# Patient Record
Sex: Female | Born: 1980 | Race: Black or African American | Hispanic: No | Marital: Single | Smoking: Never smoker
Health system: Southern US, Community
[De-identification: ages and names within clinical notes are randomized; demographics above are authoritative.]

## PROBLEM LIST (undated history)

## (undated) DIAGNOSIS — Z923 Personal history of irradiation: Secondary | ICD-10-CM

## (undated) DIAGNOSIS — E079 Disorder of thyroid, unspecified: Secondary | ICD-10-CM

## (undated) DIAGNOSIS — E0590 Thyrotoxicosis, unspecified without thyrotoxic crisis or storm: Secondary | ICD-10-CM

## (undated) DIAGNOSIS — E042 Nontoxic multinodular goiter: Secondary | ICD-10-CM

## (undated) DIAGNOSIS — Z8489 Family history of other specified conditions: Secondary | ICD-10-CM

## (undated) DIAGNOSIS — Z800 Family history of malignant neoplasm of digestive organs: Secondary | ICD-10-CM

## (undated) DIAGNOSIS — K219 Gastro-esophageal reflux disease without esophagitis: Secondary | ICD-10-CM

## (undated) DIAGNOSIS — Z9221 Personal history of antineoplastic chemotherapy: Secondary | ICD-10-CM

## (undated) HISTORY — DX: Thyrotoxicosis, unspecified without thyrotoxic crisis or storm: E05.90

## (undated) HISTORY — DX: Disorder of thyroid, unspecified: E07.9

## (undated) HISTORY — PX: WISDOM TOOTH EXTRACTION: SHX21

## (undated) HISTORY — DX: Family history of malignant neoplasm of digestive organs: Z80.0

---

## 2013-10-03 ENCOUNTER — Other Ambulatory Visit: Payer: Self-pay | Admitting: Internal Medicine

## 2013-10-03 ENCOUNTER — Other Ambulatory Visit (HOSPITAL_BASED_OUTPATIENT_CLINIC_OR_DEPARTMENT_OTHER): Payer: Self-pay | Admitting: Internal Medicine

## 2013-10-03 DIAGNOSIS — M25519 Pain in unspecified shoulder: Secondary | ICD-10-CM

## 2013-10-03 DIAGNOSIS — M25511 Pain in right shoulder: Secondary | ICD-10-CM

## 2013-10-04 ENCOUNTER — Ambulatory Visit (HOSPITAL_BASED_OUTPATIENT_CLINIC_OR_DEPARTMENT_OTHER)
Admission: RE | Admit: 2013-10-04 | Discharge: 2013-10-04 | Disposition: A | Discharge status: Home / Self Care | Payer: 59 | Source: Ambulatory Visit | Attending: Internal Medicine | Admitting: Internal Medicine

## 2013-10-04 ENCOUNTER — Other Ambulatory Visit (HOSPITAL_BASED_OUTPATIENT_CLINIC_OR_DEPARTMENT_OTHER): Payer: Self-pay | Admitting: Internal Medicine

## 2013-10-04 ENCOUNTER — Other Ambulatory Visit (HOSPITAL_BASED_OUTPATIENT_CLINIC_OR_DEPARTMENT_OTHER): Payer: Self-pay

## 2013-10-04 DIAGNOSIS — M25519 Pain in unspecified shoulder: Secondary | ICD-10-CM

## 2013-10-04 DIAGNOSIS — M719 Bursopathy, unspecified: Principal | ICD-10-CM | POA: Insufficient documentation

## 2013-10-04 DIAGNOSIS — M259 Joint disorder, unspecified: Secondary | ICD-10-CM | POA: Insufficient documentation

## 2013-10-04 DIAGNOSIS — R937 Abnormal findings on diagnostic imaging of other parts of musculoskeletal system: Secondary | ICD-10-CM | POA: Insufficient documentation

## 2013-10-04 DIAGNOSIS — M67919 Unspecified disorder of synovium and tendon, unspecified shoulder: Secondary | ICD-10-CM | POA: Insufficient documentation

## 2013-10-06 ENCOUNTER — Other Ambulatory Visit: Payer: Self-pay

## 2013-10-07 ENCOUNTER — Other Ambulatory Visit (HOSPITAL_BASED_OUTPATIENT_CLINIC_OR_DEPARTMENT_OTHER): Payer: 59

## 2013-12-08 ENCOUNTER — Other Ambulatory Visit: Payer: Self-pay | Admitting: Internal Medicine

## 2013-12-08 DIAGNOSIS — N63 Unspecified lump in unspecified breast: Secondary | ICD-10-CM

## 2013-12-11 ENCOUNTER — Ambulatory Visit
Admission: RE | Admit: 2013-12-11 | Discharge: 2013-12-11 | Disposition: A | Discharge status: Home / Self Care | Payer: 59 | Source: Ambulatory Visit | Attending: Internal Medicine | Admitting: Internal Medicine

## 2013-12-11 ENCOUNTER — Encounter (INDEPENDENT_AMBULATORY_CARE_PROVIDER_SITE_OTHER): Payer: Self-pay

## 2013-12-11 ENCOUNTER — Other Ambulatory Visit: Payer: Self-pay | Admitting: Internal Medicine

## 2013-12-11 DIAGNOSIS — N63 Unspecified lump in unspecified breast: Secondary | ICD-10-CM

## 2014-07-03 ENCOUNTER — Other Ambulatory Visit: Payer: Self-pay | Admitting: Obstetrics & Gynecology

## 2014-07-03 DIAGNOSIS — N6489 Other specified disorders of breast: Secondary | ICD-10-CM

## 2014-07-03 DIAGNOSIS — N6009 Solitary cyst of unspecified breast: Secondary | ICD-10-CM

## 2014-07-22 ENCOUNTER — Ambulatory Visit
Admission: RE | Admit: 2014-07-22 | Discharge: 2014-07-22 | Disposition: A | Discharge status: Home / Self Care | Payer: 59 | Source: Ambulatory Visit | Attending: Obstetrics & Gynecology | Admitting: Obstetrics & Gynecology

## 2014-07-22 DIAGNOSIS — N6489 Other specified disorders of breast: Secondary | ICD-10-CM

## 2014-07-22 DIAGNOSIS — N6009 Solitary cyst of unspecified breast: Secondary | ICD-10-CM

## 2015-02-19 ENCOUNTER — Other Ambulatory Visit: Payer: Self-pay | Admitting: Obstetrics & Gynecology

## 2015-02-19 DIAGNOSIS — N6310 Unspecified lump in the right breast, unspecified quadrant: Secondary | ICD-10-CM

## 2015-02-19 DIAGNOSIS — N6489 Other specified disorders of breast: Secondary | ICD-10-CM

## 2015-02-25 ENCOUNTER — Other Ambulatory Visit: Payer: 59

## 2015-03-12 ENCOUNTER — Ambulatory Visit
Admission: RE | Admit: 2015-03-12 | Discharge: 2015-03-12 | Disposition: A | Discharge status: Home / Self Care | Payer: 59 | Source: Ambulatory Visit | Attending: Obstetrics & Gynecology | Admitting: Obstetrics & Gynecology

## 2015-03-12 DIAGNOSIS — N6310 Unspecified lump in the right breast, unspecified quadrant: Secondary | ICD-10-CM

## 2015-03-12 DIAGNOSIS — N6489 Other specified disorders of breast: Secondary | ICD-10-CM

## 2015-07-12 DIAGNOSIS — M8589 Other specified disorders of bone density and structure, multiple sites: Secondary | ICD-10-CM | POA: Diagnosis not present

## 2015-07-12 DIAGNOSIS — E7800 Pure hypercholesterolemia, unspecified: Secondary | ICD-10-CM | POA: Diagnosis not present

## 2015-07-12 DIAGNOSIS — Z79899 Other long term (current) drug therapy: Secondary | ICD-10-CM | POA: Diagnosis not present

## 2015-07-12 DIAGNOSIS — F419 Anxiety disorder, unspecified: Secondary | ICD-10-CM | POA: Diagnosis not present

## 2015-07-12 DIAGNOSIS — E559 Vitamin D deficiency, unspecified: Secondary | ICD-10-CM | POA: Diagnosis not present

## 2015-08-06 MED FILL — VIT D2 1.25 MG (50,000 UNIT: 1.25 MG | 56 days supply | Qty: 8 | Fill #1

## 2015-08-06 MED FILL — IBUPROFEN 800 MG TABLET: 800 | 30 days supply | Qty: 90 | Fill #0

## 2015-10-04 DIAGNOSIS — H5213 Myopia, bilateral: Secondary | ICD-10-CM | POA: Diagnosis not present

## 2015-10-04 DIAGNOSIS — H40013 Open angle with borderline findings, low risk, bilateral: Secondary | ICD-10-CM | POA: Diagnosis not present

## 2016-01-10 DIAGNOSIS — D539 Nutritional anemia, unspecified: Secondary | ICD-10-CM | POA: Diagnosis not present

## 2016-01-10 DIAGNOSIS — R5383 Other fatigue: Secondary | ICD-10-CM | POA: Diagnosis not present

## 2016-01-10 DIAGNOSIS — R0602 Shortness of breath: Secondary | ICD-10-CM | POA: Diagnosis not present

## 2016-01-10 DIAGNOSIS — Z1389 Encounter for screening for other disorder: Secondary | ICD-10-CM | POA: Diagnosis not present

## 2016-01-10 DIAGNOSIS — Z0000 Encounter for general adult medical examination without abnormal findings: Secondary | ICD-10-CM | POA: Diagnosis not present

## 2016-01-10 DIAGNOSIS — R5381 Other malaise: Secondary | ICD-10-CM | POA: Diagnosis not present

## 2016-01-10 DIAGNOSIS — E559 Vitamin D deficiency, unspecified: Secondary | ICD-10-CM | POA: Diagnosis not present

## 2016-01-24 DIAGNOSIS — G4710 Hypersomnia, unspecified: Secondary | ICD-10-CM | POA: Diagnosis not present

## 2016-01-24 DIAGNOSIS — E7800 Pure hypercholesterolemia, unspecified: Secondary | ICD-10-CM | POA: Diagnosis not present

## 2016-01-24 DIAGNOSIS — G4726 Circadian rhythm sleep disorder, shift work type: Secondary | ICD-10-CM | POA: Diagnosis not present

## 2016-01-24 DIAGNOSIS — E559 Vitamin D deficiency, unspecified: Secondary | ICD-10-CM | POA: Diagnosis not present

## 2016-01-27 DIAGNOSIS — R0683 Snoring: Secondary | ICD-10-CM | POA: Diagnosis not present

## 2016-01-27 DIAGNOSIS — G4726 Circadian rhythm sleep disorder, shift work type: Secondary | ICD-10-CM | POA: Diagnosis not present

## 2016-01-27 DIAGNOSIS — F5102 Adjustment insomnia: Secondary | ICD-10-CM | POA: Diagnosis not present

## 2017-05-25 ENCOUNTER — Ambulatory Visit: Payer: BLUE CROSS/BLUE SHIELD | Admitting: Obstetrics & Gynecology

## 2017-05-25 ENCOUNTER — Encounter: Payer: Self-pay | Admitting: Obstetrics & Gynecology

## 2017-05-25 VITALS — BP 110/80

## 2017-05-25 DIAGNOSIS — N898 Other specified noninflammatory disorders of vagina: Secondary | ICD-10-CM

## 2017-05-25 LAB — WET PREP FOR TRICH, YEAST, CLUE

## 2017-05-25 MED ORDER — FLUCONAZOLE 150 MG PO TABS: 150.0000 mg | ORAL_TABLET | Freq: Every day | ORAL | 1 refills | Status: DC

## 2017-05-25 MED ORDER — TINIDAZOLE 500 MG PO TABS: 2.0000 g | ORAL_TABLET | Freq: Every day | ORAL | 0 refills | Status: AC

## 2017-05-25 NOTE — Progress Notes (Addendum)
    Nicole Maddox Aug 24, 1980 [id number]        36 y.o.  G0 Single  RP:  Vaginal itching x 3 days  HPI:  Not sexually active.  LMP normal 05/05/2017.  No pelvic pain.  C/O vaginal itching with vaginal d/c x 3 days.  Started after taking an [employer]  No odor.  No abnormal bleeding.  No UTI Sx.  BMs wnl.    Past medical history,surgical history, problem list, medications, allergies, family history and social history were all reviewed and documented in the EPIC chart.  Directed ROS with pertinent positives and negatives documented in the history of present illness/assessment and plan.  Exam:  Vitals:   05/25/17 1440  BP: 110/80   General appearance:  Normal  Gyn exam:  Vulva normal.  Speculum:  Cervix/vagina normal, but increased d/c Yeast like.  Wet prep done.   Assessment/Plan:  36 y.o. G0P0000   1. Vaginal itching Yeast vaginitis and Bacterial Vaginosis clinically and on Wet prep.  Treat with Tinidazole first, then Fluconazole 150 mg 1 tab daily x 3.  Usage/risks/benefits.  Probiotic tab vaginally as needed recommended.  F/U Annual/Gyn exam.    - WET PREP FOR [place], YEAST, CLUE  Other orders - fluconazole (DIFLUCAN) 150 MG tablet; Take 1 tablet (150 mg total) by mouth daily for 3 days.  Counseling on above issues >50% x 15 minutes.  [name] MD, 2:46 PM 05/25/2017

## 2017-05-25 NOTE — Addendum Note (Signed)
Addended by: [name] on: 05/25/2017 03:10 PM   Modules accepted: Orders

## 2017-05-25 NOTE — Patient Instructions (Addendum)
1. Vaginal itching Yeast vaginitis and Bacterial vaginosis clinically and on Wet prep.  Treat withTinidazole first, then Fluconazole 150 mg 1 tab daily x 3.  Usage/risks/benefits.  Probiotic tab vaginally as needed recommended.  F/U Annual/Gyn exam.    - WET PREP FOR [place], YEAST, CLUE  Other orders - fluconazole (DIFLUCAN) 150 MG tablet; Take 1 tablet (150 mg total) by mouth daily for 3 days.  Nicole Maddox, it was a pleasure seeing you today!  Bacterial Vaginosis Bacterial vaginosis is a vaginal infection that occurs when the normal balance of bacteria in the vagina is disrupted. It results from an overgrowth of certain bacteria. This is the most common vaginal infection among women ages 47-44. Because bacterial vaginosis increases your risk for STIs (sexually transmitted infections), getting treated can help reduce your risk for chlamydia, gonorrhea, herpes, and HIV (human immunodeficiency virus). Treatment is also important for preventing complications in pregnant women, because this condition can cause an early (premature) delivery. What are the causes? This condition is caused by an increase in harmful bacteria that are normally present in small amounts in the vagina. However, the reason that the condition develops is not fully understood. What increases the risk? The following factors may make you more likely to develop this condition:  Having a new sexual partner or multiple sexual partners.  Having unprotected sex.  Douching.  Having an intrauterine device (IUD).  Smoking.  Drug and alcohol abuse.  Taking certain antibiotic medicines.  Being pregnant.  You cannot get bacterial vaginosis from toilet seats, bedding, swimming pools, or contact with objects around you. What are the signs or symptoms? Symptoms of this condition include:  Grey or white vaginal discharge. The discharge can also be watery or foamy.  A fish-like odor with discharge, especially after sexual  intercourse or during menstruation.  Itching in and around the vagina.  Burning or pain with urination.  Some women with bacterial vaginosis have no signs or symptoms. How is this diagnosed? This condition is diagnosed based on:  Your medical history.  A physical exam of the vagina.  Testing a sample of vaginal fluid under a microscope to look for a large amount of bad bacteria or abnormal cells. Your health care provider may use a cotton swab or a small wooden spatula to collect the sample.  How is this treated? This condition is treated with antibiotics. These may be given as a pill, a vaginal cream, or a medicine that is put into the vagina (suppository). If the condition comes back after treatment, a second round of antibiotics may be needed. Follow these instructions at home: Medicines  Take over-the-counter and prescription medicines only as told by your health care provider.  Take or use your antibiotic as told by your health care provider. Do not stop taking or using the antibiotic even if you start to feel better. General instructions  If you have a female sexual partner, tell her that you have a vaginal infection. She should see her health care provider and be treated if she has symptoms. If you have a female sexual partner, he does not need treatment.  During treatment: ? Avoid sexual activity until you finish treatment. ? Do not douche. ? Avoid alcohol as directed by your health care provider. ? Avoid breastfeeding as directed by your health care provider.  Drink enough water and fluids to keep your urine clear or pale yellow.  Keep the area around your vagina and rectum clean. ? Wash the area daily with warm  water. ? Wipe yourself from front to back after using the toilet.  Keep all follow-up visits as told by your health care provider. This is important. How is this prevented?  Do not douche.  Wash the outside of your vagina with warm water only.  Use  protection when having sex. This includes latex condoms and dental dams.  Limit how many sexual partners you have. To help prevent bacterial vaginosis, it is best to have sex with just one partner (monogamous).  Make sure you and your sexual partner are tested for STIs.  Wear cotton or cotton-lined underwear.  Avoid wearing tight pants and pantyhose, especially during summer.  Limit the amount of alcohol that you drink.  Do not use any products that contain nicotine or tobacco, such as cigarettes and e-cigarettes. If you need help quitting, ask your health care provider.  Do not use illegal drugs. Where to find more information:  Centers for Disease Control and Prevention: AppraiserFraud.fi  American Sexual Health Association (ASHA): www.ashastd.org  [place].S. Department of Health and Financial controller, Office on Women's Health: DustingSprays.pl or SecuritiesCard.it Contact a health care provider if:  Your symptoms do not improve, even after treatment.  You have more discharge or pain when urinating.  You have a fever.  You have pain in your abdomen.  You have pain during sex.  You have vaginal bleeding between periods. Summary  Bacterial vaginosis is a vaginal infection that occurs when the normal balance of bacteria in the vagina is disrupted.  Because bacterial vaginosis increases your risk for STIs (sexually transmitted infections), getting treated can help reduce your risk for chlamydia, gonorrhea, herpes, and HIV (human immunodeficiency virus). Treatment is also important for preventing complications in pregnant women, because the condition can cause an early (premature) delivery.  This condition is treated with antibiotic medicines. These may be given as a pill, a vaginal cream, or a medicine that is put into the vagina (suppository). This information is not intended to replace advice given to you by your health care provider. Make  sure you discuss any questions you have with your health care provider. Document Released: 05/22/2005 Document Revised: 09/25/2016 Document Reviewed: 02/05/2016 Elsevier Interactive Patient Education  2018 [employer].   Vaginal Yeast infection, Adult Vaginal yeast infection is a condition that causes soreness, swelling, and redness (inflammation) of the vagina. It also causes vaginal discharge. This is a common condition. Some women get this infection frequently. What are the causes? This condition is caused by a change in the normal balance of the yeast (candida) and bacteria that live in the vagina. This change causes an overgrowth of yeast, which causes the inflammation. What increases the risk? This condition is more likely to develop in:  Women who take antibiotic medicines.  Women who have diabetes.  Women who take birth control pills.  Women who are pregnant.  Women who douche often.  Women who have a weak defense (immune) system.  Women who have been taking steroid medicines for a long time.  Women who frequently wear tight clothing.  What are the signs or symptoms? Symptoms of this condition include:  White, thick vaginal discharge.  Swelling, itching, redness, and irritation of the vagina. The lips of the vagina (vulva) may be affected as well.  Pain or a burning feeling while urinating.  Pain during sex.  How is this diagnosed? This condition is diagnosed with a medical history and physical exam. This will include a pelvic exam. Your health care provider will examine  a sample of your vaginal discharge under a microscope. Your health care provider may send this sample for testing to confirm the diagnosis. How is this treated? This condition is treated with medicine. Medicines may be over-the-counter or prescription. You may be told to use one or more of the following:  Medicine that is taken orally.  Medicine that is applied as a cream.  Medicine that is  inserted directly into the vagina (suppository).  Follow these instructions at home:  Take or apply over-the-counter and prescription medicines only as told by your health care provider.  Do not have sex until your health care provider has approved. Tell your sex partner that you have a yeast infection. That person should go to his or her health care provider if he or she develops symptoms.  Do not wear tight clothes, such as pantyhose or tight pants.  Avoid using tampons until your health care provider approves.  Eat more yogurt. This may help to keep your yeast infection from returning.  Try taking a sitz bath to help with discomfort. This is a warm water bath that is taken while you are sitting down. The water should only come up to your hips and should cover your buttocks. Do this 3-4 times per day or as told by your health care provider.  Do not douche.  Wear breathable, cotton underwear.  If you have diabetes, keep your blood sugar levels under control. Contact a health care provider if:  You have a fever.  Your symptoms go away and then return.  Your symptoms do not get better with treatment.  Your symptoms get worse.  You have new symptoms.  You develop blisters in or around your vagina.  You have blood coming from your vagina and it is not your menstrual period.  You develop pain in your abdomen. This information is not intended to replace advice given to you by your health care provider. Make sure you discuss any questions you have with your health care provider. Document Released: 03/01/2005 Document Revised: 11/03/2015 Document Reviewed: 11/23/2014 Elsevier Interactive Patient Education  2018 [employer].

## 2017-08-31 IMAGING — MG MM DIAG BREAST TOMO BILATERAL
8 series · 8 of 24 positions shown · non-contrast
Comparison: Previous exam(s).

CLINICAL DATA: Six-month follow-up for probably benign asymmetries
within each breast, in order to show 1 year stability.

Also six-month follow-up for probably benign cysts in the right
breast, in order to show 1 year stability as well.
EXAM:
DIGITAL DIAGNOSTIC BILATERAL MAMMOGRAM WITH 3D TOMOSYNTHESIS WITH
CAD
ULTRASOUND RIGHT BREAST

[L CC]
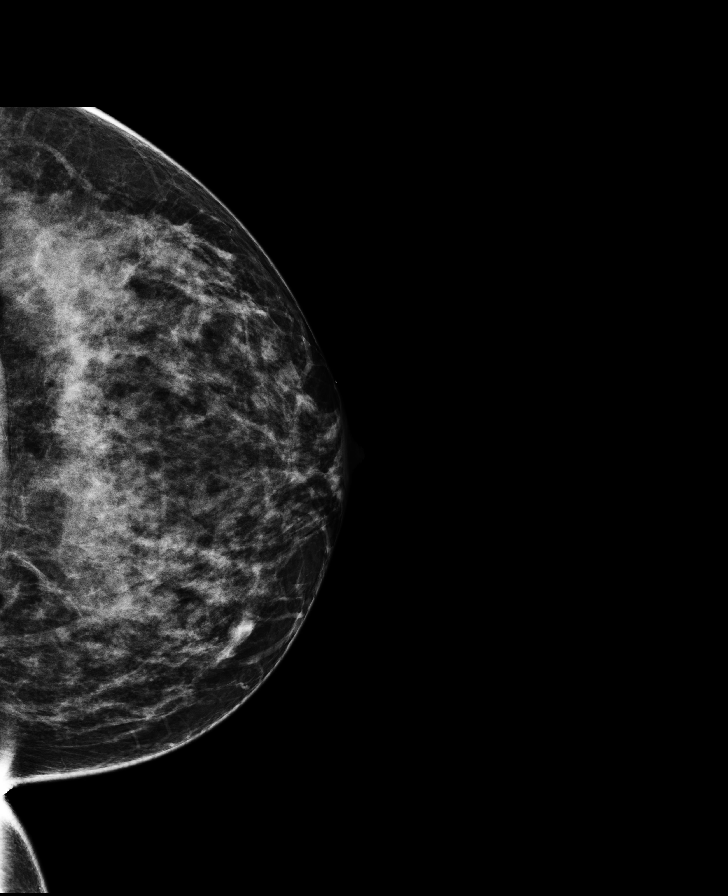

[R MLO]
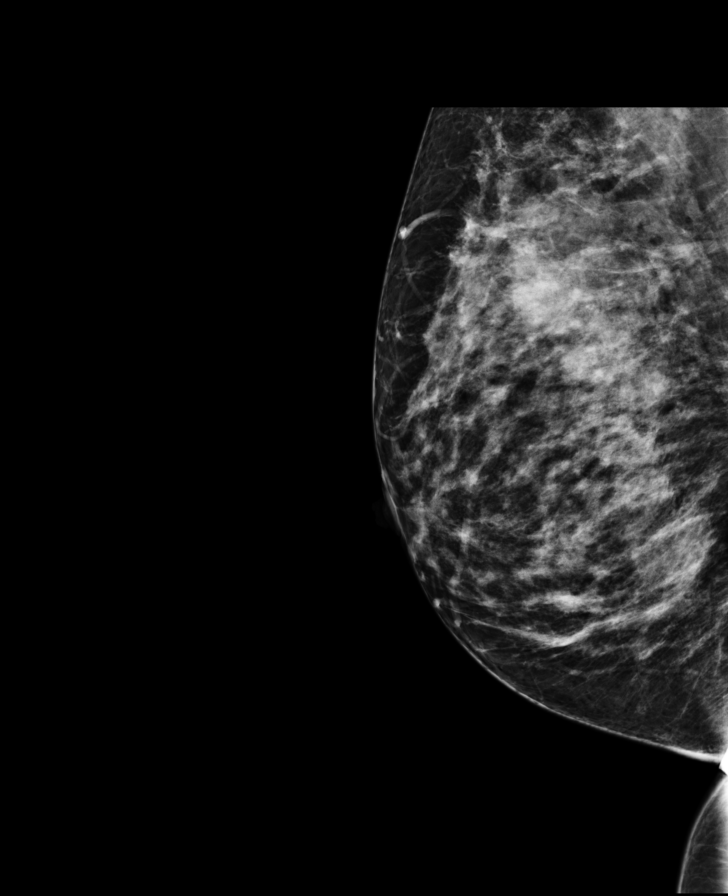

[R CC]
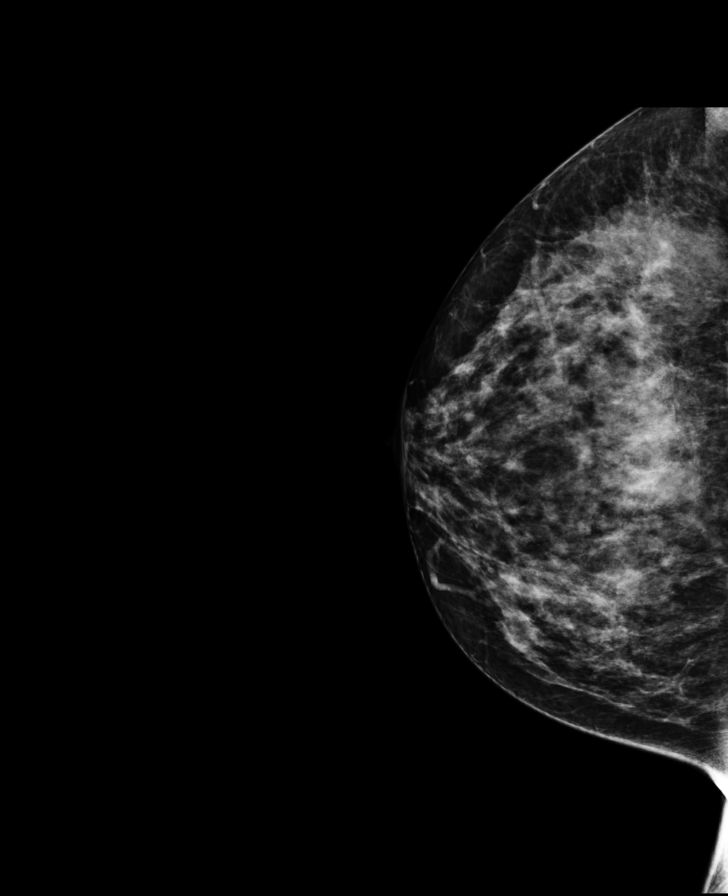

[L MLO]
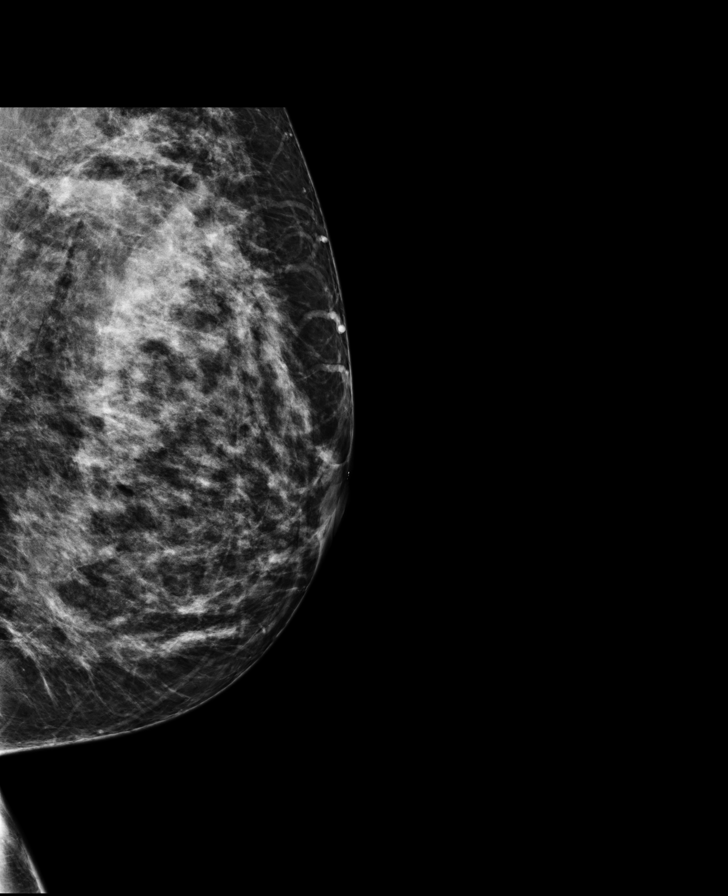

[L CC tomo · tomo slice 44/87.0]
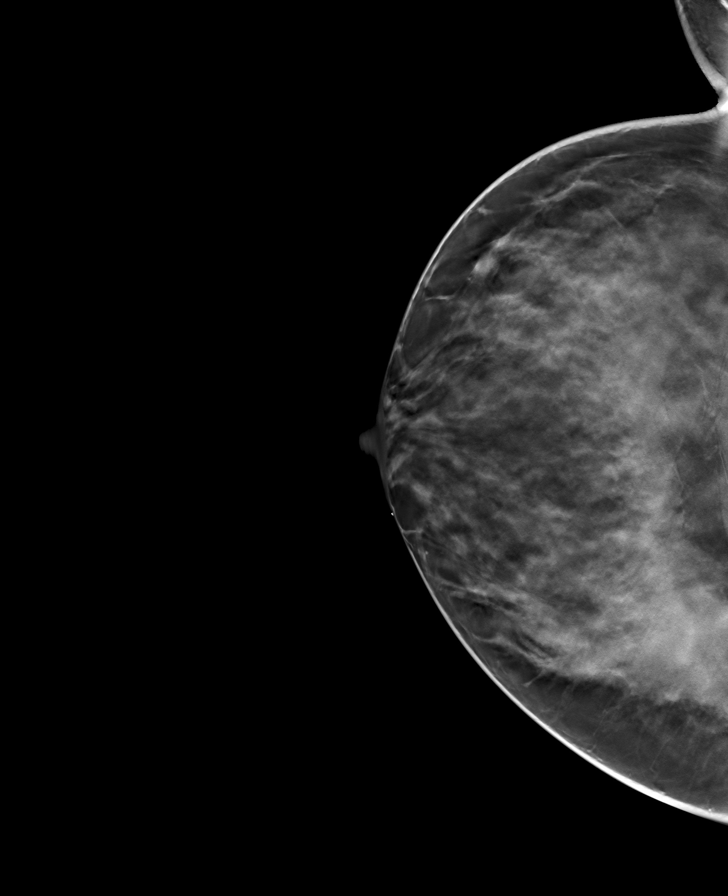

[R MLO tomo · tomo slice 43/84.0]
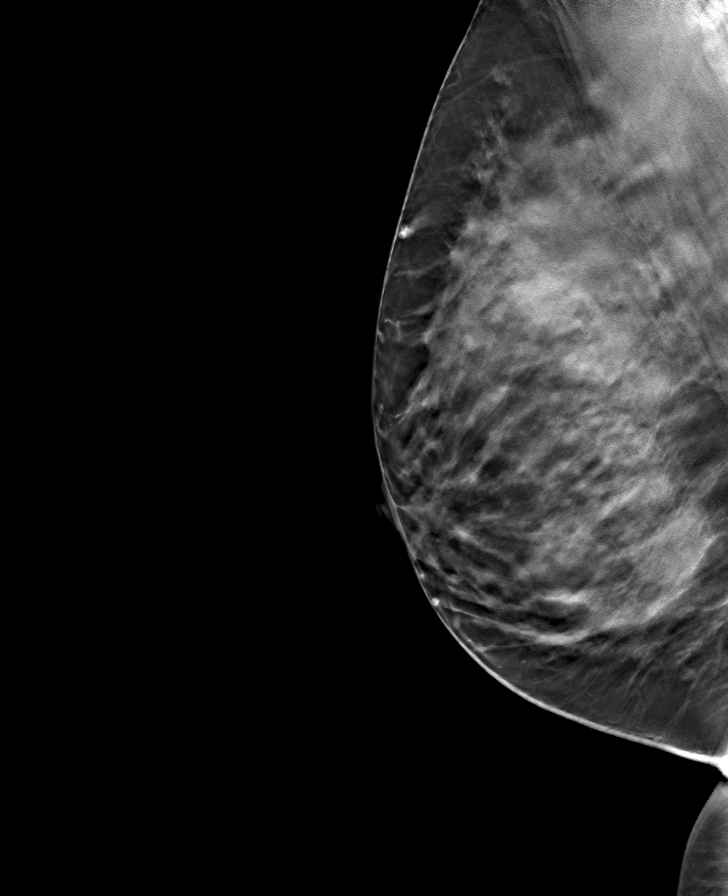

[L MLO tomo · tomo slice 44/87.0]
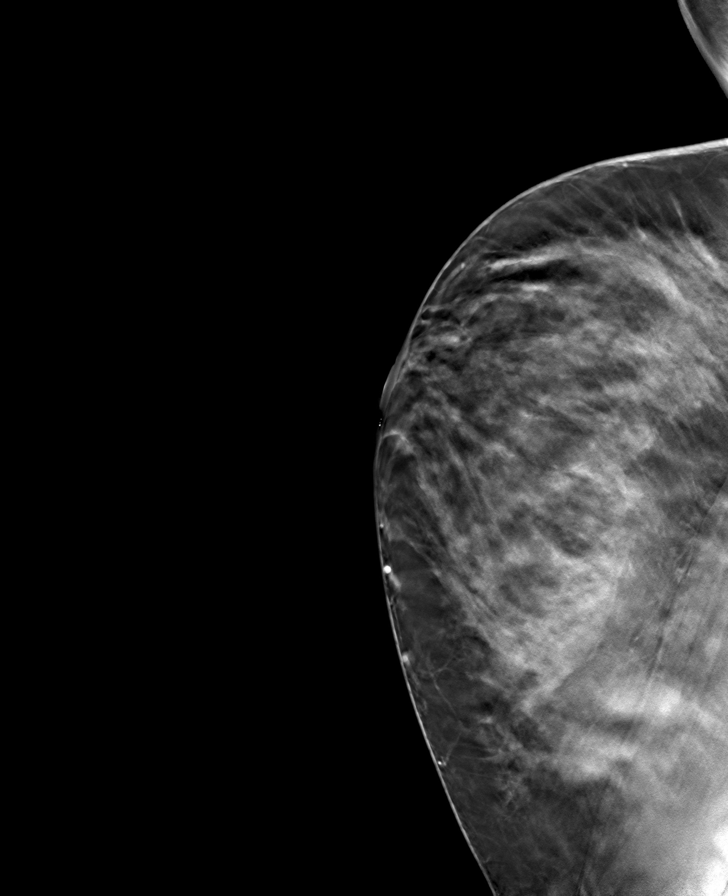

[R CC tomo · tomo slice 45/89.0]
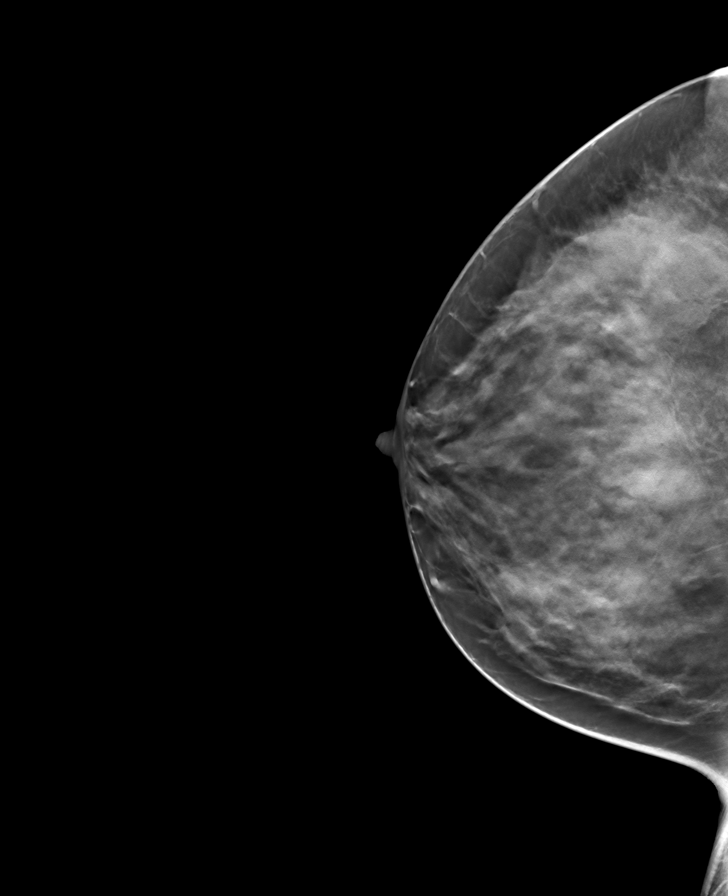

[8 of 24 positions shown; findings below may reference images not displayed]

ACR Breast Density Category c: The breast tissue is heterogeneously
dense, which may obscure small masses.
FINDINGS: There are no new dominant masses, suspicious calcifications or
secondary signs of malignancy identified in either breast. The
previously described bilateral breast asymmetries have an appearance
on today's 3D tomosynthesis that are compatible with superimposition
of normal dense fibroglandular tissues.

Mammographic images were processed with CAD.

Targeted ultrasound is performed, showing a stable benign cluster
cysts within the right breast at the 7 o'clock axis, 5 cm from the
nipple, measuring 1.6 x 0.6 x 0.9 cm on today's study. An additional
benign cluster of cysts is identified at the 9 o'clock axis, 5 cm
from the nipple, measuring 1.4 x 0.7 x 1.1 cm.

Multiple additional benign cysts are seen within the upper inner
quadrant of the right breast, 1-2 o'clock axes, largest measuring
0.8 cm.
IMPRESSION: No mammographic evidence of malignancy within either breast. No
sonographic evidence of malignancy in the right breast.

Multiple benign cysts within the right breast.

RECOMMENDATION:
Screening mammogram at age 40 unless there are persistent or
intervening clinical concerns. (Code:[id number])

I have discussed the findings and recommendations with the patient.
Results were also provided in writing at the conclusion of the
visit. If applicable, a reminder letter will be sent to the patient
regarding the next appointment.

BI-RADS CATEGORY  2: Benign.

## 2018-01-16 ENCOUNTER — Encounter: Payer: BLUE CROSS/BLUE SHIELD | Admitting: Obstetrics & Gynecology

## 2018-02-06 ENCOUNTER — Other Ambulatory Visit: Payer: Self-pay | Admitting: Obstetrics & Gynecology

## 2018-02-06 NOTE — Telephone Encounter (Signed)
CE scheduled 04/02/18 with Dr. Marguerita Merles.

## 2018-02-25 DIAGNOSIS — L219 Seborrheic dermatitis, unspecified: Secondary | ICD-10-CM | POA: Insufficient documentation

## 2018-03-12 ENCOUNTER — Ambulatory Visit (INDEPENDENT_AMBULATORY_CARE_PROVIDER_SITE_OTHER): Payer: BLUE CROSS/BLUE SHIELD | Admitting: Obstetrics & Gynecology

## 2018-03-12 ENCOUNTER — Encounter: Payer: Self-pay | Admitting: Obstetrics & Gynecology

## 2018-03-12 VITALS — BP 126/84

## 2018-03-12 DIAGNOSIS — N898 Other specified noninflammatory disorders of vagina: Secondary | ICD-10-CM | POA: Diagnosis not present

## 2018-03-12 DIAGNOSIS — Z113 Encounter for screening for infections with a predominantly sexual mode of transmission: Secondary | ICD-10-CM

## 2018-03-12 DIAGNOSIS — L292 Pruritus vulvae: Secondary | ICD-10-CM | POA: Diagnosis not present

## 2018-03-12 LAB — WET PREP FOR TRICH, YEAST, CLUE

## 2018-03-12 MED ORDER — FLUCONAZOLE 150 MG PO TABS: 150.0000 mg | ORAL_TABLET | Freq: Every day | ORAL | 3 refills | Status: AC

## 2018-03-12 NOTE — Progress Notes (Signed)
    Nicole Maddox 05-02-1981 [id number]        37 y.o.  G0P0000   RP: Vulvar itching/irritation post [place] wax  HPI: Menstrual periods with light flow every months.  No breakthrough bleeding.  No pelvic pain.  Abstinent.  Complains of vulvar irritation and itching after a [place] wax.  Thick vaginal discharge.  No fever.   OB History  Gravida Para Term Preterm AB Living  0 0 0 0 0 0  SAB TAB Ectopic Multiple Live Births  0 0 0 0 0    Past medical history,surgical history, problem list, medications, allergies, family history and social history were all reviewed and documented in the EPIC chart.   Directed ROS with pertinent positives and negatives documented in the history of present illness/assessment and plan.  Exam:  Vitals:   03/12/18 1457  BP: 126/84   General appearance:  Normal  Abdomen: Normal  Gynecologic exam:  Vulva normal except for vaginal discharge visible at vulva.  Speculum:  Cervix/Vagina normal, but heavy yeasty discharge.  Wet prep done.  Gono-Chlam done on cervix.   Assessment/Plan:  37 y.o. G0   1. Vaginal discharge Severe yeast vaginitis.  Treat with Fluconazole.  Fluconazole 150 mg 1 tablet daily for 3 days prescribed.  Usage reviewed and prescription sent to pharmacy. - WET PREP FOR [place], YEAST, CLUE  2. Vulvar itching No irritation or inflammation secondary to [place] wax at this time.  Vulvar itching probably due to Yeast Vaginitis.  Treatment as above. - WET PREP FOR [place], YEAST, CLUE  3. Screen for STD (sexually transmitted disease) Condom use - Gono-Chlam  Other orders - fluconazole (DIFLUCAN) 150 MG tablet; Take 1 tablet (150 mg total) by mouth daily for 3 days. Will treat with 1 tab daily x 3, then 1 tab weekly x 3.  Counseling on above issues and coordination of care more than 50% for 15 minutes.  [name] MD, 3:03 PM 03/12/2018

## 2018-03-13 LAB — C. TRACHOMATIS/N. GONORRHOEAE RNA
C. TRACHOMATIS RNA, TMA: NOT DETECTED
N. GONORRHOEAE RNA, TMA: NOT DETECTED

## 2018-03-17 ENCOUNTER — Encounter: Payer: Self-pay | Admitting: Obstetrics & Gynecology

## 2018-03-17 NOTE — Patient Instructions (Signed)
1. Vaginal discharge Severe yeast vaginitis.  Treat with Fluconazole.  Fluconazole 150 mg 1 tablet daily for 3 days prescribed.  Usage reviewed and prescription sent to pharmacy. - WET PREP FOR [place], YEAST, CLUE  2. Vulvar itching No irritation or inflammation secondary to [place] wax at this time.  Vulvar itching probably due to Yeast Vaginitis.  Treatment as above. - WET PREP FOR [place], YEAST, CLUE  3. Screen for STD (sexually transmitted disease) Condom use - Gono-Chlam  Other orders - fluconazole (DIFLUCAN) 150 MG tablet; Take 1 tablet (150 mg total) by mouth daily for 3 days. Will treat with 1 tab daily x 3, then 1 tab weekly x 3.  Kriste Basque, good seeing you today!  I will inform you of your results as soon as they are available.

## 2018-04-02 ENCOUNTER — Encounter: Payer: Self-pay | Admitting: Obstetrics & Gynecology

## 2018-04-02 ENCOUNTER — Ambulatory Visit (INDEPENDENT_AMBULATORY_CARE_PROVIDER_SITE_OTHER): Payer: BLUE CROSS/BLUE SHIELD | Admitting: Obstetrics & Gynecology

## 2018-04-02 VITALS — BP 122/80 | Ht 64.0 in | Wt 153.0 lb

## 2018-04-02 DIAGNOSIS — Z01419 Encounter for gynecological examination (general) (routine) without abnormal findings: Secondary | ICD-10-CM | POA: Diagnosis not present

## 2018-04-02 DIAGNOSIS — Z1151 Encounter for screening for human papillomavirus (HPV): Secondary | ICD-10-CM | POA: Diagnosis not present

## 2018-04-02 DIAGNOSIS — Z789 Other specified health status: Secondary | ICD-10-CM

## 2018-04-02 DIAGNOSIS — D219 Benign neoplasm of connective and other soft tissue, unspecified: Secondary | ICD-10-CM

## 2018-04-02 DIAGNOSIS — Z113 Encounter for screening for infections with a predominantly sexual mode of transmission: Secondary | ICD-10-CM

## 2018-04-02 NOTE — Patient Instructions (Signed)
1. Encounter for routine gynecological examination with Papanicolaou smear of cervix Gynecologic exam with probable uterine fibroids.  Pap test/high risk HPV done.  Breast exam normal.  Body mass index 26.26.  Physically active and good nutrition.  2. Use of condoms for contraception  3. Screen for STD (sexually transmitted disease) Strict condom use recommended - Gono-Chlam - HIV antibody (with reflex) - Hepatitis C Antibody - Hepatitis B Surface AntiGEN - RPR  4. Fibroids Uterine fibroids on gynecologic exam.  No pelvic pain and normal menstrual flow.  Will do a pelvic ultrasound to evaluate the size and location of the uterine fibroids, as well as evaluate the ovaries. - US Transvaginal Non-OB; Future  Nicole Maddox, it was a pleasure seeing you today!  I will inform you of your results as soon as they are available.

## 2018-04-02 NOTE — Progress Notes (Signed)
Nicole Maddox [birth year] [id number]   History:    37 y.o. G0 Single  RP:  Established patient presenting for annual gyn exam   HPI: Menses regular normal every month.  Tends to have yeast vaginitis post menses.  Taking Fluconazole as needed.  No pelvic pain.  Using condoms as needed. Would like STI screen. Breasts normal.  BMI 26.26.  Exercises regularly.    Past medical history,surgical history, family history and social history were all reviewed and documented in the EPIC chart.  Gynecologic History Patient's last menstrual period was 03/23/2018. Contraception: condoms Last Pap: 2017. Results were: normal per patient Last mammogram: 03/2015. Results were: Benign Bone Density: Never Colonoscopy: Never  Obstetric History OB History  Gravida Para Term Preterm AB Living  0 0 0 0 0 0  SAB TAB Ectopic Multiple Live Births  0 0 0 0 0     ROS: A ROS was performed and pertinent positives and negatives are included in the history.  GENERAL: No fevers or chills. HEENT: No change in vision, no earache, sore throat or sinus congestion. NECK: No pain or stiffness. CARDIOVASCULAR: No chest pain or pressure. No palpitations. PULMONARY: No shortness of breath, cough or wheeze. GASTROINTESTINAL: No abdominal pain, nausea, vomiting or diarrhea, melena or bright red blood per rectum. GENITOURINARY: No urinary frequency, urgency, hesitancy or dysuria. MUSCULOSKELETAL: No joint or muscle pain, no back pain, no recent trauma. DERMATOLOGIC: No rash, no itching, no lesions. ENDOCRINE: No polyuria, polydipsia, no heat or cold intolerance. No recent change in weight. HEMATOLOGICAL: No anemia or easy bruising or bleeding. NEUROLOGIC: No headache, seizures, numbness, tingling or weakness. PSYCHIATRIC: No depression, no loss of interest in normal activity or change in sleep pattern.     Exam:   BP 122/80 (BP Location: Right Arm, Patient Position: Sitting, Cuff Size: Normal)   Ht 5\' 4"  (1.626 m)   Wt  153 lb (69.4 kg)   LMP 03/23/2018   BMI 26.26 kg/m   Body mass index is 26.26 kg/m.  General appearance : Well developed well nourished female. No acute distress HEENT: Eyes: no retinal hemorrhage or exudates,  Neck supple, trachea midline, no carotid bruits, no thyroidmegaly Lungs: Clear to auscultation, no rhonchi or wheezes, or rib retractions  Heart: Regular rate and rhythm, no murmurs or gallops Breast:Examined in sitting and supine position were symmetrical in appearance, no palpable masses or tenderness,  no skin retraction, no nipple inversion, no nipple discharge, no skin discoloration, no axillary or supraclavicular lymphadenopathy Abdomen: no palpable masses or tenderness, no rebound or guarding Extremities: no edema or skin discoloration or tenderness  Pelvic: Vulva: Normal             Vagina: No gross lesions or discharge  Cervix: No gross lesions or discharge.  Pap/HPV HR/Gono-chlam done  Uterus  AV, mild increase in size and nodular, non-tender and mobile  Adnexa  Without masses or tenderness  Anus: Normal   Assessment/Plan:  37 y.o. female for annual exam   1. Encounter for routine gynecological examination with Papanicolaou smear of cervix Gynecologic exam with probable uterine fibroids.  Pap test/high risk HPV done.  Breast exam normal.  Body mass index 26.26.  Physically active and good nutrition.  2. Use of condoms for contraception  3. Screen for STD (sexually transmitted disease) Strict condom use recommended - Gono-Chlam - HIV antibody (with reflex) - Hepatitis C Antibody - Hepatitis B Surface AntiGEN - RPR  4. Fibroids Uterine fibroids on gynecologic exam.  No pelvic pain and normal menstrual flow.  Will do a pelvic ultrasound to evaluate the size and location of the uterine fibroids, as well as evaluate the ovaries. - US Transvaginal Non-OB; Future  Counseling on above issues and coordination of care more than 50% for 10 minutes.  [name] MD, 12:21 PM 04/02/2018

## 2018-04-03 LAB — RPR: RPR Ser Ql: NONREACTIVE

## 2018-04-03 LAB — PAP IG, CT-NG NAA, HPV HIGH-RISK
C. trachomatis RNA, TMA: NOT DETECTED
HPV DNA High Risk: NOT DETECTED
N. gonorrhoeae RNA, TMA: NOT DETECTED

## 2018-04-03 LAB — HEPATITIS B SURFACE ANTIGEN: Hepatitis B Surface Ag: NONREACTIVE

## 2018-04-03 LAB — HIV ANTIBODY (ROUTINE TESTING W REFLEX): HIV 1&2 Ab, 4th Generation: NONREACTIVE

## 2018-04-03 LAB — HEPATITIS C ANTIBODY
HEP C AB: NONREACTIVE
SIGNAL TO CUT-OFF: 0.01 (ref <1.00)

## 2018-04-23 ENCOUNTER — Other Ambulatory Visit: Payer: Self-pay | Admitting: Obstetrics & Gynecology

## 2018-04-23 ENCOUNTER — Ambulatory Visit: Payer: BLUE CROSS/BLUE SHIELD

## 2018-04-23 ENCOUNTER — Ambulatory Visit (INDEPENDENT_AMBULATORY_CARE_PROVIDER_SITE_OTHER): Payer: BLUE CROSS/BLUE SHIELD | Admitting: Obstetrics & Gynecology

## 2018-04-23 DIAGNOSIS — D219 Benign neoplasm of connective and other soft tissue, unspecified: Secondary | ICD-10-CM

## 2018-04-23 DIAGNOSIS — N838 Other noninflammatory disorders of ovary, fallopian tube and broad ligament: Secondary | ICD-10-CM | POA: Diagnosis not present

## 2018-04-23 DIAGNOSIS — D251 Intramural leiomyoma of uterus: Secondary | ICD-10-CM

## 2018-04-23 DIAGNOSIS — N83202 Unspecified ovarian cyst, left side: Secondary | ICD-10-CM

## 2018-04-23 NOTE — Progress Notes (Signed)
    Nicole Maddox [birth year] [id number]        37 y.o.  G0 single  RP: Fibroids for pelvic ultrasound  HPI: Normal menstrual periods every month with no breakthrough bleeding.  No pelvic pain.  Using condoms for contraception as needed.  Gynecologic exam revealed an enlarged nodular uterus compatible with uterine fibroids.   OB History  Gravida Para Term Preterm AB Living  0 0 0 0 0 0  SAB TAB Ectopic Multiple Live Births  0 0 0 0 0    Past medical history,surgical history, problem list, medications, allergies, family history and social history were all reviewed and documented in the EPIC chart.   Directed ROS with pertinent positives and negatives documented in the history of present illness/assessment and plan.  Exam: Her  There were no vitals filed for this visit. General appearance:  Normal  Pelvic US today: T/V and T/a images.  Anteverted uterus measuring 10.67 x 6.43 x 5.27 cm.  Right intramural fibroid with calcification measuring 3.6 x 2.8 x 2.9 cm.  Endometrial lining normal tri-layered measuring 12.8 mm.  Right ovary normal.  Left ovary with a cluster of 3 echo-free avascular cysts measuring 4.8 x 3.3 x 4.0 cm, 4.4 x 4.1 x 4.1 cm, and 2.1 x 2.0 cm.  Does not appear like a multiloculated cyst, but rather as 3 separate cysts.  No free fluid in the posterior cul-de-sac.   Assessment/Plan:  37 y.o. G0  1. Intramural leiomyoma of uterus Asymptomatic intramural fibroid measuring 3.6 x 2.8 x 2.9 cm.  Patient reassured.  Will observe.  2. Left ovarian cyst Probably 3 simple ovarian cysts on the left ovary measuring from 2.1 to 4.8 cm.  No pelvic pain.  Patient reassured.  Will follow up with a pelvic ultrasound in 3 months to assure stability or regression. - US Transvaginal Non-OB; Future  Counseling on above issues and coordination of care more than 50% for 15 minutes.  [name] MD, 2:42 PM 04/23/2018

## 2018-04-29 ENCOUNTER — Encounter: Payer: Self-pay | Admitting: Obstetrics & Gynecology

## 2018-04-29 NOTE — Patient Instructions (Signed)
1. Intramural leiomyoma of uterus Asymptomatic intramural fibroid measuring 3.6 x 2.8 x 2.9 cm.  Patient reassured.  Will observe.  2. Left ovarian cyst Probably 3 simple ovarian cysts on the left ovary measuring from 2.1 to 4.8 cm.  No pelvic pain.  Patient reassured.  Will follow up with a pelvic ultrasound in 3 months to assure stability or regression. - US Transvaginal Non-OB; Future  Nicole Maddox, it was a pleasure seeing you today!

## 2018-05-16 ENCOUNTER — Other Ambulatory Visit: Payer: Self-pay | Admitting: Obstetrics & Gynecology

## 2018-06-12 ENCOUNTER — Telehealth: Payer: Self-pay | Admitting: *Deleted

## 2018-06-12 NOTE — Telephone Encounter (Signed)
Patient was prescribed diflucan 150 mg tablet directions take once tablet daily x 3 days then once a week. But only 3 tablets were prescribed. How long do you want her to take the 1 table once weekly? Please advise

## 2018-06-13 MED ORDER — FLUCONAZOLE 150 MG PO TABS: ORAL_TABLET | ORAL | 3 refills | Status: DC

## 2018-06-13 NOTE — Telephone Encounter (Signed)
Patient informed. Rx sent 

## 2018-06-13 NOTE — Telephone Encounter (Signed)
Fluconazole weekly x 4.

## 2018-07-24 ENCOUNTER — Other Ambulatory Visit: Payer: BLUE CROSS/BLUE SHIELD

## 2018-07-24 ENCOUNTER — Ambulatory Visit: Payer: BLUE CROSS/BLUE SHIELD | Admitting: Obstetrics & Gynecology

## 2018-08-14 ENCOUNTER — Other Ambulatory Visit: Payer: Self-pay

## 2018-08-14 ENCOUNTER — Encounter: Payer: Self-pay | Admitting: Obstetrics & Gynecology

## 2018-08-14 ENCOUNTER — Ambulatory Visit (INDEPENDENT_AMBULATORY_CARE_PROVIDER_SITE_OTHER): Payer: BLUE CROSS/BLUE SHIELD

## 2018-08-14 ENCOUNTER — Ambulatory Visit (INDEPENDENT_AMBULATORY_CARE_PROVIDER_SITE_OTHER): Payer: BLUE CROSS/BLUE SHIELD | Admitting: Obstetrics & Gynecology

## 2018-08-14 DIAGNOSIS — D251 Intramural leiomyoma of uterus: Secondary | ICD-10-CM | POA: Diagnosis not present

## 2018-08-14 DIAGNOSIS — N83202 Unspecified ovarian cyst, left side: Secondary | ICD-10-CM

## 2018-08-14 NOTE — Progress Notes (Signed)
    Nicole Maddox 1980/11/01 [id number]        38 y.o.  G0P0000 single  RP: Pelvic ultrasound to follow-up on the uterine fibroid and left ovarian cyst  HPI: Patient is completely asymptomatic with normal menstrual periods and no pelvic pain.   OB History  Gravida Para Term Preterm AB Living  0 0 0 0 0 0  SAB TAB Ectopic Multiple Live Births  0 0 0 0 0    Past medical history,surgical history, problem list, medications, allergies, family history and social history were all reviewed and documented in the EPIC chart.   Directed ROS with pertinent positives and negatives documented in the history of present illness/assessment and plan.  Exam:  There were no vitals filed for this visit. General appearance:  Normal  Pelvic US today: T/V and T/a images.  Anteverted uterus measuring 8.88 x 6.63 x 4.7 cm.  Intramural fibroid with no change since last ultrasound in November 2019, showing calcifications measuring 3.1 x 3.3 cm.  Endometrial lining tri-layered normal at 10.6 mm.  Right ovary normal.  Left ovary with 3 echo-free avascular cysts measuring 4.7 x 3.7 x 4.5 cm, 2.1 x 2.0 x 1.9 cm and 4.3 x 4.5 x 3.0 cm, completely stable compared to the ultrasound in November 2019.  Color flow Doppler positive to the ovarian tissue.  No free fluid in the posterior cul-de-sac.   Assessment/Plan:  38 y.o. G0P0000   1. Intramural uterine fibroid Pelvic ultrasound findings reviewed with patient.  Reassured that the intramural fibroid is completely stable compared to the ultrasound in November 2019.  2. Left ovarian cyst 3 simple stable as symptomatic left ovarian cyst.  Patient reassured, will observe.  Follow-up annual gynecologic exam.  Counseling on above issues and coordination of care more than 50% for 15 minutes.  [name] MD, 9:19 AM 08/14/2018

## 2018-08-14 NOTE — Patient Instructions (Signed)
1. Intramural uterine fibroid Pelvic ultrasound findings reviewed with patient.  Reassured that the intramural fibroid is completely stable compared to the ultrasound in November 2019.  2. Left ovarian cyst 3 simple stable as symptomatic left ovarian cyst.  Patient reassured, will observe.  Follow-up annual gynecologic exam.  Nicole Maddox, it was a pleasure seeing you today!

## 2019-01-16 ENCOUNTER — Other Ambulatory Visit: Payer: Self-pay

## 2019-01-17 ENCOUNTER — Encounter: Payer: Self-pay | Admitting: Obstetrics & Gynecology

## 2019-01-17 ENCOUNTER — Ambulatory Visit (INDEPENDENT_AMBULATORY_CARE_PROVIDER_SITE_OTHER): Payer: BC Managed Care – PPO | Admitting: Obstetrics & Gynecology

## 2019-01-17 VITALS — BP 124/78 | Ht 64.0 in | Wt 152.0 lb

## 2019-01-17 DIAGNOSIS — Z01419 Encounter for gynecological examination (general) (routine) without abnormal findings: Secondary | ICD-10-CM | POA: Diagnosis not present

## 2019-01-17 DIAGNOSIS — N930 Postcoital and contact bleeding: Secondary | ICD-10-CM

## 2019-01-17 DIAGNOSIS — Z789 Other specified health status: Secondary | ICD-10-CM | POA: Diagnosis not present

## 2019-01-17 DIAGNOSIS — Z1151 Encounter for screening for human papillomavirus (HPV): Secondary | ICD-10-CM | POA: Diagnosis not present

## 2019-01-17 DIAGNOSIS — D251 Intramural leiomyoma of uterus: Secondary | ICD-10-CM

## 2019-01-17 DIAGNOSIS — Z113 Encounter for screening for infections with a predominantly sexual mode of transmission: Secondary | ICD-10-CM

## 2019-01-17 DIAGNOSIS — N83202 Unspecified ovarian cyst, left side: Secondary | ICD-10-CM | POA: Diagnosis not present

## 2019-01-17 NOTE — Progress Notes (Signed)
Nicole Maddox 09/09/1980 [id number]   History:    38 y.o. G0 Single  RP:  Established patient presenting for annual gyn exam   HPI: Menses every month with normal flow.  No breakthrough bleeding.  No pelvic pain.  Had a pelvic ultrasound March 2020 showing a stable intramural fibroid at 3+ centimeters and 3 simple stable left ovarian cysts.  Sexually active, using condoms.  Had bleeding during intercourse and right after last time she was sexually active 2 days prior to her menstrual period.  No pain with intercourse.  Would like STI screening.  Urine and bowel movements normal.  Breasts normal.  Body mass index 26.09.  Needs to increase physical activities.  Healthy nutrition.  Health labs with family physician.  Past medical history,surgical history, family history and social history were all reviewed and documented in the EPIC chart.  Gynecologic History Patient's last menstrual period was 01/06/2019. Contraception: condoms Last Pap: 03/2018.  Results were: Negative/HPV HR neg.  Gono-Chlam neg. Last mammogram: Never Bone Density: Never Colonoscopy: Never  Obstetric History OB History  Gravida Para Term Preterm AB Living  0 0 0 0 0 0  SAB TAB Ectopic Multiple Live Births  0 0 0 0 0     ROS: A ROS was performed and pertinent positives and negatives are included in the history.  GENERAL: No fevers or chills. HEENT: No change in vision, no earache, sore throat or sinus congestion. NECK: No pain or stiffness. CARDIOVASCULAR: No chest pain or pressure. No palpitations. PULMONARY: No shortness of breath, cough or wheeze. GASTROINTESTINAL: No abdominal pain, nausea, vomiting or diarrhea, melena or bright red blood per rectum. GENITOURINARY: No urinary frequency, urgency, hesitancy or dysuria. MUSCULOSKELETAL: No joint or muscle pain, no back pain, no recent trauma. DERMATOLOGIC: No rash, no itching, no lesions. ENDOCRINE: No polyuria, polydipsia, no heat or cold intolerance. No recent  change in weight. HEMATOLOGICAL: No anemia or easy bruising or bleeding. NEUROLOGIC: No headache, seizures, numbness, tingling or weakness. PSYCHIATRIC: No depression, no loss of interest in normal activity or change in sleep pattern.     Exam:   BP 124/78   Ht 5\' 4"  (1.626 m)   Wt 152 lb (68.9 kg)   LMP 01/06/2019   BMI 26.09 kg/m   Body mass index is 26.09 kg/m.  General appearance : Well developed well nourished female. No acute distress HEENT: Eyes: no retinal hemorrhage or exudates,  Neck supple, trachea midline, no carotid bruits, no thyroidmegaly Lungs: Clear to auscultation, no rhonchi or wheezes, or rib retractions  Heart: Regular rate and rhythm, no murmurs or gallops Breast:Examined in sitting and supine position were symmetrical in appearance, no palpable masses or tenderness,  no skin retraction, no nipple inversion, no nipple discharge, no skin discoloration, no axillary or supraclavicular lymphadenopathy Abdomen: no palpable masses or tenderness, no rebound or guarding Extremities: no edema or skin discoloration or tenderness  Pelvic: Vulva: Normal             Vagina: No gross lesions or discharge  Cervix: No gross lesions or discharge.  Pap/HPV HR, Gono-Chlam done.  Uterus  AV, normal size, shape and consistency, non-tender and mobile  Adnexa  Rt: Without masses or tenderness.  Lt: 4 cm cyst, NT.  Anus: Normal   Assessment/Plan:  38 y.o. female for annual exam   1. Encounter for routine gynecological examination with Papanicolaou smear of cervix Normal gynecologic exam except for possible left ovarian cyst.  Had 3 simple left  ovarian cyst on last pelvic ultrasound March 2020.  Pap test with high-risk HPV done today.  Breast exam normal.  Health labs with family physician.  Body mass index 26.09.  Recommend improving fitness.  2. Screen for STD (sexually transmitted disease) Recommend continued use of condoms. - Gono-Chlam on Pap - HIV antibody (with reflex) -  Hepatitis C Antibody - Hepatitis B Surface AntiGEN - RPR  3. Use of condoms for contraception  4. PCB (post coital bleeding) Bleeding during intercourse and immediately after x1.  We will follow-up with a pelvic ultrasound to rule out endometrial pathology. - US Transvaginal Non-OB; Future  5. Left ovarian cyst Probable persistent left ovarian cyst per exam today.  We will follow-up with a pelvic ultrasound to evaluate. - US Transvaginal Non-OB; Future  6. Intramural uterine fibroid Intramural uterine fibroid which was stable on last ultrasound March 2020 at 3+ centimeters.  Will reevaluate with a pelvic ultrasound at follow-up. - US Transvaginal Non-OB; Future  Counseling on above issues and coordination of care more than 50% for 10 minutes.  [name] MD, 11:43 AM 01/17/2019

## 2019-01-17 NOTE — Patient Instructions (Signed)
1. Encounter for routine gynecological examination with Papanicolaou smear of cervix Normal gynecologic exam except for possible left ovarian cyst.  Had 3 simple left ovarian cyst on last pelvic ultrasound March 2020.  Pap test with high-risk HPV done today.  Breast exam normal.  Health labs with family physician.  Body mass index 26.09.  Recommend improving fitness.  2. Screen for STD (sexually transmitted disease) Recommend continued use of condoms. - Gono-Chlam on Pap - HIV antibody (with reflex) - Hepatitis C Antibody - Hepatitis B Surface AntiGEN - RPR  3. Use of condoms for contraception  4. PCB (post coital bleeding) Bleeding during intercourse and immediately after x1.  We will follow-up with a pelvic ultrasound to rule out endometrial pathology. - US Transvaginal Non-OB; Future  5. Left ovarian cyst Probable persistent left ovarian cyst per exam today.  We will follow-up with a pelvic ultrasound to evaluate. - US Transvaginal Non-OB; Future  6. Intramural uterine fibroid Intramural uterine fibroid which was stable on last ultrasound March 2020 at 3+ centimeters.  Will reevaluate with a pelvic ultrasound at follow-up. - US Transvaginal Non-OB; Future  Augustina, it was a pleasure seeing you today!  I will inform you of your results as soon as they are available.

## 2019-01-17 NOTE — Addendum Note (Signed)
Addended by: Thurnell Garbe A on: 01/17/2019 12:54 PM   Modules accepted: Orders

## 2019-01-20 LAB — RPR: RPR Ser Ql: NONREACTIVE

## 2019-01-20 LAB — HEPATITIS C ANTIBODY
Hepatitis C Ab: NONREACTIVE
SIGNAL TO CUT-OFF: 0.01 (ref <1.00)

## 2019-01-20 LAB — HEPATITIS B SURFACE ANTIGEN: Hepatitis B Surface Ag: NONREACTIVE

## 2019-01-20 LAB — HIV ANTIBODY (ROUTINE TESTING W REFLEX): HIV 1&2 Ab, 4th Generation: NONREACTIVE

## 2019-01-21 LAB — PAP IG, CT-NG NAA, HPV HIGH-RISK
C. trachomatis RNA, TMA: NOT DETECTED
HPV DNA High Risk: NOT DETECTED
N. gonorrhoeae RNA, TMA: NOT DETECTED

## 2019-02-20 ENCOUNTER — Other Ambulatory Visit: Payer: BC Managed Care – PPO

## 2019-02-20 ENCOUNTER — Ambulatory Visit: Payer: BC Managed Care – PPO | Admitting: Obstetrics & Gynecology

## 2019-04-02 ENCOUNTER — Other Ambulatory Visit: Payer: Self-pay

## 2019-04-03 ENCOUNTER — Ambulatory Visit (INDEPENDENT_AMBULATORY_CARE_PROVIDER_SITE_OTHER): Payer: BC Managed Care – PPO

## 2019-04-03 ENCOUNTER — Encounter: Payer: Self-pay | Admitting: Obstetrics & Gynecology

## 2019-04-03 ENCOUNTER — Ambulatory Visit (INDEPENDENT_AMBULATORY_CARE_PROVIDER_SITE_OTHER): Payer: BC Managed Care – PPO | Admitting: Obstetrics & Gynecology

## 2019-04-03 DIAGNOSIS — N83202 Unspecified ovarian cyst, left side: Secondary | ICD-10-CM

## 2019-04-03 DIAGNOSIS — N930 Postcoital and contact bleeding: Secondary | ICD-10-CM | POA: Diagnosis not present

## 2019-04-03 DIAGNOSIS — D251 Intramural leiomyoma of uterus: Secondary | ICD-10-CM

## 2019-04-03 NOTE — Progress Notes (Signed)
    Nicole Maddox 03-24-1981 [id number]        38 y.o.  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  No pelvic pain.  No pain with intercourse.  No recurrence of postcoital bleeding.   OB History  Gravida Para Term Preterm AB Living  0 0 0 0 0 0  SAB TAB Ectopic Multiple Live Births  0 0 0 0 0    Past medical history,surgical history, problem list, medications, allergies, family history and social history were all reviewed and documented in the EPIC chart.   Directed ROS with pertinent positives and negatives documented in the history of present illness/assessment and plan.  Exam:  There were no vitals filed for this visit. General appearance:  Normal  Pelvic US today: T/V images.  Comparison is made with the previous scans on November 2019 and March 2020.  Anteverted uterus normal in size and shape with no change in a 3.4 cm calcified fibroid along the right uterine sidewall.  The uterus is measured at 9.59 x 6.75 x 4.59 cm.  The endometrial lining is symmetrical and measured at 12.34 mm with no mass or thickening seen.  The right ovary is normal.  The left ovary shows 3 simple cysts basically unchanged in size and appearance since previous scans.  The cysts measure 4.7 x 3.6 x 3.4 cm, 4.4 x 3.3 x 2.7 cm, 1.8 x 1.7 x 1.6 cm.  Perfusion to the ovarian tissue is present.  No free fluid is present in the posterior cul-de-sac.   Assessment/Plan:  38 y.o. G0P0000   1. Left ovarian cyst 3 simple benign appearing left ovarian cysts unchanged in size since the pelvic ultrasound in November 2019 and causing no symptoms.  The risk of ovarian cyst rupture, and more importantly the risk of ovarian torsion was thoroughly explained to patient.  Precautions were discussed and patient will call to be evaluated promptly if develops pelvic pain.  Patient prefers observation rather than  laparoscopic Lt ovarian cystectomies at this time.  2. Intramural uterine fibroid Stable calcified intramural fibroid at 3.4 cm.  Patient reassured.  Counseling on above issues and coordination of care more than 50% for 15 minutes.  [name] MD, 3:00 PM 04/03/2019

## 2019-04-03 NOTE — Patient Instructions (Signed)
1. Left ovarian cyst 3 simple benign appearing left ovarian cysts unchanged in size since the pelvic ultrasound in November 2019 and causing no symptoms.  The risk of ovarian cyst rupture, and more importantly the risk of ovarian torsion was thoroughly explained to patient.  Precautions were discussed and patient will call to be evaluated promptly if develops pelvic pain.  Patient prefers observation rather than laparoscopic Lt ovarian cystectomies at this time.  2. Intramural uterine fibroid Stable calcified intramural fibroid at 3.4 cm.  Patient reassured.  Nicole Maddox, it was a pleasure seeing you today!

## 2019-06-30 ENCOUNTER — Other Ambulatory Visit: Payer: Self-pay | Admitting: Obstetrics & Gynecology

## 2020-01-19 ENCOUNTER — Encounter: Payer: BC Managed Care – PPO | Admitting: Obstetrics & Gynecology

## 2020-01-27 ENCOUNTER — Encounter: Payer: BC Managed Care – PPO | Admitting: Obstetrics & Gynecology

## 2020-02-02 ENCOUNTER — Encounter: Payer: BC Managed Care – PPO | Admitting: Obstetrics & Gynecology

## 2020-02-02 DIAGNOSIS — Z0289 Encounter for other administrative examinations: Secondary | ICD-10-CM

## 2020-04-07 ENCOUNTER — Other Ambulatory Visit: Payer: Self-pay

## 2020-04-07 ENCOUNTER — Encounter: Payer: Self-pay | Admitting: Obstetrics & Gynecology

## 2020-04-07 ENCOUNTER — Ambulatory Visit (INDEPENDENT_AMBULATORY_CARE_PROVIDER_SITE_OTHER): Payer: BC Managed Care – PPO | Admitting: Obstetrics & Gynecology

## 2020-04-07 VITALS — BP 110/70 | Ht 64.0 in | Wt 192.0 lb

## 2020-04-07 DIAGNOSIS — Z1151 Encounter for screening for human papillomavirus (HPV): Secondary | ICD-10-CM

## 2020-04-07 DIAGNOSIS — Z789 Other specified health status: Secondary | ICD-10-CM | POA: Diagnosis not present

## 2020-04-07 DIAGNOSIS — Z113 Encounter for screening for infections with a predominantly sexual mode of transmission: Secondary | ICD-10-CM

## 2020-04-07 DIAGNOSIS — Z01419 Encounter for gynecological examination (general) (routine) without abnormal findings: Secondary | ICD-10-CM | POA: Diagnosis not present

## 2020-04-07 DIAGNOSIS — E6609 Other obesity due to excess calories: Secondary | ICD-10-CM

## 2020-04-07 DIAGNOSIS — Z6832 Body mass index (BMI) 32.0-32.9, adult: Secondary | ICD-10-CM

## 2020-04-07 NOTE — Progress Notes (Signed)
Nicole Maddox 1980/11/10 [id number]   History:    39 y.o. G0 Single  RP:  Established patient presenting for annual gyn exam   HPI: Menses every month with normal flow.  No breakthrough bleeding.  No pelvic pain.  Had a pelvic ultrasound March 2020 showing a stable intramural fibroid at 3+ centimeters and 3 simple stable left ovarian cysts.  Sexually active, using condoms.  Had bleeding during intercourse and right after last time she was sexually active 2 days prior to her menstrual period.  No pain with intercourse.  Would like STI screening.  Urine and bowel movements normal.  Breasts normal.  Body mass index 32.96.  Needs to increase physical activities.  Healthy nutrition.  Health labs with family physician.   Past medical history,surgical history, family history and social history were all reviewed and documented in the EPIC chart.  Gynecologic History Patient's last menstrual period was 03/19/2020.  Obstetric History OB History  Gravida Para Term Preterm AB Living  0 0 0 0 0 0  SAB TAB Ectopic Multiple Live Births  0 0 0 0 0     ROS: A ROS was performed and pertinent positives and negatives are included in the history.  GENERAL: No fevers or chills. HEENT: No change in vision, no earache, sore throat or sinus congestion. NECK: No pain or stiffness. CARDIOVASCULAR: No chest pain or pressure. No palpitations. PULMONARY: No shortness of breath, cough or wheeze. GASTROINTESTINAL: No abdominal pain, nausea, vomiting or diarrhea, melena or bright red blood per rectum. GENITOURINARY: No urinary frequency, urgency, hesitancy or dysuria. MUSCULOSKELETAL: No joint or muscle pain, no back pain, no recent trauma. DERMATOLOGIC: No rash, no itching, no lesions. ENDOCRINE: No polyuria, polydipsia, no heat or cold intolerance. No recent change in weight. HEMATOLOGICAL: No anemia or easy bruising or bleeding. NEUROLOGIC: No headache, seizures, numbness, tingling or weakness. PSYCHIATRIC: No  depression, no loss of interest in normal activity or change in sleep pattern.     Exam:   BP 110/70   Ht 5\' 4"  (1.626 m)   Wt 192 lb (87.1 kg)   LMP 03/19/2020 Comment: no birth control   BMI 32.96 kg/m   Body mass index is 32.96 kg/m.  General appearance : Well developed well nourished female. No acute distress HEENT: Eyes: no retinal hemorrhage or exudates,  Neck supple, trachea midline, no carotid bruits, no thyroidmegaly Lungs: Clear to auscultation, no rhonchi or wheezes, or rib retractions  Heart: Regular rate and rhythm, no murmurs or gallops Breast:Examined in sitting and supine position were symmetrical in appearance, no palpable masses or tenderness,  no skin retraction, no nipple inversion, no nipple discharge, no skin discoloration, no axillary or supraclavicular lymphadenopathy Abdomen: no palpable masses or tenderness, no rebound or guarding Extremities: no edema or skin discoloration or tenderness  Pelvic: Vulva: Normal             Vagina: No gross lesions or discharge  Cervix: No gross lesions or discharge.  Pap/HPV HR done.  Uterus  AV, normal size, shape and consistency, non-tender and mobile  Adnexa  Without masses or tenderness  Anus: Normal   Assessment/Plan:  39 y.o. female for annual exam   1. Encounter for routine gynecological examination with Papanicolaou smear of cervix Normal gynecologic exam.  Pap test with high-risk HPV done today.  Breast exam normal.  Health labs with family physician.  2. Use of condoms for contraception  3. Screen for STD (sexually transmitted disease) Recommend continued strict  condom use. - Gono-Chlam on Pap - HIV antibody (with reflex) - RPR - Hepatitis B Surface AntiGEN - Hepatitis C Antibody  4. Class 1 obesity due to excess calories without serious comorbidity with body mass index (BMI) of 32.0 to 32.9 in adult Recommend a lower calorie/carb diet.  Aerobic activities 5 times a week and light weightlifting every  2 days.  [name] MD, 2:13 PM 04/07/2020

## 2020-04-08 ENCOUNTER — Encounter: Payer: Self-pay | Admitting: Obstetrics & Gynecology

## 2020-04-08 LAB — HEPATITIS C ANTIBODY
Hepatitis C Ab: NONREACTIVE
SIGNAL TO CUT-OFF: 0.01 (ref <1.00)

## 2020-04-08 LAB — HEPATITIS B SURFACE ANTIGEN: Hepatitis B Surface Ag: NONREACTIVE

## 2020-04-08 LAB — HIV ANTIBODY (ROUTINE TESTING W REFLEX): HIV 1&2 Ab, 4th Generation: NONREACTIVE

## 2020-04-08 LAB — RPR: RPR Ser Ql: NONREACTIVE

## 2020-04-09 LAB — PAP IG, CT-NG NAA, HPV HIGH-RISK
C. trachomatis RNA, TMA: NOT DETECTED
HPV DNA High Risk: NOT DETECTED
N. gonorrhoeae RNA, TMA: NOT DETECTED

## 2021-12-23 ENCOUNTER — Ambulatory Visit (INDEPENDENT_AMBULATORY_CARE_PROVIDER_SITE_OTHER): Payer: Managed Care, Other (non HMO) | Admitting: Obstetrics & Gynecology

## 2021-12-23 ENCOUNTER — Encounter: Payer: Self-pay | Admitting: Obstetrics & Gynecology

## 2021-12-23 VITALS — BP 110/80 | HR 70 | Resp 16

## 2021-12-23 DIAGNOSIS — N6320 Unspecified lump in the left breast, unspecified quadrant: Secondary | ICD-10-CM

## 2021-12-23 DIAGNOSIS — N6310 Unspecified lump in the right breast, unspecified quadrant: Secondary | ICD-10-CM | POA: Diagnosis not present

## 2021-12-23 NOTE — Progress Notes (Signed)
    Nicole Maddox 1981/05/05 [id number]        41 y.o.  G0   RP: Large Rt breast lump felt x 2 days  HPI: Patient was experiencing GERD which made her touch her chest.  While doing so, she felt a large Rt breast lump.  Mildly tender.  No change in size in 2 days.  Breast skin normal.  No nipple discharge.  No fam h/o breast Ca.   OB History  Gravida Para Term Preterm AB Living  0 0 0 0 0 0  SAB IAB Ectopic Multiple Live Births  0 0 0 0 0    Past medical history,surgical history, problem list, medications, allergies, family history and social history were all reviewed and documented in the EPIC chart.   Directed ROS with pertinent positives and negatives documented in the history of present illness/assessment and plan.  Exam:  Vitals:   12/23/21 1520  BP: 110/80  Pulse: 70  Resp: 16   General appearance:  Normal  Breast exam:  Right breast:  Large Rt breast Mass at upper internal quadrant from 12 to 3 O'Clock measuring about 5 x 6 cm with smooth contours, mobile, non-tender.  No skin change. Axilla Neg.                         Lt breast:  2 x 2 cm mobile nodule with smooth contour at 10 O'Clock.  No skin change.  Axilla Neg.    Assessment/Plan:  41 y.o. G0  1. Masses of both breasts Patient was experiencing GERD which made her touch her chest.  While doing so, she felt a large Rt breast lump.  Mildly tender.  No change in size in 2 days.  Breast skin normal.  No nipple discharge.  No fam h/o breast Ca.  On breast exam:  Large Rt breast Mass at upper internal quadrant from 12 to 3 O'Clock measuring about 5 x 6 cm with smooth contours, mobile, non-tender.  No skin change. Axilla Neg.  Lt breast:  2 x 2 cm mobile nodule with smooth contour at 10 O'Clock.  No skin change.  Axilla Neg.  Both c/w Fibroadenomas.  Findings and plan reviewed with patient who voiced agreement.  R/O other pathologies.  Schedule Bilateral Dx Mammo and Bilateral Breast US.   Other orders -  Cholecalciferol (VITAMIN D3) 1.25 MG (50000 UT) CAPS; Take 1 capsule by mouth once a week. - folic acid (FOLVITE) 1 MG tablet; Take 1 mg by mouth daily. - methimazole (TAPAZOLE) 5 MG tablet; Take 15 mg by mouth every morning. - omeprazole (PRILOSEC) 40 MG capsule; Take 40 mg by mouth daily. (Patient not taking: Reported on 12/23/2021) - tacrolimus (PROTOPIC) 0.1 % ointment; Apply topically. - UNABLE TO FIND; Med Name: hair vitamin   Counseling and management of bilateral breast masses, probable diagnosis of fibroadenomas discussed, documentation reviewed, rule out other pathologies with bilateral diagnostic mammogram and bilateral breast ultrasound, for 25 minutes.  [name] MD, 3:29 PM 12/23/2021

## 2021-12-26 ENCOUNTER — Telehealth: Payer: Self-pay

## 2021-12-26 DIAGNOSIS — N6310 Unspecified lump in the right breast, unspecified quadrant: Secondary | ICD-10-CM

## 2021-12-26 DIAGNOSIS — N6320 Unspecified lump in the left breast, unspecified quadrant: Secondary | ICD-10-CM

## 2021-12-26 NOTE — Telephone Encounter (Signed)
Refer for Bilateral Dx Mammo and Bilateral Breast US Received: 3 days ago Nicole Bruins, MD  [facility] Triage Large Rt breast Mass at upper internal quadrant from 12 to 3 O'Clock measuring about 5 x 6 cm with smooth contours, mobile, non-tender.  No skin change.  Lt breast with a 2 x 2 cm mobile nodule with smooth contour at 10 O'Clock.  No skin change.  Both c/w Fibroadenomas.  R/O other pathologies.  Schedule Bilateral Dx Mammo and Bilateral Breast US.

## 2021-12-26 NOTE — Telephone Encounter (Signed)
Order placed in [facility]. Patient is scheduled for 01/23/22 at 2:40pm at [facility].

## 2021-12-26 NOTE — Telephone Encounter (Signed)
Spoke with patient and informed her of date/time and facility info. Provided her with phone number if she wants to call for earlier appt/cancellation.

## 2022-01-23 ENCOUNTER — Ambulatory Visit
Admission: RE | Admit: 2022-01-23 | Discharge: 2022-01-23 | Disposition: A | Discharge status: Home / Self Care | Payer: Self-pay | Source: Ambulatory Visit | Attending: Obstetrics & Gynecology | Admitting: Obstetrics & Gynecology

## 2022-01-23 ENCOUNTER — Ambulatory Visit: Payer: Managed Care, Other (non HMO) | Admitting: Obstetrics & Gynecology

## 2022-01-23 ENCOUNTER — Ambulatory Visit
Admission: RE | Admit: 2022-01-23 | Discharge: 2022-01-23 | Disposition: A | Discharge status: Home / Self Care | Payer: Managed Care, Other (non HMO) | Source: Ambulatory Visit | Attending: Obstetrics & Gynecology | Admitting: Obstetrics & Gynecology

## 2022-01-23 ENCOUNTER — Other Ambulatory Visit: Payer: Self-pay | Admitting: Obstetrics & Gynecology

## 2022-01-23 DIAGNOSIS — N6320 Unspecified lump in the left breast, unspecified quadrant: Secondary | ICD-10-CM

## 2022-01-23 DIAGNOSIS — N6310 Unspecified lump in the right breast, unspecified quadrant: Secondary | ICD-10-CM

## 2022-01-24 ENCOUNTER — Ambulatory Visit
Admission: RE | Admit: 2022-01-24 | Discharge: 2022-01-24 | Disposition: A | Discharge status: Home / Self Care | Payer: Managed Care, Other (non HMO) | Source: Ambulatory Visit | Attending: Obstetrics & Gynecology | Admitting: Obstetrics & Gynecology

## 2022-01-24 ENCOUNTER — Other Ambulatory Visit: Payer: Self-pay | Admitting: Radiology

## 2022-01-24 ENCOUNTER — Ambulatory Visit
Admission: RE | Admit: 2022-01-24 | Discharge: 2022-01-24 | Disposition: A | Discharge status: Home / Self Care | Payer: Managed Care, Other (non HMO) | Source: Ambulatory Visit | Attending: Radiology | Admitting: Radiology

## 2022-01-24 DIAGNOSIS — N6310 Unspecified lump in the right breast, unspecified quadrant: Secondary | ICD-10-CM

## 2022-03-22 ENCOUNTER — Ambulatory Visit (INDEPENDENT_AMBULATORY_CARE_PROVIDER_SITE_OTHER): Payer: Managed Care, Other (non HMO) | Admitting: Obstetrics & Gynecology

## 2022-03-22 ENCOUNTER — Encounter: Payer: Self-pay | Admitting: Obstetrics & Gynecology

## 2022-03-22 ENCOUNTER — Other Ambulatory Visit (HOSPITAL_COMMUNITY)
Admission: RE | Admit: 2022-03-22 | Discharge: 2022-03-22 | Disposition: A | Discharge status: Home / Self Care | Payer: Managed Care, Other (non HMO) | Source: Ambulatory Visit | Attending: Obstetrics & Gynecology | Admitting: Obstetrics & Gynecology

## 2022-03-22 VITALS — BP 120/78 | HR 84 | Ht 64.25 in | Wt 177.0 lb

## 2022-03-22 DIAGNOSIS — Z789 Other specified health status: Secondary | ICD-10-CM

## 2022-03-22 DIAGNOSIS — N6011 Diffuse cystic mastopathy of right breast: Secondary | ICD-10-CM | POA: Diagnosis not present

## 2022-03-22 DIAGNOSIS — Z01419 Encounter for gynecological examination (general) (routine) without abnormal findings: Secondary | ICD-10-CM | POA: Diagnosis present

## 2022-03-22 DIAGNOSIS — Z113 Encounter for screening for infections with a predominantly sexual mode of transmission: Secondary | ICD-10-CM | POA: Diagnosis present

## 2022-03-22 NOTE — Progress Notes (Signed)
Nicole Maddox 11-27-1980 [id number]   History:    41 y.o. G0 Single   RP:  Established patient presenting for annual gyn exam    HPI: Menses every month with normal flow.  No breakthrough bleeding.  No pelvic pain.  Had a pelvic ultrasound March 2020 showing a stable intramural fibroid at 3+ centimeters. Sexually active, using condoms.  No pain with intercourse.  Pap/HPV HR, Gono-Chlam today.  Would like STI screening. Urine and bowel movements normal.  Breasts normal.  Bilateral Dx mammo/US 01/2022.  Rt breast Bx 01/2022 Benign Fibrocystic disease.  Body mass index improved to 30.15.  Increased physical activities. Healthy nutrition.  Health labs with family physician.  Past medical history,surgical history, family history and social history were all reviewed and documented in the EPIC chart.  Gynecologic History Patient's last menstrual period was 03/14/2022 (exact date).  Obstetric History OB History  Gravida Para Term Preterm AB Living  0 0 0 0 0 0  SAB IAB Ectopic Multiple Live Births  0 0 0 0 0     ROS: A ROS was performed and pertinent positives and negatives are included in the history.  GENERAL: No fevers or chills. HEENT: No change in vision, no earache, sore throat or sinus congestion. NECK: No pain or stiffness. CARDIOVASCULAR: No chest pain or pressure. No palpitations. PULMONARY: No shortness of breath, cough or wheeze. GASTROINTESTINAL: No abdominal pain, nausea, vomiting or diarrhea, melena or bright red blood per rectum. GENITOURINARY: No urinary frequency, urgency, hesitancy or dysuria. MUSCULOSKELETAL: No joint or muscle pain, no back pain, no recent trauma. DERMATOLOGIC: No rash, no itching, no lesions. ENDOCRINE: No polyuria, polydipsia, no heat or cold intolerance. No recent change in weight. HEMATOLOGICAL: No anemia or easy bruising or bleeding. NEUROLOGIC: No headache, seizures, numbness, tingling or weakness. PSYCHIATRIC: No depression, no loss of interest in  normal activity or change in sleep pattern.     Exam:   BP 120/78   Pulse 84   Ht 5' 4.25" (1.632 m)   Wt 177 lb (80.3 kg)   LMP 03/14/2022 (Exact Date)   SpO2 98%   BMI 30.15 kg/m   Body mass index is 30.15 kg/m.  General appearance : Well developed well nourished female. No acute distress HEENT: Eyes: no retinal hemorrhage or exudates,  Neck supple, trachea midline, no carotid bruits, no thyroidmegaly Lungs: Clear to auscultation, no rhonchi or wheezes, or rib retractions  Heart: Regular rate and rhythm, no murmurs or gallops Breast:Examined in sitting and supine position were symmetrical in appearance, no palpable masses or tenderness,  no skin retraction, no nipple inversion, no nipple discharge, no skin discoloration, no axillary or supraclavicular lymphadenopathy Abdomen: no palpable masses or tenderness, no rebound or guarding Extremities: no edema or skin discoloration or tenderness  Pelvic: Vulva: Normal             Vagina: No gross lesions or discharge  Cervix: No gross lesions or discharge.  Pap/HPV HR, Gono-Chlam.  Uterus  AV, normal size, nodular with Fibroid felt along the Rt mid uterus, non-tender and mobile  Adnexa  Without masses or tenderness  Anus: Normal   Assessment/Plan:  41 y.o. female for annual exam   1. Encounter for routine gynecological examination with Papanicolaou smear of cervix Menses every month with normal flow.  No breakthrough bleeding.  No pelvic pain.  Had a pelvic ultrasound March 2020 showing a stable intramural fibroid at 3+ centimeters. Sexually active, using condoms.  No pain with  intercourse.  Pap/HPV HR, Gono-Chlam today.  Would like STI screening. Urine and bowel movements normal.  Breasts normal.  Bilateral Dx mammo/US 01/2022.  Rt breast Bx 01/2022 Benign Fibrocystic disease.  Body mass index improved to 30.15.  Increased physical activities. Healthy nutrition.  Health labs with family physician. - Cytology - PAP( [place])  2.  Use of condoms for contraception  3. Screen for STD (sexually transmitted disease) - HIV antibody (with reflex) - RPR - Hepatitis B Surface AntiGEN - Hepatitis C Antibody - Cytology - PAP( [place]) - Gono-Chlam  4. Fibrocystic disease of right breast Rt Breast Bx 01/2022.  Other orders - FERROCITE 324 MG TABS tablet; Take 1 tablet by mouth daily. - ketoconazole (NIZORAL) 2 % shampoo; Apply topically.   Nicole Bruins MD, 1:46 PM 03/22/2022

## 2022-03-23 LAB — RPR: RPR Ser Ql: NONREACTIVE

## 2022-03-23 LAB — HEPATITIS B SURFACE ANTIGEN: Hepatitis B Surface Ag: NONREACTIVE

## 2022-03-23 LAB — HEPATITIS C ANTIBODY: Hepatitis C Ab: NONREACTIVE

## 2022-03-23 LAB — HIV ANTIBODY (ROUTINE TESTING W REFLEX): HIV 1&2 Ab, 4th Generation: NONREACTIVE

## 2022-03-24 LAB — CYTOLOGY - PAP
Chlamydia: NEGATIVE
Comment: NEGATIVE
Comment: NEGATIVE
Comment: NORMAL
Diagnosis: NEGATIVE
High risk HPV: NEGATIVE
Neisseria Gonorrhea: NEGATIVE

## 2022-03-27 ENCOUNTER — Encounter: Payer: Self-pay | Admitting: Nurse Practitioner

## 2022-03-27 ENCOUNTER — Ambulatory Visit (INDEPENDENT_AMBULATORY_CARE_PROVIDER_SITE_OTHER): Payer: Managed Care, Other (non HMO) | Admitting: Nurse Practitioner

## 2022-03-27 VITALS — BP 102/64 | HR 77

## 2022-03-27 DIAGNOSIS — N9089 Other specified noninflammatory disorders of vulva and perineum: Secondary | ICD-10-CM

## 2022-03-27 DIAGNOSIS — B9689 Other specified bacterial agents as the cause of diseases classified elsewhere: Secondary | ICD-10-CM | POA: Diagnosis not present

## 2022-03-27 DIAGNOSIS — N760 Acute vaginitis: Secondary | ICD-10-CM | POA: Diagnosis not present

## 2022-03-27 LAB — WET PREP FOR TRICH, YEAST, CLUE

## 2022-03-27 MED ORDER — METRONIDAZOLE 500 MG PO TABS: 500.0000 mg | ORAL_TABLET | Freq: Two times a day (BID) | ORAL | 0 refills | Status: DC

## 2022-03-27 NOTE — Progress Notes (Signed)
   Acute Office Visit  Subjective:    Patient ID: Nicole Maddox, female    DOB: 1980/08/10, 41 y.o.   MRN: [id number]   HPI 41 y.o. presents today for vulvar irritation that started a couple of days ago. Used Monistat last night and has some relief of symptoms but still reports burning today. Denies discharge, odor or urinary symptoms. Negative STD screening 03/22/22.    Review of Systems  Constitutional: Negative.   Genitourinary:  Positive for vaginal pain (Vulvar irritation). Negative for difficulty urinating, dysuria, flank pain, frequency, genital sores, urgency and vaginal discharge.       Objective:    Physical Exam Constitutional:      Appearance: Normal appearance.  Genitourinary:    General: Normal vulva.     Vagina: Vaginal discharge (Unable to decern discharge vs medicinal cream) and erythema present.     BP 102/64   Pulse 77   LMP 03/14/2022 (Exact Date)   SpO2 99%  Wt Readings from Last 3 Encounters:  03/22/22 177 lb (80.3 kg)  04/07/20 192 lb (87.1 kg)  01/17/19 152 lb (68.9 kg)        Patient informed chaperone available to be present for breast and/or pelvic exam. Patient has requested no chaperone to be present. Patient has been advised what will be completed during breast and pelvic exam.   Wet prep + clue cells (+ odor)  Assessment & Plan:   Problem List Items Addressed This Visit   None Visit Diagnoses     Bacterial vaginosis    -  Primary   Relevant Medications   metroNIDAZOLE (FLAGYL) 500 MG tablet   Vulvar irritation       Relevant Orders   WET PREP FOR TRICH, YEAST, CLUE      Plan: Wet prep positive for clue cells - Flagyl 500 mg BID x 7 days.      Tamela Gammon DNP, 2:25 PM 03/27/2022

## 2022-06-06 ENCOUNTER — Telehealth: Payer: Self-pay

## 2022-06-06 ENCOUNTER — Ambulatory Visit (INDEPENDENT_AMBULATORY_CARE_PROVIDER_SITE_OTHER): Payer: Managed Care, Other (non HMO) | Admitting: Obstetrics & Gynecology

## 2022-06-06 ENCOUNTER — Encounter: Payer: Self-pay | Admitting: Obstetrics & Gynecology

## 2022-06-06 VITALS — BP 104/70 | HR 72 | Temp 98.3°F | Resp 16

## 2022-06-06 DIAGNOSIS — N6321 Unspecified lump in the left breast, upper outer quadrant: Secondary | ICD-10-CM

## 2022-06-06 DIAGNOSIS — N6320 Unspecified lump in the left breast, unspecified quadrant: Secondary | ICD-10-CM

## 2022-06-06 DIAGNOSIS — R350 Frequency of micturition: Secondary | ICD-10-CM | POA: Diagnosis not present

## 2022-06-06 MED ORDER — SULFAMETHOXAZOLE-TRIMETHOPRIM 800-160 MG PO TABS: 1.0000 | ORAL_TABLET | Freq: Two times a day (BID) | ORAL | 0 refills | Status: AC

## 2022-06-06 NOTE — Progress Notes (Signed)
    Nicole Maddox Sep 10, 1980 [id number]        42 y.o.  [id number]   RP: Lt breast lump and urinary frequency and cloudiness x a week  HPI: Lt breast lump noticed by patient x about a week.  C/O urinary frequency, voiding small amounts of a cloudy urine with odor.  Also noticed a small amount of blood when wiping.  No pelvic pain.  No vaginal itching.  BMs normal.  No fever.   OB History  Gravida Para Term Preterm AB Living  0 0 0 0 0 0  SAB IAB Ectopic Multiple Live Births  0 0 0 0 0    Past medical history,surgical history, problem list, medications, allergies, family history and social history were all reviewed and documented in the EPIC chart.   Directed ROS with pertinent positives and negatives documented in the history of present illness/assessment and plan.  Exam:  Vitals:   06/06/22 1003  BP: 104/70  Pulse: 72  Resp: 16  Temp: 98.3 F (36.8 C)  TempSrc: Oral   General appearance:  Normal  Breast exam:   Rt breast normal.  Rt axilla Neg.                         Lt breast: Mobile, mildly tender mass 3 x 3 cm at 1-2 O'Clock externally.  Let axilla Neg.  CVAT Neg bilaterally  Abdomen: Normal  Gynecologic exam: Vulva normal.  Bimanual exam  U/A: Yellow cloudy, Pro 3+, Nit Positive, WBC >60, RBC 20-30, Bacteria Many.  Pending U. Culture.   Assessment/Plan:  42 y.o. G0   1. Urinary frequency Probable acute cystitis per clinical presentation and U/A.  Pending U. Culture.  Start on Bactrim DS 1 tab PO BID x 3 days.  Usage reviewed, no allergy.  Prescription sent to pharmacy. - Urinalysis,Complete w/RFL Culture  2. Mass of upper outer quadrant of left breast Lt breast lump noticed by patient x about a week.  Lt breast exam with a mobile, mildly tender mass 3 x 3 cm at 1-2 O'Clock externally.  Let axilla Neg.  Schedule a Lt Dx mammo/US.  Patient agrees with the plan.  Other orders - sulfamethoxazole-trimethoprim (BACTRIM DS) 800-160 MG tablet; Take 1 tablet by  mouth 2 (two) times daily for 3 days. - Urine Culture - REFLEXIVE URINE CULTURE   [name] MD, 10:38 AM 06/06/2022

## 2022-06-06 NOTE — Telephone Encounter (Signed)
Nicole Bruins, MD  P [facility] Triage  Schedule Lt Dx mammo/US  Left breast mass at 1-2 O'Clock on outside 3 x 3 cm, mildly tender, mobile.

## 2022-06-07 ENCOUNTER — Telehealth: Payer: Self-pay

## 2022-06-07 NOTE — Telephone Encounter (Signed)
Patient is scheduled with [facility] for Nicole Maddox., Feb 7 at 2:00pm.

## 2022-06-07 NOTE — Telephone Encounter (Signed)
Spoke with patient and informed her of date/time.

## 2022-06-07 NOTE — Telephone Encounter (Signed)
Pt calling to ask about appt for breast, she is seeing in mychart that she has/needs an appt but nothing is actually scheduled. I advised pt that it is probably the orders for her imaging tests that we ordered and were just waiting on [facility] to contact her to schedule. Advised pt if she doesn't hear from them by Friday to call and schedule. Pt voiced understanding.

## 2022-06-08 LAB — URINALYSIS, COMPLETE W/RFL CULTURE
Bilirubin Urine: NEGATIVE
Glucose, UA: NEGATIVE
Hyaline Cast: NONE SEEN /LPF
Nitrites, Initial: POSITIVE — AB
Specific Gravity, Urine: 1.009 (ref 1.001–1.035)
WBC, UA: >=60 /HPF — AB (ref 0–5)
pH: 6 (ref 5.0–8.0)

## 2022-06-08 LAB — URINE CULTURE
MICRO NUMBER:: 14376684
SPECIMEN QUALITY:: ADEQUATE

## 2022-06-08 LAB — CULTURE INDICATED

## 2022-06-15 ENCOUNTER — Ambulatory Visit (INDEPENDENT_AMBULATORY_CARE_PROVIDER_SITE_OTHER): Payer: Managed Care, Other (non HMO) | Admitting: Radiology

## 2022-06-15 VITALS — BP 112/74

## 2022-06-15 DIAGNOSIS — D219 Benign neoplasm of connective and other soft tissue, unspecified: Secondary | ICD-10-CM

## 2022-06-15 DIAGNOSIS — R319 Hematuria, unspecified: Secondary | ICD-10-CM

## 2022-06-15 DIAGNOSIS — N921 Excessive and frequent menstruation with irregular cycle: Secondary | ICD-10-CM

## 2022-06-15 MED ORDER — MYFEMBREE 40-1-0.5 MG PO TABS: 1.0000 | ORAL_TABLET | Freq: Every day | ORAL | 3 refills | Status: DC

## 2022-06-15 NOTE — Progress Notes (Signed)
      Subjective: Nicole Maddox is a 42 y.o. female who complains of bleeding, unsure if it is coming from her urethra or vagina. Her LMP was 05/27/22, heavy. Soaks through size 5 pads in an hour. Hx of fibroid on last u/s 2020. Was treated for UTI 10 days ago, finished all meds, urinary symptoms are resolved.    Review of Systems  All other systems reviewed and are negative.   Past Medical History:  Diagnosis Date   Hyperthyroidism    Thyroid disease       Objective:  Today's Vitals   06/15/22 1346  BP: 112/74   There is no height or weight on file to calculate BMI.   -General: no acute distress -Vulva: without lesions or discharge -Vagina: moderate bright red blood present, no pooling -Cervix: no lesion or discharge, no CMT -Perineum: no lesions -Uterus: Mobile, non tender -Adnexa: no masses or tenderness  Urine dipstick shows positive for RBC's.  Micro exam: 3-10 RBC's per HPF and few+ bacteria.  Chaperone offered and declined.  Assessment:/Plan:   1. Hematuria, unspecified type Reassured menstrual, not urinary - Urinalysis,Complete w/RFL Culture - Pregnancy, urine; negative  2. Fibroid  3. Menorrhagia with irregular cycle  - Relugolix-Estradiol-Norethind (MYFEMBREE) 40-1-0.5 MG TABS; Take 1 tablet by mouth daily.  Dispense: 28 tablet; Refill: 3  Discussed meds in detail, has a history of non compliance with thyoid meds, stressed the importance of taking all medications as prescribed and leaving them in a safe place she will see them daily  Follow up 3 months

## 2022-06-17 LAB — CULTURE INDICATED

## 2022-06-17 LAB — URINE CULTURE
MICRO NUMBER:: 14418705
SPECIMEN QUALITY:: ADEQUATE

## 2022-06-17 LAB — URINALYSIS, COMPLETE W/RFL CULTURE
Bilirubin Urine: NEGATIVE
Glucose, UA: NEGATIVE
Hyaline Cast: NONE SEEN /LPF
Ketones, ur: NEGATIVE
Nitrites, Initial: NEGATIVE
Specific Gravity, Urine: 1.012 (ref 1.001–1.035)
pH: 8.5 — ABNORMAL HIGH (ref 5.0–8.0)

## 2022-06-17 LAB — PREGNANCY, URINE: Preg Test, Ur: NEGATIVE

## 2022-06-21 ENCOUNTER — Other Ambulatory Visit: Payer: Self-pay | Admitting: Obstetrics & Gynecology

## 2022-06-21 ENCOUNTER — Ambulatory Visit
Admission: RE | Admit: 2022-06-21 | Discharge: 2022-06-21 | Disposition: A | Discharge status: Home / Self Care | Payer: Managed Care, Other (non HMO) | Source: Ambulatory Visit | Attending: Obstetrics & Gynecology | Admitting: Obstetrics & Gynecology

## 2022-06-21 DIAGNOSIS — N6320 Unspecified lump in the left breast, unspecified quadrant: Secondary | ICD-10-CM

## 2022-06-21 DIAGNOSIS — R599 Enlarged lymph nodes, unspecified: Secondary | ICD-10-CM

## 2022-06-23 ENCOUNTER — Ambulatory Visit
Admission: RE | Admit: 2022-06-23 | Discharge: 2022-06-23 | Disposition: A | Discharge status: Home / Self Care | Payer: Managed Care, Other (non HMO) | Source: Ambulatory Visit | Attending: Obstetrics & Gynecology | Admitting: Obstetrics & Gynecology

## 2022-06-23 DIAGNOSIS — R599 Enlarged lymph nodes, unspecified: Secondary | ICD-10-CM

## 2022-06-23 DIAGNOSIS — C50919 Malignant neoplasm of unspecified site of unspecified female breast: Secondary | ICD-10-CM

## 2022-06-23 DIAGNOSIS — N6320 Unspecified lump in the left breast, unspecified quadrant: Secondary | ICD-10-CM

## 2022-06-23 HISTORY — DX: Malignant neoplasm of unspecified site of unspecified female breast: C50.919

## 2022-06-23 HISTORY — PX: BREAST BIOPSY: SHX20

## 2022-06-27 DIAGNOSIS — C50412 Malignant neoplasm of upper-outer quadrant of left female breast: Secondary | ICD-10-CM | POA: Insufficient documentation

## 2022-06-28 ENCOUNTER — Ambulatory Visit
Admission: RE | Admit: 2022-06-28 | Discharge: 2022-06-28 | Disposition: A | Discharge status: Home / Self Care | Payer: Managed Care, Other (non HMO) | Source: Ambulatory Visit | Attending: Radiation Oncology | Admitting: Radiation Oncology

## 2022-06-28 ENCOUNTER — Other Ambulatory Visit: Payer: Self-pay

## 2022-06-28 ENCOUNTER — Encounter: Payer: Self-pay | Admitting: Radiation Oncology

## 2022-06-28 VITALS — BP 108/72 | HR 74 | Temp 97.9°F | Resp 18 | Ht 64.0 in | Wt 168.4 lb

## 2022-06-28 DIAGNOSIS — Z171 Estrogen receptor negative status [ER-]: Secondary | ICD-10-CM | POA: Diagnosis not present

## 2022-06-28 DIAGNOSIS — E0590 Thyrotoxicosis, unspecified without thyrotoxic crisis or storm: Secondary | ICD-10-CM | POA: Diagnosis not present

## 2022-06-28 DIAGNOSIS — C50412 Malignant neoplasm of upper-outer quadrant of left female breast: Secondary | ICD-10-CM | POA: Diagnosis present

## 2022-06-28 DIAGNOSIS — Z79899 Other long term (current) drug therapy: Secondary | ICD-10-CM | POA: Diagnosis not present

## 2022-06-28 NOTE — Progress Notes (Signed)
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   HISTORY OF PRESENT ILLNESS: Nicole Maddox is a 42 y.o. female seen at the request of Dr. Marlou Starks for a new diagnosis of left  breast cancer. The patient was noted to have palpable mass in the left breast. Diagnostic work up showed a 2.7 cm mas sin the 1:00 position, and two abnormal appearing axillary nodes. 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 Her axillary tail biopsy was negative for disease. Her cancer was ER positive with weak staining, PR negative, HER2 negative with a Ki 67 of 95%. She's seen to discuss treatment recommendations of her cancer.     PREVIOUS RADIATION THERAPY: No   PAST MEDICAL HISTORY:  Past Medical History:  Diagnosis Date   Hyperthyroidism    Thyroid disease        PAST SURGICAL HISTORY: Past Surgical History:  Procedure Laterality Date   BREAST BIOPSY Left 06/23/2022   [place] LT BREAST BX W LOC DEV 1ST LESION IMG BX SPEC US GUIDE 06/23/2022 [facility] MAMMOGRAPHY   WISDOM TOOTH EXTRACTION       FAMILY HISTORY:  Family History  Problem Relation Age of Onset   Stomach cancer Maternal Aunt    Throat cancer Maternal Uncle    Stomach cancer Maternal Uncle    Heart Problems Maternal Grandmother      SOCIAL HISTORY:  reports that she has never smoked. She has never used smokeless tobacco. She reports current alcohol use. She reports that she does not use drugs. The patient is single and lives in [place]. She is a Licensed conveyancer. She previously lived in [place] which is why she was seeking her medical care  here. She reports she is not planning on future fertility, and enjoys travelling with friends.    ALLERGIES: Patient has no known allergies.   MEDICATIONS:  Current Outpatient Medications  Medication Sig Dispense Refill   FERROCITE 324 MG TABS tablet Take 1 tablet by mouth daily.     folic acid (FOLVITE) 1 MG tablet Take 1 mg by mouth daily.     ketoconazole (NIZORAL) 2 % shampoo Apply topically.     methimazole (TAPAZOLE) 5 MG tablet Take 15 mg by mouth every morning.     Multiple Vitamin (MULTIVITAMIN PO) Take by mouth.     omeprazole (PRILOSEC) 40 MG capsule Take 40 mg by mouth daily.     PREBIOTIC PRODUCT PO Take by mouth.     Relugolix-Estradiol-Norethind (MYFEMBREE) 40-1-0.5 MG TABS Take 1 tablet by mouth daily. 28 tablet 3   No current facility-administered medications for this visit.     REVIEW OF SYSTEMS: On review of systems, the patient reports that she is doing pretty well, but admits she feels like she has not realized the impact and reconciled her diagnosis. She was hoping to keep her diagnosis to herself, but is planning to talk with her family this week about her cancer. No other complaints specific to the breast are noted.      PHYSICAL EXAM:  Wt Readings from Last 3  Encounters:  03/22/22 177 lb (80.3 kg)  04/07/20 192 lb (87.1 kg)  01/17/19 152 lb (68.9 kg)   Temp Readings from Last 3 Encounters:  06/06/22 98.3 F (36.8 C) (Oral)   BP Readings from Last 3 Encounters:  06/15/22 112/74  06/06/22 104/70  03/27/22 102/64   Pulse Readings from Last 3 Encounters:  06/06/22 72  03/27/22 77  03/22/22 84    In general this is a well appearing African American female in no acute distress. She's alert and oriented x4 and appropriate throughout the examination. Cardiopulmonary assessment is negative for acute distress and she exhibits normal effort. Bilateral breast exam is deferred.    ECOG = 0  0 - Asymptomatic (Fully active, able to carry on all  predisease activities without restriction)  1 - Symptomatic but completely ambulatory (Restricted in physically strenuous activity but ambulatory and able to carry out work of a light or sedentary nature. For example, light housework, office work)  2 - Symptomatic, <50% in bed during the day (Ambulatory and capable of all self care but unable to carry out any work activities. Up and about more than 50% of waking hours)  3 - Symptomatic, >50% in bed, but not bedbound (Capable of only limited self-care, confined to bed or chair 50% or more of waking hours)  4 - Bedbound (Completely disabled. Cannot carry on any self-care. Totally confined to bed or chair)  5 - Death   Eustace Pen MM, Creech RH, Tormey DC, et al. [phone]). "Toxicity and response criteria of the [facility]". [place] Oncol. 5 (6): 649-55    LABORATORY DATA:  No results found for: "WBC", "HGB", "HCT", "MCV", "PLT" No results found for: "NA", "K", "CL", "CO2" No results found for: "ALT", "AST", "GGT", "ALKPHOS", "BILITOT"    RADIOGRAPHY: [place] LT BREAST BX W LOC DEV 1ST LESION IMG BX SPEC US GUIDE  Result Date: 06/23/2022 CLINICAL DATA:  42 year old female presenting for biopsy of a left breast mass and a left axillary lymph node. EXAM: ULTRASOUND GUIDED LEFT BREAST CORE NEEDLE BIOPSY [place] AXILLARY NODE CORE BIOPSY LEFT COMPARISON:  Previous exam(s). PROCEDURE: I met with the patient and we discussed the procedure of ultrasound-guided biopsy, including benefits and alternatives. We discussed the high likelihood of a successful procedure. We discussed the risks of the procedure, including infection, bleeding, tissue injury, clip migration, and inadequate sampling. Informed written consent was given. The usual time-out protocol was performed immediately prior to the procedure. Lesion quadrant: Upper outer quadrant Using sterile technique and 1% Lidocaine as local anesthetic, under direct ultrasound visualization, a 14  gauge spring-loaded device was used to perform biopsy of a mass in the left breast at 1 o'clock using a lateral approach. At the conclusion of the procedure coil shaped tissue marker clip was deployed into the biopsy cavity. Follow up 2 view mammogram was performed and dictated separately. Left axilla Using sterile technique and 1% Lidocaine as local anesthetic, under direct ultrasound visualization, a 14 gauge spring-loaded device was used to perform biopsy of a left axillary lymph node using a lateral approach. At the conclusion of the procedure a HydroMARK shaped tissue marker clip was deployed into the biopsy cavity. Follow up 2 view mammogram was performed and dictated separately. IMPRESSION: Ultrasound guided biopsy of a mass in the left breast at 1 o'clock and of a left axillary lymph node. No apparent complications. Electronically Signed   By: Audie Pinto M.D.   On: 06/23/2022 12:06  [place] AXILLARY  NODE CORE BIOPSY LEFT  Result Date: 06/23/2022 CLINICAL DATA:  42 year old female presenting for biopsy of a left breast mass and a left axillary lymph node. EXAM: ULTRASOUND GUIDED LEFT BREAST CORE NEEDLE BIOPSY [place] AXILLARY NODE CORE BIOPSY LEFT COMPARISON:  Previous exam(s). PROCEDURE: I met with the patient and we discussed the procedure of ultrasound-guided biopsy, including benefits and alternatives. We discussed the high likelihood of a successful procedure. We discussed the risks of the procedure, including infection, bleeding, tissue injury, clip migration, and inadequate sampling. Informed written consent was given. The usual time-out protocol was performed immediately prior to the procedure. Lesion quadrant: Upper outer quadrant Using sterile technique and 1% Lidocaine as local anesthetic, under direct ultrasound visualization, a 14 gauge spring-loaded device was used to perform biopsy of a mass in the left breast at 1 o'clock using a lateral approach. At the conclusion of the procedure coil  shaped tissue marker clip was deployed into the biopsy cavity. Follow up 2 view mammogram was performed and dictated separately. Left axilla Using sterile technique and 1% Lidocaine as local anesthetic, under direct ultrasound visualization, a 14 gauge spring-loaded device was used to perform biopsy of a left axillary lymph node using a lateral approach. At the conclusion of the procedure a HydroMARK shaped tissue marker clip was deployed into the biopsy cavity. Follow up 2 view mammogram was performed and dictated separately. IMPRESSION: Ultrasound guided biopsy of a mass in the left breast at 1 o'clock and of a left axillary lymph node. No apparent complications. Electronically Signed   By: Audie Pinto M.D.   On: 06/23/2022 12:06  MM CLIP PLACEMENT LEFT  Result Date: 06/23/2022 CLINICAL DATA:  Post procedure mammogram for clip placement EXAM: 3D DIAGNOSTIC LEFT MAMMOGRAM POST ULTRASOUND BIOPSY COMPARISON:  Previous exam(s). FINDINGS: 3D Mammographic images were obtained following ultrasound guided biopsy of a mass in the left breast at 1 o'clock. The biopsy marking clip is in expected position at the site of biopsy. 3D Mammographic images were obtained following ultrasound guided biopsy of a left axillary lymph node. The biopsy marking clip is in expected position at the site of biopsy. IMPRESSION: Appropriate positioning of the coil shaped biopsy marking clip at the site of biopsy in the left breast at 1 o'clock. Appropriate positioning of the [facility] shaped biopsy marking clip at the site of biopsy in the left axilla. Final Assessment: Post Procedure Mammograms for Marker Placement Electronically Signed   By: Audie Pinto M.D.   On: 06/23/2022 12:04  MM DIAG BREAST TOMO UNI LEFT  Addendum Date: 06/21/2022   ADDENDUM REPORT: 06/21/2022 12:50 ADDENDUM: COMPARISON should read: Previous exams. Electronically Signed   By: Margarette Canada M.D.   On: 06/21/2022 12:50   Result Date:  06/21/2022 CLINICAL DATA:  42 year old female with new palpable LEFT breast lump identified on self-examination. EXAM: DIGITAL DIAGNOSTIC UNILATERAL LEFT MAMMOGRAM WITH TOMOSYNTHESIS; ULTRASOUND LEFT BREAST LIMITED TECHNIQUE: Left digital diagnostic mammography and breast tomosynthesis was performed.; Targeted ultrasound examination of the left breast was performed. COMPARISON:  None available. ACR Breast Density Category c: The breast tissue is heterogeneously dense, which may obscure small masses. FINDINGS: Full field, spot compression and magnification views of the LEFT breast demonstrate a new partially circumscribed partially irregular mass containing pleomorphic calcifications within the UPPER-OUTER LEFT breast. Targeted ultrasound is performed, showing a 2.7 x 1.9 x 1.9 cm irregular hypoechoic mass at the 1 o'clock position of the LEFT breast 10 cm from the nipple corresponding to the patient's palpable abnormality.  Two axillary lymph nodes with cortical thickening noted. IMPRESSION: 1. Suspicious 2.7 cm UPPER-OUTER LEFT breast mass and 2 abnormal LEFT axillary lymph nodes with cortical thickening. Tissue sampling of the LEFT breast mass and 1 of the abnormal LEFT axillary lymph nodes recommended. RECOMMENDATION: Ultrasound-guided biopsies of the LEFT breast mass and a LEFT axillary lymph node, which will be scheduled. I have discussed the findings and recommendations with the patient. If applicable, a reminder letter will be sent to the patient regarding the next appointment. BI-RADS CATEGORY  4: Suspicious. Electronically Signed: By: Margarette Canada M.D. On: 06/21/2022 09:18   US BREAST LTD UNI LEFT INC AXILLA  Addendum Date: 06/21/2022   ADDENDUM REPORT: 06/21/2022 12:50 ADDENDUM: COMPARISON should read: Previous exams. Electronically Signed   By: Margarette Canada M.D.   On: 06/21/2022 12:50   Result Date: 06/21/2022 CLINICAL DATA:  42 year old female with new palpable LEFT breast lump identified on  self-examination. EXAM: DIGITAL DIAGNOSTIC UNILATERAL LEFT MAMMOGRAM WITH TOMOSYNTHESIS; ULTRASOUND LEFT BREAST LIMITED TECHNIQUE: Left digital diagnostic mammography and breast tomosynthesis was performed.; Targeted ultrasound examination of the left breast was performed. COMPARISON:  None available. ACR Breast Density Category c: The breast tissue is heterogeneously dense, which may obscure small masses. FINDINGS: Full field, spot compression and magnification views of the LEFT breast demonstrate a new partially circumscribed partially irregular mass containing pleomorphic calcifications within the UPPER-OUTER LEFT breast. Targeted ultrasound is performed, showing a 2.7 x 1.9 x 1.9 cm irregular hypoechoic mass at the 1 o'clock position of the LEFT breast 10 cm from the nipple corresponding to the patient's palpable abnormality. Two axillary lymph nodes with cortical thickening noted. IMPRESSION: 1. Suspicious 2.7 cm UPPER-OUTER LEFT breast mass and 2 abnormal LEFT axillary lymph nodes with cortical thickening. Tissue sampling of the LEFT breast mass and 1 of the abnormal LEFT axillary lymph nodes recommended. RECOMMENDATION: Ultrasound-guided biopsies of the LEFT breast mass and a LEFT axillary lymph node, which will be scheduled. I have discussed the findings and recommendations with the patient. If applicable, a reminder letter will be sent to the patient regarding the next appointment. BI-RADS CATEGORY  4: Suspicious. Electronically Signed: By: Margarette Canada M.D. On: 06/21/2022 09:18      IMPRESSION/PLAN: 1. 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 Dr. Lisbeth Renshaw discusses the pathology findings and reviews the nature of left breast disease. The patient may be a candidate for neoadjuvant chemotherapy followed by breast conservation with lumpectomy with sentinel node biopsy, but she is also contemplating bilateral mastectomies and is  seeing plastic surgery and genetics. She will meet with Dr. Chryl Heck tomorrow and will discuss systemic therapy. We discussed the role ofexternal radiotherapy to the breast if she has lumpectomy to reduce risks of local recurrence. She is also aware of the criteria for post mastectomy radiation if she pursues mastectomy. We discussed the risks, benefits, short, and long term effects of radiotherapy, as well as the curative intent, and the patient is interested in proceeding. Dr. Lisbeth Renshaw discusses the delivery and logistics of radiotherapy and anticipates a course of up to 6 1/2 weeks of radiotherapy. We will see her back a few weeks after surgery to discuss the simulation process and anticipate we starting radiotherapy about 4-6 weeks after surgery.  2. Possible genetic predisposition to malignancy. The patient is a candidate for genetic testing given her personal and family history. She will meet with our geneticist tomorrow. 3. Contraceptive Counseling. She is  not active at this time, and would need negative pregnancy testing if she became active prior to any radiation.   In a visit lasting 60 minutes, greater than 50% of the time was spent face to face reviewing her case, as well as in preparation of, discussing, and coordinating the patient's care.  The above documentation reflects my direct findings during this shared patient visit. Please see the separate note by Dr. Lisbeth Renshaw on this date for the remainder of the patient's plan of care.    Carola Rhine, [facility]    **Disclaimer: This note was dictated with voice recognition software. Similar sounding words can inadvertently be transcribed and this note may contain transcription errors which may not have been corrected upon publication of note.**

## 2022-06-28 NOTE — Progress Notes (Signed)
New Breast Cancer Diagnosis:Left Breast UOQ  Did patient present with symptoms (if so, please note symptoms) or screening mammography?:Palpable mass    Location and Extent of disease :left breast. Located at 1 o'clock position, measured 2.7 cm in greatest dimension. Adenopathy no.  Histology per Pathology Report: grade 3, Invasive Ductal Carcinoma 06/23/2022  Receptor Status: ER(positive, weak staining), PR (negative), Her2-neu (negative), Ki-(95%)   Surgeon and surgical plan, if any:  Dr. Marlou Starks 06/27/2022 -The patient appears to have a 2.7 cm cancer in the upper outer quadrant of the left breast with clinically negative nodes.  -Her tumor markers have not been reported yet. At this point I will refer her to medical and radiation oncology to discuss adjuvant versus neoadjuvant therapy.  -Given her age she will also need genetic testing.  -I will also send her to physical therapy for preoperative evaluation  -She will be thinking about lumpectomy versus mastectomy and which way she would prefer to go. -If she has unfavorable markers then she may benefit from neoadjuvant chemotherapy. If she has all favorable markers then she may go to surgery first.    Medical oncologist, treatment if any:     Family History of Breast/Ovarian/Prostate Cancer: Maternal Cousin had breast  Lymphedema issues, if any: None     Pain issues, if any: Tenderness in her breast since her biopsy.    SAFETY ISSUES: Prior radiation? No Pacemaker/ICD? No Possible current pregnancy? Having Periods-Regular/heavy Is the patient on methotrexate? No  Current Complaints / other details:

## 2022-06-28 NOTE — Therapy (Unsigned)
OUTPATIENT PHYSICAL THERAPY BREAST CANCER BASELINE EVALUATION   Patient Name: Nicole Maddox MRN: [id number] DOB:Dec 28, 1980, 42 y.o., female Today's Date: 06/29/2022  END OF SESSION:  PT End of Session - 06/29/22 1054     Visit Number 1    Number of Visits 2    Date for PT Re-Evaluation 10/19/22    PT Start Time 1055    PT Stop Time 1142    PT Time Calculation (min) 47 min    Activity Tolerance Patient tolerated treatment well    Behavior During Therapy [facility] for tasks assessed/performed             Past Medical History:  Diagnosis Date   Breast cancer ([place]) 06/23/2022   Hyperthyroidism    Thyroid disease    Past Surgical History:  Procedure Laterality Date   BREAST BIOPSY Left 06/23/2022   [place] LT BREAST BX W LOC DEV 1ST LESION IMG BX [place] US GUIDE 06/23/2022 [facility] MAMMOGRAPHY   WISDOM TOOTH EXTRACTION     Patient Active Problem List   Diagnosis Date Noted   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 ([place]) 06/28/2022    PCP: Marjorie Smolder MD  REFERRING PROVIDER: Autumn Messing III, MD  REFERRING DIAG: Left Breast Cancer  THERAPY DIAG:  Malignant neoplasm of upper-outer quadrant of left female breast, unspecified estrogen receptor status ([place])  Abnormal posture  Rationale for Evaluation and Treatment: Rehabilitation  ONSET DATE: 06/27/2022  SUBJECTIVE:                                                                                                                                                                                           SUBJECTIVE STATEMENT: Patient reports she is here today to be seen by her medical team for her newly diagnosed left breast cancer.   PERTINENT HISTORY:  Patient was diagnosed on 06/27/2022 with left grade 3 IDC . It measures 2.7 cm and is located in the upper-outer quadrant. It is ER 30%+, PR-, Her 2 - with a Ki67 of 95%.   PATIENT GOALS:   reduce lymphedema risk and learn post op HEP.   PAIN:   Are you having pain? No  PRECAUTIONS: Active CA   HAND DOMINANCE: right  WEIGHT BEARING RESTRICTIONS: No  FALLS:  Has patient fallen in last 6 months? No  LIVING ENVIRONMENT: Patient lives with: alone Lives in: House/apartment Has following equipment at home: None  OCCUPATION: Mental Health Therapist  LEISURE: read, shopping,personal trainer 2 times per week.  PRIOR LEVEL OF FUNCTION: Independent   OBJECTIVE:  COGNITION: Overall cognitive status: Within functional limits for tasks assessed  POSTURE:  Forward head and rounded shoulders posture  UPPER EXTREMITY AROM/PROM:  A/PROM RIGHT   eval   Shoulder extension 51  Shoulder flexion 150  Shoulder abduction 180  Shoulder internal rotation 60  Shoulder external rotation 100    (Blank rows = not tested)  A/PROM LEFT   eval  Shoulder extension 52  Shoulder flexion 160  Shoulder abduction 180  Shoulder internal rotation 53  Shoulder external rotation 97    (Blank rows = not tested)  CERVICAL AROM: All within functional limits:    UPPER EXTREMITY STRENGTH: WNL  LYMPHEDEMA ASSESSMENTS:   LANDMARK RIGHT   eval  10 cm proximal to olecranon process 29  Olecranon process 25.0  10 cm proximal to ulnar styloid process 21.4  Just proximal to ulnar styloid process 14.9  Across hand at thumb web space 18.7  At base of 2nd digit 5.5  (Blank rows = not tested)  LANDMARK LEFT   eval  10 cm proximal to olecranon process 28.3  Olecranon process 25.2  10 cm proximal to ulnar styloid process 21.1  Just proximal to ulnar styloid process 14.7  Across hand at thumb web space 18.15  At base of 2nd digit 5.5  (Blank rows = not tested)  L-DEX LYMPHEDEMA SCREENING:  The patient was assessed using the L-Dex machine today to produce a lymphedema index baseline score. The patient will be reassessed on a regular basis (typically every 3 months) to obtain new L-Dex scores. If the score is > 6.5 points away from  his/her baseline score indicating onset of subclinical lymphedema, it will be recommended to wear a compression garment for 4 weeks, 12 hours per day and then be reassessed. If the score continues to be > 6.5 points from baseline at reassessment, we will initiate lymphedema treatment. Assessing in this manner has a 95% rate of preventing clinically significant lymphedema.   L-DEX FLOWSHEETS - 06/29/22 1100       L-DEX LYMPHEDEMA SCREENING   Measurement Type Unilateral    L-DEX MEASUREMENT EXTREMITY Upper Extremity    POSITION  Standing    DOMINANT SIDE Right    At Risk Side Left    BASELINE SCORE (UNILATERAL) 0.4    Comment 165, wore right wrist bracelet             QUICK DASH SURVEY: 2.27  PATIENT EDUCATION:  Education details: Lymphedema risk reduction and post op shoulder/posture HEP, Compression BRA, [place] screens, ABC class Person educated: Patient Education method: Explanation, Demonstration, Handout Education comprehension: Patient verbalized understanding and returned demonstration  HOME EXERCISE PROGRAM: Patient was instructed today in a home exercise program today for post op shoulder range of motion. These included active assist shoulder flexion in sitting, scapular retraction, wall walking with shoulder abduction, and hands behind head external rotation.  She was encouraged to do these twice a day, holding 3 seconds and repeating 5 times when permitted by her physician.   ASSESSMENT:  CLINICAL IMPRESSION: Pts. multidisciplinary medical team met prior to her assessments to determine a recommended treatment plan. Marland Kitchen She will benefit from a post op PT reassessment to determine needs and from L-Dex screens every 3 months for 2 years to detect subclinical lymphedema. Pt was diagnosed on 06/27/2022 with left IDC Breast Cancer that is ER30%, PR-, Her 2 -. With a Ki67 of 95% She is meeting with her oncologist today to determine the treatment plan. She does not have a date yet  for surgery. She is travelling to [facility]  this weekend to tell her family members. She will notify us when she has a surgery date so we may schedule a follow up.  Pts. multidisciplinary medical team met prior to her assessments to determine a recommended treatment plan. Marland Kitchen She will benefit from a post op PT reassessment to determine needs and from L-Dex screens every 3 months for 2 years to detect subclinical lymphedema.  Pt will benefit from skilled therapeutic intervention to improve on the following deficits: Decreased knowledge of precautions, impaired UE functional use, pain, decreased ROM, postural dysfunction.   PT treatment/interventions: ADL/self-care home management, pt/family education, therapeutic exercise  REHAB POTENTIAL: Excellent  CLINICAL DECISION MAKING: Stable/uncomplicated  EVALUATION COMPLEXITY: Low   GOALS: Goals reviewed with patient? YES  LONG TERM GOALS: (STG=LTG)    Name Target Date Goal status  1 Pt will be able to verbalize understanding of pertinent lymphedema risk reduction practices relevant to her dx specifically related to skin care.  Baseline:  No knowledge 06/29/2022 Achieved at eval  2 Pt will be able to return demo and/or verbalize understanding of the post op HEP related to regaining shoulder ROM. Baseline:  No knowledge 06/29/2022 Achieved at eval  3 Pt will be able to verbalize understanding of the importance of attending the post op After Breast CA Class for further lymphedema risk reduction education and therapeutic exercise.  Baseline:  No knowledge 06/29/2022 Achieved at eval  4 Pt will demo she has regained full shoulder ROM and function post operatively compared to baselines.  Baseline: See objective measurements taken today. 10/19/2022     PLAN:  PT FREQUENCY/DURATION: EVAL and 1 follow up appointment.   PLAN FOR NEXT SESSION: will reassess 3-4 weeks post op to determine needs.   Patient will follow up at outpatient cancer rehab 3-4 weeks  following surgery.  If the patient requires physical therapy at that time, a specific plan will be dictated and sent to the referring physician for approval. The patient was educated today on appropriate basic range of motion exercises to begin post operatively and the importance of attending the After Breast Cancer class following surgery.  Patient was educated today on lymphedema risk reduction practices as it pertains to recommendations that will benefit the patient immediately following surgery.  She verbalized good understanding.    Physical Therapy Information for After Breast Cancer Surgery/Treatment:  Lymphedema is a swelling condition that you may be at risk for in your arm if you have lymph nodes removed from the armpit area.  After a sentinel node biopsy, the risk is approximately 5-9% and is higher after an axillary node dissection.  There is treatment available for this condition and it is not life-threatening.  Contact your physician or physical therapist with concerns. You may begin the 4 shoulder/posture exercises (see additional sheet) when permitted by your physician (typically a week after surgery).  If you have drains, you may need to wait until those are removed before beginning range of motion exercises.  A general recommendation is to not lift your arms above shoulder height until drains are removed.  These exercises should be done to your tolerance and gently.  This is not a "no pain/no gain" type of recovery so listen to your body and stretch into the range of motion that you can tolerate, stopping if you have pain.  If you are having immediate reconstruction, ask your plastic surgeon about doing exercises as he or she may want you to wait. We encourage you to attend the free one  time ABC (After Breast Cancer) class offered by [facility].  You will learn information related to lymphedema risk, prevention and treatment and additional exercises to regain  mobility following surgery.  You can call [phone] for more information.  This is offered the 1st and 3rd Monday of each month.  You only attend the class one time. While undergoing any medical procedure or treatment, try to avoid blood pressure being taken or needle sticks from occurring on the arm on the side of cancer.   This recommendation begins after surgery and continues for the rest of your life.  This may help reduce your risk of getting lymphedema (swelling in your arm). An excellent resource for those seeking information on lymphedema is the National Lymphedema Network's web site. It can be accessed at [place].org If you notice swelling in your hand, arm or breast at any time following surgery (even if it is many years from now), please contact your doctor or physical therapist to discuss this.  Lymphedema can be treated at any time but it is easier for you if it is treated early on.  If you feel like your shoulder motion is not returning to normal in a reasonable amount of time, please contact your surgeon or physical therapist.  [place] [phone]. [address], [place] [address]  ABC CLASS After Breast Cancer Class  After Breast Cancer Class is a specially designed exercise class to assist you in a safe recover after having breast cancer surgery.  In this class you will learn how to get back to full function whether your drains were just removed or if you had surgery a month ago.  This one-time class is held the 1st and 3rd Monday of every month from 11:00 a.m. until 12:00 noon virtually.  This class is FREE and space is limited. For more information or to register for the next available class, call [phone].  Class Goals  Understand specific stretches to improve the flexibility of you chest and shoulder. Learn ways to safely strengthen your upper body and improve your posture. Understand the warning signs of  infection and why you may be at risk for an arm infection. Learn about Lymphedema and prevention.  ** You do not attend this class until after surgery.  Drains must be removed to participate  Patient was instructed today in a home exercise program today for post op shoulder range of motion. These included active assist shoulder flexion in sitting, scapular retraction, wall walking with shoulder abduction, and hands behind head external rotation.  She was encouraged to do these twice a day, holding 3 seconds and repeating 5 times when permitted by her physician.    Claris Pong, PT 06/29/2022, 11:45 AM

## 2022-06-29 ENCOUNTER — Ambulatory Visit: Payer: Managed Care, Other (non HMO) | Attending: General Surgery

## 2022-06-29 ENCOUNTER — Other Ambulatory Visit: Payer: Self-pay

## 2022-06-29 ENCOUNTER — Inpatient Hospital Stay: Payer: Managed Care, Other (non HMO) | Attending: Hematology and Oncology | Admitting: Hematology and Oncology

## 2022-06-29 ENCOUNTER — Encounter: Payer: Self-pay | Admitting: Genetic Counselor

## 2022-06-29 ENCOUNTER — Inpatient Hospital Stay: Payer: Managed Care, Other (non HMO)

## 2022-06-29 ENCOUNTER — Inpatient Hospital Stay (HOSPITAL_BASED_OUTPATIENT_CLINIC_OR_DEPARTMENT_OTHER): Payer: Managed Care, Other (non HMO) | Admitting: Genetic Counselor

## 2022-06-29 ENCOUNTER — Other Ambulatory Visit: Payer: Self-pay | Admitting: Genetic Counselor

## 2022-06-29 ENCOUNTER — Encounter: Payer: Self-pay | Admitting: Hematology and Oncology

## 2022-06-29 VITALS — BP 126/76 | HR 103 | Temp 97.7°F | Resp 17 | Wt 166.1 lb

## 2022-06-29 DIAGNOSIS — Z79899 Other long term (current) drug therapy: Secondary | ICD-10-CM | POA: Insufficient documentation

## 2022-06-29 DIAGNOSIS — Z800 Family history of malignant neoplasm of digestive organs: Secondary | ICD-10-CM | POA: Insufficient documentation

## 2022-06-29 DIAGNOSIS — C50412 Malignant neoplasm of upper-outer quadrant of left female breast: Secondary | ICD-10-CM | POA: Diagnosis not present

## 2022-06-29 DIAGNOSIS — Z808 Family history of malignant neoplasm of other organs or systems: Secondary | ICD-10-CM | POA: Insufficient documentation

## 2022-06-29 DIAGNOSIS — R293 Abnormal posture: Secondary | ICD-10-CM | POA: Diagnosis present

## 2022-06-29 DIAGNOSIS — Z803 Family history of malignant neoplasm of breast: Secondary | ICD-10-CM

## 2022-06-29 DIAGNOSIS — Z171 Estrogen receptor negative status [ER-]: Secondary | ICD-10-CM | POA: Diagnosis not present

## 2022-06-29 LAB — GENETIC SCREENING ORDER

## 2022-06-29 NOTE — Progress Notes (Signed)
REFERRING PROVIDER: Jovita Kussmaul, MD [place],  [place] [id number]  PRIMARY PROVIDER:  [name], MD  PRIMARY REASON FOR VISIT:  1. Family history of stomach cancer   2. 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 female, was seen for a [place] cancer genetics consultation at the request of Dr. Marlou Starks due to a personal and family history of cancer.  Nicole Maddox presents to clinic today to discuss the possibility of a hereditary predisposition to cancer, genetic testing, and to further clarify her future cancer risks, as well as potential cancer risks for family members.   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  The tumor is ER+/PR-/Her2-.       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  First live birth at age N/A.  OCP use for approximately  <5  years.  Ovaries intact: yes.  Hysterectomy: no.  Menopausal status: premenopausal.  HRT use: 0 years. Colonoscopy: no; normal. Mammogram within the last year: yes. Number of breast biopsies: 2. Up to date with pelvic exams: yes. Any excessive radiation exposure in the past: no  Past Medical History:  Diagnosis Date   Breast cancer ([place]) 06/23/2022   Family history of stomach cancer     Hyperthyroidism    Thyroid disease     Past Surgical History:  Procedure Laterality Date   BREAST BIOPSY Left 06/23/2022   [place] LT BREAST BX W LOC DEV 1ST LESION IMG BX [place] US GUIDE 06/23/2022 [facility] MAMMOGRAPHY   WISDOM TOOTH EXTRACTION      Social History   Socioeconomic History   Marital status: Single    Spouse name: Not on file   Number of children: Not on file   Years of education: Not on file   Highest education level: Not on file  Occupational History   Not on file  Tobacco Use   Smoking status: Never   Smokeless tobacco: Never  Vaping Use   Vaping Use: Never used  Substance and Sexual Activity   Alcohol use: Yes    Comment: socially   Drug use: No   Sexual activity: Yes    Partners: Male    Birth control/protection: None    Comment: intercourse age 85, more than 6 sexual parters,   Other Topics Concern   Not on file  Social History Narrative   Not on file   Social Determinants of Health   Financial Resource Strain: Not on file  Food Insecurity: No Food Insecurity (06/28/2022)   Hunger Vital Sign    Worried About Running Out of Food in the Last Year: Never true    Ran Out of Food in the Last Year: Never true  Transportation Needs: No Transportation Needs (06/28/2022)  PRAPARE - Hydrologist (Medical): No    Lack of Transportation (Non-Medical): No  Physical Activity: Not on file  Stress: Not on file  Social Connections: Not on file     FAMILY HISTORY:  We obtained a detailed, 4-generation family history.  Significant diagnoses are listed below: Family History  Problem Relation Age of Onset   Heart attack Father 59   Stomach cancer Maternal Aunt        dx > 50   Throat cancer Maternal Uncle        dx 73s   Stomach cancer Maternal Uncle        dx > 50   Heart Problems Maternal Grandmother    Breast cancer Cousin        mother's maternal first cousin      The patient does not have children.  She has a maternal half  sister and two paternal half sisters and brother.  Her maternal half sister died at age 46.  Her father is deceased and her mother is living.  The patient's mother has four brothers and two sisters.  One brother had a head/neck cancer, one brother and one sister both had stomach cancer with unknown histology.  One brother has a daughter with an unknown cancer in her 18's.  The maternal grandparents did not have cancer.  The patient's father had a heart attack and motor vehicle accident at age 36.  He has multiple siblings but no information is known at this time.  The patient will get back to me in the next couple weeks.  Nicole Maddox is unaware of previous family history of genetic testing for hereditary cancer risks. Patient's maternal ancestors are of African American descent, and paternal ancestors are of African American descent. There is no reported Ashkenazi Jewish ancestry. There is no known consanguinity.  GENETIC COUNSELING ASSESSMENT: Nicole Maddox is a 42 y.o. female with a personal and family history of cancer which is somewhat suggestive of a hereditary cancer syndrome and predisposition to cancer given her young age of onset of her breast cancer. We, therefore, discussed and recommended the following at today's visit.   DISCUSSION: We discussed that, in general, most cancer is not inherited in families, but instead is sporadic or familial. Sporadic cancers occur by chance and typically happen at older ages (>50 years) as this type of cancer is caused by genetic changes acquired during an individual's lifetime. Some families have more cancers than would be expected by chance; however, the ages or types of cancer are not consistent with a known genetic mutation or known genetic mutations have been ruled out. This type of familial cancer is thought to be due to a combination of multiple genetic, environmental, hormonal, and lifestyle factors. While this combination of factors likely increases the risk  of cancer, the exact source of this risk is not currently identifiable or testable.  We discussed that 5 - 10% of breast cancer is hereditary, with most cases associated with BRCA mutations.  There are other genes that can be associated with hereditary breast cancer syndromes.  These include ATM, CHEK2 and PALB2.  We discussed that testing is beneficial for several reasons including knowing how to follow individuals after completing their treatment, and understand if other family members could be at risk for cancer and allow them to undergo genetic testing.   We reviewed the characteristics, features and inheritance patterns of hereditary cancer syndromes. We also discussed genetic testing, including the  appropriate family members to test, the process of testing, insurance coverage and turn-around-time for results. We discussed the implications of a negative, positive and/or variant of uncertain significant result. In order to get genetic test results in a timely manner so that Nicole Maddox can use these genetic test results for surgical decisions, we recommended Nicole Maddox pursue genetic testing for the BRCA STAT panel. Once complete, we recommend Nicole Maddox pursue reflex genetic testing to the Multi-cancer gene panel+RNA.  The Multi-Cancer + RNA Panel offered by [facility] includes sequencing and/or deletion/duplication analysis of the following 70 genes:  AIP*, ALK, APC*, ATM*, AXIN2*, BAP1*, BARD1*, BLM*, BMPR1A*, BRCA1*, BRCA2*, BRIP1*, CDC73*, CDH1*, CDK4, CDKN1B*, CDKN2A, CHEK2*, CTNNA1*, DICER1*, EPCAM (del/dup only), EGFR, FH*, FLCN*, GREM1 (promoter dup only), HOXB13, KIT, LZTR1, MAX*, MBD4, MEN1*, MET, MITF, MLH1*, MSH2*, MSH3*, MSH6*, MUTYH*, NF1*, NF2*, NTHL1*, PALB2*, PDGFRA, PMS2*, POLD1*, POLE*, POT1*, PRKAR1A*, PTCH1*, PTEN*, RAD51C*, RAD51D*, RB1*, RET, SDHA* (sequencing only), SDHAF2*, SDHB*, SDHC*, SDHD*, SMAD4*, SMARCA4*, SMARCB1*, SMARCE1*, STK11*, SUFU*, TMEM127*, TP53*, TSC1*, TSC2*, VHL*. RNA  analysis is performed for * genes.   Based on Ms. Buccheri's personal and family history of cancer, she meets medical criteria for genetic testing. Despite that she meets criteria, she may still have an out of pocket cost.   We discussed that some people do not want to undergo genetic testing due to fear of genetic discrimination.  The Genetic Information Nondiscrimination Act (GINA) was signed into federal law in 2008. GINA prohibits health insurers and most employers from discriminating against individuals based on genetic information (including the results of genetic tests and family history information). According to GINA, health insurance companies cannot consider genetic information to be a preexisting condition, nor can they use it to make decisions regarding coverage or rates. GINA also makes it illegal for most employers to use genetic information in making decisions about hiring, firing, promotion, or terms of employment. It is important to note that GINA does not offer protections for life insurance, disability insurance, or long-term care insurance. GINA does not apply to those in the [employer], those who work for companies with less than 15 employees, and new life insurance or long-term disability insurance policies.  Health status due to a cancer diagnosis is not protected under GINA. More information about GINA can be found by visiting NightAgenda.se.     PLAN: After considering the risks, benefits, and limitations, Nicole Maddox provided informed consent to pursue genetic testing and the blood sample was sent to [facility] for analysis of the STAT and Multi-Cancer+RNA panel. Results should be available within approximately 2-3 weeks' time, at which point they will be disclosed by telephone to Nicole Maddox, as will any additional recommendations warranted by these results. Nicole Maddox will receive a summary of her genetic counseling visit and a copy of her results once available. This information  will also be available in Epic.   Lastly, we encouraged Nicole Maddox to remain in contact with cancer genetics annually so that we can continuously update the family history and inform her of any changes in cancer genetics and testing that may be of benefit for this family.   Nicole Maddox questions were answered to her satisfaction today. Our contact information was provided should additional questions or concerns arise. Thank you for the referral and allowing [place] to share in the care of your patient.   Ankush Gintz P. [place]ne [place], [place], [facility] Licensed, Insurance risk surveyor [email or web address] phone: [phone]  The patient was seen for a total of 40 minutes  in face-to-face genetic counseling.  The patient was seen alone.  Drs. Michell Heinrich, and/or [place] were available for questions, if needed..    _______________________________________________________________________ For Office Staff:  Number of people involved in session: 1 Was an Intern/ student involved with case: no

## 2022-06-29 NOTE — Assessment & Plan Note (Addendum)
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  She denies any family history of breast cancer or ovarian cancer in the immediate family, may have a second cousin with breast cancer.  She is very healthy at baseline, works as a Transport planner.  She is nulliparous, denies any current use of hormone replacement or birth control.  She noticed this lump back in end of December and went for diagnostic mammogram which led to ultrasound and biopsy.  Prior to this she also had some palpable lumps which were thought to be complex cysts, benign. On physical exam, there is a palpable mass in the left breast upper outer quadrant close to the axillary tail measuring around 3 cm in largest dimension.  There are also some other palpable abnormal densities in the left breast which were previously thought to be cysts.  I think she will be an excellent candidate for neoadjuvant therapy given large tumor size despite no definitive evidence of lymph node involvement especially with her high-grade, high proliferation index invasive ductal carcinoma which is functionally triple negative.I have discussed the regimen very clearly, mechanism of action of chemo, adverse effects including but not limited to fatigue, nausea, vomiting, diarrhea, increased risk of infections, neuropathy, cardiotoxicity.  She will also be scheduled for a chemo class.  She will benefit from a preop MRI and Port-A-Cath placement.  I hope to give her immunotherapy if her PD-L1 score comes back positive based on information from keynote 756 study. There is greater than 50% chance of having a complete pathologic response in the setting.  She understands that having PCR could provide prognostic information as well.  Post surgery she may need adjuvant  radiation and anti estrogen therapy. She is agreeable to the neoadjuvant recommendations.  Anticipate Port-A-Cath placement, preop MRI in the next 1 to 2 weeks and hopefully we can initiate chemotherapy in the next couple weeks.  Thank you for consulting [place] the care of this patient.  Please not hesitate to contact us with any additional questions or concerns

## 2022-06-29 NOTE — Progress Notes (Signed)
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  She denies any family history of breast cancer or ovarian cancer in the immediate family, may have a second cousin with breast cancer.  She is very healthy at baseline, works as a Transport planner.  She is nulliparous, denies any current use of hormone replacement or birth control.  She noticed this lump back in end of December and went for diagnostic mammogram which led to ultrasound and biopsy.  Prior to this she also had some palpable lumps which were thought to be complex cysts, benign. On physical exam, there is a palpable mass in the left breast upper outer quadrant close to the axillary tail measuring around 3 cm in largest dimension.  There are also some other palpable abnormal densities in the left breast which were previously thought to be cysts.  I think she will be an excellent candidate for neoadjuvant therapy given large tumor size despite no definitive evidence of lymph node involvement especially with her high-grade, high proliferation index invasive ductal carcinoma which is functionally triple negative.I have discussed the regimen very clearly, mechanism of action of chemo, adverse effects including but not limited to fatigue, nausea, vomiting, diarrhea, increased risk of infections, neuropathy, cardiotoxicity.  She will also be scheduled for a chemo class.  She will benefit  from a preop MRI and Port-A-Cath placement.  I hope to give her immunotherapy if her PD-L1 score comes back positive based on information from keynote 756 study. There is greater than 50% chance of having a complete pathologic response in the setting.  She understands that having PCR could provide prognostic information as well.  Post surgery she may need adjuvant radiation and anti estrogen therapy. She is agreeable to the neoadjuvant recommendations.  Anticipate Port-A-Cath placement, preop MRI in the next 1 to 2 weeks and hopefully we can initiate chemotherapy in the next couple weeks.  Thank you for consulting [place] the care of this patient.  Please not hesitate to contact us with any additional questions or concerns  Orders Placed This Encounter  Procedures   ECHOCARDIOGRAM COMPLETE    Standing Status:   Future    Standing Expiration Date:   06/30/2023    Order Specific Question:   Where should this test be performed    Answer:   [facility]    Order Specific Question:   Perflutren DEFINITY (image enhancing agent) should be administered unless hypersensitivity or allergy exist    Answer:   Administer Perflutren    Order Specific Question:   Is a special reader required? (athlete or structural heart)    Answer:   No    Order Specific Question:   Does this study need to be read by the Structural team/Level 3 readers?    Answer:   No    Order Specific Question:   Reason for exam-Echo    Answer:   Chemo  Z09     HISTORY OF PRESENTING ILLNESS:  Nicole Maddox 42 y.o. female is here  because of BC.  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  This showed a partially circumscribed small mass in the left breast  with no other defined masses or architectural distortion, no significant areas of asymmetry.  Targeted ultrasound was performed which showed several cysts without significant complicating features in the medial left breast largest at 933 cm from the nipple measuring 1.3 x 1.2 x 1.2.  No solid masses or suspicious lesions.  She also was found to have benign left breast cysts.  She then went back in January with a palpable left breast lump which once again showed suspicious 2.7 cm upper outer left breast mass and 2 abnormal left axillary lymph nodes with cortical thickening.  Tissue sampling of the left breast mass in one of the abnormal left axillary lymph nodes recommended  Left breast needle core biopsy at 1:00 10 cm from the nipple showed high-grade invasive ductal carcinoma, axillary tail with lymph node and reactive germinal centers, no metastatic carcinoma identified.  Prognostics from the 1:00 left breast needle core biopsy showed ER 30% positive weak staining intensity PR 0% negative, Ki-67 of 95% negative for HER2 0  She was seen by Dr. Marlou Starks and referred to medical oncology for neoadjuvant recommendations.  She is very healthy at baseline, denies any family history of breast cancer, ovarian cancer.  She lives in [place] but wants to continue care in [place].  Rest of the pertinent 10 point ROS reviewed and negative.  MEDICAL HISTORY:  Past Medical History:  Diagnosis Date   Breast cancer ([place]) 06/23/2022   Family history of stomach cancer    Hyperthyroidism    Thyroid disease     SURGICAL HISTORY: Past Surgical History:  Procedure Laterality Date   BREAST BIOPSY Left 06/23/2022   [place] LT BREAST BX W LOC DEV 1ST LESION IMG BX SPEC US GUIDE 06/23/2022 [facility] M[facility]MOGR[facility]   WISDOM TOOTH EXTRACTION      SOCIAL HISTORY: Social History   Socioeconomic History   Marital status: Single    Spouse name: Not on file   Number of children: Not on file   Years of education: Not on file    Highest education level: Not on file  Occupational History   Not on file  Tobacco Use   Smoking status: Never   Smokeless tobacco: Never  Vaping Use   Vaping Use: Never used  Substance and Sexual Activity   Alcohol use: Yes    Comment: socially   Drug use: No   Sexual activity: Yes    Partners: Male    Birth control/protection: None    Comment: intercourse age 56, more than 6 sexual parters,   Other Topics Concern   Not on file  Social History Narrative   Not on file   Social Determinants of Health   Financial Resource Strain: Not on file  Food Insecurity: No Food Insecurity (06/28/2022)   Hunger Vital Sign    Worried About Running Out of Food in the Last Year: Never true    Ran Out of Food in the Last Year: Never true  Transportation Needs: No Transportation Needs (06/28/2022)   PRAPARE - Hydrologist (Medical): No    Lack of Transportation (Non-Medical):  No  Physical Activity: Not on file  Stress: Not on file  Social Connections: Not on file  Intimate Partner Violence: Not At Risk (06/28/2022)   Humiliation, Afraid, Rape, and Kick questionnaire    Fear of Current or Ex-Partner: No    Emotionally Abused: No    Physically Abused: No    Sexually Abused: No    FAMILY HISTORY: Family History  Problem Relation Age of Onset   Heart attack Father 36   Stomach cancer Maternal Aunt        dx > 50   Throat cancer Maternal Uncle        dx 61s   Stomach cancer Maternal Uncle        dx > 50   Heart Problems Maternal Grandmother    Breast cancer Cousin        mother's maternal first cousin    ALLERGIES:  has No Known Allergies.  MEDICATIONS:  Current Outpatient Medications  Medication Sig Dispense Refill   FERROCITE 324 MG TABS tablet Take 1 tablet by mouth daily.     folic acid (FOLVITE) 1 MG tablet Take 1 mg by mouth daily.     ketoconazole (NIZORAL) 2 % shampoo Apply topically.     methimazole (TAPAZOLE) 5 MG tablet Take 15 mg by mouth  every morning.     Multiple Vitamin (MULTIVITAMIN PO) Take by mouth.     omeprazole (PRILOSEC) 40 MG capsule Take 40 mg by mouth daily.     PREBIOTIC PRODUCT PO Take by mouth.     Relugolix-Estradiol-Norethind (MYFEMBREE) 40-1-0.5 MG TABS Take 1 tablet by mouth daily. (Patient not taking: Reported on 06/28/2022) 28 tablet 3   No current facility-administered medications for this visit.     PHYSICAL EXAMINATION: ECOG PERFORMANCE STATUS: 0 - Asymptomatic  Vitals:   06/29/22 1252  BP: 126/76  Pulse: (!) 103  Resp: 17  Temp: 97.7 F (36.5 C)  SpO2: 100%   Filed Weights   06/29/22 1252  Weight: 166 lb 1.6 oz (75.3 kg)    GENERAL:alert, no distress and comfortable Neck: No palpable cervical adenopathy Breast: Bilateral breasts inspected and palpated.  Palpable cyst noted in bilateral breast.  The mass of concern is in the left upper outer quadrant closer to the tail measuring around 3 cm in largest dimension.  No skin changes.  No nipple changes.  LABORATORY DATA:  I have reviewed the data as listed No results found for: "WBC", "HGB", "HCT", "MCV", "PLT"   Chemistry   No results found for: "NA", "K", "CL", "CO2", "BUN", "CREATININE", "GLU" No results found for: "CALCIUM", "ALKPHOS", "AST", "ALT", "BILITOT"     RADIOGRAPHIC STUDIES: I have personally reviewed the radiological images as listed and agreed with the findings in the report. [place] LT BREAST BX W LOC DEV 1ST LESION IMG BX SPEC US GUIDE  Result Date: 06/23/2022 CLINICAL DATA:  42 year old female presenting for biopsy of a left breast mass and a left axillary lymph node. EXAM: ULTRASOUND GUIDED LEFT BREAST CORE NEEDLE BIOPSY [place] AXILLARY NODE CORE BIOPSY LEFT COMPARISON:  Previous exam(s). PROCEDURE: I met with the patient and we discussed the procedure of ultrasound-guided biopsy, including benefits and alternatives. We discussed the high likelihood of a successful procedure. We discussed the risks of the procedure, including  infection, bleeding, tissue injury, clip migration, and inadequate sampling. Informed written consent was given. The usual time-out protocol was performed immediately prior to the procedure. Lesion quadrant: Upper outer quadrant Using sterile technique and 1%  Lidocaine as local anesthetic, under direct ultrasound visualization, a 14 gauge spring-loaded device was used to perform biopsy of a mass in the left breast at 1 o'clock using a lateral approach. At the conclusion of the procedure coil shaped tissue marker clip was deployed into the biopsy cavity. Follow up 2 view mammogram was performed and dictated separately. Left axilla Using sterile technique and 1% Lidocaine as local anesthetic, under direct ultrasound visualization, a 14 gauge spring-loaded device was used to perform biopsy of a left axillary lymph node using a lateral approach. At the conclusion of the procedure a HydroMARK shaped tissue marker clip was deployed into the biopsy cavity. Follow up 2 view mammogram was performed and dictated separately. IMPRESSION: Ultrasound guided biopsy of a mass in the left breast at 1 o'clock and of a left axillary lymph node. No apparent complications. Electronically Signed   By: Audie Pinto M.D.   On: 06/23/2022 12:06  [place] AXILLARY NODE CORE BIOPSY LEFT  Result Date: 06/23/2022 CLINICAL DATA:  42 year old female presenting for biopsy of a left breast mass and a left axillary lymph node. EXAM: ULTRASOUND GUIDED LEFT BREAST CORE NEEDLE BIOPSY [place] AXILLARY NODE CORE BIOPSY LEFT COMPARISON:  Previous exam(s). PROCEDURE: I met with the patient and we discussed the procedure of ultrasound-guided biopsy, including benefits and alternatives. We discussed the high likelihood of a successful procedure. We discussed the risks of the procedure, including infection, bleeding, tissue injury, clip migration, and inadequate sampling. Informed written consent was given. The usual time-out protocol was performed immediately  prior to the procedure. Lesion quadrant: Upper outer quadrant Using sterile technique and 1% Lidocaine as local anesthetic, under direct ultrasound visualization, a 14 gauge spring-loaded device was used to perform biopsy of a mass in the left breast at 1 o'clock using a lateral approach. At the conclusion of the procedure coil shaped tissue marker clip was deployed into the biopsy cavity. Follow up 2 view mammogram was performed and dictated separately. Left axilla Using sterile technique and 1% Lidocaine as local anesthetic, under direct ultrasound visualization, a 14 gauge spring-loaded device was used to perform biopsy of a left axillary lymph node using a lateral approach. At the conclusion of the procedure a HydroMARK shaped tissue marker clip was deployed into the biopsy cavity. Follow up 2 view mammogram was performed and dictated separately. IMPRESSION: Ultrasound guided biopsy of a mass in the left breast at 1 o'clock and of a left axillary lymph node. No apparent complications. Electronically Signed   By: Audie Pinto M.D.   On: 06/23/2022 12:06  MM CLIP PLACEMENT LEFT  Result Date: 06/23/2022 CLINICAL DATA:  Post procedure mammogram for clip placement EXAM: 3D DIAGNOSTIC LEFT MAMMOGRAM POST ULTRASOUND BIOPSY COMPARISON:  Previous exam(s). FINDINGS: 3D Mammographic images were obtained following ultrasound guided biopsy of a mass in the left breast at 1 o'clock. The biopsy marking clip is in expected position at the site of biopsy. 3D Mammographic images were obtained following ultrasound guided biopsy of a left axillary lymph node. The biopsy marking clip is in expected position at the site of biopsy. IMPRESSION: Appropriate positioning of the coil shaped biopsy marking clip at the site of biopsy in the left breast at 1 o'clock. Appropriate positioning of the [facility] shaped biopsy marking clip at the site of biopsy in the left axilla. Final Assessment: Post Procedure Mammograms for Marker  Placement Electronically Signed   By: Audie Pinto M.D.   On: 06/23/2022 12:04  MM DIAG BREAST TOMO UNI LEFT  Addendum Date: 06/21/2022   ADDENDUM REPORT: 06/21/2022 12:50 ADDENDUM: COMPARISON should read: Previous exams. Electronically Signed   By: Margarette Canada M.D.   On: 06/21/2022 12:50   Result Date: 06/21/2022 CLINICAL DATA:  42 year old female with new palpable LEFT breast lump identified on self-examination. EXAM: DIGITAL DIAGNOSTIC UNILATERAL LEFT MAMMOGRAM WITH TOMOSYNTHESIS; ULTRASOUND LEFT BREAST LIMITED TECHNIQUE: Left digital diagnostic mammography and breast tomosynthesis was performed.; Targeted ultrasound examination of the left breast was performed. COMPARISON:  None available. ACR Breast Density Category c: The breast tissue is heterogeneously dense, which may obscure small masses. FINDINGS: Full field, spot compression and magnification views of the LEFT breast demonstrate a new partially circumscribed partially irregular mass containing pleomorphic calcifications within the UPPER-OUTER LEFT breast. Targeted ultrasound is performed, showing a 2.7 x 1.9 x 1.9 cm irregular hypoechoic mass at the 1 o'clock position of the LEFT breast 10 cm from the nipple corresponding to the patient's palpable abnormality. Two axillary lymph nodes with cortical thickening noted. IMPRESSION: 1. Suspicious 2.7 cm UPPER-OUTER LEFT breast mass and 2 abnormal LEFT axillary lymph nodes with cortical thickening. Tissue sampling of the LEFT breast mass and 1 of the abnormal LEFT axillary lymph nodes recommended. RECOMMENDATION: Ultrasound-guided biopsies of the LEFT breast mass and a LEFT axillary lymph node, which will be scheduled. I have discussed the findings and recommendations with the patient. If applicable, a reminder letter will be sent to the patient regarding the next appointment. BI-RADS CATEGORY  4: Suspicious. Electronically Signed: By: Margarette Canada M.D. On: 06/21/2022 09:18   US BREAST LTD UNI LEFT  INC AXILLA  Addendum Date: 06/21/2022   ADDENDUM REPORT: 06/21/2022 12:50 ADDENDUM: COMPARISON should read: Previous exams. Electronically Signed   By: Margarette Canada M.D.   On: 06/21/2022 12:50   Result Date: 06/21/2022 CLINICAL DATA:  42 year old female with new palpable LEFT breast lump identified on self-examination. EXAM: DIGITAL DIAGNOSTIC UNILATERAL LEFT MAMMOGRAM WITH TOMOSYNTHESIS; ULTRASOUND LEFT BREAST LIMITED TECHNIQUE: Left digital diagnostic mammography and breast tomosynthesis was performed.; Targeted ultrasound examination of the left breast was performed. COMPARISON:  None available. ACR Breast Density Category c: The breast tissue is heterogeneously dense, which may obscure small masses. FINDINGS: Full field, spot compression and magnification views of the LEFT breast demonstrate a new partially circumscribed partially irregular mass containing pleomorphic calcifications within the UPPER-OUTER LEFT breast. Targeted ultrasound is performed, showing a 2.7 x 1.9 x 1.9 cm irregular hypoechoic mass at the 1 o'clock position of the LEFT breast 10 cm from the nipple corresponding to the patient's palpable abnormality. Two axillary lymph nodes with cortical thickening noted. IMPRESSION: 1. Suspicious 2.7 cm UPPER-OUTER LEFT breast mass and 2 abnormal LEFT axillary lymph nodes with cortical thickening. Tissue sampling of the LEFT breast mass and 1 of the abnormal LEFT axillary lymph nodes recommended. RECOMMENDATION: Ultrasound-guided biopsies of the LEFT breast mass and a LEFT axillary lymph node, which will be scheduled. I have discussed the findings and recommendations with the patient. If applicable, a reminder letter will be sent to the patient regarding the next appointment. BI-RADS CATEGORY  4: Suspicious. Electronically Signed: By: Margarette Canada M.D. On: 06/21/2022 09:18   All questions were answered. The patient knows to call the clinic with any problems, questions or concerns. I spent 60 minutes  in the care of this patient including H and P, review of records, counseling and coordination of care.     Benay Pike, MD 06/29/2022 2:35 PM

## 2022-06-30 ENCOUNTER — Encounter: Payer: Self-pay | Admitting: General Practice

## 2022-06-30 ENCOUNTER — Telehealth: Payer: Self-pay | Admitting: *Deleted

## 2022-06-30 ENCOUNTER — Other Ambulatory Visit: Payer: Self-pay | Admitting: Hematology and Oncology

## 2022-06-30 ENCOUNTER — Encounter: Payer: Self-pay | Admitting: Plastic Surgery

## 2022-06-30 ENCOUNTER — Ambulatory Visit (INDEPENDENT_AMBULATORY_CARE_PROVIDER_SITE_OTHER): Payer: Managed Care, Other (non HMO) | Admitting: Plastic Surgery

## 2022-06-30 ENCOUNTER — Encounter: Payer: Self-pay | Admitting: *Deleted

## 2022-06-30 VITALS — BP 118/78 | HR 89 | Ht 64.0 in | Wt 165.4 lb

## 2022-06-30 DIAGNOSIS — C50412 Malignant neoplasm of upper-outer quadrant of left female breast: Secondary | ICD-10-CM

## 2022-06-30 NOTE — Progress Notes (Signed)
Patient ID: Nicole Maddox, female    DOB: 05-Sep-1980, 42 y.o.   MRN: [id number]   Chief Complaint  Patient presents with   Breast Cancer    The patient is a 42 year old female here for consultation for breast reconstruction.  She is seeing Dr. Marlou Starks.  She recently felt a mass in the upper outer quadrant of the left breast.  She underwent a mammogram and ultrasound which found a 2.7 cm mass in the upper outer quadrant of the left breast with abnormal looking lymph nodes as well.  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  We are waiting for the tumor markers.  She is 5 feet 4 inches tall and weighs 168 pounds.  Her body mass index is 28.8 kg/m.  She is trying to decide between left lumpectomy, left mastectomy and bilateral mastectomies.  She will likely have 5 months of chemotherapy so she does have some time to decide.  The patient is otherwise healthy but does have some fibroids.  She has been told she is triple negative.    Review of Systems  Constitutional: Negative.   HENT: Negative.    Eyes: Negative.   Respiratory: Negative.    Cardiovascular: Negative.   Gastrointestinal: Negative.   Endocrine: Negative.   Genitourinary: Negative.   Musculoskeletal: Negative.     Past Medical History:  Diagnosis Date   Breast cancer ([place]) 06/23/2022   Family history of stomach cancer    Hyperthyroidism    Thyroid disease     Past Surgical History:  Procedure Laterality Date   BREAST BIOPSY Left 06/23/2022   [place] LT BREAST BX W LOC DEV 1ST LESION IMG BX SPEC US GUIDE 06/23/2022 [facility] MAMMOGRAPHY   WISDOM TOOTH EXTRACTION        Current Outpatient Medications:    FERROCITE 324 MG TABS tablet, Take 1 tablet by mouth daily., Disp: , Rfl:    folic acid (FOLVITE) 1 MG tablet, Take 1 mg by mouth daily., Disp: , Rfl:    ketoconazole (NIZORAL) 2 % shampoo, Apply topically., Disp: , Rfl:    methimazole  (TAPAZOLE) 5 MG tablet, Take 15 mg by mouth every morning., Disp: , Rfl:    Multiple Vitamin (MULTIVITAMIN PO), Take by mouth., Disp: , Rfl:    omeprazole (PRILOSEC) 40 MG capsule, Take 40 mg by mouth daily., Disp: , Rfl:    PREBIOTIC PRODUCT PO, Take by mouth., Disp: , Rfl:    Relugolix-Estradiol-Norethind (MYFEMBREE) 40-1-0.5 MG TABS, Take 1 tablet by mouth daily. (Patient not taking: Reported on 06/28/2022), Disp: 28 tablet, Rfl: 3   Objective:   There were no vitals filed for this visit.  Physical Exam Vitals and nursing note reviewed.  Constitutional:      Appearance: Normal appearance.  HENT:     Head: Normocephalic and atraumatic.  Cardiovascular:     Rate and Rhythm: Normal rate.     Pulses: Normal pulses.  Pulmonary:     Effort: Pulmonary effort is normal.  Abdominal:     Palpations: Abdomen is soft.  Skin:    Capillary Refill: Capillary refill takes less than 2 seconds.     Coloration: Skin is not jaundiced.     Findings: No bruising.  Neurological:     Mental Status: She is alert and oriented to person, place, and time.  Psychiatric:        Mood and Affect:  Mood normal.        Behavior: Behavior normal.        Thought Content: Thought content normal.        Judgment: Judgment normal.     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  There are two general categories of reconstruction.  We can reconstruction a breast with implants or use the patient's own tissue.  These were further discussed as listed.  Breast reconstruction is an optional procedure and eligibility depends on the full spectrum of the health of the patient and any co-morbidities.  More than one surgery is often needed to complete the reconstruction process.  The process can take three to twelve months to complete.  The breasts will not be identical due  to many factors such as rib differences, shoulder asymmetry and treatments such as radiation.  The goal is to get the breasts to look normal and symmetrical in clothes.  Scars are a part of surgery and may fade some in time but will always be present under clothes.  Surgery may be an option on the non-cancer breast to achieve more symmetry.  No matter which procedure is chosen there is always the risk of complications and even failure of the body to heal.  This could result in no breast.    The options for reconstruction include:  1. Placement of a tissue expander with Acellular dermal matrix. When the expander is the desired size surgery is performed to remove the expander and place an implant.  In some cases the implant can be placed without an expander.  2. Autologous reconstruction can include using a muscle or tissue from another area of the body to create a breast.  3. Combined procedures (ie. latissismus dorsi flap) can be done with an expander / implant placed under the muscle.   The risks, benefits, scars and recovery time were discussed for each of the above. Risks include bleeding, infection, hematoma, seroma, scarring, pain, wound healing complications, flap loss, fat necrosis, capsular contracture, need for implant removal, donor site complications, bulge, hernia, umbilical necrosis, need for urgent reoperation, and need for dressing changes.   The procedure the patient selected / that was best for the patient, was then discussed in further detail.  Total time: 45 minutes. This includes time spent with the patient during the visit as well as time spent before and after the visit reviewing the chart, documenting the encounter, making phone calls and reviewing studies.   She is still trying to decide on her cancer treatment.  She will decide between a left lumpectomy, left mastectomy or bilateral mastectomies.  She will have a virtual visit with [place] in March and let us know more of her  thoughts.  Pictures were obtained of the patient and placed in the chart with the patient's or guardian's permission.   [place], DO

## 2022-06-30 NOTE — Telephone Encounter (Signed)
Request PDL1 testing to be performed on bx

## 2022-06-30 NOTE — Progress Notes (Signed)
[facility]ual Care Note  Referred by nursing for additional layer of emotional support. Hyacinth Meeker by phone to introduce Spiritual Care and [facility] care team/programming. 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 She made notes about online resources like [employer] and [employer], and hopes to participate in [employer].   Will place an Pharmacist, hospital referral and add her to the [employer], both per her request. Plan to follow up in infusion, and she has direct Spiritual Care number as well.   [place], [place], [facility] Pager [phone] Voicemail [phone]

## 2022-06-30 NOTE — Telephone Encounter (Signed)
Spoke to pt regarding navigation resources and contact information. Denies questions or concerns regarding dx or treatment care plan. Encourage pt to call with needs. Received verbal understanding.

## 2022-07-04 ENCOUNTER — Ambulatory Visit: Payer: Self-pay | Admitting: General Surgery

## 2022-07-04 ENCOUNTER — Inpatient Hospital Stay: Payer: Managed Care, Other (non HMO) | Admitting: Licensed Clinical Social Worker

## 2022-07-04 ENCOUNTER — Encounter: Payer: Self-pay | Admitting: *Deleted

## 2022-07-04 DIAGNOSIS — C50412 Malignant neoplasm of upper-outer quadrant of left female breast: Secondary | ICD-10-CM

## 2022-07-04 NOTE — Progress Notes (Signed)
[facility] Clinical Social Work  Initial Assessment   Nicole Maddox is a 42 y.o. year old female contacted by phone. Clinical Social Work was referred by new patient protocol for assessment of psychosocial needs.   SDOH (Social Determinants of Health) assessments performed: Yes SDOH Interventions    Flowsheet Row Clinical Support from 07/04/2022 in [place] at [facility]  SDOH Interventions   Financial Strain Interventions Intervention Not Indicated       SDOH Screenings   Food Insecurity: No Food Insecurity (06/28/2022)  Housing: Low Risk  (06/28/2022)  Transportation Needs: No Transportation Needs (06/28/2022)  Utilities: Not At Risk (06/28/2022)  Depression (PHQ2-9): Low Risk  (06/28/2022)  Financial Resource Strain: Low Risk  (07/04/2022)  Tobacco Use: Low Risk  (06/30/2022)     Distress Screen completed: No     No data to display            Family/Social Information:  Housing Arrangement: patient lives alone. Enjoys having her own space Family members/support persons in your life? Family (mostly in [place]) and Friends in [place]. 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  Income source: Employment. Will have short-term disability from both jobs Financial concerns: No Type of concern: None Food access concerns: no Religious or spiritual practice: Not known Services Currently in place:  [employer]; should have short-term disability from both jobs once starting treatment  Coping/ Adjustment to diagnosis: Patient understands treatment plan and what happens next? yes, she is still processing but understands next step of chemo Concerns about diagnosis and/or treatment: general adjustment to treatment Patient reported stressors: Adjusting to my illness Patient enjoys reading and watching TV Current coping skills/ strengths:  Ability for insight , Capable of independent living , Communication skills , Motivation for treatment/growth , Special hobby/interest , Supportive family/friends , and Work skills     SUMMARY: Current SDOH Barriers:  No significant SDOH barriers noted today  Clinical Social Work Clinical Goal(s):  No clinical social work goals at this time  Interventions: Discussed common feeling and emotions when being diagnosed with cancer, and the importance of support during treatment Informed patient of the support team roles and support services at [facility] Provided [name] contact information and encouraged patient to call with any questions or concerns   Follow Up Plan: Patient will contact CSW with any support or resource needs Patient verbalizes understanding of plan: Yes    Sahana Boyland E Shamyia Grandpre, LCSW

## 2022-07-05 ENCOUNTER — Ambulatory Visit (HOSPITAL_COMMUNITY)
Admission: RE | Admit: 2022-07-05 | Discharge: 2022-07-05 | Disposition: A | Discharge status: Home / Self Care | Payer: Managed Care, Other (non HMO) | Source: Ambulatory Visit | Attending: Hematology and Oncology | Admitting: Hematology and Oncology

## 2022-07-05 DIAGNOSIS — Z01818 Encounter for other preprocedural examination: Secondary | ICD-10-CM | POA: Insufficient documentation

## 2022-07-05 DIAGNOSIS — Z0189 Encounter for other specified special examinations: Secondary | ICD-10-CM | POA: Diagnosis not present

## 2022-07-05 DIAGNOSIS — C50412 Malignant neoplasm of upper-outer quadrant of left female breast: Secondary | ICD-10-CM | POA: Diagnosis not present

## 2022-07-05 LAB — ECHOCARDIOGRAM COMPLETE
Area-P 1/2: 4.49 cm2
S' Lateral: 2.9 cm

## 2022-07-06 ENCOUNTER — Encounter: Payer: Self-pay | Admitting: *Deleted

## 2022-07-07 ENCOUNTER — Telehealth: Payer: Self-pay | Admitting: Genetic Counselor

## 2022-07-07 ENCOUNTER — Encounter: Payer: Self-pay | Admitting: Genetic Counselor

## 2022-07-07 DIAGNOSIS — Z1379 Encounter for other screening for genetic and chromosomal anomalies: Secondary | ICD-10-CM | POA: Insufficient documentation

## 2022-07-07 NOTE — Telephone Encounter (Signed)
Negative genetic testing on the 9 gene STAT panel through [facility].  Discussed that the remainder of testing is pending and we will call with those results.

## 2022-07-10 ENCOUNTER — Ambulatory Visit
Admission: RE | Admit: 2022-07-10 | Discharge: 2022-07-10 | Disposition: A | Discharge status: Home / Self Care | Payer: Managed Care, Other (non HMO) | Source: Ambulatory Visit | Attending: Hematology and Oncology | Admitting: Hematology and Oncology

## 2022-07-10 DIAGNOSIS — C50412 Malignant neoplasm of upper-outer quadrant of left female breast: Secondary | ICD-10-CM

## 2022-07-10 MED ORDER — GADOPICLENOL 0.5 MMOL/ML IV SOLN
8.0000 mL | Freq: Once | INTRAVENOUS | Status: AC | PRN
  Administered 2022-07-10: 8 mL via INTRAVENOUS

## 2022-07-11 ENCOUNTER — Other Ambulatory Visit: Payer: Self-pay | Admitting: Hematology and Oncology

## 2022-07-11 DIAGNOSIS — C50412 Malignant neoplasm of upper-outer quadrant of left female breast: Secondary | ICD-10-CM

## 2022-07-11 MED ORDER — ONDANSETRON HCL 8 MG PO TABS: ORAL_TABLET | ORAL | 1 refills | Status: DC

## 2022-07-11 MED ORDER — DEXAMETHASONE 4 MG PO TABS: ORAL_TABLET | ORAL | 1 refills | Status: DC

## 2022-07-11 MED ORDER — LIDOCAINE-PRILOCAINE 2.5-2.5 % EX CREA: TOPICAL_CREAM | CUTANEOUS | 3 refills | Status: DC

## 2022-07-11 MED ORDER — PROCHLORPERAZINE MALEATE 10 MG PO TABS: 10.0000 mg | ORAL_TABLET | Freq: Four times a day (QID) | ORAL | 1 refills | Status: DC | PRN

## 2022-07-11 NOTE — Progress Notes (Signed)
START ON PATHWAY REGIMEN - Breast     Cycles 1 through 4: A cycle is every 14 days:     Doxorubicin      Cyclophosphamide      Pegfilgrastim-xxxx    Cycles 5 through 16: A cycle is every 7 days:     Paclitaxel   **Always confirm dose/schedule in your pharmacy ordering system**  Patient Characteristics: Preoperative or Nonsurgical Candidate (Clinical Staging), Neoadjuvant Therapy followed by Surgery, Invasive Disease, Chemotherapy, HER2 Negative, ER Positive Therapeutic Status: Preoperative or Nonsurgical Candidate (Clinical Staging) AJCC M Category: cM0 AJCC Grade: G3 Breast Surgical Plan: Neoadjuvant Therapy followed by Surgery ER Status: Positive (+) AJCC 8 Stage Grouping: IIB HER2 Status: Negative (-) AJCC T Category: cT2 AJCC N Category: cN0 PR Status: Negative (-) Intent of Therapy: Curative Intent, Discussed with Patient

## 2022-07-12 ENCOUNTER — Other Ambulatory Visit: Payer: Managed Care, Other (non HMO)

## 2022-07-12 ENCOUNTER — Ambulatory Visit: Payer: Self-pay | Admitting: Genetic Counselor

## 2022-07-12 ENCOUNTER — Telehealth: Payer: Self-pay | Admitting: Hematology and Oncology

## 2022-07-12 ENCOUNTER — Encounter: Payer: Self-pay | Admitting: *Deleted

## 2022-07-12 ENCOUNTER — Telehealth: Payer: Self-pay | Admitting: Genetic Counselor

## 2022-07-12 ENCOUNTER — Encounter (HOSPITAL_BASED_OUTPATIENT_CLINIC_OR_DEPARTMENT_OTHER): Payer: Self-pay | Admitting: General Surgery

## 2022-07-12 ENCOUNTER — Other Ambulatory Visit: Payer: Self-pay | Admitting: *Deleted

## 2022-07-12 ENCOUNTER — Other Ambulatory Visit: Payer: Self-pay

## 2022-07-12 ENCOUNTER — Telehealth: Payer: Self-pay | Admitting: *Deleted

## 2022-07-12 DIAGNOSIS — C50412 Malignant neoplasm of upper-outer quadrant of left female breast: Secondary | ICD-10-CM

## 2022-07-12 DIAGNOSIS — R928 Other abnormal and inconclusive findings on diagnostic imaging of breast: Secondary | ICD-10-CM

## 2022-07-12 DIAGNOSIS — Z1379 Encounter for other screening for genetic and chromosomal anomalies: Secondary | ICD-10-CM

## 2022-07-12 NOTE — Telephone Encounter (Signed)
Spoke with patient confirming all upcoming appointments  

## 2022-07-12 NOTE — Progress Notes (Signed)
HPI:  Ms. Magistro was previously seen in the [facility] due to a personal and family history of cancer and concerns regarding a hereditary predisposition to cancer. Please refer to our prior cancer genetics clinic note for more information regarding our discussion, assessment and recommendations, at the time. Ms. Ergle recent genetic test results were disclosed to her, as were recommendations warranted by these results. These results and recommendations are discussed in more detail below.  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  The report date is July 05, 2022. Negative genetic testing on the Multi-cancer panel.  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  BLM c.3136G>A (p.Gly1046Ser), EGFR c.61G>A (p.Ala21Thr) and PDGFRA c.50G>T (p.Gly17Val) VUS identified.  The report date is July 11, 2022.  The STAT Breast cancer panel offered by [facility] includes sequencing and rearrangement analysis for the following 9 genes:  ATM, BRCA1, BRCA2, CDH1, CHEK2, PALB2, PTEN, STK11 and TP53.   The Multi-Cancer + RNA Panel offered by [facility] includes sequencing and/or deletion/duplication analysis of the following 70 genes:  AIP*, ALK, APC*, ATM*, AXIN2*, BAP1*, BARD1*, BLM*, BMPR1A*, BRCA1*, BRCA2*, BRIP1*, CDC73*, CDH1*, CDK4, CDKN1B*, CDKN2A, CHEK2*, CTNNA1*, DICER1*, EPCAM (del/dup only), EGFR, FH*, FLCN*, GREM1 (promoter  dup only), HOXB13, KIT, LZTR1, MAX*, MBD4, MEN1*, MET, MITF, MLH1*, MSH2*, MSH3*, MSH6*, MUTYH*, NF1*, NF2*, NTHL1*, PALB2*, PDGFRA, PMS2*, POLD1*, POLE*, POT1*, PRKAR1A*, PTCH1*, PTEN*, RAD51C*, RAD51D*, RB1*, RET, SDHA* (sequencing only), SDHAF2*, SDHB*, SDHC*, SDHD*, SMAD4*, SMARCA4*, SMARCB1*, SMARCE1*, STK11*, SUFU*, TMEM127*, TP53*, TSC1*, TSC2*, VHL*. RNA analysis is performed for * genes.    07/20/2022 -  Chemotherapy   Patient is on Treatment Plan : BREAST ADJUVANT DOSE DENSE AC q14d / PACLitaxel q7d       FAMILY HISTORY:  We obtained a detailed, 4-generation family history.  Significant diagnoses are listed below: Family History  Problem Relation Age of Onset   Heart attack Father 46   Stomach cancer Maternal Aunt        dx > 50   Throat cancer Maternal Uncle        dx 74s   Stomach cancer Maternal Uncle        dx > 50   Heart Problems Maternal Grandmother    Breast cancer Cousin        mother's maternal first cousin         The patient does not have children.  She has a maternal half sister and two paternal half sisters and brother.  Her maternal half sister died at age 54.  Her father is deceased and her mother is living.   The patient's mother has four brothers and two sisters.  One brother had a head/neck cancer, one brother and one sister both had stomach cancer with unknown histology.  One brother has a daughter with an unknown cancer in her 44's.  The maternal grandparents did not have cancer.   The patient's father had a heart attack and  motor vehicle accident at age 33.  He has multiple siblings but no information is known at this time.  The patient will get back to me in the next couple weeks.   Ms. Wandler is unaware of previous family history of genetic testing for hereditary cancer risks. Patient's maternal ancestors are of African American descent, and paternal ancestors are of African American descent. There is no reported Ashkenazi Jewish ancestry. There is no  known consanguinity.  GENETIC TEST RESULTS: Genetic testing reported out on July 11, 2022 through the Multi-cancer + RNA panel found no pathogenic mutations. The Multi-Cancer + RNA Panel offered by [facility] includes sequencing and/or deletion/duplication analysis of the following 70 genes:  AIP*, ALK, APC*, ATM*, AXIN2*, BAP1*, BARD1*, BLM*, BMPR1A*, BRCA1*, BRCA2*, BRIP1*, CDC73*, CDH1*, CDK4, CDKN1B*, CDKN2A, CHEK2*, CTNNA1*, DICER1*, EPCAM (del/dup only), EGFR, FH*, FLCN*, GREM1 (promoter dup only), HOXB13, KIT, LZTR1, MAX*, MBD4, MEN1*, MET, MITF, MLH1*, MSH2*, MSH3*, MSH6*, MUTYH*, NF1*, NF2*, NTHL1*, PALB2*, PDGFRA, PMS2*, POLD1*, POLE*, POT1*, PRKAR1A*, PTCH1*, PTEN*, RAD51C*, RAD51D*, RB1*, RET, SDHA* (sequencing only), SDHAF2*, SDHB*, SDHC*, SDHD*, SMAD4*, SMARCA4*, SMARCB1*, SMARCE1*, STK11*, SUFU*, TMEM127*, TP53*, TSC1*, TSC2*, VHL*. RNA analysis is performed for * genes. The test report has been scanned into EPIC and is located under the Molecular Pathology section of the Results Review tab.  A portion of the result report is included below for reference.     We discussed with Ms. Rosenow that because current genetic testing is not perfect, it is possible there may be a gene mutation in one of these genes that current testing cannot detect, but that chance is small.  We also discussed, that there could be another gene that has not yet been discovered, or that we have not yet tested, that is responsible for the cancer diagnoses in the family. It is also possible there is a hereditary cause for the cancer in the family that Ms. Bennetts did not inherit and therefore was not identified in her testing.  Therefore, it is important to remain in touch with cancer genetics in the future so that we can continue to offer Ms. Sahota the most up to date genetic testing.   Genetic testing did identify three Variants of uncertain significance (VUS) - one in the BLM gene called c.3136G>A (p.Gly1046Ser), a second in  the EGFR gene called c.61G>A (p.Ala21Thr), and a third in the PDGFRA gene called c.50G>T (p.Gly17Val).  At this time, it is unknown if these variants are associated with increased cancer risk or if they are normal findings, but most variants such as these get reclassified to being inconsequential. They should not be used to make medical management decisions. With time, we suspect the lab will determine the significance of these variants, if any. If we do learn more about them, we will try to contact Ms. Knisely to discuss it further. However, it is important to stay in touch with [place] periodically and keep the address and phone number up to date.  ADDITIONAL GENETIC TESTING: We discussed with Ms. Parrillo that her genetic testing was fairly extensive.  If there are genes identified to increase cancer risk that can be analyzed in the future, we would be happy to discuss and coordinate this testing at that time.    CANCER SCREENING RECOMMENDATIONS: Ms. Montella test result is considered negative (normal).  This means that we have not identified a hereditary cause for her personal and family history of cancer at this time. Most cancers happen by chance and this negative test suggests  that her cancer may fall into this category.    While reassuring, this does not definitively rule out a hereditary predisposition to cancer. It is still possible that there could be genetic mutations that are undetectable by current technology. There could be genetic mutations in genes that have not been tested or identified to increase cancer risk.  Therefore, it is recommended she continue to follow the cancer management and screening guidelines provided by her oncology and primary healthcare provider.   An individual's cancer risk and medical management are not determined by genetic test results alone. Overall cancer risk assessment incorporates additional factors, including personal medical history, family history, and any available  genetic information that may result in a personalized plan for cancer prevention and surveillance   RECOMMENDATIONS FOR FAMILY MEMBERS:  Individuals in this family might be at some increased risk of developing cancer, over the general population risk, simply due to the family history of cancer.  We recommended women in this family have a yearly mammogram beginning at age 56, or 88 years younger than the earliest onset of cancer, an annual clinical breast exam, and perform monthly breast self-exams. Women in this family should also have a gynecological exam as recommended by their primary provider. All family members should be referred for colonoscopy starting at age 54.  FOLLOW-UP: Lastly, we discussed with Ms. Snedeker that cancer genetics is a rapidly advancing field and it is possible that new genetic tests will be appropriate for her and/or her family members in the future. We encouraged her to remain in contact with cancer genetics on an annual basis so we can update her personal and family histories and let her know of advances in cancer genetics that may benefit this family.   Our contact number was provided. Ms. Quintela questions were answered to her satisfaction, and she knows she is welcome to call us at anytime with additional questions or concerns.   Roma Kayser, [place], [facility] Licensed, Certified Genetic Counselor [email or web address]

## 2022-07-12 NOTE — Telephone Encounter (Signed)
Revealed negative genetic testing.  Discussed that we do not know why she has breast cancer or why there is cancer in the family. It could be due to a different gene that we are not testing, or maybe our current technology may not be able to pick something up.  It will be important for her to keep in contact with genetics to keep up with whether additional testing may be needed.   Three VUS identified on the Multi-cancer panel. This will not change medical management.

## 2022-07-12 NOTE — Telephone Encounter (Signed)
Spoke with patient to discuss her MRI results and the needs for add bx's on the right and left. Informed her that GI will be calling her to schedule these apts.I have also sent a scheduling message for chemo education.

## 2022-07-13 ENCOUNTER — Other Ambulatory Visit: Payer: Self-pay | Admitting: Hematology and Oncology

## 2022-07-13 ENCOUNTER — Inpatient Hospital Stay: Payer: Managed Care, Other (non HMO) | Attending: Hematology and Oncology

## 2022-07-13 ENCOUNTER — Inpatient Hospital Stay: Payer: Managed Care, Other (non HMO) | Admitting: Pharmacist

## 2022-07-13 DIAGNOSIS — Z5189 Encounter for other specified aftercare: Secondary | ICD-10-CM | POA: Insufficient documentation

## 2022-07-13 DIAGNOSIS — C50412 Malignant neoplasm of upper-outer quadrant of left female breast: Secondary | ICD-10-CM | POA: Diagnosis present

## 2022-07-13 DIAGNOSIS — Z5111 Encounter for antineoplastic chemotherapy: Secondary | ICD-10-CM | POA: Insufficient documentation

## 2022-07-13 DIAGNOSIS — Z171 Estrogen receptor negative status [ER-]: Secondary | ICD-10-CM | POA: Insufficient documentation

## 2022-07-13 DIAGNOSIS — R928 Other abnormal and inconclusive findings on diagnostic imaging of breast: Secondary | ICD-10-CM

## 2022-07-13 DIAGNOSIS — Z79899 Other long term (current) drug therapy: Secondary | ICD-10-CM | POA: Insufficient documentation

## 2022-07-13 NOTE — Progress Notes (Signed)
[place]       Telephone: [phone]?Fax: [phone]   Oncology Clinical Pharmacist Practitioner Initial Assessment  Nicole Maddox is a 42 y.o. female with a diagnosis of breast cancer. They were contacted today via in-person visit.  Indication/Regimen Doxorubicin (Adriamycin) and cyclophosphamide (Cytoxan) followed by paclitaxel (Taxol) are being used appropriately for treatment of breast cancer by Dr. Benay Pike.      Wt Readings from Last 1 Encounters:  06/30/22 165 lb 6.4 oz (75 kg)    Estimated body surface area is 1.84 meters squared as calculated from the following:   Height as of 07/12/22: '5\' 4"'$  (1.626 m).   Weight as of 07/12/22: 165 lb 5.5 oz (75 kg).  The dosing regimen is every 14 days for 4 cycles  Doxorubicin (60 mg/m2) on Day 1 Cyclophosphamide (600 mg/m2) on Day 1 Pegfilgrastim (6 mg) on Day 3  Followed by a dosing regimen that is every 7 days for 12 cycles  Paclitaxel (80 mg/m2) on Days 1, 8, 15, 22  It is planned to continue until treatment plan completion or unacceptable toxicity. The tentative start date is: 07/20/22  Dose Modifications None currently   Allergies No Known Allergies  Vitals    07/12/2022   10:08 AM 06/30/2022    1:20 PM 06/29/2022   12:52 PM  Oncology Vitals  Height 163 cm 163 cm   Weight 75 kg 75.025 kg 75.342 kg  Weight (lbs) 165 lbs 6 oz 165 lbs 6 oz 166 lbs 2 oz  BMI 28.38 kg/m2   28.38 kg/m2 28.39 kg/m2   28.39 kg/m2 28.51 kg/m2   28.51 kg/m2  Temp   97.7 F (36.5 C)  Pulse Rate  89 103  BP  118/78 126/76  Resp   17  SpO2  100 % 100 %  BSA (m2) 1.84 m2   1.84 m2 1.84 m2   1.84 m2 1.84 m2   1.84 m2   No labs today for education visit.  Contraindications Contraindications were reviewed? Yes Contraindications to therapy were identified? No   Safety Precautions The following safety precautions were reviewed:  Fever: reviewed the importance of having a thermometer and the Centers for  Disease Control and Prevention (CDC) definition of fever which is 100.48F (38C) or higher. Patient should call 24/7 triage at [phone] if experiencing a fever or any other symptoms Decreased white blood cells (WBCs) and increased risk for infection Decreased platelet count and increased risk of bleeding Decreased hemoglobin, part of the red blood cells that carry iron and oxygen Change in the color of urine Fatigue Hair loss Nausea or vomiting Mouth irritation or sores Doxorubicin (vesicant) Cardiotoxicity Hemorrhagic cystitis Secondary cancers Muscle or joint pain or weakness Peripheral neuropathy Hypersensitivity reactions Avoid grapefruit products Intimacy, sexual activity, contraception, fertility Handling body fluids and waste We did recommend holding a thyroid supplement she was taking because one of the ingredients could possibly interact with cyclophosphamide.   Medication Reconciliation Current Outpatient Medications  Medication Sig Dispense Refill   FERROCITE 324 MG TABS tablet Take 1 tablet by mouth daily.     folic acid (FOLVITE) 1 MG tablet Take 1 mg by mouth daily.     ketoconazole (NIZORAL) 2 % shampoo Apply topically.     methimazole (TAPAZOLE) 5 MG tablet Take 15 mg by mouth every morning.     Multiple Vitamin (MULTIVITAMIN PO) Take by mouth.     omeprazole (PRILOSEC) 40 MG capsule Take 40 mg by mouth  daily.     PREBIOTIC PRODUCT PO Take by mouth.     Ashwagandha 300 MG TABS Take by mouth. Also has selenium, zinc, iodine, L-tyrosine. Named "Thyroid Defend". (Patient not taking: Reported on 07/13/2022)     dexamethasone (DECADRON) 4 MG tablet Take 2 tablets (8 mg total) by mouth daily for 3 days. Start the day after doxorubicin/cyclophosphamide chemotherapy. Take with food. (Patient not taking: Reported on 07/13/2022) 30 tablet 1   lidocaine-prilocaine (EMLA) cream Apply to affected area once (Patient not taking: Reported on 07/13/2022) 30 g 3   ondansetron (ZOFRAN)  8 MG tablet Take 1 tab (8 mg) by mouth every 8 hrs as needed for nausea/vomiting. Start third day after doxorubicin/cyclophosphamide chemotherapy. (Patient not taking: Reported on 07/13/2022) 30 tablet 1   prochlorperazine (COMPAZINE) 10 MG tablet Take 1 tablet (10 mg total) by mouth every 6 (six) hours as needed for nausea or vomiting. (Patient not taking: Reported on 07/13/2022) 30 tablet 1   No current facility-administered medications for this visit.   Medication reconciliation is based on the patient's most recent medication list in the electronic medical record (EMR) including herbal products and OTC medications.   The patient's medication list was reviewed today with the patient? Yes   Drug-drug interactions (DDIs) DDIs were evaluated? Yes Significant DDIs identified?  As above, her thyroid will be held because one of the ingredients may interact with cyclophosphamide  Drug-Food Interactions Drug-food interactions were evaluated? Yes Drug-food interactions identified? No   Follow-up Plan  Treatment start date: 07/20/22 Port placement date: 07/19/22 ECHO date: 07/05/22 Discussed chemotherapy regimen, prescriptions, and premedications. Reviewed possible side effects for all agents She will hold her OTC thyroid herbal supplement as it contains an ingredient that could potentially increase toxicity of cyclophosphamide. She verbalized understanding Clinical pharmacy will assist [name] and  Dr. Arletha Pili Iruku on an as needed basis going forward  Kandis Ban participated in the discussion, expressed understanding, and voiced agreement with the above plan. All questions were answered to her satisfaction. The patient was advised to contact the clinic at [phone] with any questions or concerns prior to her return visit.   I spent 60 minutes assessing the patient.  Raina Mina, RPH-CPP, 07/13/2022 2:55 PM  **Disclaimer: This note was dictated with voice recognition software. Similar  sounding words can inadvertently be transcribed and this note may contain transcription errors which may not have been corrected upon publication of note.**

## 2022-07-14 ENCOUNTER — Encounter: Payer: Self-pay | Admitting: *Deleted

## 2022-07-14 NOTE — Progress Notes (Signed)

## 2022-07-19 ENCOUNTER — Ambulatory Visit (HOSPITAL_BASED_OUTPATIENT_CLINIC_OR_DEPARTMENT_OTHER): Payer: Managed Care, Other (non HMO) | Admitting: Anesthesiology

## 2022-07-19 ENCOUNTER — Other Ambulatory Visit: Payer: Self-pay

## 2022-07-19 ENCOUNTER — Ambulatory Visit (HOSPITAL_COMMUNITY): Payer: Managed Care, Other (non HMO)

## 2022-07-19 ENCOUNTER — Encounter (HOSPITAL_BASED_OUTPATIENT_CLINIC_OR_DEPARTMENT_OTHER): Payer: Self-pay | Admitting: General Surgery

## 2022-07-19 ENCOUNTER — Ambulatory Visit (HOSPITAL_BASED_OUTPATIENT_CLINIC_OR_DEPARTMENT_OTHER)
Admission: RE | Admit: 2022-07-19 | Discharge: 2022-07-19 | Disposition: A | Discharge status: Home / Self Care | Payer: Managed Care, Other (non HMO) | Attending: General Surgery | Admitting: General Surgery

## 2022-07-19 ENCOUNTER — Telehealth: Payer: Self-pay | Admitting: *Deleted

## 2022-07-19 ENCOUNTER — Encounter (HOSPITAL_BASED_OUTPATIENT_CLINIC_OR_DEPARTMENT_OTHER)
Admission: RE | Disposition: A | Discharge status: Home / Self Care | Payer: Self-pay | Source: Home / Self Care | Attending: General Surgery

## 2022-07-19 DIAGNOSIS — C50912 Malignant neoplasm of unspecified site of left female breast: Secondary | ICD-10-CM | POA: Diagnosis not present

## 2022-07-19 DIAGNOSIS — E0590 Thyrotoxicosis, unspecified without thyrotoxic crisis or storm: Secondary | ICD-10-CM | POA: Insufficient documentation

## 2022-07-19 DIAGNOSIS — K219 Gastro-esophageal reflux disease without esophagitis: Secondary | ICD-10-CM | POA: Diagnosis not present

## 2022-07-19 DIAGNOSIS — Z79899 Other long term (current) drug therapy: Secondary | ICD-10-CM | POA: Diagnosis not present

## 2022-07-19 DIAGNOSIS — Z01818 Encounter for other preprocedural examination: Secondary | ICD-10-CM

## 2022-07-19 DIAGNOSIS — C50412 Malignant neoplasm of upper-outer quadrant of left female breast: Secondary | ICD-10-CM | POA: Diagnosis present

## 2022-07-19 HISTORY — PX: PORTACATH PLACEMENT: SHX2246

## 2022-07-19 HISTORY — DX: Gastro-esophageal reflux disease without esophagitis: K21.9

## 2022-07-19 LAB — POCT PREGNANCY, URINE: Preg Test, Ur: NEGATIVE

## 2022-07-19 SURGERY — INSERTION, TUNNELED CENTRAL VENOUS DEVICE, WITH PORT
Anesthesia: General | Site: Chest | Laterality: Right

## 2022-07-19 MED ORDER — LACTATED RINGERS IV SOLN: INTRAVENOUS | Status: DC

## 2022-07-19 MED ORDER — PROPOFOL 10 MG/ML IV BOLUS
INTRAVENOUS | Status: DC | PRN
  Administered 2022-07-19: 150 mg via INTRAVENOUS

## 2022-07-19 MED ORDER — CELECOXIB 200 MG PO CAPS
ORAL_CAPSULE | ORAL | Status: AC
  Filled 2022-07-19: qty 1

## 2022-07-19 MED ORDER — CEFAZOLIN SODIUM-DEXTROSE 2-4 GM/100ML-% IV SOLN
2.0000 g | INTRAVENOUS | Status: AC
  Administered 2022-07-19: 2 g via INTRAVENOUS

## 2022-07-19 MED ORDER — CELECOXIB 200 MG PO CAPS
200.0000 mg | ORAL_CAPSULE | ORAL | Status: AC
  Administered 2022-07-19: 200 mg via ORAL

## 2022-07-19 MED ORDER — OXYCODONE HCL 5 MG/5ML PO SOLN: 5.0000 mg | Freq: Once | ORAL | Status: DC | PRN

## 2022-07-19 MED ORDER — LIDOCAINE-PRILOCAINE 2.5-2.5 % EX CREA: 1.0000 | TOPICAL_CREAM | CUTANEOUS | 0 refills | Status: DC | PRN

## 2022-07-19 MED ORDER — BUPIVACAINE HCL (PF) 0.25 % IJ SOLN
INTRAMUSCULAR | Status: AC
  Filled 2022-07-19: qty 30

## 2022-07-19 MED ORDER — PROPOFOL 500 MG/50ML IV EMUL
INTRAVENOUS | Status: DC | PRN
  Administered 2022-07-19: 25 ug/kg/min via INTRAVENOUS

## 2022-07-19 MED ORDER — CEFAZOLIN SODIUM-DEXTROSE 2-4 GM/100ML-% IV SOLN
INTRAVENOUS | Status: AC
  Filled 2022-07-19: qty 100

## 2022-07-19 MED ORDER — GABAPENTIN 300 MG PO CAPS
ORAL_CAPSULE | ORAL | Status: AC
  Filled 2022-07-19: qty 1

## 2022-07-19 MED ORDER — MIDAZOLAM HCL 2 MG/2ML IJ SOLN
INTRAMUSCULAR | Status: AC
  Filled 2022-07-19: qty 2

## 2022-07-19 MED ORDER — ONDANSETRON HCL 4 MG/2ML IJ SOLN
INTRAMUSCULAR | Status: DC | PRN
  Administered 2022-07-19: 4 mg via INTRAVENOUS

## 2022-07-19 MED ORDER — CHLORHEXIDINE GLUCONATE CLOTH 2 % EX PADS: 6.0000 | MEDICATED_PAD | Freq: Once | CUTANEOUS | Status: DC

## 2022-07-19 MED ORDER — ACETAMINOPHEN 500 MG PO TABS
ORAL_TABLET | ORAL | Status: AC
  Filled 2022-07-19: qty 2

## 2022-07-19 MED ORDER — ACETAMINOPHEN 500 MG PO TABS
1000.0000 mg | ORAL_TABLET | ORAL | Status: AC
  Administered 2022-07-19: 1000 mg via ORAL

## 2022-07-19 MED ORDER — OXYCODONE HCL 5 MG PO TABS: 5.0000 mg | ORAL_TABLET | Freq: Once | ORAL | Status: DC | PRN

## 2022-07-19 MED ORDER — LIDOCAINE HCL (CARDIAC) PF 100 MG/5ML IV SOSY
PREFILLED_SYRINGE | INTRAVENOUS | Status: DC | PRN
  Administered 2022-07-19: 60 mg via INTRAVENOUS

## 2022-07-19 MED ORDER — FENTANYL CITRATE (PF) 100 MCG/2ML IJ SOLN
INTRAMUSCULAR | Status: DC | PRN
  Administered 2022-07-19: 100 ug via INTRAVENOUS

## 2022-07-19 MED ORDER — GABAPENTIN 300 MG PO CAPS
300.0000 mg | ORAL_CAPSULE | ORAL | Status: AC
  Administered 2022-07-19: 300 mg via ORAL

## 2022-07-19 MED ORDER — MIDAZOLAM HCL 5 MG/5ML IJ SOLN
INTRAMUSCULAR | Status: DC | PRN
  Administered 2022-07-19: 2 mg via INTRAVENOUS

## 2022-07-19 MED ORDER — HEPARIN (PORCINE) IN NACL 1000-0.9 UT/500ML-% IV SOLN
INTRAVENOUS | Status: AC
  Filled 2022-07-19: qty 500

## 2022-07-19 MED ORDER — FENTANYL CITRATE (PF) 100 MCG/2ML IJ SOLN: 25.0000 ug | INTRAMUSCULAR | Status: DC | PRN

## 2022-07-19 MED ORDER — BUPIVACAINE HCL (PF) 0.25 % IJ SOLN
INTRAMUSCULAR | Status: DC | PRN
  Administered 2022-07-19: 7 mL

## 2022-07-19 MED ORDER — HEPARIN SOD (PORK) LOCK FLUSH 100 UNIT/ML IV SOLN
INTRAVENOUS | Status: DC | PRN
  Administered 2022-07-19: 400 [IU] via INTRAVENOUS

## 2022-07-19 MED ORDER — HEPARIN (PORCINE) IN NACL 2-0.9 UNITS/ML
INTRAMUSCULAR | Status: AC | PRN
  Administered 2022-07-19: 1 via INTRAVENOUS

## 2022-07-19 MED ORDER — DEXAMETHASONE SODIUM PHOSPHATE 10 MG/ML IJ SOLN
INTRAMUSCULAR | Status: DC | PRN
  Administered 2022-07-19: 4 mg via INTRAVENOUS

## 2022-07-19 MED ORDER — PROMETHAZINE HCL 25 MG/ML IJ SOLN: 6.2500 mg | INTRAMUSCULAR | Status: DC | PRN

## 2022-07-19 MED ORDER — OXYCODONE HCL 5 MG PO TABS: 5.0000 mg | ORAL_TABLET | Freq: Four times a day (QID) | ORAL | 0 refills | Status: DC | PRN

## 2022-07-19 MED ORDER — FENTANYL CITRATE (PF) 100 MCG/2ML IJ SOLN
INTRAMUSCULAR | Status: AC
  Filled 2022-07-19: qty 2

## 2022-07-19 MED ORDER — HEPARIN SOD (PORK) LOCK FLUSH 100 UNIT/ML IV SOLN
INTRAVENOUS | Status: AC
  Filled 2022-07-19: qty 5

## 2022-07-19 MED FILL — Dexamethasone Sodium Phosphate Inj 100 MG/10ML: INTRAMUSCULAR | Qty: 1 | Status: AC

## 2022-07-19 MED FILL — Fosaprepitant Dimeglumine For IV Infusion 150 MG (Base Eq): INTRAVENOUS | Qty: 5 | Status: AC

## 2022-07-19 SURGICAL SUPPLY — 46 items
ADH SKN CLS APL DERMABOND .7 (GAUZE/BANDAGES/DRESSINGS) ×1
APL PRP STRL LF DISP 70% ISPRP (MISCELLANEOUS) ×1
BAG DECANTER FOR FLEXI CONT (MISCELLANEOUS) ×1 IMPLANT
BLADE SURG 15 STRL LF DISP TIS (BLADE) ×1 IMPLANT
BLADE SURG 15 STRL SS (BLADE) ×1
CANISTER SUCT 1200ML W/VALVE (MISCELLANEOUS) IMPLANT
CHLORAPREP W/TINT 26 (MISCELLANEOUS) ×1 IMPLANT
CLEANER CAUTERY TIP 5X5 PAD (MISCELLANEOUS) ×1 IMPLANT
COVER BACK TABLE 60X90IN (DRAPES) ×1 IMPLANT
COVER MAYO STAND STRL (DRAPES) ×1 IMPLANT
DERMABOND ADVANCED .7 DNX12 (GAUZE/BANDAGES/DRESSINGS) ×1 IMPLANT
DRAPE C-ARM 42X72 X-RAY (DRAPES) ×1 IMPLANT
DRAPE LAPAROSCOPIC ABDOMINAL (DRAPES) ×1 IMPLANT
DRAPE UTILITY XL STRL (DRAPES) ×1 IMPLANT
ELECT REM PT RETURN 9FT ADLT (ELECTROSURGICAL) ×1
ELECTRODE REM PT RTRN 9FT ADLT (ELECTROSURGICAL) ×1 IMPLANT
GAUZE 4X4 16PLY ~~LOC~~+RFID DBL (SPONGE) ×1 IMPLANT
GLOVE BIO SURGEON STRL SZ7.5 (GLOVE) ×1 IMPLANT
GOWN STRL REUS W/ TWL LRG LVL3 (GOWN DISPOSABLE) ×2 IMPLANT
GOWN STRL REUS W/TWL LRG LVL3 (GOWN DISPOSABLE) ×2
IV KIT MINILOC 20X1 SAFETY (NEEDLE) IMPLANT
KIT PORT POWER 8FR ISP CVUE (Port) IMPLANT
NDL HYPO 22X1.5 SAFETY MO (MISCELLANEOUS) IMPLANT
NDL HYPO 25X1 1.5 SAFETY (NEEDLE) ×1 IMPLANT
NDL SAFETY ECLIP 18X1.5 (MISCELLANEOUS) IMPLANT
NDL SPNL 22GX3.5 QUINCKE BK (NEEDLE) IMPLANT
NEEDLE HYPO 22X1.5 SAFETY MO (MISCELLANEOUS) IMPLANT
NEEDLE HYPO 25X1 1.5 SAFETY (NEEDLE) ×1 IMPLANT
NEEDLE SAFETY HYPO 22GAX1.5 (MISCELLANEOUS)
NEEDLE SPNL 22GX3.5 QUINCKE BK (NEEDLE) IMPLANT
PACK BASIN DAY SURGERY FS (CUSTOM PROCEDURE TRAY) ×1 IMPLANT
PENCIL SMOKE EVACUATOR (MISCELLANEOUS) ×1 IMPLANT
SLEEVE SCD COMPRESS KNEE MED (STOCKING) IMPLANT
SPIKE FLUID TRANSFER (MISCELLANEOUS) IMPLANT
SUT MON AB 4-0 PC3 18 (SUTURE) ×1 IMPLANT
SUT PROLENE 2 0 SH DA (SUTURE) ×1 IMPLANT
SUT SILK 2 0 TIES 17X18 (SUTURE)
SUT SILK 2-0 18XBRD TIE BLK (SUTURE) IMPLANT
SUT VIC AB 3-0 SH 27 (SUTURE) ×1
SUT VIC AB 3-0 SH 27X BRD (SUTURE) ×1 IMPLANT
SYR 10ML LL (SYRINGE) IMPLANT
SYR 5ML LL (SYRINGE) ×1 IMPLANT
SYR CONTROL 10ML LL (SYRINGE) ×2 IMPLANT
TOWEL GREEN STERILE FF (TOWEL DISPOSABLE) ×2 IMPLANT
TUBE CONNECTING 20X1/4 (TUBING) IMPLANT
YANKAUER SUCT BULB TIP NO VENT (SUCTIONS) IMPLANT

## 2022-07-19 NOTE — Anesthesia Postprocedure Evaluation (Signed)
Anesthesia Post Note  Patient: Nicole Maddox  Procedure(s) Performed: INSERTION PORT-A-CATH (Right: Chest)     Patient location during evaluation: PACU Anesthesia Type: General Level of consciousness: awake Pain management: pain level controlled Vital Signs Assessment: post-procedure vital signs reviewed and stable Respiratory status: spontaneous breathing, nonlabored ventilation and respiratory function stable Cardiovascular status: blood pressure returned to baseline and stable Postop Assessment: no apparent nausea or vomiting Anesthetic complications: no   No notable events documented.  Last Vitals:  Vitals:   07/19/22 1530 07/19/22 1545  BP: 115/71 120/79  Pulse: (!) 108 (!) 104  Resp: 17 19  Temp:    SpO2: 100% 100%    Last Pain:  Vitals:   07/19/22 1530  TempSrc:   PainSc: 0-No pain                 Nilda Simmer

## 2022-07-19 NOTE — Transfer of Care (Signed)
Immediate Anesthesia Transfer of Care Note  Patient: Nicole Maddox  Procedure(s) Performed: INSERTION PORT-A-CATH (Right: Chest)  Patient Location: PACU  Anesthesia Type:General  Level of Consciousness: drowsy and patient cooperative  Airway & Oxygen Therapy: Patient Spontanous Breathing and Patient connected to face mask oxygen  Post-op Assessment: Report given to RN and Post -op Vital signs reviewed and stable  Post vital signs: Reviewed and stable  Last Vitals:  Vitals Value Taken Time  BP 87/52 07/19/22 1515  Temp    Pulse 76 07/19/22 1515  Resp    SpO2 100 % 07/19/22 1515  Vitals shown include unvalidated device data.  Last Pain:  Vitals:   07/19/22 1319  TempSrc: Oral  PainSc: 0-No pain      Patients Stated Pain Goal: 4 ([id number])  Complications: No notable events documented.

## 2022-07-19 NOTE — Anesthesia Procedure Notes (Signed)
Procedure Name: LMA Insertion Date/Time: 07/19/2022 2:24 PM  Performed by: Jaquavion Mccannon, Ernesta Amble, CRNAPre-anesthesia Checklist: Patient identified, Emergency Drugs available, Suction available and Patient being monitored Patient Re-evaluated:Patient Re-evaluated prior to induction Oxygen Delivery Method: Circle system utilized Preoxygenation: Pre-oxygenation with 100% oxygen Induction Type: IV induction Ventilation: Mask ventilation without difficulty LMA: LMA inserted LMA Size: 4.0 Number of attempts: 1 Airway Equipment and Method: Bite block Placement Confirmation: positive ETCO2 Tube secured with: Tape Dental Injury: Teeth and Oropharynx as per pre-operative assessment

## 2022-07-19 NOTE — Telephone Encounter (Signed)
Kelise [email or web address]> To: "[employer] FMLA" [email or web address]> Good afternoon. My apologies. These forms were in my inbox and I thought that they sent over a copy. They are due by 2/16. Once again my apologies for the late notice.    Reply sent with three attachments.      Received this message today.  Will be glad to assist with forms.  Unable to meet the deadline requested therefore advising you request an extension.   Form process also requires a signed [place] and our office cover sheet (see attached).  "[place] for Use/Disclosure" form is required for any means of (PHI) Protected Health Information (Electronic, Verbal or Paper), to third parties [place], [place]).  [facility]) office "Disability / FMLA Cover sheet" provides information staff requires to correctly complete and return forms.   Both forms will be with appointment registration staff for you to complete and sign tomorrow.   Staff ask to allow up to 7 - 10 business days (14 calendar) to process form which begins date form staff receives request with authorization.   Forms are completed in order received.  Currently twenty forms in queue ahead of these [employer] requests, eleven ahead of the [employer] request.   [facility] office does not manage distribution of PHI to the third party or patient.  Completed forms requesting copies of protected health information are sent out through department mailed with the authorization to the "[place] Department" offices to complete.

## 2022-07-19 NOTE — Op Note (Signed)
07/19/2022  3:10 PM  PATIENT:  Nicole Maddox  42 y.o. female  PRE-OPERATIVE DIAGNOSIS:  LEFT BREAST CANCER  POST-OPERATIVE DIAGNOSIS:  LEFT BREAST CANCER  PROCEDURE:  Procedure(s) with comments: INSERTION PORT-A-CATH (Right Subclavian Vein)  SURGEON:  Surgeon(s) and Role:    * Jovita Kussmaul, MD - Primary  PHYSICIAN ASSISTANT:   ASSISTANTS: none   ANESTHESIA:   local and general  EBL:  minimal   BLOOD ADMINISTERED:none  DRAINS: none   LOCAL MEDICATIONS USED:  MARCAINE     SPECIMEN:  No Specimen  DISPOSITION OF SPECIMEN:  N/A  COUNTS:  YES  TOURNIQUET:  * No tourniquets in log *  DICTATION: .Dragon Dictation  After informed consent was obtained the patient was brought to the operating room and placed in the supine position on the operating table.  After adequate induction of general anesthesia a roll was placed between the patient's shoulder blades to extend the shoulder slightly.  The right chest and neck area were then prepped with ChloraPrep, allowed to dry, and draped in usual sterile manner.  An appropriate timeout was performed.  The patient was placed in Trendelenburg position.  The area lateral to the bend of the clavicle on the right chest wall was then infiltrated with quarter percent Marcaine.  A large bore needle from the Port-A-Cath kit was used to slide beneath the bend of the clavicle heading towards the sternal notch and in doing so I was able to access the right subclavian vein without difficulty.  A wire was fed through the needle using the Seldinger technique without difficulty.  The wire was confirmed in the central venous system using real-time fluoroscopy.  Next a small incision was then made at the wire entry site with a 15 blade knife.  The incision was carried through the skin and subcutaneous tissue sharply with the electrocautery.  A subcutaneous pocket was then created inferior to the incision by blunt finger dissection.  Next the tubing was placed  on the reservoir.  The reservoir was placed in the pocket and the length of the tubing was estimated using real-time fluoroscopy.  The tubing was then cut to the appropriate length.  Next a sheath and dilator were fed over the wire using the Seldinger technique without difficulty.  The dilator and wire were removed from the patient.  The tubing was fed through the sheath as far as it would go and then held in place while the sheath was gently cracked and separated.  Another real-time fluoroscopy image showed the tip of the catheter to be in the distal superior vena cava.  The tubing was then permanently anchored to the reservoir.  The reservoir was anchored in the pocket with two 2-0 Prolene stitches.  The port was then aspirated and it aspirated blood easily.  The port was then flushed initially with a dilute heparin solution and then with a more concentrated heparin solution.  The subcutaneous tissue was closed over the port with interrupted 3-0 Vicryl stitches.  The skin was closed with interrupted 4-0 Monocryl subcuticular stitches.  Dermabond dressings were applied.  The patient tolerated the procedure well.  At the end of the case all needle sponge and instrument counts were correct.  The patient was then awakened and taken to recovery in stable condition.  PLAN OF CARE: Discharge to home after PACU  PATIENT DISPOSITION:  PACU - hemodynamically stable.   Delay start of Pharmacological VTE agent (>24hrs) due to surgical blood loss or risk of  bleeding: not applicable

## 2022-07-19 NOTE — Discharge Instructions (Signed)

## 2022-07-19 NOTE — H&P (Signed)
REFERRING PHYSICIAN: [name], MD  PROVIDER: Landry Corporal, MD  MRN: [id number] DOB: December 25, 1980 Subjective  Chief Complaint: Breast Cancer   History of Present Illness: Nicole Maddox is a 42 y.o. female who is seen today as an office consultation for evaluation of Breast Cancer .  We are asked to see the patient in consultation by Dr. Dellis Filbert to evaluate her for a new left breast cancer. The patient is a 42 year old black female who recently felt a mass in the upper outer quadrant of the left breast. She contacted her doctor and was evaluated by mammogram and ultrasound. She was found to have a 2.7 cm mass in the upper outer quadrant of the left breast with 2 abnormal looking lymph nodes. The lymph nodes were biopsied and came back benign. 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 The tumor markers have not been reported yet. She is otherwise in good health and does not smoke.  Review of Systems: A complete review of systems was obtained from the patient. I have reviewed this information and discussed as appropriate with the patient. See HPI as well for other ROS.  ROS  Medical History: Past Medical History: Diagnosis Date GERD (gastroesophageal reflux disease) Thyroid disease  Patient Active Problem List Diagnosis Malignant neoplasm of upper-outer quadrant of left female breast (CMS-HCC)  Past Surgical History: Procedure Laterality Date keloid   No Known Allergies  Current Outpatient Medications on File Prior to Visit Medication Sig Dispense Refill ASHWAGANDHA EXTRACT ORAL Take by mouth etodolac (LODINE) 500 MG tablet Take 500 mg by mouth 2 (two) times daily folic acid (FOLVITE) 1 MG tablet Take 1,000 mcg by mouth once daily fructooligosaccharides (PREBIOTIC FIBER, FOS, ORAL) Take by mouth ketoconazole (NIZORAL) 2 % shampoo as directed L-TYROSINE ORAL Take 500 mg by mouth methIMAzole (TAPAZOLE) 5 MG tablet Take 10 mg by mouth  every morning MYFEMBREE 40-1-0.5 mg Tab Take 1 tablet by mouth once daily omeprazole (PRILOSEC) 40 MG DR capsule Take 40 mg by mouth once daily SELENIUM ORAL Take 300 mg by mouth sulfamethoxazole-trimethoprim (BACTRIM DS) 800-160 mg tablet TAKE 1 TABLET BY MOUTH TWICE A DAY FOR 3 DAYS zinc acetate 50 mg (zinc) Cap Take by mouth  No current facility-administered medications on file prior to visit.  Family History Problem Relation Age of Onset Diabetes Mother High blood pressure (Hypertension) Mother Stroke Other Skin cancer Other Breast cancer Other   Social History  Tobacco Use Smoking Status Never Smokeless Tobacco Never   Social History  Socioeconomic History Marital status: Single Tobacco Use Smoking status: Never Smokeless tobacco: Never  Objective:  Vitals: BP: 118/82 Pulse: 66 Weight: 76.2 kg (168 lb) Height: 162.6 cm (5' 4"$ ) PainSc: 0-No pain  Body mass index is 28.84 kg/m.  Physical Exam Vitals reviewed. Constitutional: General: She is not in acute distress. Appearance: Normal appearance. HENT: Head: Normocephalic and atraumatic. Right Ear: External ear normal. Left Ear: External ear normal. Nose: Nose normal. Mouth/Throat: Mouth: Mucous membranes are moist. Pharynx: Oropharynx is clear. Eyes: General: No scleral icterus. Extraocular Movements: Extraocular movements intact. Conjunctiva/sclera: Conjunctivae normal. Pupils: Pupils are equal, round, and reactive to light. Cardiovascular: Rate and Rhythm: Normal rate and regular rhythm. Pulses: Normal pulses. Heart sounds: Normal heart sounds. Pulmonary: Effort: Pulmonary effort is normal. No respiratory distress. Breath sounds: Normal breath sounds. Abdominal: General: Bowel sounds are normal. Palpations: Abdomen is soft. Tenderness: There is no abdominal tenderness. Musculoskeletal: General: No swelling, tenderness or deformity. Normal range of motion.  Cervical back: Normal range of  motion and neck supple. Skin: General: Skin is warm and dry. Coloration: Skin is not jaundiced. Neurological: General: No focal deficit present. Mental Status: She is alert and oriented to person, place, and time. Psychiatric: Mood and Affect: Mood normal. Behavior: Behavior normal.    Breast: There is a 3 to 4 cm palpable mobile mass in the upper outer quadrant far in the left breast. There is no palpable mass in the right breast. There is no palpable axillary, supraclavicular, or cervical lymphadenopathy.  Labs, Imaging and Diagnostic Testing:  Assessment and Plan:  Diagnoses and all orders for this visit:  Malignant neoplasm of upper-outer quadrant of left female breast, unspecified estrogen receptor status (CMS-HCC)    The patient appears to have a 2.7 cm cancer in the upper outer quadrant of the left breast with clinically negative nodes. Her tumor markers have not been reported yet. At this point I will refer her to medical and radiation oncology to discuss adjuvant versus neoadjuvant therapy. Given her age she will also need genetic testing. I will also send her to physical therapy for preoperative evaluation. I will call her once the tumor markers have been reported so we can finalize our plans. She will be thinking about lumpectomy versus mastectomy and which way she would prefer to go. If she has unfavorable markers then she may benefit from neoadjuvant chemotherapy. If she has all favorable markers then she may go to surgery first.

## 2022-07-19 NOTE — Interval H&P Note (Signed)
History and Physical Interval Note:  07/19/2022 1:50 PM  Nicole Maddox  has presented today for surgery, with the diagnosis of LEFT BREAST CANCER.  The various methods of treatment have been discussed with the patient and family. After consideration of risks, benefits and other options for treatment, the patient has consented to  Procedure(s) with comments: INSERTION PORT-A-CATH (N/A) - 60 MIN ROOM 8 as a surgical intervention.  The patient's history has been reviewed, patient examined, no change in status, stable for surgery.  I have reviewed the patient's chart and labs.  Questions were answered to the patient's satisfaction.     Autumn Messing III

## 2022-07-19 NOTE — Anesthesia Preprocedure Evaluation (Addendum)
Anesthesia Evaluation  Patient identified by MRN, date of birth, ID band Patient awake    Reviewed: Allergy & Precautions, NPO status , Patient's Chart, lab work & pertinent test results  History of Anesthesia Complications Negative for: history of anesthetic complications  Airway Mallampati: II  TM Distance: >3 FB Neck ROM: Full    Dental  (+) Dental Advisory Given, Teeth Intact Permanent retainer top and bottom:   Pulmonary neg pulmonary ROS   Pulmonary exam normal breath sounds clear to auscultation       Cardiovascular negative cardio ROS  Rhythm:Regular Rate:Normal     Neuro/Psych negative neurological ROS     GI/Hepatic Neg liver ROS,GERD  Medicated,,  Endo/Other  neg diabetes Hyperthyroidism (on methimazole)   Renal/GU negative Renal ROS     Musculoskeletal   Abdominal   Peds  Hematology negative hematology ROS (+)   Anesthesia Other Findings Left breast cancer  Reproductive/Obstetrics                             Anesthesia Physical Anesthesia Plan  ASA: 3  Anesthesia Plan: General   Post-op Pain Management:    Induction: Intravenous  PONV Risk Score and Plan: 3 and Ondansetron, Dexamethasone and Treatment may vary due to age or medical condition  Airway Management Planned: LMA  Additional Equipment:   Intra-op Plan:   Post-operative Plan: Extubation in OR  Informed Consent: I have reviewed the patients History and Physical, chart, labs and discussed the procedure including the risks, benefits and alternatives for the proposed anesthesia with the patient or authorized representative who has indicated his/her understanding and acceptance.     Dental advisory given  Plan Discussed with: CRNA and Anesthesiologist  Anesthesia Plan Comments: (Risks of general anesthesia discussed including, but not limited to, sore throat, hoarse voice, chipped/damaged teeth, injury  to vocal cords, nausea and vomiting, allergic reactions, lung infection, heart attack, stroke, and death. All questions answered. )        Anesthesia Quick Evaluation

## 2022-07-20 ENCOUNTER — Encounter (HOSPITAL_BASED_OUTPATIENT_CLINIC_OR_DEPARTMENT_OTHER): Payer: Self-pay | Admitting: General Surgery

## 2022-07-20 ENCOUNTER — Encounter: Payer: Self-pay | Admitting: Hematology and Oncology

## 2022-07-20 ENCOUNTER — Inpatient Hospital Stay: Payer: Managed Care, Other (non HMO)

## 2022-07-20 ENCOUNTER — Inpatient Hospital Stay (HOSPITAL_BASED_OUTPATIENT_CLINIC_OR_DEPARTMENT_OTHER): Payer: Managed Care, Other (non HMO) | Admitting: Hematology and Oncology

## 2022-07-20 ENCOUNTER — Other Ambulatory Visit: Payer: Managed Care, Other (non HMO)

## 2022-07-20 ENCOUNTER — Encounter: Payer: Self-pay | Admitting: General Practice

## 2022-07-20 ENCOUNTER — Ambulatory Visit (HOSPITAL_BASED_OUTPATIENT_CLINIC_OR_DEPARTMENT_OTHER): Payer: Self-pay | Admitting: Physician Assistant

## 2022-07-20 VITALS — BP 116/71 | HR 75

## 2022-07-20 DIAGNOSIS — Z95828 Presence of other vascular implants and grafts: Secondary | ICD-10-CM | POA: Insufficient documentation

## 2022-07-20 DIAGNOSIS — C50412 Malignant neoplasm of upper-outer quadrant of left female breast: Secondary | ICD-10-CM

## 2022-07-20 DIAGNOSIS — Z5111 Encounter for antineoplastic chemotherapy: Secondary | ICD-10-CM | POA: Diagnosis not present

## 2022-07-20 LAB — CBC WITH DIFFERENTIAL (CANCER CENTER ONLY)
Abs Immature Granulocytes: 0.03 10*3/uL (ref 0.00–0.07)
Basophils Absolute: 0 10*3/uL (ref 0.0–0.1)
Basophils Relative: 1 %
Eosinophils Absolute: 0.1 10*3/uL (ref 0.0–0.5)
Eosinophils Relative: 1 %
HCT: 29.5 % — ABNORMAL LOW (ref 36.0–46.0)
Hemoglobin: 9.9 g/dL — ABNORMAL LOW (ref 12.0–15.0)
Immature Granulocytes: 0 %
Lymphocytes Relative: 50 %
Lymphs Abs: 4.1 10*3/uL — ABNORMAL HIGH (ref 0.7–4.0)
MCH: 29 pg (ref 26.0–34.0)
MCHC: 33.6 g/dL (ref 30.0–36.0)
MCV: 86.5 fL (ref 80.0–100.0)
Monocytes Absolute: 0.7 10*3/uL (ref 0.1–1.0)
Monocytes Relative: 8 %
Neutro Abs: 3.4 10*3/uL (ref 1.7–7.7)
Neutrophils Relative %: 40 %
Platelet Count: 320 10*3/uL (ref 150–400)
RBC: 3.41 MIL/uL — ABNORMAL LOW (ref 3.87–5.11)
RDW: 13.5 % (ref 11.5–15.5)
WBC Count: 8.4 10*3/uL (ref 4.0–10.5)
nRBC: 0 % (ref 0.0–0.2)

## 2022-07-20 LAB — CMP (CANCER CENTER ONLY)
ALT: 12 U/L (ref 0–44)
AST: 15 U/L (ref 15–41)
Albumin: 3.8 g/dL (ref 3.5–5.0)
Alkaline Phosphatase: 60 U/L (ref 38–126)
Anion gap: 6 (ref 5–15)
BUN: 6 mg/dL (ref 6–20)
CO2: 28 mmol/L (ref 22–32)
Calcium: 8.5 mg/dL — ABNORMAL LOW (ref 8.9–10.3)
Chloride: 106 mmol/L (ref 98–111)
Creatinine: 0.77 mg/dL (ref 0.44–1.00)
GFR, Estimated: >60 mL/min (ref >60)
Glucose, Bld: 89 mg/dL (ref 70–99)
Potassium: 3.5 mmol/L (ref 3.5–5.1)
Sodium: 140 mmol/L (ref 135–145)
Total Bilirubin: 0.2 mg/dL — ABNORMAL LOW (ref 0.3–1.2)
Total Protein: 6.1 g/dL — ABNORMAL LOW (ref 6.5–8.1)

## 2022-07-20 MED ORDER — SODIUM CHLORIDE 0.9 % IV SOLN: Freq: Once | INTRAVENOUS | Status: AC

## 2022-07-20 MED ORDER — PALONOSETRON HCL INJECTION 0.25 MG/5ML
0.2500 mg | Freq: Once | INTRAVENOUS | Status: AC
  Administered 2022-07-20: 0.25 mg via INTRAVENOUS
  Filled 2022-07-20: qty 5

## 2022-07-20 MED ORDER — SODIUM CHLORIDE 0.9 % IV SOLN
150.0000 mg | Freq: Once | INTRAVENOUS | Status: AC
  Administered 2022-07-20: 150 mg via INTRAVENOUS
  Filled 2022-07-20: qty 150

## 2022-07-20 MED ORDER — FAMOTIDINE IN NACL 20-0.9 MG/50ML-% IV SOLN
20.0000 mg | Freq: Once | INTRAVENOUS | Status: AC | PRN
  Administered 2022-07-20: 20 mg via INTRAVENOUS

## 2022-07-20 MED ORDER — METHYLPREDNISOLONE SODIUM SUCC 125 MG IJ SOLR
125.0000 mg | Freq: Once | INTRAMUSCULAR | Status: AC | PRN
  Administered 2022-07-20: 125 mg via INTRAVENOUS

## 2022-07-20 MED ORDER — DIPHENHYDRAMINE HCL 50 MG/ML IJ SOLN
50.0000 mg | Freq: Once | INTRAMUSCULAR | Status: AC | PRN
  Administered 2022-07-20: 50 mg via INTRAVENOUS

## 2022-07-20 MED ORDER — SODIUM CHLORIDE 0.9 % IV SOLN
600.0000 mg/m2 | Freq: Once | INTRAVENOUS | Status: AC
  Administered 2022-07-20: 1100 mg via INTRAVENOUS
  Filled 2022-07-20: qty 55

## 2022-07-20 MED ORDER — SODIUM CHLORIDE 0.9 % IV SOLN: Freq: Once | INTRAVENOUS | Status: DC | PRN

## 2022-07-20 MED ORDER — SODIUM CHLORIDE 0.9% FLUSH
10.0000 mL | Freq: Once | INTRAVENOUS | Status: AC
  Administered 2022-07-20: 10 mL

## 2022-07-20 MED ORDER — HEPARIN SOD (PORK) LOCK FLUSH 100 UNIT/ML IV SOLN
500.0000 [IU] | Freq: Once | INTRAVENOUS | Status: AC | PRN
  Administered 2022-07-20: 500 [IU]

## 2022-07-20 MED ORDER — SODIUM CHLORIDE 0.9% FLUSH
10.0000 mL | INTRAVENOUS | Status: DC | PRN
  Administered 2022-07-20: 10 mL

## 2022-07-20 MED ORDER — SODIUM CHLORIDE 0.9 % IV SOLN
10.0000 mg | Freq: Once | INTRAVENOUS | Status: AC
  Administered 2022-07-20: 10 mg via INTRAVENOUS
  Filled 2022-07-20: qty 10

## 2022-07-20 MED ORDER — DOXORUBICIN HCL CHEMO IV INJECTION 2 MG/ML
60.0000 mg/m2 | Freq: Once | INTRAVENOUS | Status: AC
  Administered 2022-07-20: 110 mg via INTRAVENOUS
  Filled 2022-07-20: qty 55

## 2022-07-20 NOTE — Patient Instructions (Signed)
[facility]  Discharge Instructions: Thank you for choosing [facility] to provide your oncology and hematology care.   If you have a lab appointment with the [facility], please go directly to the [facility] and check in at the registration area.   Wear comfortable clothing and clothing appropriate for easy access to any Portacath or PICC line.   We strive to give you quality time with your provider. You may need to reschedule your appointment if you arrive late (15 or more minutes).  Arriving late affects you and other patients whose appointments are after yours.  Also, if you miss three or more appointments without notifying the office, you may be dismissed from the clinic at the provider's discretion.      For prescription refill requests, have your pharmacy contact our office and allow 72 hours for refills to be completed.    Today you received the following chemotherapy and/or immunotherapy agents Adriamycin, Cytoxan      To help prevent nausea and vomiting after your treatment, we encourage you to take your nausea medication as directed.  BELOW ARE SYMPTOMS THAT SHOULD BE REPORTED IMMEDIATELY: *FEVER GREATER THAN 100.4 F (38 C) OR HIGHER *CHILLS OR SWEATING *NAUSEA AND VOMITING THAT IS NOT CONTROLLED WITH YOUR NAUSEA MEDICATION *UNUSUAL SHORTNESS OF BREATH *UNUSUAL BRUISING OR BLEEDING *URINARY PROBLEMS (pain or burning when urinating, or frequent urination) *BOWEL PROBLEMS (unusual diarrhea, constipation, pain near the anus) TENDERNESS IN MOUTH AND THROAT WITH OR WITHOUT PRESENCE OF ULCERS (sore throat, sores in mouth, or a toothache) UNUSUAL RASH, SWELLING OR PAIN  UNUSUAL VAGINAL DISCHARGE OR ITCHING   Items with * indicate a potential emergency and should be followed up as soon as possible or go to the Emergency Department if any problems should occur.  Please show the CHEMOTHERAPY ALERT CARD or IMMUNOTHERAPY ALERT CARD  at check-in to the Emergency Department and triage nurse.  Should you have questions after your visit or need to cancel or reschedule your appointment, please contact [facility]  Dept: [phone]  and follow the prompts.  Office hours are 8:00 a.m. to 4:30 p.m. Monday - Friday. Please note that voicemails left after 4:00 p.m. may not be returned until the following business day.  We are closed weekends and major holidays. You have access to a nurse at all times for urgent questions. Please call the main number to the clinic Dept: [phone] and follow the prompts.   For any non-urgent questions, you may also contact your provider using MyChart. We now offer e-Visits for anyone 67 and older to request care online for non-urgent symptoms. For details visit [email or web address].   Also download the MyChart app! Go to the app store, search "MyChart", open the app, select [facility], and log in with your MyChart username and password.  Doxorubicin Injection What is this medication? DOXORUBICIN (dox oh ROO bi sin) treats some types of cancer. It works by slowing down the growth of cancer cells. This medicine may be used for other purposes; ask your health care provider or pharmacist if you have questions. COMMON BRAND NAME(S): Adriamycin, Adriamycin PFS, Adriamycin RDF, Rubex What should I tell my care team before I take this medication? They need to know if you have any of these conditions: Heart disease History of low blood cell levels caused by a medication Liver disease Recent or ongoing radiation An unusual or allergic reaction to doxorubicin, other medications,  foods, dyes, or preservatives If you or your partner are pregnant or trying to get pregnant Breast-feeding How should I use this medication? This medication is injected into a vein. It is given by your care team in a hospital or clinic setting. Talk to your care team about the use of this  medication in children. Special care may be needed. Overdosage: If you think you have taken too much of this medicine contact a poison control center or emergency room at once. NOTE: This medicine is only for you. Do not share this medicine with others. What if I miss a dose? Keep appointments for follow-up doses. It is important not to miss your dose. Call your care team if you are unable to keep an appointment. What may interact with this medication? 6-mercaptopurine Paclitaxel Phenytoin St. John's wort Trastuzumab Verapamil This list may not describe all possible interactions. Give your health care provider a list of all the medicines, herbs, non-prescription drugs, or dietary supplements you use. Also tell them if you smoke, drink alcohol, or use illegal drugs. Some items may interact with your medicine. What should I watch for while using this medication? Your condition will be monitored carefully while you are receiving this medication. You may need blood work while taking this medication. This medication may make you feel generally unwell. This is not uncommon as chemotherapy can affect healthy cells as well as cancer cells. Report any side effects. Continue your course of treatment even though you feel ill unless your care team tells you to stop. There is a maximum amount of this medication you should receive throughout your life. The amount depends on the medical condition being treated and your overall health. Your care team will watch how much of this medication you receive. Tell your care team if you have taken this medication before. Your urine may turn red for a few days after your dose. This is not blood. If your urine is dark or brown, call your care team. In some cases, you may be given additional medications to help with side effects. Follow all directions for their use. This medication may increase your risk of getting an infection. Call your care team for advice if you get a  fever, chills, sore throat, or other symptoms of a cold or flu. Do not treat yourself. Try to avoid being around people who are sick. This medication may increase your risk to bruise or bleed. Call your care team if you notice any unusual bleeding. Talk to your care team about your risk of cancer. You may be more at risk for certain types of cancers if you take this medication. You should make sure that you get enough Coenzyme Q10 while you are taking this medication. Discuss the foods you eat and the vitamins you take with your care team. Talk to your care team if you or your partner may be pregnant. Serious birth defects can occur if you take this medication during pregnancy and for 6 months after the last dose. Contraception is recommended while taking this medication and for 6 months after the last dose. Your care team can help you find the option that works for you. If your partner can get pregnant, use a condom while taking this medication and for 6 months after the last dose. Do not breastfeed while taking this medication. This medication may cause infertility. Talk to your care team if you are concerned about your fertility. What side effects may I notice from receiving this medication? Side  effects that you should report to your care team as soon as possible: Allergic reactions--skin rash, itching, hives, swelling of the face, lips, tongue, or throat Heart failure--shortness of breath, swelling of the ankles, feet, or hands, sudden weight gain, unusual weakness or fatigue Heart rhythm changes--fast or irregular heartbeat, dizziness, feeling faint or lightheaded, chest pain, trouble breathing Infection--fever, chills, cough, sore throat, wounds that don't heal, pain or trouble when passing urine, general feeling of discomfort or being unwell Low red blood cell level--unusual weakness or fatigue, dizziness, headache, trouble breathing Painful swelling, warmth, or redness of the skin, blisters  or sores at the infusion site Unusual bruising or bleeding Side effects that usually do not require medical attention (report to your care team if they continue or are bothersome): Diarrhea Hair loss Nausea Pain, redness, or swelling with sores inside the mouth or throat Red urine This list may not describe all possible side effects. Call your doctor for medical advice about side effects. You may report side effects to FDA at 1-800-FDA-1088. Where should I keep my medication? This medication is given in a hospital or clinic. It will not be stored at home. NOTE: This sheet is a summary. It may not cover all possible information. If you have questions about this medicine, talk to your doctor, pharmacist, or health care provider.  2023 Elsevier/Gold Standard (2021-09-28 00:00:00)  Cyclophosphamide Injection What is this medication? CYCLOPHOSPHAMIDE (sye kloe FOSS fa mide) treats some types of cancer. It works by slowing down the growth of cancer cells. This medicine may be used for other purposes; ask your health care provider or pharmacist if you have questions. COMMON BRAND NAME(S): Cyclophosphamide, Cytoxan, Neosar What should I tell my care team before I take this medication? They need to know if you have any of these conditions: Heart disease Irregular heartbeat or rhythm Infection Kidney problems Liver disease Low blood cell levels (white cells, platelets, or red blood cells) Lung disease Previous radiation Trouble passing urine An unusual or allergic reaction to cyclophosphamide, other medications, foods, dyes, or preservatives Pregnant or trying to get pregnant Breast-feeding How should I use this medication? This medication is injected into a vein. It is given by your care team in a hospital or clinic setting. Talk to your care team about the use of this medication in children. Special care may be needed. Overdosage: If you think you have taken too much of this medicine  contact a poison control center or emergency room at once. NOTE: This medicine is only for you. Do not share this medicine with others. What if I miss a dose? Keep appointments for follow-up doses. It is important not to miss your dose. Call your care team if you are unable to keep an appointment. What may interact with this medication? Amphotericin B Amiodarone Azathioprine Certain antivirals for HIV or hepatitis Certain medications for blood pressure, such as enalapril, lisinopril, quinapril Cyclosporine Diuretics Etanercept Indomethacin Medications that relax muscles Metronidazole Natalizumab Tamoxifen Warfarin This list may not describe all possible interactions. Give your health care provider a list of all the medicines, herbs, non-prescription drugs, or dietary supplements you use. Also tell them if you smoke, drink alcohol, or use illegal drugs. Some items may interact with your medicine. What should I watch for while using this medication? This medication may make you feel generally unwell. This is not uncommon as chemotherapy can affect healthy cells as well as cancer cells. Report any side effects. Continue your course of treatment even though you feel  ill unless your care team tells you to stop. You may need blood work while you are taking this medication. This medication may increase your risk of getting an infection. Call your care team for advice if you get a fever, chills, sore throat, or other symptoms of a cold or flu. Do not treat yourself. Try to avoid being around people who are sick. Avoid taking medications that contain aspirin, acetaminophen, ibuprofen, naproxen, or ketoprofen unless instructed by your care team. These medications may hide a fever. Be careful brushing or flossing your teeth or using a toothpick because you may get an infection or bleed more easily. If you have any dental work done, tell your dentist you are receiving this medication. Drink water or  other fluids as directed. Urinate often, even at night. Some products may contain alcohol. Ask your care team if this medication contains alcohol. Be sure to tell all care teams you are taking this medicine. Certain medicines, like metronidazole and disulfiram, can cause an unpleasant reaction when taken with alcohol. The reaction includes flushing, headache, nausea, vomiting, sweating, and increased thirst. The reaction can last from 30 minutes to several hours. Talk to your care team if you wish to become pregnant or think you might be pregnant. This medication can cause serious birth defects if taken during pregnancy and for 1 year after the last dose. A negative pregnancy test is required before starting this medication. A reliable form of contraception is recommended while taking this medication and for 1 year after the last dose. Talk to your care team about reliable forms of contraception. Do not father a child while taking this medication and for 4 months after the last dose. Use a condom during this time period. Do not breast-feed while taking this medication or for 1 week after the last dose. This medication may cause infertility. Talk to your care team if you are concerned about your fertility. Talk to your care team about your risk of cancer. You may be more at risk for certain types of cancer if you take this medication. What side effects may I notice from receiving this medication? Side effects that you should report to your care team as soon as possible: Allergic reactions--skin rash, itching, hives, swelling of the face, lips, tongue, or throat Dry cough, shortness of breath or trouble breathing Heart failure--shortness of breath, swelling of the ankles, feet, or hands, sudden weight gain, unusual weakness or fatigue Heart muscle inflammation--unusual weakness or fatigue, shortness of breath, chest pain, fast or irregular heartbeat, dizziness, swelling of the ankles, feet, or hands Heart  rhythm changes--fast or irregular heartbeat, dizziness, feeling faint or lightheaded, chest pain, trouble breathing Infection--fever, chills, cough, sore throat, wounds that don't heal, pain or trouble when passing urine, general feeling of discomfort or being unwell Kidney injury--decrease in the amount of urine, swelling of the ankles, hands, or feet Liver injury--right upper belly pain, loss of appetite, nausea, light-colored stool, dark yellow or brown urine, yellowing skin or eyes, unusual weakness or fatigue Low red blood cell level--unusual weakness or fatigue, dizziness, headache, trouble breathing Low sodium level--muscle weakness, fatigue, dizziness, headache, confusion Red or dark brown urine Unusual bruising or bleeding Side effects that usually do not require medical attention (report to your care team if they continue or are bothersome): Hair loss Irregular menstrual cycles or spotting Loss of appetite Nausea Pain, redness, or swelling with sores inside the mouth or throat Vomiting This list may not describe all possible side effects. Call  your doctor for medical advice about side effects. You may report side effects to FDA at 1-800-FDA-1088. Where should I keep my medication? This medication is given in a hospital or clinic. It will not be stored at home. NOTE: This sheet is a summary. It may not cover all possible information. If you have questions about this medicine, talk to your doctor, pharmacist, or health care provider.  2023 Elsevier/Gold Standard (2021-07-12 00:00:00)

## 2022-07-20 NOTE — Progress Notes (Signed)
Hypersensitivity Reaction note  Date of event: 07/20/22 Time of event: 15:57 Generic name of drug involved: Adriamycin Name of provider notified of the hypersensitivity reaction: Anda Kraft PA/Dr. Chryl Heck Was agent that likely caused hypersensitivity reaction added to Allergies List within EMR? yes Chain of events including reaction signs/symptoms, treatment administered, and outcome (e.g., drug resumed; drug discontinued; sent to Emergency Department; etc.) At the completion of the adriamycin, during the flush, the patient c/o lower back pain and right leg pain. Patient became very tearful and anxious. Solumedrol, pepcid, and benadryl were administered. See MAR for times. VS stable throughout. Anda Kraft PA at chairside at 16:00 and remained at chairside. Report given to [facility].  Scot Dock, RN 07/20/2022 4:40 PM

## 2022-07-20 NOTE — Progress Notes (Signed)
[facility] Note  Followed up with Jeimy in infusion to connect in person, meeting her mom Delores as well. Brought a handmade blessing blanket as a tangible sign of support and encouragement.  Niha notes that "it still hasn't quite sunk in yet" that she is starting cancer treatment, but that she is processing step by step (such as having her port placed and coming in for her first infusion). She reports that she is feeling well supported by family, friends, and medical team and that she wants to take advantage of support programming as she adjusts.  Provided pastoral presence, empathic listening, normalization of feelings, and emotional support. Seneca plans to phone chaplain to set up a follow-up in-person appointment.     [place], [place], [facility] Pager [phone] Voicemail [phone]

## 2022-07-20 NOTE — Progress Notes (Signed)
DATE:  07/20/22                                        X CHEMO/IMMUNOTHERAPY REACTION            MD: Chryl Heck   AGENT/BLOOD PRODUCT RECEIVING TODAY:              Adriamycin, Cytoxan   AGENT/BLOOD PRODUCT RECEIVING IMMEDIATELY PRIOR TO REACTION:          Adriamycin   VS: BP:     138/96   P:       95       SPO2:       100% 2L                BP:     126/98   P:       76       SPO2:       100% RA     REACTION(S):           right arm and low back pain, throat scratching sensation   PREMEDS:     aloxi 0.25 mg IV, emend 150 mg IV, decadron 10 mg IV   INTERVENTION: Benadryl 50 mg IV, pepcid 20 mg IV, solu-medrol 125 mg IV   Review of Systems  Review of Systems  HENT:  Positive for sore throat.   Musculoskeletal:  Positive for arthralgias and back pain.  All other systems reviewed and are negative.    Physical Exam  Physical Exam Vitals and nursing note reviewed.  Constitutional:      Appearance: She is well-developed. She is not ill-appearing or toxic-appearing.  HENT:     Head: Normocephalic.     Nose: Nose normal.     Mouth/Throat:     Mouth: Mucous membranes are moist. No angioedema.     Pharynx: Oropharynx is clear. Uvula midline. No pharyngeal swelling, posterior oropharyngeal erythema or uvula swelling.  Eyes:     Conjunctiva/sclera: Conjunctivae normal.  Neck:     Vascular: No JVD.  Cardiovascular:     Rate and Rhythm: Normal rate and regular rhythm.     Pulses: Normal pulses.          Radial pulses are 2+ on the right side and 2+ on the left side.     Heart sounds: Normal heart sounds.  Pulmonary:     Effort: Pulmonary effort is normal.     Breath sounds: Normal breath sounds.  Abdominal:     General: There is no distension.  Musculoskeletal:     Cervical back: Normal range of motion.     Right lower leg: No edema.     Left lower leg: No edema.     Comments: Tenderness to palpation of right paraspinal muscles of lumbar spine. No midline tenderness  Skin:     General: Skin is warm and dry.     Findings: No rash.     Comments: Equal tactile temperature in all extremities  Neurological:     Mental Status: She is oriented to person, place, and time.  Psychiatric:        Mood and Affect: Mood is anxious. Affect is tearful.     OUTCOME:                Patient became symptomatic after adriamycin. She became anxious and tearful. Emergency medications given as  above. Patient returned to baseline although still mildly anxious. She was given the option to return tomorrow for cytoxan although feels strong enough to proceed with treatment. Patient tolerated remainder of treatment without adverse effects. Dr. Chryl Heck made aware and is agreeable with plan.

## 2022-07-20 NOTE — Progress Notes (Signed)
[place] CONSULT NOTE  Patient Care Team: Paruchuri, Saunders Glance, MD as PCP - General (Internal Medicine) [name], MD as Consulting Physician (Obstetrics and Gynecology) Mauro Kaufmann, RN as Oncology Nurse Navigator Rockwell Germany, RN as Oncology Nurse Navigator Benay Pike, MD as Consulting Physician (Hematology and Oncology) Jovita Kussmaul, MD as Consulting Physician (General Surgery) Kyung Rudd, MD as Consulting Physician (Radiation Oncology)  CHIEF COMPLAINTS/PURPOSE OF CONSULTATION:  New diagnosis of BC  ASSESSMENT & PLAN:   Malignant neoplasm of upper-outer quadrant of left female breast [facility]) This is a very pleasant 42 year old female patient, premenopausal with 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  She denies any family history of breast cancer or ovarian cancer in the immediate family, may have a second cousin with breast cancer. We think she will be an excellent candidate for neoadjuvant therapy given large tumor size despite no definitive evidence of lymph node involvement especially with her high-grade, high proliferation index invasive ductal carcinoma which is functionally triple negative.I have discussed the regimen very clearly, mechanism of action of chemo, adverse effects including but not limited to fatigue, nausea, vomiting, diarrhea, increased risk of infections, neuropathy, cardiotoxicity.  She will also be scheduled for a chemo class.  She will benefit from a preop MRI and Port-A-Cath placement.  She is here to proceed with first cycle of neoadjuvant Adriamycin and cyclophosphamide.  Baseline echo satisfactory.  No change in physical exam.  Palpable left breast upper outer quadrant mass close to axillary tail measuring 3 cm with no other findings.  Okay to proceed with chemotherapy today if  labs are all within parameters. She will return to clinic in 2 weeks per integrated scheduling for her second cycle of chemotherapy   No orders of the defined types were placed in this encounter.    HISTORY OF PRESENTING ILLNESS:  Nicole Maddox 42 y.o. female is here because of BC.  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  This showed a partially circumscribed small mass in the left breast with no other defined masses or architectural distortion, no significant areas of asymmetry.  Targeted ultrasound was performed which showed several cysts without significant complicating features in the medial left breast largest at 933 cm from the nipple measuring 1.3 x 1.2 x 1.2.  No solid masses or suspicious lesions.  She also was found to have benign left breast cysts.  She then went back in January with a palpable left breast lump which once again showed suspicious 2.7 cm upper outer left breast mass and 2 abnormal left axillary lymph nodes with cortical thickening.  Tissue sampling of the left breast mass in one of the abnormal left axillary lymph nodes recommended  Left breast needle core biopsy at 1:00 10 cm from the nipple showed high-grade invasive ductal carcinoma, axillary tail with lymph node and reactive germinal centers, no metastatic carcinoma identified.  Prognostics from the 1:00 left breast needle core biopsy showed ER 30% positive weak staining intensity PR 0% negative, Ki-67 of 95% negative for HER2 0  She was seen by Dr. Marlou Starks and referred to medical oncology  for neoadjuvant recommendations. She is here before her planned first cycle of Adriamycin and cyclophosphamide.  Baseline echo satisfactory.  She  denies any new health issues.  Mom is with her today for the first planned cycle of chemotherapy.  She is understandably anxious about having to go through chemo.  Rest of the pertinent 10 point ROS reviewed and negative  MEDICAL HISTORY:  Past Medical History:  Diagnosis Date   Breast cancer ([place]) 06/23/2022   Family history of stomach cancer    GERD (gastroesophageal reflux disease)    Hyperthyroidism    Thyroid disease     SURGICAL HISTORY: Past Surgical History:  Procedure Laterality Date   BREAST BIOPSY Left 06/23/2022   [place] LT BREAST BX W LOC DEV 1ST LESION IMG BX SPEC US GUIDE 06/23/2022 [facility] MAMMOGRAPHY   PORTACATH PLACEMENT Right 07/19/2022   Procedure: INSERTION PORT-A-CATH;  Surgeon: Jovita Kussmaul, MD;  Location: [place];  Service: General;  Laterality: Right;  60 MIN ROOM 8   WISDOM TOOTH EXTRACTION      SOCIAL HISTORY: Social History   Socioeconomic History   Marital status: Single    Spouse name: Not on file   Number of children: Not on file   Years of education: Not on file   Highest education level: Not on file  Occupational History   Not on file  Tobacco Use   Smoking status: Never   Smokeless tobacco: Never  Vaping Use   Vaping Use: Never used  Substance and Sexual Activity   Alcohol use: Yes    Comment: socially   Drug use: No   Sexual activity: Yes    Partners: Male    Birth control/protection: None    Comment: intercourse age 57, more than 6 sexual parters,   Other Topics Concern   Not on file  Social History Narrative   Not on file   Social Determinants of Health   Financial Resource Strain: Low Risk  (07/04/2022)   Overall Financial Resource Strain (CARDIA)    Difficulty of Paying Living Expenses: Not very hard  Food Insecurity: No Food Insecurity (06/28/2022)   Hunger Vital Sign    Worried About Running Out of Food in the Last Year: Never true    Ran Out of Food in the Last Year: Never true  Transportation Needs: No  Transportation Needs (06/28/2022)   PRAPARE - Hydrologist (Medical): No    Lack of Transportation (Non-Medical): No  Physical Activity: Not on file  Stress: Not on file  Social Connections: Not on file  Intimate Partner Violence: Not At Risk (06/28/2022)   Humiliation, Afraid, Rape, and Kick questionnaire    Fear of Current or Ex-Partner: No    Emotionally Abused: No    Physically Abused: No    Sexually Abused: No    FAMILY HISTORY: Family History  Problem Relation Age of Onset   Heart attack Father 19   Stomach cancer Maternal Aunt        dx > 50   Throat cancer Maternal Uncle        dx 36s   Stomach cancer Maternal Uncle        dx > 50   Heart Problems Maternal Grandmother    Breast cancer Cousin        mother's maternal first cousin    ALLERGIES:  has No Known Allergies.  MEDICATIONS:  Current Outpatient Medications  Medication Sig Dispense Refill   Ashwagandha 300  MG TABS Take by mouth. Also has selenium, zinc, iodine, L-tyrosine. Named "Thyroid Defend". (Patient not taking: Reported on 07/13/2022)     dexamethasone (DECADRON) 4 MG tablet Take 2 tablets (8 mg total) by mouth daily for 3 days. Start the day after doxorubicin/cyclophosphamide chemotherapy. Take with food. (Patient not taking: Reported on 07/13/2022) 30 tablet 1   FERROCITE 324 MG TABS tablet Take 1 tablet by mouth daily.     folic acid (FOLVITE) 1 MG tablet Take 1 mg by mouth daily.     ketoconazole (NIZORAL) 2 % shampoo Apply topically.     lidocaine-prilocaine (EMLA) cream Apply to affected area once (Patient not taking: Reported on 07/13/2022) 30 g 3   lidocaine-prilocaine (EMLA) cream Apply 1 Application topically as needed. 30 g 0   methimazole (TAPAZOLE) 5 MG tablet Take 15 mg by mouth every morning.     Multiple Vitamin (MULTIVITAMIN PO) Take by mouth.     omeprazole (PRILOSEC) 40 MG capsule Take 40 mg by mouth daily.     ondansetron (ZOFRAN) 8 MG tablet Take 1 tab (8 mg) by  mouth every 8 hrs as needed for nausea/vomiting. Start third day after doxorubicin/cyclophosphamide chemotherapy. (Patient not taking: Reported on 07/13/2022) 30 tablet 1   oxyCODONE (ROXICODONE) 5 MG immediate release tablet Take 1 tablet (5 mg total) by mouth every 6 (six) hours as needed for severe pain. 10 tablet 0   PREBIOTIC PRODUCT PO Take by mouth.     prochlorperazine (COMPAZINE) 10 MG tablet Take 1 tablet (10 mg total) by mouth every 6 (six) hours as needed for nausea or vomiting. (Patient not taking: Reported on 07/13/2022) 30 tablet 1   No current facility-administered medications for this visit.     PHYSICAL EXAMINATION: ECOG PERFORMANCE STATUS: 0 - Asymptomatic  Vitals:   07/20/22 1326  BP: 114/71  Pulse: 83  Resp: 16  Temp: 98.1 F (36.7 C)  SpO2: 100%   Filed Weights   07/20/22 1326  Weight: 168 lb 14.4 oz (76.6 kg)    GENERAL:alert, no distress and comfortable Neck: No palpable cervical adenopathy Breast: Bilateral breasts inspected and palpated.  Palpable cyst noted in bilateral breast.  The mass of concern is in the left upper outer quadrant closer to the tail measuring around 3 cm in largest dimension.  No skin changes.  No nipple changes.  No significant change compared to last visit.  I have reviewed the data as listed Lab Results  Component Value Date   WBC 8.4 07/20/2022   HGB 9.9 (L) 07/20/2022   HCT 29.5 (L) 07/20/2022   MCV 86.5 07/20/2022   PLT 320 07/20/2022     Chemistry      Component Value Date/Time   NA 140 07/20/2022 1311   K 3.5 07/20/2022 1311   CL 106 07/20/2022 1311   CO2 28 07/20/2022 1311   BUN 6 07/20/2022 1311   CREATININE 0.77 07/20/2022 1311      Component Value Date/Time   CALCIUM 8.5 (L) 07/20/2022 1311   ALKPHOS 60 07/20/2022 1311   AST 15 07/20/2022 1311   ALT 12 07/20/2022 1311   BILITOT 0.2 (L) 07/20/2022 1311       RADIOGRAPHIC STUDIES: I have personally reviewed the radiological images as listed and agreed  with the findings in the report. DG Chest Port 1 View  Result Date: 07/19/2022 CLINICAL DATA:  Follow-up Port-A-Cath placement EXAM: PORTABLE CHEST 1 VIEW COMPARISON:  None Available. FINDINGS: Power port placed from a right subclavian  approach. Catheter tip is in the SVC just above the right atrium. No pneumothorax. The patient has not taken a deep inspiration, but allowing that the lungs are clear. No abnormal bone finding. IMPRESSION: Power port placed from a right subclavian approach. Catheter tip in the SVC just above the right atrium. No pneumothorax. Electronically Signed   By: Nelson Chimes M.D.   On: 07/19/2022 15:29   DG C-Arm 1-60 Min-No Report  Result Date: 07/19/2022 Fluoroscopy was utilized by the requesting physician.  No radiographic interpretation.   MR BREAST BILATERAL W WO CONTRAST INC CAD  Result Date: 07/11/2022 CLINICAL DATA:  Recent diagnosis of LEFT breast invasive ductal carcinoma with high-grade DCIS in the LEFT breast at the 1 o'clock axis (coil shaped clip). Additional history of benign RIGHT breast biopsy on 01/24/2022 with pathology result of benign cyst/fibrocystic change. EXAM: BILATERAL BREAST MRI WITH AND WITHOUT CONTRAST TECHNIQUE: Multiplanar, multisequence MR images of both breasts were obtained prior to and following the intravenous administration of 8 ml of Vueway Three-dimensional MR images were rendered by post-processing of the original MR data on an independent workstation. The three-dimensional MR images were interpreted, and findings are reported in the following complete MRI report for this study. Three dimensional images were evaluated at the independent interpreting workstation using the DynaCAD thin client. COMPARISON:  Previous exams including ultrasound-guided LEFT breast biopsy dated 06/23/2022. FINDINGS: Breast composition: d. Extreme fibroglandular tissue. Background parenchymal enhancement: Marked Right breast: Numerous small cysts within the RIGHT  breast. There are multiple enhancing masses within the RIGHT breast as follows: 1. 1.3 x 1.1 cm irregular enhancing mass within the upper-outer quadrant of the RIGHT breast, at posterior depth, with mixed enhancement kinetics including plateau (series 7, image 75). 2. Irregular enhancing mass within the upper RIGHT breast, 12 o'clock axis region, at anterior depth, measuring 9 x 4 mm, with persistent enhancement kinetics (series 7, image 75). 3. Irregular enhancing mass within the inner RIGHT breast, 3 o'clock axis region, at posterior depth, measuring 8 x 4 mm, with mixed enhancement kinetics including plateau (series 7, image 89). 4. Irregular enhancing mass within the lower inner quadrant of the RIGHT breast, at anterior depth, measuring 6 mm, with mixed enhancement kinetics including plateau (series 7, image 117). Left breast: Numerous small cysts within the LEFT breast. Biopsy-proven invasive ductal carcinoma within the upper-outer quadrant of the LEFT breast, at far posterior depth, localized to the 1 o'clock axis on previous LEFT breast ultrasound, measuring 2.9 x 2.3 x 3.4 cm (AP by transverse by craniocaudal dimensions), with central necrosis (series 7, image 48). Additional irregular enhancing mass within the lower LEFT breast, 6 o'clock axis region, at posterior depth, measuring 1 x 0.6 cm, with mixed enhancement kinetics including plateau (series 7, image 102). Lymph nodes: Mildly prominent lymph nodes in the LEFT axilla, 1 of which was sampled with benign pathology result. No enlarged or morphologically abnormal lymph nodes are identified within the RIGHT axilla or within the bilateral internal mammary chain regions. Ancillary findings:  None. IMPRESSION: 1. Biopsy-proven invasive ductal carcinoma within the upper-outer quadrant of the LEFT breast, at far posterior depth, localized to the 1 o'clock axis on recent LEFT breast ultrasound, with central necrosis, measuring 2.9 x 2.3 x 3.4 cm. 2. Multiple  (4) irregular enhancing masses within the RIGHT breast. MRI-guided biopsies are recommended to exclude contralateral disease. 3. Irregular enhancing mass within the lower LEFT breast, 6 o'clock axis, at posterior depth, measuring 1 cm, suspicious for multicentric disease of the  LEFT breast. MRI-guided biopsy is recommended. RECOMMENDATION: 1. MRI-guided biopsies of the 2 irregular enhancing masses in the upper RIGHT breast (labeled #1 and #2 in findings section above) and of the irregular enhancing mass within the lower LEFT breast at posterior depth measuring 1 cm. 2. If after these additional bilateral MRI-guided biopsies, breast conservation is still being considered, recommend separate appointment be scheduled for MRI-guided biopsies of the 2 irregular enhancing masses in the inner RIGHT breast (labeled #3 and #4 in findings section above). BI-RADS CATEGORY  4: Suspicious. Electronically Signed   By: Franki Cabot M.D.   On: 07/11/2022 09:50  ECHOCARDIOGRAM COMPLETE  Result Date: 07/05/2022    ECHOCARDIOGRAM REPORT   Patient Name:   Nicole Maddox Date of Exam: 07/05/2022 Medical Rec #:  [id number]       Height:       64.0 in Accession #:    [id number]      Weight:       165.4 lb Date of Birth:  1981/06/02       BSA:          1.805 m Patient Age:    27 years        BP:           121/84 mmHg Patient Gender: F               HR:           76 bpm. Exam Location:  Inpatient Procedure: 2D Echo, Color Doppler, Cardiac Doppler and Strain Analysis Indications:    Pre-Chemo Evaluation  History:        Patient has no prior history of Echocardiogram examinations.                 Risk Factors:Breast Cancer.  Sonographer:    Raquel Sarna Senior RDCS Referring Phys: [id number] [place]  Sonographer Comments: Suboptimal apical window due to positioning; unable to scan in left lateral decubitus due to pain from recent biopsy IMPRESSIONS  1. Left ventricular ejection fraction, by estimation, is 60 to 65%. The left ventricle has  normal function. The left ventricle has no regional wall motion abnormalities. Left ventricular diastolic parameters were normal. The average left ventricular global longitudinal strain is -21.0 %. The global longitudinal strain is normal.  2. Right ventricular systolic function is normal. The right ventricular size is normal. Tricuspid regurgitation signal is inadequate for assessing PA pressure.  3. The mitral valve is normal in structure. No evidence of mitral valve regurgitation. No evidence of mitral stenosis.  4. The aortic valve is grossly normal. Aortic valve regurgitation is not visualized. No aortic stenosis is present.  5. The inferior vena cava is normal in size with greater than 50% respiratory variability, suggesting right atrial pressure of 3 mmHg. Comparison(s): No prior Echocardiogram. Conclusion(s)/Recommendation(s): Normal biventricular function without evidence of hemodynamically significant valvular heart disease. FINDINGS  Left Ventricle: Left ventricular ejection fraction, by estimation, is 60 to 65%. The left ventricle has normal function. The left ventricle has no regional wall motion abnormalities. The average left ventricular global longitudinal strain is -21.0 %. The global longitudinal strain is normal. The left ventricular internal cavity size was normal in size. There is no left ventricular hypertrophy. Left ventricular diastolic parameters were normal. Right Ventricle: The right ventricular size is normal. No increase in right ventricular wall thickness. Right ventricular systolic function is normal. Tricuspid regurgitation signal is inadequate for assessing PA pressure. Left Atrium: Left atrial size was normal in size.  Right Atrium: Right atrial size was normal in size. Pericardium: Trivial pericardial effusion is present. Mitral Valve: The mitral valve is normal in structure. No evidence of mitral valve regurgitation. No evidence of mitral valve stenosis. Tricuspid Valve: The  tricuspid valve is normal in structure. Tricuspid valve regurgitation is not demonstrated. No evidence of tricuspid stenosis. Aortic Valve: The aortic valve is grossly normal. Aortic valve regurgitation is not visualized. No aortic stenosis is present. Pulmonic Valve: The pulmonic valve was grossly normal. Pulmonic valve regurgitation is trivial. No evidence of pulmonic stenosis. Aorta: The aortic root, ascending aorta, aortic arch and descending aorta are all structurally normal, with no evidence of dilitation or obstruction. Venous: The inferior vena cava is normal in size with greater than 50% respiratory variability, suggesting right atrial pressure of 3 mmHg. IAS/Shunts: The atrial septum is grossly normal.  LEFT VENTRICLE PLAX 2D LVIDd:         4.40 cm   Diastology LVIDs:         2.90 cm   LV e' medial:    9.68 cm/s LV PW:         0.80 cm   LV E/e' medial:  8.2 LV IVS:        0.70 cm   LV e' lateral:   15.20 cm/s LVOT diam:     2.00 cm   LV E/e' lateral: 5.2 LV SV:         56 LV SV Index:   31        2D Longitudinal Strain LVOT Area:     3.14 cm  2D Strain GLS (A2C):   -22.5 %                          2D Strain GLS (A3C):   -19.9 %                          2D Strain GLS (A4C):   -20.5 %                          2D Strain GLS Avg:     -21.0 % RIGHT VENTRICLE RV S prime:     9.25 cm/s TAPSE (M-mode): 1.6 cm LEFT ATRIUM             Index        RIGHT ATRIUM           Index LA diam:        3.30 cm 1.83 cm/m   RA Area:     12.30 cm LA Vol (A2C):   29.6 ml 16.40 ml/m  RA Volume:   24.90 ml  13.80 ml/m LA Vol (A4C):   26.4 ml 14.63 ml/m LA Biplane Vol: 28.2 ml 15.63 ml/m  AORTIC VALVE LVOT Vmax:   99.90 cm/s LVOT Vmean:  65.900 cm/s LVOT VTI:    0.179 m  AORTA Ao Root diam: 3.00 cm Ao Asc diam:  2.70 cm MITRAL VALVE MV Area (PHT): 4.49 cm    SHUNTS MV Decel Time: 169 msec    Systemic VTI:  0.18 m MV E velocity: 79.30 cm/s  Systemic Diam: 2.00 cm MV A velocity: 57.80 cm/s MV E/A ratio:  1.37 Buford Dresser MD Electronically signed by Buford Dresser MD Signature Date/Time: 07/05/2022/2:09:30 PM    Final    [place] LT BREAST BX W LOC DEV 1ST LESION IMG  BX SPEC US GUIDE  Addendum Date: 06/29/2022   ADDENDUM REPORT: 06/29/2022 14:05 ADDENDUM: Pathology revealed GRADE 3 INVASIVE DUCTAL CARCINOMA, HIGH-GRADE DUCTAL CARCINOMA IN SITU WITH NECROSIS, HIGH NUCLEAR GRADE of the LEFT breast, 1 o'clock, (coil clip) . This was found to be concordant by Dr. Audie Pinto. Pathology revealed LYMPH NODE WITH REACTIVE GERMINAL CENTERS, NO METASTATIC CARCINOMA IDENTIFIED of the LEFT breast axillary tail, (hydromark clip). This was found to be concordant by Dr. Audie Pinto. Pathology results were discussed with the patient by telephone. The patient reported doing well after the biopsies with tenderness at the sites. Post biopsy instructions and care were reviewed and questions were answered. The patient was encouraged to call [facility] for any additional concerns. Per patient request, surgical consultation has been arranged with Dr. Autumn Messing at [facility] on June 27, 2022. Recommend breast MRI given high grade histology, breast density and age of patient. Pathology results reported by Stacie Acres RN on 06/27/2022. Electronically Signed   By: Audie Pinto M.D.   On: 06/29/2022 14:05   Result Date: 06/29/2022 CLINICAL DATA:  42 year old female presenting for biopsy of a left breast mass and a left axillary lymph node. EXAM: ULTRASOUND GUIDED LEFT BREAST CORE NEEDLE BIOPSY [place] AXILLARY NODE CORE BIOPSY LEFT COMPARISON:  Previous exam(s). PROCEDURE: I met with the patient and we discussed the procedure of ultrasound-guided biopsy, including benefits and alternatives. We discussed the high likelihood of a successful procedure. We discussed the risks of the procedure, including infection, bleeding, tissue injury, clip migration, and inadequate sampling. Informed  written consent was given. The usual time-out protocol was performed immediately prior to the procedure. Lesion quadrant: Upper outer quadrant Using sterile technique and 1% Lidocaine as local anesthetic, under direct ultrasound visualization, a 14 gauge spring-loaded device was used to perform biopsy of a mass in the left breast at 1 o'clock using a lateral approach. At the conclusion of the procedure coil shaped tissue marker clip was deployed into the biopsy cavity. Follow up 2 view mammogram was performed and dictated separately. Left axilla Using sterile technique and 1% Lidocaine as local anesthetic, under direct ultrasound visualization, a 14 gauge spring-loaded device was used to perform biopsy of a left axillary lymph node using a lateral approach. At the conclusion of the procedure a HydroMARK shaped tissue marker clip was deployed into the biopsy cavity. Follow up 2 view mammogram was performed and dictated separately. IMPRESSION: Ultrasound guided biopsy of a mass in the left breast at 1 o'clock and of a left axillary lymph node. No apparent complications. Electronically Signed: By: Audie Pinto M.D. On: 06/23/2022 12:06  [place] AXILLARY NODE CORE BIOPSY LEFT  Addendum Date: 06/29/2022   ADDENDUM REPORT: 06/29/2022 14:05 ADDENDUM: Pathology revealed GRADE 3 INVASIVE DUCTAL CARCINOMA, HIGH-GRADE DUCTAL CARCINOMA IN SITU WITH NECROSIS, HIGH NUCLEAR GRADE of the LEFT breast, 1 o'clock, (coil clip) . This was found to be concordant by Dr. Audie Pinto. Pathology revealed LYMPH NODE WITH REACTIVE GERMINAL CENTERS, NO METASTATIC CARCINOMA IDENTIFIED of the LEFT breast axillary tail, (hydromark clip). This was found to be concordant by Dr. Audie Pinto. Pathology results were discussed with the patient by telephone. The patient reported doing well after the biopsies with tenderness at the sites. Post biopsy instructions and care were reviewed and questions were answered. The patient was encouraged  to call [facility] for any additional concerns. Per patient request, surgical consultation has been arranged with Dr. Eddie Dibbles  Marlou Starks at [facility] on June 27, 2022. Recommend breast MRI given high grade histology, breast density and age of patient. Pathology results reported by Stacie Acres RN on 06/27/2022. Electronically Signed   By: Audie Pinto M.D.   On: 06/29/2022 14:05   Result Date: 06/29/2022 CLINICAL DATA:  42 year old female presenting for biopsy of a left breast mass and a left axillary lymph node. EXAM: ULTRASOUND GUIDED LEFT BREAST CORE NEEDLE BIOPSY [place] AXILLARY NODE CORE BIOPSY LEFT COMPARISON:  Previous exam(s). PROCEDURE: I met with the patient and we discussed the procedure of ultrasound-guided biopsy, including benefits and alternatives. We discussed the high likelihood of a successful procedure. We discussed the risks of the procedure, including infection, bleeding, tissue injury, clip migration, and inadequate sampling. Informed written consent was given. The usual time-out protocol was performed immediately prior to the procedure. Lesion quadrant: Upper outer quadrant Using sterile technique and 1% Lidocaine as local anesthetic, under direct ultrasound visualization, a 14 gauge spring-loaded device was used to perform biopsy of a mass in the left breast at 1 o'clock using a lateral approach. At the conclusion of the procedure coil shaped tissue marker clip was deployed into the biopsy cavity. Follow up 2 view mammogram was performed and dictated separately. Left axilla Using sterile technique and 1% Lidocaine as local anesthetic, under direct ultrasound visualization, a 14 gauge spring-loaded device was used to perform biopsy of a left axillary lymph node using a lateral approach. At the conclusion of the procedure a HydroMARK shaped tissue marker clip was deployed into the biopsy cavity. Follow up 2 view mammogram was performed and dictated  separately. IMPRESSION: Ultrasound guided biopsy of a mass in the left breast at 1 o'clock and of a left axillary lymph node. No apparent complications. Electronically Signed: By: Audie Pinto M.D. On: 06/23/2022 12:06  MM CLIP PLACEMENT LEFT  Result Date: 06/23/2022 CLINICAL DATA:  Post procedure mammogram for clip placement EXAM: 3D DIAGNOSTIC LEFT MAMMOGRAM POST ULTRASOUND BIOPSY COMPARISON:  Previous exam(s). FINDINGS: 3D Mammographic images were obtained following ultrasound guided biopsy of a mass in the left breast at 1 o'clock. The biopsy marking clip is in expected position at the site of biopsy. 3D Mammographic images were obtained following ultrasound guided biopsy of a left axillary lymph node. The biopsy marking clip is in expected position at the site of biopsy. IMPRESSION: Appropriate positioning of the coil shaped biopsy marking clip at the site of biopsy in the left breast at 1 o'clock. Appropriate positioning of the [facility] shaped biopsy marking clip at the site of biopsy in the left axilla. Final Assessment: Post Procedure Mammograms for Marker Placement Electronically Signed   By: Audie Pinto M.D.   On: 06/23/2022 12:04  MM DIAG BREAST TOMO UNI LEFT  Addendum Date: 06/21/2022   ADDENDUM REPORT: 06/21/2022 12:50 ADDENDUM: COMPARISON should read: Previous exams. Electronically Signed   By: Margarette Canada M.D.   On: 06/21/2022 12:50   Result Date: 06/21/2022 CLINICAL DATA:  42 year old female with new palpable LEFT breast lump identified on self-examination. EXAM: DIGITAL DIAGNOSTIC UNILATERAL LEFT MAMMOGRAM WITH TOMOSYNTHESIS; ULTRASOUND LEFT BREAST LIMITED TECHNIQUE: Left digital diagnostic mammography and breast tomosynthesis was performed.; Targeted ultrasound examination of the left breast was performed. COMPARISON:  None available. ACR Breast Density Category c: The breast tissue is heterogeneously dense, which may obscure small masses. FINDINGS: Full field, spot  compression and magnification views of the LEFT breast demonstrate a new partially circumscribed partially irregular mass containing pleomorphic calcifications within the  UPPER-OUTER LEFT breast. Targeted ultrasound is performed, showing a 2.7 x 1.9 x 1.9 cm irregular hypoechoic mass at the 1 o'clock position of the LEFT breast 10 cm from the nipple corresponding to the patient's palpable abnormality. Two axillary lymph nodes with cortical thickening noted. IMPRESSION: 1. Suspicious 2.7 cm UPPER-OUTER LEFT breast mass and 2 abnormal LEFT axillary lymph nodes with cortical thickening. Tissue sampling of the LEFT breast mass and 1 of the abnormal LEFT axillary lymph nodes recommended. RECOMMENDATION: Ultrasound-guided biopsies of the LEFT breast mass and a LEFT axillary lymph node, which will be scheduled. I have discussed the findings and recommendations with the patient. If applicable, a reminder letter will be sent to the patient regarding the next appointment. BI-RADS CATEGORY  4: Suspicious. Electronically Signed: By: Margarette Canada M.D. On: 06/21/2022 09:18   US BREAST LTD UNI LEFT INC AXILLA  Addendum Date: 06/21/2022   ADDENDUM REPORT: 06/21/2022 12:50 ADDENDUM: COMPARISON should read: Previous exams. Electronically Signed   By: Margarette Canada M.D.   On: 06/21/2022 12:50   Result Date: 06/21/2022 CLINICAL DATA:  42 year old female with new palpable LEFT breast lump identified on self-examination. EXAM: DIGITAL DIAGNOSTIC UNILATERAL LEFT MAMMOGRAM WITH TOMOSYNTHESIS; ULTRASOUND LEFT BREAST LIMITED TECHNIQUE: Left digital diagnostic mammography and breast tomosynthesis was performed.; Targeted ultrasound examination of the left breast was performed. COMPARISON:  None available. ACR Breast Density Category c: The breast tissue is heterogeneously dense, which may obscure small masses. FINDINGS: Full field, spot compression and magnification views of the LEFT breast demonstrate a new partially circumscribed  partially irregular mass containing pleomorphic calcifications within the UPPER-OUTER LEFT breast. Targeted ultrasound is performed, showing a 2.7 x 1.9 x 1.9 cm irregular hypoechoic mass at the 1 o'clock position of the LEFT breast 10 cm from the nipple corresponding to the patient's palpable abnormality. Two axillary lymph nodes with cortical thickening noted. IMPRESSION: 1. Suspicious 2.7 cm UPPER-OUTER LEFT breast mass and 2 abnormal LEFT axillary lymph nodes with cortical thickening. Tissue sampling of the LEFT breast mass and 1 of the abnormal LEFT axillary lymph nodes recommended. RECOMMENDATION: Ultrasound-guided biopsies of the LEFT breast mass and a LEFT axillary lymph node, which will be scheduled. I have discussed the findings and recommendations with the patient. If applicable, a reminder letter will be sent to the patient regarding the next appointment. BI-RADS CATEGORY  4: Suspicious. Electronically Signed: By: Margarette Canada M.D. On: 06/21/2022 09:18   All questions were answered. The patient knows to call the clinic with any problems, questions or concerns. I spent 30 minutes in the care of this patient including H and P, review of records, counseling and coordination of care.     Benay Pike, MD 07/20/2022 1:50 PM

## 2022-07-20 NOTE — Assessment & Plan Note (Signed)
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  She denies any family history of breast cancer or ovarian cancer in the immediate family, may have a second cousin with breast cancer. We think she will be an excellent candidate for neoadjuvant therapy given large tumor size despite no definitive evidence of lymph node involvement especially with her high-grade, high proliferation index invasive ductal carcinoma which is functionally triple negative.I have discussed the regimen very clearly, mechanism of action of chemo, adverse effects including but not limited to fatigue, nausea, vomiting, diarrhea, increased risk of infections, neuropathy, cardiotoxicity.  She will also be scheduled for a chemo class.  She will benefit from a preop MRI and Port-A-Cath placement.  She is here to proceed with first cycle of neoadjuvant Adriamycin and cyclophosphamide.  Baseline echo satisfactory.  No change in physical exam.  Palpable left breast upper outer quadrant mass close to axillary tail measuring 3 cm with no other findings.  Okay to proceed with chemotherapy today if labs are all within parameters. She will return to clinic in 2 weeks per integrated scheduling for her second cycle of chemotherapy

## 2022-07-21 ENCOUNTER — Ambulatory Visit
Admission: RE | Admit: 2022-07-21 | Discharge: 2022-07-21 | Disposition: A | Discharge status: Home / Self Care | Payer: Managed Care, Other (non HMO) | Source: Ambulatory Visit | Attending: Hematology and Oncology | Admitting: Hematology and Oncology

## 2022-07-21 ENCOUNTER — Ambulatory Visit
Admission: RE | Admit: 2022-07-21 | Discharge: 2022-07-21 | Disposition: A | Discharge status: Home / Self Care | Payer: Managed Care, Other (non HMO) | Source: Ambulatory Visit | Attending: Hematology and Oncology

## 2022-07-21 ENCOUNTER — Other Ambulatory Visit (HOSPITAL_COMMUNITY): Payer: Self-pay | Admitting: Diagnostic Radiology

## 2022-07-21 ENCOUNTER — Encounter: Payer: Self-pay | Admitting: *Deleted

## 2022-07-21 ENCOUNTER — Telehealth: Payer: Self-pay | Admitting: *Deleted

## 2022-07-21 DIAGNOSIS — C50412 Malignant neoplasm of upper-outer quadrant of left female breast: Secondary | ICD-10-CM

## 2022-07-21 DIAGNOSIS — R928 Other abnormal and inconclusive findings on diagnostic imaging of breast: Secondary | ICD-10-CM

## 2022-07-21 MED ORDER — GADOPICLENOL 0.5 MMOL/ML IV SOLN
8.0000 mL | Freq: Once | INTRAVENOUS | Status: AC | PRN
  Administered 2022-07-21: 8 mL via INTRAVENOUS

## 2022-07-21 NOTE — Telephone Encounter (Signed)
Returned completed disability and FMLA forms to [employer] [phone]).  Copy to H.I.M.bin designated for items to be scanned.  Copy mailed to Kandis Ban' address on file. [place] [id number] No further instructions received or actions performed by this nurse.  Noted [employer] form is still pending.

## 2022-07-22 ENCOUNTER — Encounter (HOSPITAL_COMMUNITY): Payer: Self-pay

## 2022-07-22 ENCOUNTER — Emergency Department (HOSPITAL_COMMUNITY)
Admission: EM | Admit: 2022-07-22 | Discharge: 2022-07-23 | Disposition: A | Discharge status: Home / Self Care | Payer: Managed Care, Other (non HMO) | Attending: Emergency Medicine | Admitting: Emergency Medicine

## 2022-07-22 ENCOUNTER — Inpatient Hospital Stay: Payer: Managed Care, Other (non HMO)

## 2022-07-22 ENCOUNTER — Other Ambulatory Visit: Payer: Self-pay

## 2022-07-22 ENCOUNTER — Emergency Department (HOSPITAL_COMMUNITY): Payer: Managed Care, Other (non HMO)

## 2022-07-22 VITALS — BP 121/78 | HR 90 | Temp 97.3°F | Resp 16

## 2022-07-22 DIAGNOSIS — Z1152 Encounter for screening for COVID-19: Secondary | ICD-10-CM | POA: Insufficient documentation

## 2022-07-22 DIAGNOSIS — E860 Dehydration: Secondary | ICD-10-CM | POA: Insufficient documentation

## 2022-07-22 DIAGNOSIS — R0602 Shortness of breath: Secondary | ICD-10-CM | POA: Insufficient documentation

## 2022-07-22 DIAGNOSIS — C50412 Malignant neoplasm of upper-outer quadrant of left female breast: Secondary | ICD-10-CM

## 2022-07-22 DIAGNOSIS — R197 Diarrhea, unspecified: Secondary | ICD-10-CM | POA: Diagnosis not present

## 2022-07-22 DIAGNOSIS — Z853 Personal history of malignant neoplasm of breast: Secondary | ICD-10-CM | POA: Insufficient documentation

## 2022-07-22 LAB — BASIC METABOLIC PANEL
Anion gap: 11 (ref 5–15)
BUN: 10 mg/dL (ref 6–20)
CO2: 23 mmol/L (ref 22–32)
Calcium: 8.9 mg/dL (ref 8.9–10.3)
Chloride: 101 mmol/L (ref 98–111)
Creatinine, Ser: 0.89 mg/dL (ref 0.44–1.00)
GFR, Estimated: >60 mL/min (ref >60)
Glucose, Bld: 166 mg/dL — ABNORMAL HIGH (ref 70–99)
Potassium: 3.5 mmol/L (ref 3.5–5.1)
Sodium: 135 mmol/L (ref 135–145)

## 2022-07-22 LAB — CBC WITH DIFFERENTIAL/PLATELET
Abs Immature Granulocytes: 0.37 10*3/uL — ABNORMAL HIGH (ref 0.00–0.07)
Basophils Absolute: 0 10*3/uL (ref 0.0–0.1)
Basophils Relative: 0 %
Eosinophils Absolute: 0 10*3/uL (ref 0.0–0.5)
Eosinophils Relative: 0 %
HCT: 34.3 % — ABNORMAL LOW (ref 36.0–46.0)
Hemoglobin: 10.8 g/dL — ABNORMAL LOW (ref 12.0–15.0)
Immature Granulocytes: 2 %
Lymphocytes Relative: 5 %
Lymphs Abs: 0.8 10*3/uL (ref 0.7–4.0)
MCH: 28.1 pg (ref 26.0–34.0)
MCHC: 31.5 g/dL (ref 30.0–36.0)
MCV: 89.1 fL (ref 80.0–100.0)
Monocytes Absolute: 0.2 10*3/uL (ref 0.1–1.0)
Monocytes Relative: 1 %
Neutro Abs: 14.1 10*3/uL — ABNORMAL HIGH (ref 1.7–7.7)
Neutrophils Relative %: 92 %
Platelets: 350 10*3/uL (ref 150–400)
RBC: 3.85 MIL/uL — ABNORMAL LOW (ref 3.87–5.11)
RDW: 13.6 % (ref 11.5–15.5)
WBC: 15.4 10*3/uL — ABNORMAL HIGH (ref 4.0–10.5)
nRBC: 0 % (ref 0.0–0.2)

## 2022-07-22 LAB — TROPONIN I (HIGH SENSITIVITY): Troponin I (High Sensitivity): 4 ng/L (ref <18)

## 2022-07-22 LAB — RESP PANEL BY RT-PCR (RSV, FLU A&B, COVID)  RVPGX2
Influenza A by PCR: NEGATIVE
Influenza B by PCR: NEGATIVE
Resp Syncytial Virus by PCR: NEGATIVE
SARS Coronavirus 2 by RT PCR: NEGATIVE

## 2022-07-22 LAB — HCG, QUANTITATIVE, PREGNANCY: hCG, Beta Chain, Quant, S: <1 m[IU]/mL (ref <5)

## 2022-07-22 LAB — D-DIMER, QUANTITATIVE: D-Dimer, Quant: 1.5 ug/mL-FEU — ABNORMAL HIGH (ref 0.00–0.50)

## 2022-07-22 MED ORDER — IOHEXOL 350 MG/ML SOLN
75.0000 mL | Freq: Once | INTRAVENOUS | Status: AC | PRN
  Administered 2022-07-22: 75 mL via INTRAVENOUS

## 2022-07-22 MED ORDER — LACTATED RINGERS IV BOLUS
1000.0000 mL | Freq: Once | INTRAVENOUS | Status: AC
  Administered 2022-07-22: 1000 mL via INTRAVENOUS

## 2022-07-22 MED ORDER — PEGFILGRASTIM-CBQV 6 MG/0.6ML ~~LOC~~ SOSY
6.0000 mg | PREFILLED_SYRINGE | Freq: Once | SUBCUTANEOUS | Status: AC
  Administered 2022-07-22: 6 mg via SUBCUTANEOUS

## 2022-07-22 NOTE — Patient Instructions (Signed)

## 2022-07-22 NOTE — ED Triage Notes (Signed)
Pt arrived via POV, after CA treatment. Had first round of chemo today, started with SOB approx 20 minutes after. States SOB has resolved but now c/o diarrhea. Hypotensive in triage

## 2022-07-22 NOTE — Discharge Instructions (Signed)
Your CT scan of the chest is negative for PE and your laboratory evaluation was overall reassuring.  He had a mildly elevated white blood cell count which can be explained by your Neulasta injection.  However if you develop signs of infection such as pain while urinating or increased urinary frequency, worsening pain in the abdomen, develop fever or chills, this could be an indication of infection requiring CT imaging to further evaluate.

## 2022-07-22 NOTE — ED Notes (Signed)
IV access obtained. Blue, Lt Green, Dk Nyoka Cowden, and Lav tubes sent to lab.

## 2022-07-22 NOTE — ED Provider Notes (Signed)
[facility]ERG[facility] DEPARTMENT AT [facility] Provider Note   CSN: [id number] Arrival date & time: 07/22/22  1358     History  Chief Complaint  Patient presents with   Shortness of Breath   Diarrhea   Hypotension    STAVROULA NICKEL is a 42 y.o. female.   Shortness of Breath Diarrhea    42 year old female with medical history significant for breast cancer status post biopsy and currently on chemotherapy who presents to the emergency department with shortness of breath.  The patient states that after receiving chemotherapy she has developed mild diarrhea passing small stools.  She presents primarily for shortness of breath, was found earlier today to be hypotensive 20 minutes after initiation of chemotherapy.  She states that her shortness of breath is since resolved.  Initially presented hypotensive which resolved following fluid resuscitation.  She denies any chest pain.  She denies any fever, chills, cough.  She denies any lower extremity swelling.  She denies any history of DVT or PE.  She denies abdominal pain, nausea or vomiting.  Home Medications Prior to Admission medications   Medication Sig Start Date End Date Taking? Authorizing Provider  Ashwagandha 300 MG TABS Take by mouth. Also has selenium, zinc, iodine, L-tyrosine. Named "Thyroid Defend". Patient not taking: Reported on 07/13/2022    [provider]  dexamethasone (DECADRON) 4 MG tablet Take 2 tablets (8 mg total) by mouth daily for 3 days. Start the day after doxorubicin/cyclophosphamide chemotherapy. Take with food. Patient not taking: Reported on 07/13/2022 07/11/22   Benay Pike, MD  FERROCITE 324 MG TABS tablet Take 1 tablet by mouth daily. 12/28/21   [provider]  folic acid (FOLVITE) 1 MG tablet Take 1 mg by mouth daily. 12/18/21   [provider]  ketoconazole (NIZORAL) 2 % shampoo Apply topically. 03/10/22   [provider]  lidocaine-prilocaine (EMLA) cream  Apply to affected area once Patient not taking: Reported on 07/13/2022 07/11/22   Benay Pike, MD  lidocaine-prilocaine (EMLA) cream Apply 1 Application topically as needed. 07/19/22   Autumn Messing III, MD  methimazole (TAPAZOLE) 5 MG tablet Take 15 mg by mouth every morning. 12/10/21   [provider]  Multiple Vitamin (MULTIVITAMIN PO) Take by mouth.    [provider]  omeprazole (PRILOSEC) 40 MG capsule Take 40 mg by mouth daily. 12/21/21   [provider]  ondansetron (ZOFRAN) 8 MG tablet Take 1 tab (8 mg) by mouth every 8 hrs as needed for nausea/vomiting. Start third day after doxorubicin/cyclophosphamide chemotherapy. Patient not taking: Reported on 07/13/2022 07/11/22   Benay Pike, MD  oxyCODONE (ROXICODONE) 5 MG immediate release tablet Take 1 tablet (5 mg total) by mouth every 6 (six) hours as needed for severe pain. 07/19/22   Jovita Kussmaul, MD  PREBIOTIC PRODUCT PO Take by mouth.    [provider]  prochlorperazine (COMPAZINE) 10 MG tablet Take 1 tablet (10 mg total) by mouth every 6 (six) hours as needed for nausea or vomiting. Patient not taking: Reported on 07/13/2022 07/11/22   Benay Pike, MD      Allergies    Adriamycin [doxorubicin]    Review of Systems   Review of Systems  Respiratory:  Positive for shortness of breath.   Gastrointestinal:  Positive for diarrhea.    Physical Exam Updated Vital Signs BP 108/69   Pulse 74   Temp 98 F (36.7 C) (Oral)   Resp 18   LMP 07/08/2022 (Exact Date)  SpO2 100%  Physical Exam Vitals and nursing note reviewed.  Constitutional:      General: She is not in acute distress.    Appearance: She is well-developed.  HENT:     Head: Normocephalic and atraumatic.     Mouth/Throat:     Mouth: Mucous membranes are dry.  Eyes:     Conjunctiva/sclera: Conjunctivae normal.  Cardiovascular:     Rate and Rhythm: Normal rate and regular rhythm.     Heart sounds: No murmur heard. Pulmonary:      Effort: Pulmonary effort is normal. No respiratory distress.     Breath sounds: Normal breath sounds.  Abdominal:     Palpations: Abdomen is soft.     Tenderness: There is no abdominal tenderness.  Musculoskeletal:        General: No swelling.     Cervical back: Neck supple.  Skin:    General: Skin is warm and dry.     Capillary Refill: Capillary refill takes less than 2 seconds.  Neurological:     Mental Status: She is alert.  Psychiatric:        Mood and Affect: Mood normal.     ED Results / Procedures / Treatments   Labs (all labs ordered are listed, but only abnormal results are displayed) Labs Reviewed  D-DIMER, QUANTITATIVE - Abnormal; Notable for the following components:      Result Value   D-Dimer, Quant 1.50 (*)    All other components within normal limits  CBC WITH DIFFERENTIAL/PLATELET - Abnormal; Notable for the following components:   WBC 15.4 (*)    RBC 3.85 (*)    Hemoglobin 10.8 (*)    HCT 34.3 (*)    Neutro Abs 14.1 (*)    Abs Immature Granulocytes 0.37 (*)    All other components within normal limits  BASIC METABOLIC PANEL - Abnormal; Notable for the following components:   Glucose, Bld 166 (*)    All other components within normal limits  RESP PANEL BY RT-PCR (RSV, FLU A&B, COVID)  RVPGX2  HCG, QUANTITATIVE, PREGNANCY  TROPONIN I (HIGH SENSITIVITY)    EKG EKG Interpretation  Date/Time:  Saturday July 22 2022 16:24:03 EST Ventricular Rate:  87 PR Interval:  160 QRS Duration: 85 QT Interval:  367 QTC Calculation: 442 R Axis:   124 Text Interpretation: Right and left arm electrode reversal, interpretation assumes no reversal Sinus or ectopic atrial rhythm Right axis deviation Low voltage, precordial leads Nonspecific T abnormalities, lateral leads copy Confirmed by Aletta Edouard [phone]) on 07/23/2022 10:50:43 AM  Radiology No results found.  Procedures Procedures    Medications Ordered in ED Medications  lactated ringers bolus 1,000  mL (0 mLs Intravenous Stopped 07/22/22 1735)  iohexol (OMNIPAQUE) 350 MG/ML injection 75 mL (75 mLs Intravenous Contrast Given 07/22/22 2033)    ED Course/ Medical Decision Making/ A&P Clinical Course as of 07/24/22 2321  Sat Jul 22, 2022  1631 D-Dimer, Quant(!): 1.50 [JL]  1631 WBC(!): 15.4 [JL]    Clinical Course User Index [JL] Regan Lemming, MD                             Medical Decision Making Amount and/or Complexity of Data Reviewed Labs: ordered. Decision-making details documented in ED Course. Radiology: ordered.  Risk Prescription drug management.     42 year old female with medical history significant for breast cancer status post biopsy and currently on chemotherapy who presents to the  emergency department with shortness of breath.  The patient states that after receiving chemotherapy she has developed mild diarrhea passing small stools.  She presents primarily for shortness of breath, was found earlier today to be hypotensive 20 minutes after initiation of chemotherapy.  She states that her shortness of breath is since resolved.  Initially presented hypotensive which resolved following fluid resuscitation.  She denies any chest pain.  She denies any fever, chills, cough.  She denies any lower extremity swelling.  She denies any history of DVT or PE.  She denies abdominal pain, nausea or vomiting.  On arrival, the patient was afebrile, not tachycardic, initially hypotensive BP 86/47, MAP of 60, saturating 100% on room air.  Differential diagnosis includes hypovolemia, dehydration, infectious etiology, PE.  The patient's EKG on arrival revealed sinus rhythm, ventricular rate 87, no evidence for STEMI.  Initial chest x-ray was unremarkable.  Patient denies any genitourinary symptoms.  She is undergoing treatment with pegfilgrastim.  Her CBC revealed a leukocytosis to 15.4.  This could be due to her pegfilgrastim injection.  On exam, the patient has lungs clear to auscultation  bilaterally, her abdomen is soft, nondistended, nontender.  She has had diarrhea passing small stools but has no abdominal discomfort.  She has some shortness of breath which is since resolved.  Full laboratory evaluation significant for CBC with leukocytosis to 15.4, mild anemia to 10.8, BMP generally unremarkable, D-dimer elevated 1.5, troponins x 2 negative, hCG normal, COVID-19, influenza and RSV PCR testing negative.  CTA PE study ordered: IMPRESSION:  1. Negative examination for pulmonary embolism.  2. Left axillary lymphadenopathy containing biopsy marking clip.  3. Postoperative findings of bilateral breast biopsy and/or  lumpectomy.  4. Multiple low-attenuation liver lesions, the largest of which is  clearly a simple cyst, others too small to characterize. Correlate  with staging imaging of the abdomen in the setting of known breast  cancer.    Follow initial fluid resuscitation, the patient maintained normotension.  She is afebrile she is not tachycardic.  Not meeting SIRS criteria.  She has no infectious symptoms at this time.  She is overall well-appearing and feeling symptomatically improved following fluid resuscitation.  Suspect likely dehydration in the setting of diarrhea following chemotherapy.  Discussed admission for observation for continued rehydration versus discharge home.  The patient would prefer to continue to recover at home and follow-up outpatient with her oncologist.  I think this is reasonable.  Stable for discharge.  DC Instructions: Your CT scan of the chest is negative for PE and your laboratory evaluation was overall reassuring.  He had a mildly elevated white blood cell count which can be explained by your Neulasta injection.  However if you develop signs of infection such as pain while urinating or increased urinary frequency, worsening pain in the abdomen, develop fever or chills, this could be an indication of infection requiring CT imaging to further  evaluate.   Final Clinical Impression(s) / ED Diagnoses Final diagnoses:  Dehydration  Diarrhea, unspecified type  Shortness of breath    Rx / DC Orders ED Discharge Orders     None         Regan Lemming, MD 07/24/22 2321

## 2022-07-24 NOTE — Telephone Encounter (Signed)
[place] paperwork completed by this nurse at this time.  Folder placed in designated mail bin for collaborative pick up for provider review, signature, and return.

## 2022-07-27 ENCOUNTER — Encounter: Payer: Self-pay | Admitting: *Deleted

## 2022-07-31 ENCOUNTER — Other Ambulatory Visit: Payer: Self-pay | Admitting: Hematology and Oncology

## 2022-07-31 ENCOUNTER — Encounter: Payer: Self-pay | Admitting: *Deleted

## 2022-07-31 ENCOUNTER — Encounter: Payer: Self-pay | Admitting: Hematology and Oncology

## 2022-07-31 DIAGNOSIS — C50412 Malignant neoplasm of upper-outer quadrant of left female breast: Secondary | ICD-10-CM

## 2022-07-31 DIAGNOSIS — Z853 Personal history of malignant neoplasm of breast: Secondary | ICD-10-CM

## 2022-08-01 NOTE — Telephone Encounter (Signed)
Today this nurse received [employer] claim [id number] form signed by provider.  Successfully returned to fax number [phone].  Copy to H.I.M. bin designated for record request.  Form in envelope for patient pick-up.  Noted Shawna Orleans claim number [id number] mixed in with above request.  Unable to connect with [facility] for clarification Sof claims at this time.  08/02/2022 '@[id number]'$  Connected with [employer].  Representative confirmed both claim numbers and receipt of form returned yesterday.  Unable to connect with case manager for verification of claim number [id number].  Representative advised this nurse to expect call if case manager needs further information.   Claim [id number] form completed to collaborative pick up bin for provider review, signature and return.

## 2022-08-02 ENCOUNTER — Telehealth: Payer: Self-pay | Admitting: Hematology and Oncology

## 2022-08-02 ENCOUNTER — Encounter: Payer: Self-pay | Admitting: Hematology and Oncology

## 2022-08-02 MED FILL — Dexamethasone Sodium Phosphate Inj 100 MG/10ML: INTRAMUSCULAR | Qty: 1 | Status: AC

## 2022-08-02 MED FILL — Fosaprepitant Dimeglumine For IV Infusion 150 MG (Base Eq): INTRAVENOUS | Qty: 5 | Status: AC

## 2022-08-02 NOTE — Progress Notes (Signed)
Called pt to introduce myself as her Arboriculturist and to discuss the [employer].  Pt would like to apply and since she's not receiving income at this time she will bring a letter of support on 08/03/22.  Once received I will approve her for the grant.

## 2022-08-02 NOTE — Telephone Encounter (Signed)
Spoke with patient confirming upcoming appointments  

## 2022-08-02 NOTE — Telephone Encounter (Addendum)
08/02/2022 This nurse successfully faxed Oncology Treatment Plan Request for Nicole Maddox to [facility] Case Manager, Tanzania Heichel. With medication list, chemotherapy treatment plan and pathology as requested.

## 2022-08-03 ENCOUNTER — Inpatient Hospital Stay: Payer: Managed Care, Other (non HMO)

## 2022-08-03 ENCOUNTER — Encounter: Payer: Self-pay | Admitting: Adult Health

## 2022-08-03 ENCOUNTER — Other Ambulatory Visit: Payer: Managed Care, Other (non HMO)

## 2022-08-03 ENCOUNTER — Inpatient Hospital Stay (HOSPITAL_BASED_OUTPATIENT_CLINIC_OR_DEPARTMENT_OTHER): Payer: Managed Care, Other (non HMO) | Admitting: Adult Health

## 2022-08-03 ENCOUNTER — Encounter: Payer: Self-pay | Admitting: Hematology and Oncology

## 2022-08-03 VITALS — BP 117/63 | HR 85 | Temp 97.9°F | Resp 18 | Ht 64.0 in | Wt 169.0 lb

## 2022-08-03 DIAGNOSIS — C50412 Malignant neoplasm of upper-outer quadrant of left female breast: Secondary | ICD-10-CM

## 2022-08-03 DIAGNOSIS — Z5111 Encounter for antineoplastic chemotherapy: Secondary | ICD-10-CM | POA: Diagnosis not present

## 2022-08-03 DIAGNOSIS — K769 Liver disease, unspecified: Secondary | ICD-10-CM | POA: Diagnosis not present

## 2022-08-03 DIAGNOSIS — Z95828 Presence of other vascular implants and grafts: Secondary | ICD-10-CM

## 2022-08-03 DIAGNOSIS — Z170 Estrogen receptor positive status [ER+]: Secondary | ICD-10-CM

## 2022-08-03 LAB — CMP (CANCER CENTER ONLY)
ALT: 18 U/L (ref 0–44)
AST: 19 U/L (ref 15–41)
Albumin: 3.9 g/dL (ref 3.5–5.0)
Alkaline Phosphatase: 84 U/L (ref 38–126)
Anion gap: 5 (ref 5–15)
BUN: <5 mg/dL — ABNORMAL LOW (ref 6–20)
CO2: 27 mmol/L (ref 22–32)
Calcium: 8.3 mg/dL — ABNORMAL LOW (ref 8.9–10.3)
Chloride: 107 mmol/L (ref 98–111)
Creatinine: 0.59 mg/dL (ref 0.44–1.00)
GFR, Estimated: >60 mL/min (ref >60)
Glucose, Bld: 78 mg/dL (ref 70–99)
Potassium: 4 mmol/L (ref 3.5–5.1)
Sodium: 139 mmol/L (ref 135–145)
Total Bilirubin: 0.2 mg/dL — ABNORMAL LOW (ref 0.3–1.2)
Total Protein: 5.9 g/dL — ABNORMAL LOW (ref 6.5–8.1)

## 2022-08-03 LAB — CBC WITH DIFFERENTIAL (CANCER CENTER ONLY)
Abs Immature Granulocytes: 1.52 10*3/uL — ABNORMAL HIGH (ref 0.00–0.07)
Basophils Absolute: 0.1 10*3/uL (ref 0.0–0.1)
Basophils Relative: 1 %
Eosinophils Absolute: 0.1 10*3/uL (ref 0.0–0.5)
Eosinophils Relative: 1 %
HCT: 27.5 % — ABNORMAL LOW (ref 36.0–46.0)
Hemoglobin: 9.2 g/dL — ABNORMAL LOW (ref 12.0–15.0)
Immature Granulocytes: 12 %
Lymphocytes Relative: 19 %
Lymphs Abs: 2.5 10*3/uL (ref 0.7–4.0)
MCH: 28.9 pg (ref 26.0–34.0)
MCHC: 33.5 g/dL (ref 30.0–36.0)
MCV: 86.5 fL (ref 80.0–100.0)
Monocytes Absolute: 1.1 10*3/uL — ABNORMAL HIGH (ref 0.1–1.0)
Monocytes Relative: 9 %
Neutro Abs: 7.7 10*3/uL (ref 1.7–7.7)
Neutrophils Relative %: 58 %
Platelet Count: 172 10*3/uL (ref 150–400)
RBC: 3.18 MIL/uL — ABNORMAL LOW (ref 3.87–5.11)
RDW: 14.9 % (ref 11.5–15.5)
Smear Review: NORMAL
WBC Count: 13 10*3/uL — ABNORMAL HIGH (ref 4.0–10.5)
nRBC: 1 % — ABNORMAL HIGH (ref 0.0–0.2)

## 2022-08-03 MED ORDER — AMOXICILLIN-POT CLAVULANATE 875-125 MG PO TABS: 1.0000 | ORAL_TABLET | Freq: Two times a day (BID) | ORAL | 0 refills | Status: DC

## 2022-08-03 MED ORDER — SODIUM CHLORIDE 0.9 % IV SOLN: Freq: Once | INTRAVENOUS | Status: AC

## 2022-08-03 MED ORDER — HEPARIN SOD (PORK) LOCK FLUSH 100 UNIT/ML IV SOLN
500.0000 [IU] | Freq: Once | INTRAVENOUS | Status: AC | PRN
  Administered 2022-08-03: 500 [IU]

## 2022-08-03 MED ORDER — SODIUM CHLORIDE 0.9 % IV SOLN
150.0000 mg | Freq: Once | INTRAVENOUS | Status: AC
  Administered 2022-08-03: 150 mg via INTRAVENOUS
  Filled 2022-08-03: qty 150

## 2022-08-03 MED ORDER — DOXORUBICIN HCL CHEMO IV INJECTION 2 MG/ML
60.0000 mg/m2 | Freq: Once | INTRAVENOUS | Status: AC
  Administered 2022-08-03: 110 mg via INTRAVENOUS
  Filled 2022-08-03: qty 55

## 2022-08-03 MED ORDER — SODIUM CHLORIDE 0.9 % IV SOLN
600.0000 mg/m2 | Freq: Once | INTRAVENOUS | Status: AC
  Administered 2022-08-03: 1100 mg via INTRAVENOUS
  Filled 2022-08-03: qty 55

## 2022-08-03 MED ORDER — SODIUM CHLORIDE 0.9% FLUSH
10.0000 mL | INTRAVENOUS | Status: DC | PRN
  Administered 2022-08-03: 10 mL

## 2022-08-03 MED ORDER — SODIUM CHLORIDE 0.9% FLUSH
10.0000 mL | Freq: Once | INTRAVENOUS | Status: AC
  Administered 2022-08-03: 10 mL

## 2022-08-03 MED ORDER — PALONOSETRON HCL INJECTION 0.25 MG/5ML
0.2500 mg | Freq: Once | INTRAVENOUS | Status: AC
  Administered 2022-08-03: 0.25 mg via INTRAVENOUS
  Filled 2022-08-03: qty 5

## 2022-08-03 MED ORDER — LORAZEPAM 2 MG/ML IJ SOLN
0.5000 mg | Freq: Once | INTRAMUSCULAR | Status: AC
  Administered 2022-08-03: 0.5 mg via INTRAVENOUS
  Filled 2022-08-03: qty 1

## 2022-08-03 MED ORDER — SODIUM CHLORIDE 0.9 % IV SOLN
10.0000 mg | Freq: Once | INTRAVENOUS | Status: AC
  Administered 2022-08-03: 10 mg via INTRAVENOUS
  Filled 2022-08-03: qty 10

## 2022-08-03 NOTE — Progress Notes (Signed)
Pt is approved for the $1000 [employer] grant.  

## 2022-08-03 NOTE — Progress Notes (Signed)
[facility] Follow up:    Nicole Bald, MD [address] [place] [address]   DIAGNOSIS:  Cancer Staging  Malignant neoplasm of upper-outer quadrant of left female breast [facility]) Staging form: Breast, AJCC 8th Edition - Clinical stage from 06/28/2022: Stage IB (cT1c, cN0(f), cM0, G3, ER+, PR-, HER2-) - Signed by Nicole Pedro, PA-C on 06/28/2022 Stage prefix: Initial diagnosis Method of lymph node assessment: Core biopsy Histologic grading system: 3 grade system   SUMMARY OF ONCOLOGIC HISTORY: Oncology History  Malignant neoplasm of upper-outer quadrant of left female breast ([place])  06/28/2022 Initial Diagnosis   Primary malignant neoplasm of upper inner quadrant of left female breast ([place])   06/28/2022 Cancer Staging   Staging form: Breast, AJCC 8th Edition - Clinical stage from 06/28/2022: Stage IB (cT1c, cN0(f), cM0, G3, ER+, PR-, HER2-) - Signed by Nicole Pedro, PA-C on 06/28/2022 Stage prefix: Initial diagnosis Method of lymph node assessment: Core biopsy Histologic grading system: 3 grade system   07/11/2022 Genetic Testing   Negative genetic testing on the 9 gene STAT panel.  The report date is July 05, 2022. Negative genetic testing on the Multi-cancer panel.  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  BLM c.3136G>A (p.Gly1046Ser), EGFR c.61G>A (p.Ala21Thr) and PDGFRA c.50G>T (p.Gly17Val) VUS identified.  The report date is July 11, 2022.  The STAT Breast cancer panel offered by [facility] includes sequencing and rearrangement analysis for the following 9 genes:  ATM, BRCA1, BRCA2, CDH1, CHEK2, PALB2, PTEN, STK11 and TP53.   The Multi-Cancer + RNA Panel offered by [facility] includes sequencing and/or deletion/duplication analysis of the following 70 genes:  AIP*, ALK, APC*, ATM*, AXIN2*, BAP1*, BARD1*, BLM*, BMPR1A*, BRCA1*, BRCA2*, BRIP1*, CDC73*, CDH1*, CDK4, CDKN1B*, CDKN2A, CHEK2*, CTNNA1*, DICER1*, EPCAM (del/dup only), EGFR, FH*, FLCN*, GREM1  (promoter dup only), HOXB13, KIT, LZTR1, MAX*, MBD4, MEN1*, MET, MITF, MLH1*, MSH2*, MSH3*, MSH6*, MUTYH*, NF1*, NF2*, NTHL1*, PALB2*, PDGFRA, PMS2*, POLD1*, POLE*, POT1*, PRKAR1A*, PTCH1*, PTEN*, RAD51C*, RAD51D*, RB1*, RET, SDHA* (sequencing only), SDHAF2*, SDHB*, SDHC*, SDHD*, SMAD4*, SMARCA4*, SMARCB1*, SMARCE1*, STK11*, SUFU*, TMEM127*, TP53*, TSC1*, TSC2*, VHL*. RNA analysis is performed for * genes.    07/20/2022 -  Chemotherapy   Patient is on Treatment Plan : BREAST ADJUVANT DOSE DENSE AC q14d / PACLitaxel q7d       CURRENT THERAPY: Neoadjuvant Adriamycin and Cytoxan  INTERVAL HISTORY: Nicole Maddox 42 y.o. female returns for follow-up and evaluation prior to receiving cycle 2 of Adriamycin and Cytoxan.  She is doing well today.  She did have an ER visit on shot day which was 2 days after she received Adriamycin and Cytoxan.  She felt dehydrated and said that the fluids helped on the stay very much that she received while in the ER.  She also was complaining of some right labial discomfort and wants me to take a look to see what could be going on.  She has had no fever, chills, or any other concerns.   Patient Active Problem List   Diagnosis Date Noted   Port-A-Cath in place 07/20/2022   Genetic testing 07/07/2022   Family history of stomach cancer 06/29/2022   Malignant neoplasm of upper-outer quadrant of left female breast ([place]) 06/27/2022   Seborrheic dermatitis of scalp 02/25/2018    is allergic to adriamycin [doxorubicin].  MEDICAL HISTORY: Past Medical History:  Diagnosis Date   Breast cancer ([place]) 06/23/2022   Family history of stomach cancer    GERD (gastroesophageal reflux disease)    Hyperthyroidism  Thyroid disease     SURGICAL HISTORY: Past Surgical History:  Procedure Laterality Date   BREAST BIOPSY Left 06/23/2022   [place] LT BREAST BX W LOC DEV 1ST LESION IMG BX [place] US GUIDE 06/23/2022 [facility] MAMMOGRAPHY   PORTACATH PLACEMENT Right 07/19/2022    Procedure: INSERTION PORT-A-CATH;  Surgeon: Jovita Kussmaul, MD;  Location: [place];  Service: General;  Laterality: Right;  50 MIN ROOM 8   WISDOM TOOTH EXTRACTION      SOCIAL HISTORY: Social History   Socioeconomic History   Marital status: Single    Spouse name: Not on file   Number of children: Not on file   Years of education: Not on file   Highest education level: Not on file  Occupational History   Not on file  Tobacco Use   Smoking status: Never   Smokeless tobacco: Never  Vaping Use   Vaping Use: Never used  Substance and Sexual Activity   Alcohol use: Yes    Comment: socially   Drug use: No   Sexual activity: Yes    Partners: Male    Birth control/protection: None    Comment: intercourse age 45, more than 6 sexual parters,   Other Topics Concern   Not on file  Social History Narrative   Not on file   Social Determinants of Health   Financial Resource Strain: Low Risk  (07/04/2022)   Overall Financial Resource Strain (CARDIA)    Difficulty of Paying Living Expenses: Not very hard  Food Insecurity: No Food Insecurity (06/28/2022)   Hunger Vital Sign    Worried About Running Out of Food in the Last Year: Never true    Ran Out of Food in the Last Year: Never true  Transportation Needs: No Transportation Needs (06/28/2022)   PRAPARE - Hydrologist (Medical): No    Lack of Transportation (Non-Medical): No  Physical Activity: Not on file  Stress: Not on file  Social Connections: Not on file  Intimate Partner Violence: Not At Risk (06/28/2022)   Humiliation, Afraid, Rape, and Kick questionnaire    Fear of Current or Ex-Partner: No    Emotionally Abused: No    Physically Abused: No    Sexually Abused: No    FAMILY HISTORY: Family History  Problem Relation Age of Onset   Heart attack Father 9   Stomach cancer Maternal Aunt        dx > 50   Throat cancer Maternal Uncle        dx 10s   Stomach cancer Maternal  Uncle        dx > 50   Heart Problems Maternal Grandmother    Breast cancer Cousin        mother's maternal first cousin    Review of Systems  Constitutional:  Positive for fatigue (Mild). Negative for appetite change, chills, fever and unexpected weight change.  HENT:   Negative for hearing loss, lump/mass and trouble swallowing.   Eyes:  Negative for eye problems and icterus.  Respiratory:  Negative for chest tightness, cough and shortness of breath.   Cardiovascular:  Negative for chest pain, leg swelling and palpitations.  Gastrointestinal:  Negative for abdominal distention, abdominal pain, constipation, diarrhea, nausea and vomiting.  Endocrine: Negative for hot flashes.  Genitourinary:  Negative for difficulty urinating.   Musculoskeletal:  Negative for arthralgias.  Skin:  Negative for itching and rash.  Neurological:  Negative for dizziness, extremity weakness, headaches and numbness.  Hematological:  Negative for adenopathy. Does not bruise/bleed easily.  Psychiatric/Behavioral:  Negative for depression. The patient is not nervous/anxious.       PHYSICAL EXAMINATION  ECOG PERFORMANCE STATUS: 1 - Symptomatic but completely ambulatory  Vitals:   08/03/22 1214  BP: 117/63  Pulse: 85  Resp: 18  Temp: 97.9 F (36.6 C)  SpO2: 100%    Physical Exam Constitutional:      General: She is not in acute distress.    Appearance: Normal appearance. She is not toxic-appearing.  HENT:     Head: Normocephalic and atraumatic.  Eyes:     General: No scleral icterus. Cardiovascular:     Rate and Rhythm: Normal rate and regular rhythm.     Pulses: Normal pulses.     Heart sounds: Normal heart sounds.  Pulmonary:     Effort: Pulmonary effort is normal.     Breath sounds: Normal breath sounds.  Abdominal:     General: Abdomen is flat. Bowel sounds are normal. There is no distension.     Palpations: Abdomen is soft.     Tenderness: There is no abdominal tenderness.   Genitourinary:      Comments: Hard fluidlike collection noted in the right labia.  There is no surrounding erythema, there is no drainage, there is slight tenderness to palpation. Musculoskeletal:        General: No swelling.     Cervical back: Neck supple.  Lymphadenopathy:     Cervical: No cervical adenopathy.  Skin:    General: Skin is warm and dry.     Findings: No rash.  Neurological:     General: No focal deficit present.     Mental Status: She is alert.  Psychiatric:        Mood and Affect: Mood normal.        Behavior: Behavior normal.     LABORATORY DATA:  CBC    Component Value Date/Time   WBC 13.0 (H) 08/03/2022 1150   WBC 15.4 (H) 07/22/2022 1535   RBC 3.18 (L) 08/03/2022 1150   HGB 9.2 (L) 08/03/2022 1150   HCT 27.5 (L) 08/03/2022 1150   PLT 172 08/03/2022 1150   MCV 86.5 08/03/2022 1150   MCH 28.9 08/03/2022 1150   MCHC 33.5 08/03/2022 1150   RDW 14.9 08/03/2022 1150   LYMPHSABS 2.5 08/03/2022 1150   MONOABS 1.1 (H) 08/03/2022 1150   EOSABS 0.1 08/03/2022 1150   BASOSABS 0.1 08/03/2022 1150    CMP     Component Value Date/Time   NA 139 08/03/2022 1150   K 4.0 08/03/2022 1150   CL 107 08/03/2022 1150   CO2 27 08/03/2022 1150   GLUCOSE 78 08/03/2022 1150   BUN <5 (L) 08/03/2022 1150   CREATININE 0.59 08/03/2022 1150   CALCIUM 8.3 (L) 08/03/2022 1150   PROT 5.9 (L) 08/03/2022 1150   ALBUMIN 3.9 08/03/2022 1150   AST 19 08/03/2022 1150   ALT 18 08/03/2022 1150   ALKPHOS 84 08/03/2022 1150   BILITOT 0.2 (L) 08/03/2022 1150   GFRNONAA >60 08/03/2022 1150      ASSESSMENT and THERAPY PLAN:   Malignant neoplasm of upper-outer quadrant of left female breast ([place]) Nicole Maddox is a 42 year old woman with stage Ib estrogen positive breast cancer diagnosed in January 2024.  She is here today for labs and follow-up prior to receiving her second cycle of Adriamycin and Cytoxan.  She did have a lot of achiness when she received the doxorubicin with her  first cycle.  She tells me that she is very anxious about receiving it today.  Due to this I have added lorazepam IV 0.5 mg prior to her infusion so hopefully she will not be so nervous.Her labs are stable and she will proceed with her treatment today.    She will return on day 3 for an injection however to prevent another ER visit I have placed orders for IV fluids that she can receive on day 3.  Let her know that on that day if she feels like she does not need them she certainly does not have to receive them.  The area in her right labia is consistent with a cyst.  I did give her some antibiotics and suggest warm compresses in case it gets irritated and as her immune system goes down with the chemotherapy to prevent infection.  If it worsens at all or if it is not improved she will call her gynecologist promptly for follow-up.  Asked that I print out her pathology results and discuss and review her staging with her.  I did this and we reviewed her pathology results, the size of her cancer and the staging in detail.  She will return in 2 weeks for labs, follow-up, and her next injection and we will see her in 2 days as noted above for IV fluids and her growth factor injection.   All questions were answered. The patient knows to call the clinic with any problems, questions or concerns. We can certainly see the patient much sooner if necessary.  Total encounter time:45 minutes*in face-to-face visit time, chart review, lab review, care coordination, order entry, and documentation of the encounter time.    Wilber Bihari, NP 08/03/22 3:14 PM Medical Oncology and Hematology [facility] [place], [place] [address] Tel. [phone]    Fax. [phone]  *Total Encounter Time as defined by the Centers for Medicare and Medicaid Services includes, in addition to the face-to-face time of a patient visit (documented in the note above) non-face-to-face time: obtaining and  reviewing outside history, ordering and reviewing medications, tests or procedures, care coordination (communications with other health care professionals or caregivers) and documentation in the medical record.

## 2022-08-03 NOTE — Assessment & Plan Note (Signed)
Nicole Maddox is a 42 year old woman with stage Ib estrogen positive breast cancer diagnosed in January 2024.  She is here today for labs and follow-up prior to receiving her second cycle of Adriamycin and Cytoxan.  She did have a lot of achiness when she received the doxorubicin with her first cycle.  She tells me that she is very anxious about receiving it today.  Due to this I have added lorazepam IV 0.5 mg prior to her infusion so hopefully she will not be so nervous.Her labs are stable and she will proceed with her treatment today.    She will return on day 3 for an injection however to prevent another ER visit I have placed orders for IV fluids that she can receive on day 3.  Let her know that on that day if she feels like she does not need them she certainly does not have to receive them.  The area in her right labia is consistent with a cyst.  I did give her some antibiotics and suggest warm compresses in case it gets irritated and as her immune system goes down with the chemotherapy to prevent infection.  If it worsens at all or if it is not improved she will call her gynecologist promptly for follow-up.  Asked that I print out her pathology results and discuss and review her staging with her.  I did this and we reviewed her pathology results, the size of her cancer and the staging in detail.  She will return in 2 weeks for labs, follow-up, and her next injection and we will see her in 2 days as noted above for IV fluids and her growth factor injection.

## 2022-08-03 NOTE — Patient Instructions (Signed)
[facility]  Discharge Instructions: Thank you for choosing [facility] to provide your oncology and hematology care.   If you have a lab appointment with the [facility], please go directly to the [facility] and check in at the registration area.   Wear comfortable clothing and clothing appropriate for easy access to any Portacath or PICC line.   We strive to give you quality time with your provider. You may need to reschedule your appointment if you arrive late (15 or more minutes).  Arriving late affects you and other patients whose appointments are after yours.  Also, if you miss three or more appointments without notifying the office, you may be dismissed from the clinic at the provider's discretion.      For prescription refill requests, have your pharmacy contact our office and allow 72 hours for refills to be completed.    Today you received the following chemotherapy and/or immunotherapy agents: Doxorubicin, Cytoxan.       To help prevent nausea and vomiting after your treatment, we encourage you to take your nausea medication as directed.  BELOW ARE SYMPTOMS THAT SHOULD BE REPORTED IMMEDIATELY: *FEVER GREATER THAN 100.4 F (38 C) OR HIGHER *CHILLS OR SWEATING *NAUSEA AND VOMITING THAT IS NOT CONTROLLED WITH YOUR NAUSEA MEDICATION *UNUSUAL SHORTNESS OF BREATH *UNUSUAL BRUISING OR BLEEDING *URINARY PROBLEMS (pain or burning when urinating, or frequent urination) *BOWEL PROBLEMS (unusual diarrhea, constipation, pain near the anus) TENDERNESS IN MOUTH AND THROAT WITH OR WITHOUT PRESENCE OF ULCERS (sore throat, sores in mouth, or a toothache) UNUSUAL RASH, SWELLING OR PAIN  UNUSUAL VAGINAL DISCHARGE OR ITCHING   Items with * indicate a potential emergency and should be followed up as soon as possible or go to the Emergency Department if any problems should occur.  Please show the CHEMOTHERAPY ALERT CARD or IMMUNOTHERAPY ALERT  CARD at check-in to the Emergency Department and triage nurse.  Should you have questions after your visit or need to cancel or reschedule your appointment, please contact [facility]  Dept: [phone]  and follow the prompts.  Office hours are 8:00 a.m. to 4:30 p.m. Monday - Friday. Please note that voicemails left after 4:00 p.m. may not be returned until the following business day.  We are closed weekends and major holidays. You have access to a nurse at all times for urgent questions. Please call the main number to the clinic Dept: [phone] and follow the prompts.   For any non-urgent questions, you may also contact your provider using MyChart. We now offer e-Visits for anyone 57 and older to request care online for non-urgent symptoms. For details visit [email or web address].   Also download the MyChart app! Go to the app store, search "MyChart", open the app, select [facility], and log in with your MyChart username and password.

## 2022-08-05 ENCOUNTER — Inpatient Hospital Stay: Payer: Managed Care, Other (non HMO)

## 2022-08-05 ENCOUNTER — Inpatient Hospital Stay: Payer: Managed Care, Other (non HMO) | Attending: Hematology and Oncology

## 2022-08-05 VITALS — BP 116/67 | HR 98 | Temp 98.2°F | Resp 17

## 2022-08-05 DIAGNOSIS — Z5189 Encounter for other specified aftercare: Secondary | ICD-10-CM | POA: Diagnosis not present

## 2022-08-05 DIAGNOSIS — E860 Dehydration: Secondary | ICD-10-CM | POA: Insufficient documentation

## 2022-08-05 DIAGNOSIS — Z5111 Encounter for antineoplastic chemotherapy: Secondary | ICD-10-CM | POA: Diagnosis present

## 2022-08-05 DIAGNOSIS — R5383 Other fatigue: Secondary | ICD-10-CM | POA: Diagnosis not present

## 2022-08-05 DIAGNOSIS — Z170 Estrogen receptor positive status [ER+]: Secondary | ICD-10-CM | POA: Insufficient documentation

## 2022-08-05 DIAGNOSIS — C50412 Malignant neoplasm of upper-outer quadrant of left female breast: Secondary | ICD-10-CM | POA: Diagnosis present

## 2022-08-05 MED ORDER — PEGFILGRASTIM-CBQV 6 MG/0.6ML ~~LOC~~ SOSY
6.0000 mg | PREFILLED_SYRINGE | Freq: Once | SUBCUTANEOUS | Status: AC
  Administered 2022-08-05: 6 mg via SUBCUTANEOUS

## 2022-08-05 MED ORDER — SODIUM CHLORIDE 0.9 % IV SOLN: Freq: Once | INTRAVENOUS | Status: AC

## 2022-08-05 NOTE — Patient Instructions (Signed)

## 2022-08-07 ENCOUNTER — Ambulatory Visit
Admission: RE | Admit: 2022-08-07 | Discharge: 2022-08-07 | Disposition: A | Discharge status: Home / Self Care | Payer: Managed Care, Other (non HMO) | Source: Ambulatory Visit | Attending: Hematology and Oncology

## 2022-08-07 ENCOUNTER — Other Ambulatory Visit (HOSPITAL_COMMUNITY): Payer: Self-pay | Admitting: Diagnostic Radiology

## 2022-08-07 ENCOUNTER — Ambulatory Visit
Admission: RE | Admit: 2022-08-07 | Discharge: 2022-08-07 | Disposition: A | Discharge status: Home / Self Care | Payer: Managed Care, Other (non HMO) | Source: Ambulatory Visit | Attending: Hematology and Oncology | Admitting: Hematology and Oncology

## 2022-08-07 DIAGNOSIS — Z853 Personal history of malignant neoplasm of breast: Secondary | ICD-10-CM

## 2022-08-07 DIAGNOSIS — C50412 Malignant neoplasm of upper-outer quadrant of left female breast: Secondary | ICD-10-CM

## 2022-08-07 MED ORDER — GADOPICLENOL 0.5 MMOL/ML IV SOLN
7.5000 mL | Freq: Once | INTRAVENOUS | Status: AC | PRN
  Administered 2022-08-07: 7.5 mL via INTRAVENOUS

## 2022-08-08 NOTE — Telephone Encounter (Signed)
Returned [employer] paperwork received today by this nurse from collaborative signed by provider.  Form to H.I.M. bin designated for items to be scanned.  Copy to mail bin addresses to address on file. [place] [phone] No further instructions received, actions required or performed by this nurse.

## 2022-08-15 ENCOUNTER — Encounter: Payer: Self-pay | Admitting: *Deleted

## 2022-08-15 ENCOUNTER — Encounter: Payer: Managed Care, Other (non HMO) | Admitting: Genetic Counselor

## 2022-08-15 ENCOUNTER — Ambulatory Visit (HOSPITAL_COMMUNITY)
Admission: RE | Admit: 2022-08-15 | Discharge: 2022-08-15 | Disposition: A | Discharge status: Home / Self Care | Payer: Managed Care, Other (non HMO) | Source: Ambulatory Visit | Attending: Adult Health | Admitting: Adult Health

## 2022-08-15 ENCOUNTER — Other Ambulatory Visit: Payer: Managed Care, Other (non HMO)

## 2022-08-15 DIAGNOSIS — K769 Liver disease, unspecified: Secondary | ICD-10-CM | POA: Diagnosis present

## 2022-08-15 DIAGNOSIS — C50412 Malignant neoplasm of upper-outer quadrant of left female breast: Secondary | ICD-10-CM

## 2022-08-15 MED ORDER — SODIUM CHLORIDE (PF) 0.9 % IJ SOLN
INTRAMUSCULAR | Status: AC
  Filled 2022-08-15: qty 50

## 2022-08-15 MED ORDER — IOHEXOL 300 MG/ML  SOLN
100.0000 mL | Freq: Once | INTRAMUSCULAR | Status: AC | PRN
  Administered 2022-08-15: 100 mL via INTRAVENOUS

## 2022-08-16 ENCOUNTER — Telehealth: Payer: Self-pay

## 2022-08-16 MED FILL — Dexamethasone Sodium Phosphate Inj 100 MG/10ML: INTRAMUSCULAR | Qty: 1 | Status: AC

## 2022-08-16 MED FILL — Fosaprepitant Dimeglumine For IV Infusion 150 MG (Base Eq): INTRAVENOUS | Qty: 5 | Status: AC

## 2022-08-16 NOTE — Telephone Encounter (Signed)
Called pt to give results per NP. She verbalized thanks and understanding.

## 2022-08-16 NOTE — Telephone Encounter (Signed)
-----   Message from Gardenia Phlegm, NP sent at 08/16/2022 11:55 AM EDT ----- CT scan negative for malignancy, shows cysts on liver, no sign of cancer please let patient know the good news  ----- Message ----- From: Interface, Rad Results In Sent: 08/16/2022  11:36 AM EDT To: Gardenia Phlegm, NP

## 2022-08-17 ENCOUNTER — Inpatient Hospital Stay: Payer: Managed Care, Other (non HMO)

## 2022-08-17 ENCOUNTER — Telehealth: Payer: Self-pay | Admitting: Adult Health

## 2022-08-17 ENCOUNTER — Encounter: Payer: Self-pay | Admitting: Hematology and Oncology

## 2022-08-17 ENCOUNTER — Inpatient Hospital Stay: Payer: Managed Care, Other (non HMO) | Admitting: Adult Health

## 2022-08-17 ENCOUNTER — Other Ambulatory Visit: Payer: Self-pay | Admitting: Hematology and Oncology

## 2022-08-17 ENCOUNTER — Encounter: Payer: Self-pay | Admitting: Adult Health

## 2022-08-17 VITALS — BP 109/64 | HR 78 | Resp 18

## 2022-08-17 VITALS — BP 119/74 | HR 86 | Temp 97.0°F | Resp 20 | Wt 168.7 lb

## 2022-08-17 DIAGNOSIS — C50412 Malignant neoplasm of upper-outer quadrant of left female breast: Secondary | ICD-10-CM | POA: Diagnosis not present

## 2022-08-17 DIAGNOSIS — Z95828 Presence of other vascular implants and grafts: Secondary | ICD-10-CM

## 2022-08-17 DIAGNOSIS — Z5111 Encounter for antineoplastic chemotherapy: Secondary | ICD-10-CM | POA: Diagnosis not present

## 2022-08-17 LAB — CBC WITH DIFFERENTIAL (CANCER CENTER ONLY)
Abs Immature Granulocytes: 1.41 10*3/uL — ABNORMAL HIGH (ref 0.00–0.07)
Basophils Absolute: 0.1 10*3/uL (ref 0.0–0.1)
Basophils Relative: 1 %
Eosinophils Absolute: 0.1 10*3/uL (ref 0.0–0.5)
Eosinophils Relative: 1 %
HCT: 29.2 % — ABNORMAL LOW (ref 36.0–46.0)
Hemoglobin: 9.3 g/dL — ABNORMAL LOW (ref 12.0–15.0)
Immature Granulocytes: 13 %
Lymphocytes Relative: 20 %
Lymphs Abs: 2.1 10*3/uL (ref 0.7–4.0)
MCH: 28.1 pg (ref 26.0–34.0)
MCHC: 31.8 g/dL (ref 30.0–36.0)
MCV: 88.2 fL (ref 80.0–100.0)
Monocytes Absolute: 0.7 10*3/uL (ref 0.1–1.0)
Monocytes Relative: 7 %
Neutro Abs: 6.1 10*3/uL (ref 1.7–7.7)
Neutrophils Relative %: 58 %
Platelet Count: 243 10*3/uL (ref 150–400)
RBC: 3.31 MIL/uL — ABNORMAL LOW (ref 3.87–5.11)
RDW: 17 % — ABNORMAL HIGH (ref 11.5–15.5)
WBC Count: 10.5 10*3/uL (ref 4.0–10.5)
nRBC: 1.1 % — ABNORMAL HIGH (ref 0.0–0.2)

## 2022-08-17 LAB — CMP (CANCER CENTER ONLY)
ALT: 18 U/L (ref 0–44)
AST: 22 U/L (ref 15–41)
Albumin: 4 g/dL (ref 3.5–5.0)
Alkaline Phosphatase: 108 U/L (ref 38–126)
Anion gap: 7 (ref 5–15)
BUN: 8 mg/dL (ref 6–20)
CO2: 26 mmol/L (ref 22–32)
Calcium: 8.5 mg/dL — ABNORMAL LOW (ref 8.9–10.3)
Chloride: 108 mmol/L (ref 98–111)
Creatinine: 0.7 mg/dL (ref 0.44–1.00)
GFR, Estimated: >60 mL/min (ref >60)
Glucose, Bld: 95 mg/dL (ref 70–99)
Potassium: 3.5 mmol/L (ref 3.5–5.1)
Sodium: 141 mmol/L (ref 135–145)
Total Bilirubin: 0.2 mg/dL — ABNORMAL LOW (ref 0.3–1.2)
Total Protein: 6.4 g/dL — ABNORMAL LOW (ref 6.5–8.1)

## 2022-08-17 MED ORDER — SODIUM CHLORIDE 0.9 % IV SOLN
150.0000 mg | Freq: Once | INTRAVENOUS | Status: AC
  Administered 2022-08-17: 150 mg via INTRAVENOUS
  Filled 2022-08-17: qty 150

## 2022-08-17 MED ORDER — DOXORUBICIN HCL CHEMO IV INJECTION 2 MG/ML
60.0000 mg/m2 | Freq: Once | INTRAVENOUS | Status: AC
  Administered 2022-08-17: 110 mg via INTRAVENOUS
  Filled 2022-08-17: qty 55

## 2022-08-17 MED ORDER — SODIUM CHLORIDE 0.9% FLUSH
10.0000 mL | Freq: Once | INTRAVENOUS | Status: AC
  Administered 2022-08-17: 10 mL

## 2022-08-17 MED ORDER — SODIUM CHLORIDE 0.9 % IV SOLN
600.0000 mg/m2 | Freq: Once | INTRAVENOUS | Status: AC
  Administered 2022-08-17: 1100 mg via INTRAVENOUS
  Filled 2022-08-17: qty 55

## 2022-08-17 MED ORDER — SODIUM CHLORIDE 0.9 % IV SOLN: Freq: Once | INTRAVENOUS | Status: AC

## 2022-08-17 MED ORDER — LORAZEPAM 2 MG/ML IJ SOLN
0.5000 mg | Freq: Once | INTRAMUSCULAR | Status: AC
  Administered 2022-08-17: 0.5 mg via INTRAVENOUS
  Filled 2022-08-17: qty 1

## 2022-08-17 MED ORDER — HEPARIN SOD (PORK) LOCK FLUSH 100 UNIT/ML IV SOLN
500.0000 [IU] | Freq: Once | INTRAVENOUS | Status: AC | PRN
  Administered 2022-08-17: 500 [IU]

## 2022-08-17 MED ORDER — SODIUM CHLORIDE 0.9% FLUSH
10.0000 mL | INTRAVENOUS | Status: DC | PRN
  Administered 2022-08-17: 10 mL

## 2022-08-17 MED ORDER — PALONOSETRON HCL INJECTION 0.25 MG/5ML
0.2500 mg | Freq: Once | INTRAVENOUS | Status: AC
  Administered 2022-08-17: 0.25 mg via INTRAVENOUS
  Filled 2022-08-17: qty 5

## 2022-08-17 MED ORDER — SODIUM CHLORIDE 0.9 % IV SOLN
10.0000 mg | Freq: Once | INTRAVENOUS | Status: AC
  Administered 2022-08-17: 10 mg via INTRAVENOUS
  Filled 2022-08-17: qty 10

## 2022-08-17 NOTE — Patient Instructions (Signed)

## 2022-08-17 NOTE — Patient Instructions (Addendum)
[facility]  Discharge Instructions: Thank you for choosing [facility] to provide your oncology and hematology care.   If you have a lab appointment with the Cancer Center, please go directly to the Cancer Center and check in at the registration area.   Wear comfortable clothing and clothing appropriate for easy access to any Portacath or PICC line.   We strive to give you quality time with your provider. You may need to reschedule your appointment if you arrive late (15 or more minutes).  Arriving late affects you and other patients whose appointments are after yours.  Also, if you miss three or more appointments without notifying the office, you may be dismissed from the clinic at the provider's discretion.      For prescription refill requests, have your pharmacy contact our office and allow 72 hours for refills to be completed.    Today you received the following chemotherapy and/or immunotherapy agents: Doxorubicin, Cytoxan.       To help prevent nausea and vomiting after your treatment, we encourage you to take your nausea medication as directed.  BELOW ARE SYMPTOMS THAT SHOULD BE REPORTED IMMEDIATELY: *FEVER GREATER THAN 100.4 F (38 C) OR HIGHER *CHILLS OR SWEATING *NAUSEA AND VOMITING THAT IS NOT CONTROLLED WITH YOUR NAUSEA MEDICATION *UNUSUAL SHORTNESS OF BREATH *UNUSUAL BRUISING OR BLEEDING *URINARY PROBLEMS (pain or burning when urinating, or frequent urination) *BOWEL PROBLEMS (unusual diarrhea, constipation, pain near the anus) TENDERNESS IN MOUTH AND THROAT WITH OR WITHOUT PRESENCE OF ULCERS (sore throat, sores in mouth, or a toothache) UNUSUAL RASH, SWELLING OR PAIN  UNUSUAL VAGINAL DISCHARGE OR ITCHING   Items with * indicate a potential emergency and should be followed up as soon as possible or go to the Emergency Department if any problems should occur.  Please show the CHEMOTHERAPY ALERT CARD or IMMUNOTHERAPY ALERT  CARD at check-in to the Emergency Department and triage nurse.  Should you have questions after your visit or need to cancel or reschedule your appointment, please contact [facility]  Dept: [phone]  and follow the prompts.  Office hours are 8:00 a.m. to 4:30 p.m. Monday - Friday. Please note that voicemails left after 4:00 p.m. may not be returned until the following business day.  We are closed weekends and major holidays. You have access to a nurse at all times for urgent questions. Please call the main number to the clinic Dept: [phone] and follow the prompts.   For any non-urgent questions, you may also contact your provider using MyChart. We now offer e-Visits for anyone 18 and older to request care online for non-urgent symptoms. For details visit [email or web address].   Also download the MyChart app! Go to the app store, search "MyChart", open the app, select [facility], and log in with your MyChart username and password.  

## 2022-08-17 NOTE — Progress Notes (Signed)
[place] [facility] Follow up:    [name], MD [address] [place] [address]   DIAGNOSIS:  Cancer Staging  Malignant neoplasm of upper-outer quadrant of left female breast [facility]) Staging form: Breast, AJCC 8th Edition - Clinical stage from 06/28/2022: Stage IB (cT1c, cN0(f), cM0, G3, ER+, PR-, HER2-) - Signed by Hayden Pedro, PA-C on 06/28/2022 Stage prefix: Initial diagnosis Method of lymph node assessment: Core biopsy Histologic grading system: 3 grade system   SUMMARY OF ONCOLOGIC HISTORY: Oncology History  Malignant neoplasm of upper-outer quadrant of left female breast ([place])  06/28/2022 Initial Diagnosis   Primary malignant neoplasm of upper inner quadrant of left female breast ([place])   06/28/2022 Cancer Staging   Staging form: Breast, AJCC 8th Edition - Clinical stage from 06/28/2022: Stage IB (cT1c, cN0(f), cM0, G3, ER+, PR-, HER2-) - Signed by Hayden Pedro, PA-C on 06/28/2022 Stage prefix: Initial diagnosis Method of lymph node assessment: Core biopsy Histologic grading system: 3 grade system   07/11/2022 Genetic Testing   Negative genetic testing on the 9 gene STAT panel.  The report date is July 05, 2022. Negative genetic testing on the Multi-cancer panel.  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  BLM c.3136G>A (p.Gly1046Ser), EGFR c.61G>A (p.Ala21Thr) and PDGFRA c.50G>T (p.Gly17Val) VUS identified.  The report date is July 11, 2022.  The STAT Breast cancer panel offered by [facility] includes sequencing and rearrangement analysis for the following 9 genes:  ATM, BRCA1, BRCA2, CDH1, CHEK2, PALB2, PTEN, STK11 and TP53.   The Multi-Cancer + RNA Panel offered by [facility] includes sequencing and/or deletion/duplication analysis of the following 70 genes:  AIP*, ALK, APC*, ATM*, AXIN2*, BAP1*, BARD1*, BLM*, BMPR1A*, BRCA1*, BRCA2*, BRIP1*, CDC73*, CDH1*, CDK4, CDKN1B*, CDKN2A, CHEK2*, CTNNA1*, DICER1*, EPCAM (del/dup only), EGFR, FH*, FLCN*, GREM1  (promoter dup only), HOXB13, KIT, LZTR1, MAX*, MBD4, MEN1*, MET, MITF, MLH1*, MSH2*, MSH3*, MSH6*, MUTYH*, NF1*, NF2*, NTHL1*, PALB2*, PDGFRA, PMS2*, POLD1*, POLE*, POT1*, PRKAR1A*, PTCH1*, PTEN*, RAD51C*, RAD51D*, RB1*, RET, SDHA* (sequencing only), SDHAF2*, SDHB*, SDHC*, SDHD*, SMAD4*, SMARCA4*, SMARCB1*, SMARCE1*, STK11*, SUFU*, TMEM127*, TP53*, TSC1*, TSC2*, VHL*. RNA analysis is performed for * genes.    07/20/2022 -  Chemotherapy   Patient is on Treatment Plan : BREAST ADJUVANT DOSE DENSE AC q14d / PACLitaxel q7d       CURRENT THERAPY: Adriamycin/Cytoxan  INTERVAL HISTORY: Kandis Ban 42 y.o. female returns for follow-up of her stage IB ER positive breast cancer here today for f/u and evaluation prior to receiving neoadjuvant chemotherapy with Adriamycin/Cytoxan.  She is tolerating this treatment moderately well.     She had an ER visit after her last cycle of treatment and would like to receive IV fluids after this cycle to hopefully avoid that.  Otherwise she tells me that she is doing well today.    Patient Active Problem List   Diagnosis Date Noted   Port-A-Cath in place 07/20/2022   Genetic testing 07/07/2022   Family history of stomach cancer 06/29/2022   Malignant neoplasm of upper-outer quadrant of left female breast ([place]) 06/27/2022   Seborrheic dermatitis of scalp 02/25/2018    is allergic to adriamycin [doxorubicin].  MEDICAL HISTORY: Past Medical History:  Diagnosis Date   Breast cancer ([place]) 06/23/2022   Family history of stomach cancer    GERD (gastroesophageal reflux disease)    Hyperthyroidism    Thyroid disease     SURGICAL HISTORY: Past Surgical History:  Procedure Laterality Date   BREAST BIOPSY Left 06/23/2022   [place]  LT BREAST BX W LOC DEV 1ST LESION IMG BX SPEC US GUIDE 06/23/2022 [facility] MAMMOGRAPHY   PORTACATH PLACEMENT Right 07/19/2022   Procedure: INSERTION PORT-A-CATH;  Surgeon: Jovita Kussmaul, MD;  Location: [facility];  Service:  General;  Laterality: Right;  87 MIN ROOM 8   WISDOM TOOTH EXTRACTION      SOCIAL HISTORY: Social History   Socioeconomic History   Marital status: Single    Spouse name: Not on file   Number of children: Not on file   Years of education: Not on file   Highest education level: Not on file  Occupational History   Not on file  Tobacco Use   Smoking status: Never   Smokeless tobacco: Never  Vaping Use   Vaping Use: Never used  Substance and Sexual Activity   Alcohol use: Yes    Comment: socially   Drug use: No   Sexual activity: Yes    Partners: Male    Birth control/protection: None    Comment: intercourse age 6, more than 6 sexual parters,   Other Topics Concern   Not on file  Social History Narrative   Not on file   Social Determinants of Health   Financial Resource Strain: Low Risk  (07/04/2022)   Overall Financial Resource Strain (CARDIA)    Difficulty of Paying Living Expenses: Not very hard  Food Insecurity: No Food Insecurity (06/28/2022)   Hunger Vital Sign    Worried About Running Out of Food in the Last Year: Never true    Ran Out of Food in the Last Year: Never true  Transportation Needs: No Transportation Needs (06/28/2022)   PRAPARE - Hydrologist (Medical): No    Lack of Transportation (Non-Medical): No  Physical Activity: Not on file  Stress: Not on file  Social Connections: Not on file  Intimate Partner Violence: Not At Risk (06/28/2022)   Humiliation, Afraid, Rape, and Kick questionnaire    Fear of Current or Ex-Partner: No    Emotionally Abused: No    Physically Abused: No    Sexually Abused: No    FAMILY HISTORY: Family History  Problem Relation Age of Onset   Heart attack Father 40   Stomach cancer Maternal Aunt        dx > 50   Throat cancer Maternal Uncle        dx 35s   Stomach cancer Maternal Uncle        dx > 50   Heart Problems Maternal Grandmother    Breast cancer Cousin        mother's maternal  first cousin    Review of Systems  Constitutional:  Positive for fatigue. Negative for appetite change, chills, fever and unexpected weight change.  HENT:   Negative for hearing loss, lump/mass and trouble swallowing.   Eyes:  Negative for eye problems and icterus.  Respiratory:  Negative for chest tightness, cough and shortness of breath.   Cardiovascular:  Negative for chest pain, leg swelling and palpitations.  Gastrointestinal:  Negative for abdominal distention, abdominal pain, constipation, diarrhea, nausea and vomiting.  Endocrine: Negative for hot flashes.  Genitourinary:  Negative for difficulty urinating.   Musculoskeletal:  Negative for arthralgias.  Skin:  Negative for itching and rash.  Neurological:  Negative for dizziness, extremity weakness, headaches and numbness.  Hematological:  Negative for adenopathy. Does not bruise/bleed easily.  Psychiatric/Behavioral:  Negative for depression. The patient is not nervous/anxious.  PHYSICAL EXAMINATION  ECOG PERFORMANCE STATUS: 1 - Symptomatic but completely ambulatory  Vitals:   08/17/22 1020  BP: 119/74  Pulse: 86  Resp: 20  Temp: (!) 97 F (36.1 C)  SpO2: 100%    Physical Exam Constitutional:      General: She is not in acute distress.    Appearance: Normal appearance. She is not toxic-appearing.  HENT:     Head: Normocephalic and atraumatic.  Eyes:     General: No scleral icterus. Cardiovascular:     Rate and Rhythm: Normal rate and regular rhythm.     Pulses: Normal pulses.     Heart sounds: Normal heart sounds.  Pulmonary:     Effort: Pulmonary effort is normal.     Breath sounds: Normal breath sounds.  Abdominal:     General: Abdomen is flat. Bowel sounds are normal. There is no distension.     Palpations: Abdomen is soft.     Tenderness: There is no abdominal tenderness.  Musculoskeletal:        General: No swelling.     Cervical back: Neck supple.  Lymphadenopathy:     Cervical: No cervical  adenopathy.  Skin:    General: Skin is warm and dry.     Findings: No rash.  Neurological:     General: No focal deficit present.     Mental Status: She is alert.  Psychiatric:        Mood and Affect: Mood normal.        Behavior: Behavior normal.     LABORATORY DATA:  CBC    Component Value Date/Time   WBC 10.5 08/17/2022 0939   WBC 15.4 (H) 07/22/2022 1535   RBC 3.31 (L) 08/17/2022 0939   HGB 9.3 (L) 08/17/2022 0939   HCT 29.2 (L) 08/17/2022 0939   PLT 243 08/17/2022 0939   MCV 88.2 08/17/2022 0939   MCH 28.1 08/17/2022 0939   MCHC 31.8 08/17/2022 0939   RDW 17.0 (H) 08/17/2022 0939   LYMPHSABS 2.1 08/17/2022 0939   MONOABS 0.7 08/17/2022 0939   EOSABS 0.1 08/17/2022 0939   BASOSABS 0.1 08/17/2022 0939    CMP     Component Value Date/Time   NA 141 08/17/2022 0939   K 3.5 08/17/2022 0939   CL 108 08/17/2022 0939   CO2 26 08/17/2022 0939   GLUCOSE 95 08/17/2022 0939   BUN 8 08/17/2022 0939   CREATININE 0.70 08/17/2022 0939   CALCIUM 8.5 (L) 08/17/2022 0939   PROT 6.4 (L) 08/17/2022 0939   ALBUMIN 4.0 08/17/2022 0939   AST 22 08/17/2022 0939   ALT 18 08/17/2022 0939   ALKPHOS 108 08/17/2022 0939   BILITOT 0.2 (L) 08/17/2022 0939   GFRNONAA >60 08/17/2022 0939        ASSESSMENT and THERAPY PLAN:   Malignant neoplasm of upper-outer quadrant of left female breast ([place]) Nicole Maddox is a 42 year old woman with stage Ib left-sided ER positive breast cancer diagnosed in January 2024 here for follow-up and evaluation prior to receiving cycle 3 of neoadjuvant chemotherapy with her third cycle Adriamycin and Cytoxan.  Treatment Plan: Neoadjuvant chemotherapy with Adriamycin/Cytoxan every 2 weeks x 4 followed by weekly Taxol.  Breast Conserving surgery with sentinel node biopsy Adjuvant Radiation therapy Antiestrogen therapy  Chemo toxicities:  Fatigue-managing with energy conservation Dehydration--to receive IV fluids on Udenyca injection day  We will see  her back in 2 weeks for labs, f/u, and her next treatment.      All questions were  answered. The patient knows to call the clinic with any problems, questions or concerns. We can certainly see the patient much sooner if necessary.  Total encounter time:30 minutes*in face-to-face visit time, chart review, lab review, care coordination, order entry, and documentation of the encounter time.  Wilber Bihari, NP 08/21/22 1:45 PM Medical Oncology and Hematology [facility] [place],  [address] Tel. [phone]    Fax. [phone]  *Total Encounter Time as defined by the Centers for Medicare and Medicaid Services includes, in addition to the face-to-face time of a patient visit (documented in the note above) non-face-to-face time: obtaining and reviewing outside history, ordering and reviewing medications, tests or procedures, care coordination (communications with other health care professionals or caregivers) and documentation in the medical record.

## 2022-08-17 NOTE — Telephone Encounter (Signed)
Scheduled appointment per 3/14 secure chat (from [facility] for IVF). Patient is aware of the made appointment.

## 2022-08-18 ENCOUNTER — Inpatient Hospital Stay: Payer: Managed Care, Other (non HMO)

## 2022-08-18 VITALS — BP 111/65 | HR 68 | Temp 97.6°F | Resp 16

## 2022-08-18 DIAGNOSIS — C50412 Malignant neoplasm of upper-outer quadrant of left female breast: Secondary | ICD-10-CM

## 2022-08-18 DIAGNOSIS — Z5111 Encounter for antineoplastic chemotherapy: Secondary | ICD-10-CM | POA: Diagnosis not present

## 2022-08-18 MED ORDER — SODIUM CHLORIDE 0.9 % IV SOLN: Freq: Once | INTRAVENOUS | Status: AC

## 2022-08-18 MED ORDER — SODIUM CHLORIDE 0.9% FLUSH
10.0000 mL | Freq: Once | INTRAVENOUS | Status: AC
  Administered 2022-08-18: 10 mL via INTRAVENOUS

## 2022-08-18 MED ORDER — HEPARIN SOD (PORK) LOCK FLUSH 100 UNIT/ML IV SOLN
500.0000 [IU] | Freq: Once | INTRAVENOUS | Status: AC
  Administered 2022-08-18: 500 [IU] via INTRAVENOUS

## 2022-08-19 ENCOUNTER — Inpatient Hospital Stay: Payer: Managed Care, Other (non HMO)

## 2022-08-19 VITALS — BP 110/72 | HR 79 | Temp 96.8°F | Resp 13

## 2022-08-19 DIAGNOSIS — Z5111 Encounter for antineoplastic chemotherapy: Secondary | ICD-10-CM | POA: Diagnosis not present

## 2022-08-19 DIAGNOSIS — C50412 Malignant neoplasm of upper-outer quadrant of left female breast: Secondary | ICD-10-CM

## 2022-08-19 MED ORDER — PEGFILGRASTIM-CBQV 6 MG/0.6ML ~~LOC~~ SOSY
6.0000 mg | PREFILLED_SYRINGE | Freq: Once | SUBCUTANEOUS | Status: AC
  Administered 2022-08-19: 6 mg via SUBCUTANEOUS

## 2022-08-21 ENCOUNTER — Encounter: Payer: Self-pay | Admitting: Hematology and Oncology

## 2022-08-21 NOTE — Assessment & Plan Note (Signed)
Nicole Maddox is a 42 year old woman with stage Ib left-sided ER positive breast cancer diagnosed in January 2024 here for follow-up and evaluation prior to receiving cycle 3 of neoadjuvant chemotherapy with her third cycle Adriamycin and Cytoxan.  Treatment Plan: Neoadjuvant chemotherapy with Adriamycin/Cytoxan every 2 weeks x 4 followed by weekly Taxol.  Breast Conserving surgery with sentinel node biopsy Adjuvant Radiation therapy Antiestrogen therapy  Chemo toxicities:  Fatigue-managing with energy conservation Dehydration--to receive IV fluids on Udenyca injection day  We will see her back in 2 weeks for labs, f/u, and her next treatment.

## 2022-08-24 ENCOUNTER — Encounter: Payer: Self-pay | Admitting: *Deleted

## 2022-08-30 MED FILL — Fosaprepitant Dimeglumine For IV Infusion 150 MG (Base Eq): INTRAVENOUS | Qty: 5 | Status: AC

## 2022-08-30 MED FILL — Dexamethasone Sodium Phosphate Inj 100 MG/10ML: INTRAMUSCULAR | Qty: 1 | Status: AC

## 2022-08-31 ENCOUNTER — Encounter: Payer: Self-pay | Admitting: Hematology and Oncology

## 2022-08-31 ENCOUNTER — Inpatient Hospital Stay: Payer: Managed Care, Other (non HMO)

## 2022-08-31 ENCOUNTER — Encounter: Payer: Self-pay | Admitting: Adult Health

## 2022-08-31 ENCOUNTER — Inpatient Hospital Stay: Payer: Managed Care, Other (non HMO) | Admitting: Adult Health

## 2022-08-31 VITALS — BP 113/65 | HR 88 | Temp 97.5°F | Resp 18 | Ht 64.0 in | Wt 170.4 lb

## 2022-08-31 DIAGNOSIS — C50412 Malignant neoplasm of upper-outer quadrant of left female breast: Secondary | ICD-10-CM

## 2022-08-31 DIAGNOSIS — Z170 Estrogen receptor positive status [ER+]: Secondary | ICD-10-CM

## 2022-08-31 DIAGNOSIS — Z5111 Encounter for antineoplastic chemotherapy: Secondary | ICD-10-CM | POA: Diagnosis not present

## 2022-08-31 DIAGNOSIS — Z95828 Presence of other vascular implants and grafts: Secondary | ICD-10-CM

## 2022-08-31 LAB — CMP (CANCER CENTER ONLY)
ALT: 15 U/L (ref 0–44)
AST: 18 U/L (ref 15–41)
Albumin: 4.1 g/dL (ref 3.5–5.0)
Alkaline Phosphatase: 108 U/L (ref 38–126)
Anion gap: 7 (ref 5–15)
BUN: 8 mg/dL (ref 6–20)
CO2: 26 mmol/L (ref 22–32)
Calcium: 8.6 mg/dL — ABNORMAL LOW (ref 8.9–10.3)
Chloride: 109 mmol/L (ref 98–111)
Creatinine: 0.75 mg/dL (ref 0.44–1.00)
GFR, Estimated: >60 mL/min (ref >60)
Glucose, Bld: 117 mg/dL — ABNORMAL HIGH (ref 70–99)
Potassium: 3.5 mmol/L (ref 3.5–5.1)
Sodium: 142 mmol/L (ref 135–145)
Total Bilirubin: 0.2 mg/dL — ABNORMAL LOW (ref 0.3–1.2)
Total Protein: 6.5 g/dL (ref 6.5–8.1)

## 2022-08-31 LAB — CBC WITH DIFFERENTIAL (CANCER CENTER ONLY)
Abs Immature Granulocytes: 2.42 10*3/uL — ABNORMAL HIGH (ref 0.00–0.07)
Basophils Absolute: 0.2 10*3/uL — ABNORMAL HIGH (ref 0.0–0.1)
Basophils Relative: 1 %
Eosinophils Absolute: 0.2 10*3/uL (ref 0.0–0.5)
Eosinophils Relative: 2 %
HCT: 29.1 % — ABNORMAL LOW (ref 36.0–46.0)
Hemoglobin: 9.5 g/dL — ABNORMAL LOW (ref 12.0–15.0)
Immature Granulocytes: 18 %
Lymphocytes Relative: 16 %
Lymphs Abs: 2 10*3/uL (ref 0.7–4.0)
MCH: 28.1 pg (ref 26.0–34.0)
MCHC: 32.6 g/dL (ref 30.0–36.0)
MCV: 86.1 fL (ref 80.0–100.0)
Monocytes Absolute: 1.1 10*3/uL — ABNORMAL HIGH (ref 0.1–1.0)
Monocytes Relative: 8 %
Neutro Abs: 7.3 10*3/uL (ref 1.7–7.7)
Neutrophils Relative %: 55 %
Platelet Count: 239 10*3/uL (ref 150–400)
RBC: 3.38 MIL/uL — ABNORMAL LOW (ref 3.87–5.11)
RDW: 17.9 % — ABNORMAL HIGH (ref 11.5–15.5)
WBC Count: 13.1 10*3/uL — ABNORMAL HIGH (ref 4.0–10.5)
nRBC: 1 % — ABNORMAL HIGH (ref 0.0–0.2)

## 2022-08-31 MED ORDER — HEPARIN SOD (PORK) LOCK FLUSH 100 UNIT/ML IV SOLN: 500.0000 [IU] | Freq: Once | INTRAVENOUS | Status: DC | PRN

## 2022-08-31 MED ORDER — SODIUM CHLORIDE 0.9 % IV SOLN
150.0000 mg | Freq: Once | INTRAVENOUS | Status: AC
  Administered 2022-08-31: 150 mg via INTRAVENOUS
  Filled 2022-08-31: qty 150

## 2022-08-31 MED ORDER — SODIUM CHLORIDE 0.9% FLUSH
10.0000 mL | Freq: Once | INTRAVENOUS | Status: AC
  Administered 2022-08-31: 10 mL

## 2022-08-31 MED ORDER — SODIUM CHLORIDE 0.9 % IV SOLN
10.0000 mg | Freq: Once | INTRAVENOUS | Status: AC
  Administered 2022-08-31: 10 mg via INTRAVENOUS
  Filled 2022-08-31: qty 10

## 2022-08-31 MED ORDER — SODIUM CHLORIDE 0.9 % IV SOLN: Freq: Once | INTRAVENOUS | Status: AC

## 2022-08-31 MED ORDER — SODIUM CHLORIDE 0.9 % IV SOLN
600.0000 mg/m2 | Freq: Once | INTRAVENOUS | Status: AC
  Administered 2022-08-31: 1100 mg via INTRAVENOUS
  Filled 2022-08-31: qty 55

## 2022-08-31 MED ORDER — SODIUM CHLORIDE 0.9% FLUSH: 10.0000 mL | INTRAVENOUS | Status: DC | PRN

## 2022-08-31 MED ORDER — LORAZEPAM 2 MG/ML IJ SOLN
0.5000 mg | Freq: Once | INTRAMUSCULAR | Status: AC
  Administered 2022-08-31: 0.5 mg via INTRAVENOUS
  Filled 2022-08-31: qty 1

## 2022-08-31 MED ORDER — PALONOSETRON HCL INJECTION 0.25 MG/5ML
0.2500 mg | Freq: Once | INTRAVENOUS | Status: AC
  Administered 2022-08-31: 0.25 mg via INTRAVENOUS
  Filled 2022-08-31: qty 5

## 2022-08-31 MED ORDER — DOXORUBICIN HCL CHEMO IV INJECTION 2 MG/ML
60.0000 mg/m2 | Freq: Once | INTRAVENOUS | Status: AC
  Administered 2022-08-31: 110 mg via INTRAVENOUS
  Filled 2022-08-31: qty 55

## 2022-08-31 NOTE — Assessment & Plan Note (Addendum)
Nicole Maddox is a 42 year old woman with 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  Treatment Plan: Neoadjuvant chemotherapy with Adriamycin/Cytoxan every 2 weeks x 4 followed by weekly Taxol x 12.  Breast Conserving surgery with sentinel node biopsy Adjuvant Radiation therapy Antiestrogen therapy  Chemo toxicities:  Fatigue-managing with energy conservation Dehydration-IVF on injection day Constipation: continue stool softeners/Dulcolax PRN We briefly discussed the next portion of her chemotherapy regimen which is weekly Paclitaxel.  She will return in 2 weeks for labs, f/u, and to begin week one of this therapy.   We will see her back in 2 weeks for labs, f/u, and her next treatment.

## 2022-08-31 NOTE — Progress Notes (Signed)
[facility] Follow up:    Nicole Bald, MD [address] [place] [address]   DIAGNOSIS:  Cancer Staging  Malignant neoplasm of upper-outer quadrant of left female breast [facility]) Staging form: Breast, AJCC 8th Edition - Clinical stage from 06/28/2022: Stage IB (cT1c, cN0(f), cM0, G3, ER+, PR-, HER2-) - Signed by Nicole Pedro, PA-C on 06/28/2022 Stage prefix: Initial diagnosis Method of lymph node assessment: Core biopsy Histologic grading system: 3 grade system   SUMMARY OF ONCOLOGIC HISTORY: Oncology History  Malignant neoplasm of upper-outer quadrant of left female breast ([place])  06/28/2022 Initial Diagnosis   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  The report date is July 05, 2022. Negative genetic testing on the Multi-cancer panel.  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  BLM c.3136G>A (p.Gly1046Ser), EGFR c.61G>A (p.Ala21Thr) and PDGFRA c.50G>T (p.Gly17Val) VUS identified.  The report date is July 11, 2022.  The STAT Breast cancer panel offered by [facility] includes sequencing and rearrangement analysis for the following 9 genes:  ATM, BRCA1, BRCA2, CDH1, CHEK2, PALB2, PTEN, STK11 and TP53.   The Multi-Cancer + RNA Panel offered by [facility] includes sequencing and/or deletion/duplication analysis of the following 70 genes:  AIP*, ALK, APC*, ATM*, AXIN2*, BAP1*, BARD1*, BLM*, BMPR1A*, BRCA1*, BRCA2*, BRIP1*, CDC73*, CDH1*, CDK4, CDKN1B*, CDKN2A, CHEK2*, CTNNA1*, DICER1*, EPCAM (del/dup only), EGFR, FH*, FLCN*, GREM1  (promoter dup only), HOXB13, KIT, LZTR1, MAX*, MBD4, MEN1*, MET, MITF, MLH1*, MSH2*, MSH3*, MSH6*, MUTYH*, NF1*, NF2*, NTHL1*, PALB2*, PDGFRA, PMS2*, POLD1*, POLE*, POT1*, PRKAR1A*, PTCH1*, PTEN*, RAD51C*, RAD51D*, RB1*, RET, SDHA* (sequencing only), SDHAF2*, SDHB*, SDHC*, SDHD*, SMAD4*, SMARCA4*, SMARCB1*, SMARCE1*, STK11*, SUFU*, TMEM127*, TP53*, TSC1*, TSC2*, VHL*. RNA analysis is performed for * genes.    07/20/2022 -  Chemotherapy   Patient is on Treatment Plan : BREAST ADJUVANT DOSE DENSE AC q14d / PACLitaxel q7d       CURRENT THERAPY: Adriamycin and Cytoxan  INTERVAL HISTORY: Nicole Maddox 42 y.o. female returns for f/u prior to receiving her fourth and final cycle of neoadjuvant Adriamycin and Cytoxan. She is feeling moderately well today.  She is mildly fatigued and managed constipation with stool softener or miralax. She is    Patient Active Problem List   Diagnosis Date Noted   Port-A-Cath in place 07/20/2022   Genetic testing 07/07/2022   Family history of stomach cancer 06/29/2022   Malignant neoplasm of upper-outer quadrant of left female breast ([place]) 06/27/2022   Seborrheic dermatitis of scalp 02/25/2018    is allergic to adriamycin [doxorubicin].  MEDICAL HISTORY: Past Medical History:  Diagnosis Date   Breast cancer ([place]) 06/23/2022   Family history of stomach cancer    GERD (gastroesophageal reflux disease)    Hyperthyroidism    Thyroid disease     SURGICAL HISTORY: Past Surgical History:  Procedure Laterality Date   BREAST BIOPSY Left 06/23/2022   [place] LT BREAST BX W LOC DEV 1ST LESION IMG BX [place] US GUIDE 06/23/2022 [facility] MAMMOGRAPHY   PORTACATH PLACEMENT Right 07/19/2022   Procedure: INSERTION PORT-A-CATH;  Surgeon: Jovita Kussmaul, MD;  Location: [place];  Service: General;  Laterality: Right;  60 MIN ROOM 8   WISDOM TOOTH EXTRACTION      SOCIAL HISTORY: Social History   Socioeconomic History   Marital status: Single    Spouse name:  Not on file   Number of children: Not on file   Years of education: Not on file   Highest education level: Not on file  Occupational History   Not on file  Tobacco Use   Smoking status: Never   Smokeless tobacco: Never  Vaping Use   Vaping Use: Never used  Substance and Sexual Activity   Alcohol use: Yes    Comment: socially   Drug use: No   Sexual activity: Yes    Partners: Male    Birth control/protection: None    Comment: intercourse age 48, more than 6 sexual parters,   Other Topics Concern   Not on file  Social History Narrative   Not on file   Social Determinants of Health   Financial Resource Strain: Low Risk  (07/04/2022)   Overall Financial Resource Strain (CARDIA)    Difficulty of Paying Living Expenses: Not very hard  Food Insecurity: No Food Insecurity (06/28/2022)   Hunger Vital Sign    Worried About Running Out of Food in the Last Year: Never true    Ran Out of Food in the Last Year: Never true  Transportation Needs: No Transportation Needs (06/28/2022)   PRAPARE - Hydrologist (Medical): No    Lack of Transportation (Non-Medical): No  Physical Activity: Not on file  Stress: Not on file  Social Connections: Not on file  Intimate Partner Violence: Not At Risk (06/28/2022)   Humiliation, Afraid, Rape, and Kick questionnaire    Fear of Current or Ex-Partner: No    Emotionally Abused: No    Physically Abused: No    Sexually Abused: No    FAMILY HISTORY: Family History  Problem Relation Age of Onset   Heart attack Father 59   Stomach cancer Maternal Aunt        dx > 50   Throat cancer Maternal Uncle        dx 67s   Stomach cancer Maternal Uncle        dx > 50   Heart Problems Maternal Grandmother    Breast cancer Cousin        mother's maternal first cousin    Review of Systems  Constitutional:  Positive for fatigue. Negative for appetite change, chills, fever and unexpected weight change.  HENT:   Negative for hearing  loss, lump/mass and trouble swallowing.   Eyes:  Negative for eye problems and icterus.  Respiratory:  Negative for chest tightness, cough and shortness of breath.   Cardiovascular:  Negative for chest pain, leg swelling and palpitations.  Gastrointestinal:  Negative for abdominal distention, abdominal pain, constipation, diarrhea, nausea and vomiting.  Endocrine: Negative for hot flashes.  Genitourinary:  Negative for difficulty urinating.   Musculoskeletal:  Negative for arthralgias.  Skin:  Negative for itching and rash.  Neurological:  Negative for dizziness, extremity weakness, headaches and numbness.  Hematological:  Negative for adenopathy. Does not bruise/bleed easily.  Psychiatric/Behavioral:  Negative for depression. The patient is not nervous/anxious.       PHYSICAL EXAMINATION  ECOG PERFORMANCE STATUS: 1 - Symptomatic but completely ambulatory  Vitals:   08/31/22 1131  BP: 113/65  Pulse: 88  Resp: 18  Temp: (!) 97.5 F (  36.4 C)  SpO2: 100%    Physical Exam Constitutional:      General: She is not in acute distress.    Appearance: Normal appearance. She is not toxic-appearing.  HENT:     Head: Normocephalic and atraumatic.  Eyes:     General: No scleral icterus. Cardiovascular:     Rate and Rhythm: Normal rate and regular rhythm.     Pulses: Normal pulses.     Heart sounds: Normal heart sounds.  Pulmonary:     Effort: Pulmonary effort is normal.     Breath sounds: Normal breath sounds.  Abdominal:     General: Abdomen is flat. Bowel sounds are normal. There is no distension.     Palpations: Abdomen is soft.     Tenderness: There is no abdominal tenderness.  Musculoskeletal:        General: No swelling.     Cervical back: Neck supple.  Lymphadenopathy:     Cervical: No cervical adenopathy.  Skin:    General: Skin is warm and dry.     Findings: No rash.  Neurological:     General: No focal deficit present.     Mental Status: She is alert.   Psychiatric:        Mood and Affect: Mood normal.        Behavior: Behavior normal.     LABORATORY DATA:  CBC    Component Value Date/Time   WBC 13.1 (H) 08/31/2022 1131   WBC 15.4 (H) 07/22/2022 1535   RBC 3.38 (L) 08/31/2022 1131   HGB 9.5 (L) 08/31/2022 1131   HCT 29.1 (L) 08/31/2022 1131   PLT 239 08/31/2022 1131   MCV 86.1 08/31/2022 1131   MCH 28.1 08/31/2022 1131   MCHC 32.6 08/31/2022 1131   RDW 17.9 (H) 08/31/2022 1131   LYMPHSABS 2.0 08/31/2022 1131   MONOABS 1.1 (H) 08/31/2022 1131   EOSABS 0.2 08/31/2022 1131   BASOSABS 0.2 (H) 08/31/2022 1131    CMP     Component Value Date/Time   NA 142 08/31/2022 1131   K 3.5 08/31/2022 1131   CL 109 08/31/2022 1131   CO2 26 08/31/2022 1131   GLUCOSE 117 (H) 08/31/2022 1131   BUN 8 08/31/2022 1131   CREATININE 0.75 08/31/2022 1131   CALCIUM 8.6 (L) 08/31/2022 1131   PROT 6.5 08/31/2022 1131   ALBUMIN 4.1 08/31/2022 1131   AST 18 08/31/2022 1131   ALT 15 08/31/2022 1131   ALKPHOS 108 08/31/2022 1131   BILITOT 0.2 (L) 08/31/2022 1131   GFRNONAA >60 08/31/2022 1131        ASSESSMENT and THERAPY PLAN:   Malignant neoplasm of upper-outer quadrant of left female breast ([facility]) [place]le[place] is a 43 year old woman with 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  Treatment Plan: Neoadjuvant chemotherapy with Adriamycin/Cytoxan every 2 weeks x 4 followed by weekly Taxol x 12.  Breast Conserving surgery with sentinel node biopsy Adjuvant Radiation therapy Antiestrogen therapy  Chemo toxicities:  Fatigue-managing with energy conservation Dehydration-IVF on injection day Constipation: continue stool softeners/Dulcolax PRN We briefly discussed the next portion of her chemotherapy regimen which is weekly Paclitaxel.  She will return in 2 weeks for labs, f/u, and to begin week  one of this therapy.   We will see her back in 2 weeks for labs, f/u, and her next treatment.  All questions were answered. The patient knows to call the clinic with any problems, questions or concerns. We can certainly see the patient much sooner if necessary.  Total encounter time:30 minutes*in face-to-face visit time, chart review, lab review, care coordination, order entry, and documentation of the encounter time.  Wilber Bihari, NP 09/03/22 3:51 PM Medical Oncology and Hematology [facility] [place], [place] [address] Tel. [phone]    Fax. [phone]  *Total Encounter Time as defined by the Centers for Medicare and Medicaid Services includes, in addition to the face-to-face time of a patient visit (documented in the note above) non-face-to-face time: obtaining and reviewing outside history, ordering and reviewing medications, tests or procedures, care coordination (communications with other health care professionals or caregivers) and documentation in the medical record.

## 2022-09-02 ENCOUNTER — Inpatient Hospital Stay: Payer: Managed Care, Other (non HMO)

## 2022-09-02 VITALS — BP 113/71 | HR 69 | Temp 98.4°F | Resp 18

## 2022-09-02 DIAGNOSIS — C50412 Malignant neoplasm of upper-outer quadrant of left female breast: Secondary | ICD-10-CM

## 2022-09-02 DIAGNOSIS — Z170 Estrogen receptor positive status [ER+]: Secondary | ICD-10-CM

## 2022-09-02 DIAGNOSIS — Z5111 Encounter for antineoplastic chemotherapy: Secondary | ICD-10-CM | POA: Diagnosis not present

## 2022-09-02 DIAGNOSIS — Z95828 Presence of other vascular implants and grafts: Secondary | ICD-10-CM

## 2022-09-02 MED ORDER — HEPARIN SOD (PORK) LOCK FLUSH 100 UNIT/ML IV SOLN
500.0000 [IU] | Freq: Once | INTRAVENOUS | Status: AC
  Administered 2022-09-02: 500 [IU]

## 2022-09-02 MED ORDER — PEGFILGRASTIM-CBQV 6 MG/0.6ML ~~LOC~~ SOSY
6.0000 mg | PREFILLED_SYRINGE | Freq: Once | SUBCUTANEOUS | Status: AC
  Administered 2022-09-02: 6 mg via SUBCUTANEOUS

## 2022-09-02 MED ORDER — SODIUM CHLORIDE 0.9% FLUSH
10.0000 mL | Freq: Once | INTRAVENOUS | Status: AC
  Administered 2022-09-02: 10 mL

## 2022-09-02 MED ORDER — SODIUM CHLORIDE 0.9 % IV SOLN: Freq: Once | INTRAVENOUS | Status: AC

## 2022-09-02 NOTE — Patient Instructions (Signed)
Pegfilgrastim Injection What is this medication? PEGFILGRASTIM (PEG fil gra stim) lowers the risk of infection in people who are receiving chemotherapy. It works by helping your body make more white blood cells, which protects your body from infection. It may also be used to help people who have been exposed to high doses of radiation. This medicine may be used for other purposes; ask your health care provider or pharmacist if you have questions. COMMON BRAND NAME(S): Fulphila, Fylnetra, Neulasta, Nyvepria, Stimufend, UDENYCA, Ziextenzo What should I tell my care team before I take this medication? They need to know if you have any of these conditions: Kidney disease Latex allergy Ongoing radiation therapy Sickle cell disease Skin reactions to acrylic adhesives (On-Body Injector only) An unusual or allergic reaction to pegfilgrastim, filgrastim, other medications, foods, dyes, or preservatives Pregnant or trying to get pregnant Breast-feeding How should I use this medication? This medication is for injection under the skin. If you get this medication at home, you will be taught how to prepare and give the pre-filled syringe or how to use the On-body Injector. Refer to the patient Instructions for Use for detailed instructions. Use exactly as directed. Tell your care team immediately if you suspect that the On-body Injector may not have performed as intended or if you suspect the use of the On-body Injector resulted in a missed or partial dose. It is important that you put your used needles and syringes in a special sharps container. Do not put them in a trash can. If you do not have a sharps container, call your pharmacist or care team to get one. Talk to your care team about the use of this medication in children. While this medication may be prescribed for selected conditions, precautions do apply. Overdosage: If you think you have taken too much of this medicine contact a poison control center  or emergency room at once. NOTE: This medicine is only for you. Do not share this medicine with others. What if I miss a dose? It is important not to miss your dose. Call your care team if you miss your dose. If you miss a dose due to an On-body Injector failure or leakage, a new dose should be administered as soon as possible using a single prefilled syringe for manual use. What may interact with this medication? Interactions have not been studied. This list may not describe all possible interactions. Give your health care provider a list of all the medicines, herbs, non-prescription drugs, or dietary supplements you use. Also tell them if you smoke, drink alcohol, or use illegal drugs. Some items may interact with your medicine. What should I watch for while using this medication? Your condition will be monitored carefully while you are receiving this medication. You may need blood work done while you are taking this medication. Talk to your care team about your risk of cancer. You may be more at risk for certain types of cancer if you take this medication. If you are going to need a MRI, CT scan, or other procedure, tell your care team that you are using this medication (On-Body Injector only). What side effects may I notice from receiving this medication? Side effects that you should report to your care team as soon as possible: Allergic reactions--skin rash, itching, hives, swelling of the face, lips, tongue, or throat Capillary leak syndrome--stomach or muscle pain, unusual weakness or fatigue, feeling faint or lightheaded, decrease in the amount of urine, swelling of the ankles, hands, or feet, trouble   breathing High white blood cell level--fever, fatigue, trouble breathing, night sweats, change in vision, weight loss Inflammation of the aorta--fever, fatigue, back, chest, or stomach pain, severe headache Kidney injury (glomerulonephritis)--decrease in the amount of urine, red or dark brown  urine, foamy or bubbly urine, swelling of the ankles, hands, or feet Shortness of breath or trouble breathing Spleen injury--pain in upper left stomach or shoulder Unusual bruising or bleeding Side effects that usually do not require medical attention (report to your care team if they continue or are bothersome): Bone pain Pain in the hands or feet This list may not describe all possible side effects. Call your doctor for medical advice about side effects. You may report side effects to FDA at 1-800-FDA-1088. Where should I keep my medication? Keep out of the reach of children. If you are using this medication at home, you will be instructed on how to store it. Throw away any unused medication after the expiration date on the label. NOTE: This sheet is a summary. It may not cover all possible information. If you have questions about this medicine, talk to your doctor, pharmacist, or health care provider.  2023 Elsevier/Gold Standard (2021-02-04 00:00:00)  Dehydration, Adult Dehydration is a condition in which there is not enough water or other fluids in the body. This happens when a person loses more fluids than they take in. Important organs, such as the kidneys, brain, and heart, cannot function without a proper amount of fluids. Any loss of fluids from the body can lead to dehydration. Dehydration can be mild, moderate, or severe. It should be treated right away to prevent it from becoming severe. What are the causes? Dehydration may be caused by: Health conditions, such as diarrhea, vomiting, fever, infection, or sweating or urinating a lot. Not drinking enough fluids. Certain medicines, such as medicines that remove excess fluid from the body (diuretics). Lack of safe drinking water. Not being able to get enough water and food. What increases the risk? The following factors may make you more likely to develop this condition: Having a long-term (chronic) illness that has not been  treated properly, such as diabetes, heart disease, or kidney disease. Being 3 years of age or older. Having a disability. Living in a place that is high in altitude, where thinner, drier air causes more fluid loss. Doing exercises that put stress on your body for a long time (endurance sports). Being active in a hot climate. What are the signs or symptoms? Symptoms of dehydration depend on how severe it is. Mild or moderate dehydration Thirst. Dry lips or dry mouth. Dizziness or light-headedness. Muscle cramps. Dark urine. Urine may be the color of tea. Less urine or tears produced than usual. Headache. Severe dehydration Changes in skin. Your skin may be cold and clammy, blotchy, or pale. Your skin also may not return to normal after being lightly pinched and released. Little or no tears, urine, or sweat. Rapid breathing and low blood pressure. Your pulse may be weak or may be faster than 100 beats per minute when you are sitting still. Other changes, such as: Feeling very thirsty. Sunken eyes. Cold hands and feet. Confusion. Being very tired (lethargic) or having trouble waking from sleep. Short-term weight loss. Loss of consciousness. How is this diagnosed? This condition is diagnosed based on your symptoms and a physical exam. You may have blood and urine tests to help confirm the diagnosis. How is this treated? Treatment for this condition depends on how severe it is. Treatment  should be started right away. Do not wait until dehydration becomes severe. Severe dehydration is an emergency and needs to be treated in a hospital. Mild or moderate dehydration can be treated at home. You may be asked to: Drink more fluids. Drink an oral rehydration solution (ORS). This drink restores fluids, salts, and minerals in the blood (electrolytes). Stop any activities that caused dehydration, such as exercise. Cool off with cool compresses, cool mist, or cool fluids, if heat or too much  sweat caused your condition. Take medicine to treat fever, if fever caused your condition. Take medicine to treat nausea and diarrhea, if vomiting or diarrhea caused your condition. Severe dehydration can be treated: With IV fluids. By correcting abnormal levels of electrolytes in your body. By treating the underlying cause of dehydration. Follow these instructions at home: Oral rehydration solution If told by your health care provider, drink an ORS: Make an ORS by following instructions on the package. Start by drinking small amounts, about  cup (120 mL) every 5-10 minutes. Slowly increase how much you drink until you have taken the amount recommended by your health care provider.  Eating and drinking  Drink enough clear fluid to keep your urine pale yellow. If you were told to drink an ORS, finish the ORS first and then start slowly drinking other clear fluids. Drink fluids such as: Water. Do not drink only water. Doing that can lead to hyponatremia, which is having too little salt (sodium) in the body. Water from ice chips you suck on. Diluted fruit juice. This is fruit juice that you have added water to. Low-calorie sports drinks. Eat foods that contain a healthy balance of electrolytes, such as bananas, oranges, potatoes, tomatoes, and spinach. Do not drink alcohol. Avoid the following: Drinks that contain a lot of sugar. These include high-calorie sports drinks, fruit juice that is not diluted, and soda. Caffeine. Foods that are greasy or contain a lot of fat or sugar. General instructions Take over-the-counter and prescription medicines only as told by your health care provider. Do not take sodium tablets. Doing that can lead to having too much sodium in the body (hypernatremia). Return to your normal activities as told by your health care provider. Ask your health care provider what activities are safe for you. Keep all follow-up visits. Your health care provider may need to  check your progress and suggest new ways to treat your condition. Contact a health care provider if: You have muscle cramps, pain, or discomfort, such as: Pain in your abdomen and the pain gets worse or stays in one area. Stiff neck. You have a rash. You are more irritable than usual. You are sleepier or have a harder time waking. You feel weak or dizzy. You feel very thirsty. Get help right away if: You have symptoms of severe dehydration. You vomit every time you eat or drink. Your vomiting gets worse, does not go away, or includes blood or green matter (bile). You are getting treatment but symptoms are getting worse. You have a fever. You have a severe headache. You have: Diarrhea that gets worse or does not go away. Blood in your stool. This may cause stool to look black and tarry. Not urinating, or urinating only a small amount of very dark urine, within 6-8 hours. You have trouble breathing. These symptoms may be an emergency. Get help right away. Do not wait to see if the symptoms will go away. Do not drive yourself to the hospital. Call 911. This information  is not intended to replace advice given to you by your health care provider. Make sure you discuss any questions you have with your health care provider. Document Revised: 12/19/2021 Document Reviewed: 12/19/2021 Elsevier Patient Education  [place].

## 2022-09-03 ENCOUNTER — Encounter: Payer: Self-pay | Admitting: Hematology and Oncology

## 2022-09-07 ENCOUNTER — Encounter: Payer: Self-pay | Admitting: *Deleted

## 2022-09-12 MED FILL — Dexamethasone Sodium Phosphate Inj 100 MG/10ML: INTRAMUSCULAR | Qty: 1 | Status: AC

## 2022-09-12 NOTE — Progress Notes (Unsigned)
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  The report date is July 05, 2022. Negative genetic testing on the Multi-cancer panel.  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  BLM c.3136G>A (p.Gly1046Ser), EGFR c.61G>A (p.Ala21Thr) and PDGFRA c.50G>T (p.Gly17Val) VUS identified.  The report date is July 11, 2022.  The STAT Breast cancer panel offered by [facility] includes sequencing and rearrangement analysis for the following 9 genes:  ATM, BRCA1, BRCA2, CDH1, CHEK2, PALB2, PTEN, STK11 and TP53.   The Multi-Cancer + RNA Panel offered by [facility] includes sequencing and/or deletion/duplication analysis of the following 70 genes:  AIP*, ALK, APC*, ATM*, AXIN2*, BAP1*, BARD1*, BLM*, BMPR1A*, BRCA1*, BRCA2*, BRIP1*, CDC73*, CDH1*, CDK4, CDKN1B*, CDKN2A, CHEK2*, CTNNA1*, DICER1*, EPCAM (del/dup only), EGFR, FH*, FLCN*, GREM1 (promoter dup only), HOXB13, KIT, LZTR1, MAX*,  MBD4, MEN1*, MET, MITF, MLH1*, MSH2*, MSH3*, MSH6*, MUTYH*, NF1*, NF2*, NTHL1*, PALB2*, PDGFRA, PMS2*, POLD1*, POLE*, POT1*, PRKAR1A*, PTCH1*, PTEN*, RAD51C*, RAD51D*, RB1*, RET, SDHA* (sequencing only), SDHAF2*, SDHB*, SDHC*, SDHD*, SMAD4*, SMARCA4*, SMARCB1*, SMARCE1*, STK11*, SUFU*, TMEM127*, TP53*, TSC1*, TSC2*, VHL*. RNA analysis is performed for * genes.    07/20/2022 -  Chemotherapy   Patient is on Treatment Plan : BREAST ADJUVANT DOSE DENSE AC q14d / PACLitaxel q7d        CURRENT THERAPY: Neoadjuvant chemo AC-T  INTERVAL HISTORY Ms. Mecca presents for follow up and treatment as scheduled. Last seen by Lillard Anes, NP 3/28 for final cycle 4 AC. Here for f/up prior to starting weekly taxol.   ROS   Past Medical History:  Diagnosis Date   Breast cancer (HCC) 06/23/2022   Family history of stomach cancer    GERD (gastroesophageal reflux disease)    Hyperthyroidism    Thyroid disease      Past Surgical History:  Procedure Laterality Date   BREAST BIOPSY Left 06/23/2022   [place] LT BREAST BX W LOC DEV 1ST LESION IMG BX SPEC US GUIDE 06/23/2022 [facility] MAMMOGRAPHY   PORTACATH PLACEMENT Right 07/19/2022   Procedure: INSERTION PORT-A-CATH;  Surgeon: Griselda Miner, MD;  Location: [facility];  Service: General;  Laterality: Right;  60 MIN ROOM 8   WISDOM TOOTH EXTRACTION       Outpatient Encounter Medications as of 09/13/2022  Medication Sig   dexamethasone (DECADRON) 4 MG tablet Take 2 tablets (8 mg total) by mouth daily for 3 days. Start the day after doxorubicin/cyclophosphamide chemotherapy. Take with food.   FERROCITE 324 MG TABS tablet Take 1 tablet by mouth daily.   folic acid (FOLVITE) 1  MG tablet Take 1 mg by mouth daily.   ketoconazole (NIZORAL) 2 % shampoo Apply topically. (Patient not taking: Reported on 08/31/2022)   lidocaine-prilocaine (EMLA) cream Apply to affected area once   lidocaine-prilocaine (EMLA) cream Apply 1 Application topically as needed.    methimazole (TAPAZOLE) 5 MG tablet Take 15 mg by mouth every morning.   Multiple Vitamin (MULTIVITAMIN PO) Take by mouth.   omeprazole (PRILOSEC) 40 MG capsule Take 40 mg by mouth daily.   ondansetron (ZOFRAN) 8 MG tablet Take 1 tab (8 mg) by mouth every 8 hrs as needed for nausea/vomiting. Start third day after doxorubicin/cyclophosphamide chemotherapy.   oxyCODONE (ROXICODONE) 5 MG immediate release tablet Take 1 tablet (5 mg total) by mouth every 6 (six) hours as needed for severe pain. (Patient not taking: Reported on 08/31/2022)   prochlorperazine (COMPAZINE) 10 MG tablet Take 1 tablet (10 mg total) by mouth every 6 (six) hours as needed for nausea or vomiting.   No facility-administered encounter medications on file as of 09/13/2022.     There were no vitals filed for this visit. There is no height or weight on file to calculate BMI.   PHYSICAL EXAM GENERAL:alert, no distress and comfortable SKIN: no rash  EYES: sclera clear NECK: without mass LYMPH:  no palpable cervical or supraclavicular lymphadenopathy  LUNGS: clear with normal breathing effort HEART: regular rate & rhythm, no lower extremity edema ABDOMEN: abdomen soft, non-tender and normal bowel sounds NEURO: alert & oriented x 3 with fluent speech, no focal motor/sensory deficits Breast exam:  PAC without erythema    CBC    Component Value Date/Time   WBC 13.1 (H) 08/31/2022 1131   WBC 15.4 (H) 07/22/2022 1535   RBC 3.38 (L) 08/31/2022 1131   HGB 9.5 (L) 08/31/2022 1131   HCT 29.1 (L) 08/31/2022 1131   PLT 239 08/31/2022 1131   MCV 86.1 08/31/2022 1131   MCH 28.1 08/31/2022 1131   MCHC 32.6 08/31/2022 1131   RDW 17.9 (H) 08/31/2022 1131   LYMPHSABS 2.0 08/31/2022 1131   MONOABS 1.1 (H) 08/31/2022 1131   EOSABS 0.2 08/31/2022 1131   BASOSABS 0.2 (H) 08/31/2022 1131     CMP     Component Value Date/Time   NA 142 08/31/2022 1131   K 3.5 08/31/2022 1131   CL 109 08/31/2022 1131   CO2 26 08/31/2022 1131    GLUCOSE 117 (H) 08/31/2022 1131   BUN 8 08/31/2022 1131   CREATININE 0.75 08/31/2022 1131   CALCIUM 8.6 (L) 08/31/2022 1131   PROT 6.5 08/31/2022 1131   ALBUMIN 4.1 08/31/2022 1131   AST 18 08/31/2022 1131   ALT 15 08/31/2022 1131   ALKPHOS 108 08/31/2022 1131   BILITOT 0.2 (L) 08/31/2022 1131   GFRNONAA >60 08/31/2022 1131     ASSESSMENT & PLAN:  PLAN:  No orders of the defined types were placed in this encounter.     All questions were answered. The patient knows to call the clinic with any problems, questions or concerns. No barriers to learning were detected. I spent *** counseling the patient face to face. The total time spent in the appointment was *** and more than 50% was on counseling, review of test results, and coordination of care.   Santiago GladLacie Rahkeem Senft, NP-C @DATE @

## 2022-09-13 ENCOUNTER — Encounter: Payer: Self-pay | Admitting: Nurse Practitioner

## 2022-09-13 ENCOUNTER — Inpatient Hospital Stay: Payer: Managed Care, Other (non HMO) | Attending: Hematology and Oncology

## 2022-09-13 ENCOUNTER — Inpatient Hospital Stay (HOSPITAL_BASED_OUTPATIENT_CLINIC_OR_DEPARTMENT_OTHER): Payer: Managed Care, Other (non HMO) | Admitting: Nurse Practitioner

## 2022-09-13 ENCOUNTER — Inpatient Hospital Stay: Payer: Managed Care, Other (non HMO)

## 2022-09-13 VITALS — BP 113/74 | HR 74 | Temp 98.7°F | Resp 18

## 2022-09-13 DIAGNOSIS — Z5111 Encounter for antineoplastic chemotherapy: Secondary | ICD-10-CM | POA: Diagnosis not present

## 2022-09-13 DIAGNOSIS — C50412 Malignant neoplasm of upper-outer quadrant of left female breast: Secondary | ICD-10-CM | POA: Insufficient documentation

## 2022-09-13 DIAGNOSIS — Z170 Estrogen receptor positive status [ER+]: Secondary | ICD-10-CM

## 2022-09-13 DIAGNOSIS — Z95828 Presence of other vascular implants and grafts: Secondary | ICD-10-CM

## 2022-09-13 LAB — CBC WITH DIFFERENTIAL (CANCER CENTER ONLY)
Abs Immature Granulocytes: 1.1 10*3/uL — ABNORMAL HIGH (ref 0.00–0.07)
Band Neutrophils: 1 %
Basophils Absolute: 0.1 10*3/uL (ref 0.0–0.1)
Basophils Relative: 1 %
Eosinophils Absolute: 0.1 10*3/uL (ref 0.0–0.5)
Eosinophils Relative: 1 %
HCT: 28.5 % — ABNORMAL LOW (ref 36.0–46.0)
Hemoglobin: 9.5 g/dL — ABNORMAL LOW (ref 12.0–15.0)
Lymphocytes Relative: 10 %
Lymphs Abs: 1.2 10*3/uL (ref 0.7–4.0)
MCH: 28.2 pg (ref 26.0–34.0)
MCHC: 33.3 g/dL (ref 30.0–36.0)
MCV: 84.6 fL (ref 80.0–100.0)
Metamyelocytes Relative: 6 %
Monocytes Absolute: 0.7 10*3/uL (ref 0.1–1.0)
Monocytes Relative: 6 %
Myelocytes: 3 %
Neutro Abs: 8.8 10*3/uL — ABNORMAL HIGH (ref 1.7–7.7)
Neutrophils Relative %: 72 %
Platelet Count: 235 10*3/uL (ref 150–400)
RBC: 3.37 MIL/uL — ABNORMAL LOW (ref 3.87–5.11)
RDW: 18.6 % — ABNORMAL HIGH (ref 11.5–15.5)
WBC Count: 12 10*3/uL — ABNORMAL HIGH (ref 4.0–10.5)
nRBC: 0.7 % — ABNORMAL HIGH (ref 0.0–0.2)

## 2022-09-13 LAB — CMP (CANCER CENTER ONLY)
ALT: 12 U/L (ref 0–44)
AST: 15 U/L (ref 15–41)
Albumin: 4.2 g/dL (ref 3.5–5.0)
Alkaline Phosphatase: 118 U/L (ref 38–126)
Anion gap: 5 (ref 5–15)
BUN: 8 mg/dL (ref 6–20)
CO2: 28 mmol/L (ref 22–32)
Calcium: 9 mg/dL (ref 8.9–10.3)
Chloride: 107 mmol/L (ref 98–111)
Creatinine: 0.7 mg/dL (ref 0.44–1.00)
GFR, Estimated: >60 mL/min (ref >60)
Glucose, Bld: 99 mg/dL (ref 70–99)
Potassium: 3.7 mmol/L (ref 3.5–5.1)
Sodium: 140 mmol/L (ref 135–145)
Total Bilirubin: 0.2 mg/dL — ABNORMAL LOW (ref 0.3–1.2)
Total Protein: 6.6 g/dL (ref 6.5–8.1)

## 2022-09-13 MED ORDER — SODIUM CHLORIDE 0.9 % IV SOLN: Freq: Once | INTRAVENOUS | Status: AC

## 2022-09-13 MED ORDER — SODIUM CHLORIDE 0.9% FLUSH
10.0000 mL | INTRAVENOUS | Status: DC | PRN
  Administered 2022-09-13: 10 mL

## 2022-09-13 MED ORDER — SODIUM CHLORIDE 0.9 % IV SOLN
80.0000 mg/m2 | Freq: Once | INTRAVENOUS | Status: AC
  Administered 2022-09-13: 150 mg via INTRAVENOUS
  Filled 2022-09-13: qty 25

## 2022-09-13 MED ORDER — SODIUM CHLORIDE 0.9 % IV SOLN
10.0000 mg | Freq: Once | INTRAVENOUS | Status: AC
  Administered 2022-09-13: 10 mg via INTRAVENOUS
  Filled 2022-09-13: qty 10

## 2022-09-13 MED ORDER — SODIUM CHLORIDE 0.9% FLUSH
10.0000 mL | Freq: Once | INTRAVENOUS | Status: AC
  Administered 2022-09-13: 10 mL

## 2022-09-13 MED ORDER — FAMOTIDINE IN NACL 20-0.9 MG/50ML-% IV SOLN
20.0000 mg | Freq: Once | INTRAVENOUS | Status: AC
  Administered 2022-09-13: 20 mg via INTRAVENOUS
  Filled 2022-09-13: qty 50

## 2022-09-13 MED ORDER — PROCHLORPERAZINE MALEATE 10 MG PO TABS
10.0000 mg | ORAL_TABLET | Freq: Four times a day (QID) | ORAL | 1 refills | Status: DC | PRN
Start: 2022-09-13 — End: 2023-04-17

## 2022-09-13 MED ORDER — HEPARIN SOD (PORK) LOCK FLUSH 100 UNIT/ML IV SOLN
500.0000 [IU] | Freq: Once | INTRAVENOUS | Status: AC | PRN
  Administered 2022-09-13: 500 [IU]

## 2022-09-13 MED ORDER — DIPHENHYDRAMINE HCL 50 MG/ML IJ SOLN
50.0000 mg | Freq: Once | INTRAMUSCULAR | Status: AC
  Administered 2022-09-13: 50 mg via INTRAVENOUS
  Filled 2022-09-13: qty 1

## 2022-09-13 NOTE — Patient Instructions (Signed)
[facility]  Discharge Instructions: Thank you for choosing [facility] to provide your oncology and hematology care.   If you have a lab appointment with the Cancer Center, please go directly to the Cancer Center and check in at the registration area.   Wear comfortable clothing and clothing appropriate for easy access to any Portacath or PICC line.   We strive to give you quality time with your provider. You may need to reschedule your appointment if you arrive late (15 or more minutes).  Arriving late affects you and other patients whose appointments are after yours.  Also, if you miss three or more appointments without notifying the office, you may be dismissed from the clinic at the provider's discretion.      For prescription refill requests, have your pharmacy contact our office and allow 72 hours for refills to be completed.    Today you received the following chemotherapy and/or immunotherapy agents: paclitaxel      To help prevent nausea and vomiting after your treatment, we encourage you to take your nausea medication as directed.  BELOW ARE SYMPTOMS THAT SHOULD BE REPORTED IMMEDIATELY: *FEVER GREATER THAN 100.4 F (38 C) OR HIGHER *CHILLS OR SWEATING *NAUSEA AND VOMITING THAT IS NOT CONTROLLED WITH YOUR NAUSEA MEDICATION *UNUSUAL SHORTNESS OF BREATH *UNUSUAL BRUISING OR BLEEDING *URINARY PROBLEMS (pain or burning when urinating, or frequent urination) *BOWEL PROBLEMS (unusual diarrhea, constipation, pain near the anus) TENDERNESS IN MOUTH AND THROAT WITH OR WITHOUT PRESENCE OF ULCERS (sore throat, sores in mouth, or a toothache) UNUSUAL RASH, SWELLING OR PAIN  UNUSUAL VAGINAL DISCHARGE OR ITCHING   Items with * indicate a potential emergency and should be followed up as soon as possible or go to the Emergency Department if any problems should occur.  Please show the CHEMOTHERAPY ALERT CARD or IMMUNOTHERAPY ALERT CARD at  check-in to the Emergency Department and triage nurse.  Should you have questions after your visit or need to cancel or reschedule your appointment, please contact [facility]  Dept: [phone]  and follow the prompts.  Office hours are 8:00 a.m. to 4:30 p.m. Monday - Friday. Please note that voicemails left after 4:00 p.m. may not be returned until the following business day.  We are closed weekends and major holidays. You have access to a nurse at all times for urgent questions. Please call the main number to the clinic Dept: [phone] and follow the prompts.   For any non-urgent questions, you may also contact your provider using MyChart. We now offer e-Visits for anyone 18 and older to request care online for non-urgent symptoms. For details visit [email or web address].   Also download the MyChart app! Go to the app store, search "MyChart", open the app, select [facility], and log in with your MyChart username and password.   

## 2022-09-14 ENCOUNTER — Telehealth: Payer: Self-pay

## 2022-09-14 ENCOUNTER — Telehealth: Payer: Self-pay | Admitting: *Deleted

## 2022-09-14 NOTE — Telephone Encounter (Signed)
-----   Message from Edger House, RN sent at 09/13/2022  2:24 PM EDT ----- Regarding: First time taxol FENG First time paclitaxel. Dr. Mosetta Putt. Able to complete treatment without incident.

## 2022-09-14 NOTE — Telephone Encounter (Signed)
Notified Patient of completion of Cancer Deferment Form. Fax transmission confirmation received. Copy of form placed for pick-up as requested by Patient. No other needs or concerns voiced at this time.

## 2022-09-20 MED FILL — Dexamethasone Sodium Phosphate Inj 100 MG/10ML: INTRAMUSCULAR | Qty: 1 | Status: AC

## 2022-09-21 ENCOUNTER — Inpatient Hospital Stay: Payer: Managed Care, Other (non HMO) | Admitting: Physician Assistant

## 2022-09-21 ENCOUNTER — Inpatient Hospital Stay: Payer: Managed Care, Other (non HMO) | Admitting: Hematology and Oncology

## 2022-09-21 ENCOUNTER — Inpatient Hospital Stay: Payer: Managed Care, Other (non HMO)

## 2022-09-21 VITALS — BP 116/75 | HR 79 | Temp 97.9°F | Resp 16 | Ht 64.0 in | Wt 172.9 lb

## 2022-09-21 VITALS — BP 119/82 | HR 79 | Resp 17

## 2022-09-21 DIAGNOSIS — C50412 Malignant neoplasm of upper-outer quadrant of left female breast: Secondary | ICD-10-CM

## 2022-09-21 DIAGNOSIS — D6481 Anemia due to antineoplastic chemotherapy: Secondary | ICD-10-CM

## 2022-09-21 DIAGNOSIS — K5903 Drug induced constipation: Secondary | ICD-10-CM | POA: Diagnosis not present

## 2022-09-21 DIAGNOSIS — T451X5A Adverse effect of antineoplastic and immunosuppressive drugs, initial encounter: Secondary | ICD-10-CM | POA: Diagnosis not present

## 2022-09-21 DIAGNOSIS — Z5111 Encounter for antineoplastic chemotherapy: Secondary | ICD-10-CM | POA: Diagnosis not present

## 2022-09-21 DIAGNOSIS — Z95828 Presence of other vascular implants and grafts: Secondary | ICD-10-CM

## 2022-09-21 LAB — CBC WITH DIFFERENTIAL (CANCER CENTER ONLY)
Abs Immature Granulocytes: 0.06 10*3/uL (ref 0.00–0.07)
Basophils Absolute: 0.1 10*3/uL (ref 0.0–0.1)
Basophils Relative: 2 %
Eosinophils Absolute: 0.1 10*3/uL (ref 0.0–0.5)
Eosinophils Relative: 2 %
HCT: 28 % — ABNORMAL LOW (ref 36.0–46.0)
Hemoglobin: 9.1 g/dL — ABNORMAL LOW (ref 12.0–15.0)
Immature Granulocytes: 1 %
Lymphocytes Relative: 22 %
Lymphs Abs: 1.2 10*3/uL (ref 0.7–4.0)
MCH: 27.6 pg (ref 26.0–34.0)
MCHC: 32.5 g/dL (ref 30.0–36.0)
MCV: 84.8 fL (ref 80.0–100.0)
Monocytes Absolute: 0.6 10*3/uL (ref 0.1–1.0)
Monocytes Relative: 11 %
Neutro Abs: 3.5 10*3/uL (ref 1.7–7.7)
Neutrophils Relative %: 62 %
Platelet Count: 385 10*3/uL (ref 150–400)
RBC: 3.3 MIL/uL — ABNORMAL LOW (ref 3.87–5.11)
RDW: 18.6 % — ABNORMAL HIGH (ref 11.5–15.5)
WBC Count: 5.6 10*3/uL (ref 4.0–10.5)
nRBC: 0.4 % — ABNORMAL HIGH (ref 0.0–0.2)

## 2022-09-21 LAB — CMP (CANCER CENTER ONLY)
ALT: 19 U/L (ref 0–44)
AST: 19 U/L (ref 15–41)
Albumin: 4.1 g/dL (ref 3.5–5.0)
Alkaline Phosphatase: 70 U/L (ref 38–126)
Anion gap: 6 (ref 5–15)
BUN: 8 mg/dL (ref 6–20)
CO2: 26 mmol/L (ref 22–32)
Calcium: 8.8 mg/dL — ABNORMAL LOW (ref 8.9–10.3)
Chloride: 109 mmol/L (ref 98–111)
Creatinine: 0.74 mg/dL (ref 0.44–1.00)
GFR, Estimated: >60 mL/min (ref >60)
Glucose, Bld: 92 mg/dL (ref 70–99)
Potassium: 3.7 mmol/L (ref 3.5–5.1)
Sodium: 141 mmol/L (ref 135–145)
Total Bilirubin: 0.2 mg/dL — ABNORMAL LOW (ref 0.3–1.2)
Total Protein: 6.5 g/dL (ref 6.5–8.1)

## 2022-09-21 MED ORDER — METHYLPREDNISOLONE SODIUM SUCC 125 MG IJ SOLR
125.0000 mg | Freq: Once | INTRAMUSCULAR | Status: AC | PRN
  Administered 2022-09-21: 60 mg via INTRAVENOUS

## 2022-09-21 MED ORDER — FAMOTIDINE IN NACL 20-0.9 MG/50ML-% IV SOLN
20.0000 mg | Freq: Once | INTRAVENOUS | Status: AC
  Administered 2022-09-21: 20 mg via INTRAVENOUS
  Filled 2022-09-21: qty 50

## 2022-09-21 MED ORDER — SODIUM CHLORIDE 0.9 % IV SOLN: Freq: Once | INTRAVENOUS | Status: DC | PRN

## 2022-09-21 MED ORDER — SODIUM CHLORIDE 0.9 % IV SOLN: Freq: Once | INTRAVENOUS | Status: AC

## 2022-09-21 MED ORDER — SODIUM CHLORIDE 0.9% FLUSH
10.0000 mL | Freq: Once | INTRAVENOUS | Status: AC
  Administered 2022-09-21: 10 mL

## 2022-09-21 MED ORDER — SODIUM CHLORIDE 0.9 % IV SOLN
80.0000 mg/m2 | Freq: Once | INTRAVENOUS | Status: AC
  Administered 2022-09-21: 150 mg via INTRAVENOUS
  Filled 2022-09-21: qty 25

## 2022-09-21 MED ORDER — HEPARIN SOD (PORK) LOCK FLUSH 100 UNIT/ML IV SOLN
500.0000 [IU] | Freq: Once | INTRAVENOUS | Status: AC | PRN
  Administered 2022-09-21: 500 [IU]

## 2022-09-21 MED ORDER — SODIUM CHLORIDE 0.9% FLUSH
10.0000 mL | INTRAVENOUS | Status: DC | PRN
  Administered 2022-09-21: 10 mL

## 2022-09-21 MED ORDER — DIPHENHYDRAMINE HCL 50 MG/ML IJ SOLN
50.0000 mg | Freq: Once | INTRAMUSCULAR | Status: AC
  Administered 2022-09-21: 50 mg via INTRAVENOUS
  Filled 2022-09-21: qty 1

## 2022-09-21 MED ORDER — SODIUM CHLORIDE 0.9 % IV SOLN
10.0000 mg | Freq: Once | INTRAVENOUS | Status: AC
  Administered 2022-09-21: 10 mg via INTRAVENOUS
  Filled 2022-09-21: qty 10

## 2022-09-21 NOTE — Progress Notes (Signed)
Patient seen in infusion center during treatment today. She saw MD for office visit earlier today. For full HPI please see that note. Here for day 1 cycle 6 of taxol.  I evaluated patient for abdominal cramping during taxol infusion.  Near the end of her treatment patient began to develop sensation of bloating in pelvis.  She states this happened during her first treatment as well and resolved after she had a bowel movement.  Taxol was paused and pain did not resolve after bowel movement this time.  Patient with mild tenderness to palpation of right upper quadrant on exam.  She is hemodynamically stable.  Patient given 62.5 mg of IV Solu-Medrol.  Pain resolved and treatment was restarted.  Patient tolerated remainder of treatment without difficulty.  MD was informed of patient's symptoms.  Solu-Medrol will be added as a premedication for future treatments.   Vital signs: 119/82,  HR 79, Resp: 17, SpO2: 100%

## 2022-09-21 NOTE — Progress Notes (Signed)
[facility] Cancer Follow up:    Nicole Caffey, MD [address] [place] [address]   DIAGNOSIS:  Cancer Staging  Malignant neoplasm of upper-outer quadrant of left female breast Staging form: Breast, AJCC 8th Edition - Clinical stage from 06/28/2022: Stage IB (cT1c, cN0(f), cM0, G3, ER+, PR-, HER2-) - Signed by Ronny Bacon, PA-C on 06/28/2022 Stage prefix: Initial diagnosis Method of lymph node assessment: Core biopsy Histologic grading system: 3 grade system   SUMMARY OF ONCOLOGIC HISTORY: Oncology History  Malignant neoplasm of upper-outer quadrant of left female breast  06/28/2022 Initial Diagnosis   Primary malignant neoplasm of 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  The report date is July 05, 2022. Negative genetic testing on the Multi-cancer panel.  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  BLM c.3136G>A (p.Gly1046Ser), EGFR c.61G>A (p.Ala21Thr) and PDGFRA c.50G>T (p.Gly17Val) VUS identified.  The report date is July 11, 2022.  The STAT Breast cancer panel offered by [facility] includes sequencing and rearrangement analysis for the following 9 genes:  ATM, BRCA1, BRCA2, CDH1, CHEK2, PALB2, PTEN, STK11 and TP53.   The Multi-Cancer + RNA Panel offered by [facility] includes sequencing and/or deletion/duplication analysis of the following 70 genes:  AIP*, ALK, APC*, ATM*, AXIN2*, BAP1*, BARD1*, BLM*, BMPR1A*, BRCA1*, BRCA2*, BRIP1*, CDC73*, CDH1*, CDK4, CDKN1B*, CDKN2A, CHEK2*, CTNNA1*, DICER1*, EPCAM (del/dup only), EGFR, FH*, FLCN*, GREM1 (promoter  dup only), HOXB13, KIT, LZTR1, MAX*, MBD4, MEN1*, MET, MITF, MLH1*, MSH2*, MSH3*, MSH6*, MUTYH*, NF1*, NF2*, NTHL1*, PALB2*, PDGFRA, PMS2*, POLD1*, POLE*, POT1*, PRKAR1A*, PTCH1*, PTEN*, RAD51C*, RAD51D*, RB1*, RET, SDHA* (sequencing only), SDHAF2*, SDHB*, SDHC*, SDHD*, SMAD4*, SMARCA4*, SMARCB1*, SMARCE1*, STK11*, SUFU*, TMEM127*, TP53*, TSC1*, TSC2*, VHL*. RNA analysis is performed for * genes.    07/20/2022 -  Chemotherapy   Patient is on Treatment Plan : BREAST ADJUVANT DOSE DENSE AC q14d / PACLitaxel q7d       CURRENT THERAPY: Neoadjuvant Adriamycin and Cytoxan  INTERVAL HISTORY:  Nicole Maddox 42 y.o. female returns for follow-up and evaluation prior to receiving weekly taxol. She reports some fatigue, aches and pains, constipation, occasional lower back pain and mild skin rash For constipation, she is using Mg citrate as needed. She is following up with dermatology for skin rash No neuropathy reported. She doesn't feel her breast mass anymore. Rest of the pertinent 10 point ROS reviewed and neg.   Patient Active Problem List   Diagnosis Date Noted   Port-A-Cath in place 07/20/2022   Genetic testing 07/07/2022   Family history of stomach cancer 06/29/2022   Malignant neoplasm of upper-outer quadrant of left female breast 06/27/2022   Seborrheic dermatitis of scalp 02/25/2018    is allergic to adriamycin [doxorubicin].  MEDICAL HISTORY: Past Medical History:  Diagnosis Date   Breast cancer 06/23/2022   Family history of stomach cancer    GERD (gastroesophageal reflux disease)    Hyperthyroidism    Thyroid disease     SURGICAL HISTORY: Past Surgical History:  Procedure Laterality Date   BREAST BIOPSY Left 06/23/2022   [place] LT BREAST BX W LOC DEV 1ST LESION IMG  BX SPEC US GUIDE 06/23/2022 [facility] MAMMOGRAPHY   PORTACATH PLACEMENT Right 07/19/2022   Procedure: INSERTION PORT-A-CATH;  Surgeon: Griselda Miner, MD;  Location: [facility];  Service: General;   Laterality: Right;  60 MIN ROOM 8   WISDOM TOOTH EXTRACTION      SOCIAL HISTORY: Social History   Socioeconomic History   Marital status: Single    Spouse name: Not on file   Number of children: Not on file   Years of education: Not on file   Highest education level: Not on file  Occupational History   Not on file  Tobacco Use   Smoking status: Never   Smokeless tobacco: Never  Vaping Use   Vaping Use: Never used  Substance and Sexual Activity   Alcohol use: Yes    Comment: socially   Drug use: No   Sexual activity: Yes    Partners: Male    Birth control/protection: None    Comment: intercourse age 57, more than 6 sexual parters,   Other Topics Concern   Not on file  Social History Narrative   Not on file   Social Determinants of Health   Financial Resource Strain: Low Risk  (07/04/2022)   Overall Financial Resource Strain (CARDIA)    Difficulty of Paying Living Expenses: Not very hard  Food Insecurity: No Food Insecurity (06/28/2022)   Hunger Vital Sign    Worried About Running Out of Food in the Last Year: Never true    Ran Out of Food in the Last Year: Never true  Transportation Needs: No Transportation Needs (06/28/2022)   PRAPARE - Administrator, Civil Service (Medical): No    Lack of Transportation (Non-Medical): No  Physical Activity: Not on file  Stress: Not on file  Social Connections: Not on file  Intimate Partner Violence: Not At Risk (06/28/2022)   Humiliation, Afraid, Rape, and Kick questionnaire    Fear of Current or Ex-Partner: No    Emotionally Abused: No    Physically Abused: No    Sexually Abused: No    FAMILY HISTORY: Family History  Problem Relation Age of Onset   Heart attack Father 45   Stomach cancer Maternal Aunt        dx > 50   Throat cancer Maternal Uncle        dx 79s   Stomach cancer Maternal Uncle        dx > 50   Heart Problems Maternal Grandmother    Breast cancer Cousin        mother's maternal first cousin     Review of Systems  Constitutional:  Positive for fatigue (Mild). Negative for appetite change, chills, fever and unexpected weight change.  HENT:   Negative for hearing loss, lump/mass and trouble swallowing.   Eyes:  Negative for eye problems and icterus.  Respiratory:  Negative for chest tightness, cough and shortness of breath.   Cardiovascular:  Negative for chest pain, leg swelling and palpitations.  Gastrointestinal:  Negative for abdominal distention, abdominal pain, constipation, diarrhea, nausea and vomiting.  Endocrine: Negative for hot flashes.  Genitourinary:  Negative for difficulty urinating.   Musculoskeletal:  Negative for arthralgias.  Skin:  Negative for itching and rash.  Neurological:  Negative for dizziness, extremity weakness, headaches and numbness.  Hematological:  Negative for adenopathy. Does not bruise/bleed easily.  Psychiatric/Behavioral:  Negative for depression. The patient is not nervous/anxious.       PHYSICAL EXAMINATION  ECOG PERFORMANCE STATUS:  1 - Symptomatic but completely ambulatory  Vitals:   09/21/22 0900  BP: 116/75  Pulse: 79  Resp: 16  Temp: 97.9 F (36.6 C)  SpO2: 100%   Physical Exam Constitutional:      Appearance: Normal appearance.  Chest:     Comments: No palpable left breast upper outer quadrant mass. There is however an area of abnormal density in the are of previous abnormality. No regional adenopathy Musculoskeletal:     Cervical back: Normal range of motion and neck supple. No rigidity.  Lymphadenopathy:     Cervical: No cervical adenopathy.  Neurological:     Mental Status: She is alert.      LABORATORY DATA:  CBC    Component Value Date/Time   WBC 5.6 09/21/2022 0830   WBC 15.4 (H) 07/22/2022 1535   RBC 3.30 (L) 09/21/2022 0830   HGB 9.1 (L) 09/21/2022 0830   HCT 28.0 (L) 09/21/2022 0830   PLT 385 09/21/2022 0830   MCV 84.8 09/21/2022 0830   MCH 27.6 09/21/2022 0830   MCHC 32.5 09/21/2022 0830    RDW 18.6 (H) 09/21/2022 0830   LYMPHSABS 1.2 09/21/2022 0830   MONOABS 0.6 09/21/2022 0830   EOSABS 0.1 09/21/2022 0830   BASOSABS 0.1 09/21/2022 0830    CMP     Component Value Date/Time   NA 141 09/21/2022 0830   K 3.7 09/21/2022 0830   CL 109 09/21/2022 0830   CO2 26 09/21/2022 0830   GLUCOSE 92 09/21/2022 0830   BUN 8 09/21/2022 0830   CREATININE 0.74 09/21/2022 0830   CALCIUM 8.8 (L) 09/21/2022 0830   PROT 6.5 09/21/2022 0830   ALBUMIN 4.1 09/21/2022 0830   AST 19 09/21/2022 0830   ALT 19 09/21/2022 0830   ALKPHOS 70 09/21/2022 0830   BILITOT 0.2 (L) 09/21/2022 0830   GFRNONAA >60 09/21/2022 0830      ASSESSMENT and THERAPY PLAN:   Malignant neoplasm of upper-outer quadrant of left female breast (HCC) This is a very pleasant 42 year old female patient, premenopausal with 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  She denies any family history of breast cancer or ovarian cancer in the immediate family, may have a second cousin with breast cancer. We think she will be an excellent candidate for neoadjuvant therapy given large tumor size despite no definitive evidence of lymph node involvement especially with her high-grade, high proliferation index invasive ductal carcinoma which is functionally triple negative.I have discussed the regimen very clearly, mechanism of action of chemo, adverse effects including but not limited to fatigue, nausea, vomiting, diarrhea, increased risk of infections, neuropathy, cardiotoxicity.  She has now completed AC portion of chemo and is on weekly taxol. PE near complete response, some abnormal density in the area of the breast mass. She is tolerating chemo very well except for some minor issues. No concerns on exam Labs satisfactory to proceed today  # 2 Drug induced constipation, ok to use PRN  stool softeners and laxatives  #3 Anemia secondary to chemotherapy, no indication to transfuse, will continue to monitor.   All questions were answered. The patient knows to call the clinic with any problems, questions or concerns. We can certainly see the patient much sooner if necessary.  Total encounter time:30 minutes*in face-to-face visit time, chart review, lab review, care coordination, order entry, and documentation of the encounter time.   *Total Encounter Time  as defined by the Centers for Medicare and Medicaid Services includes, in addition to the face-to-face time of a patient visit (documented in the note above) non-face-to-face time: obtaining and reviewing outside history, ordering and reviewing medications, tests or procedures, care coordination (communications with other health care professionals or caregivers) and documentation in the medical record.

## 2022-09-21 NOTE — Progress Notes (Signed)
Pt states pain 0/10.  Offered warm compress for comfort and pt declined

## 2022-09-21 NOTE — Progress Notes (Signed)
Late note 1330- pt started c/o lower abd pain 10/10 on scale with tears.  Jae Dire PA paged, Taxol paused.  1335Jae Dire PA at bedside, pt continues to c/o abd pain more on RUQ/umbilical to touch and states "feels like I need to poop"  1340- Solumedrol IV given per verbal Jae Dire PA  1350- Taxol restarted per Jae Dire PA and pt tolerating well.

## 2022-09-21 NOTE — Progress Notes (Addendum)
Placed order for Solumedrol 60 mg IV PRN  pelvic cramping sensations per MD Iruku.  Demetrius Charity, PharmD

## 2022-09-21 NOTE — Assessment & Plan Note (Addendum)
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  She denies any family history of breast cancer or ovarian cancer in the immediate family, may have a second cousin with breast cancer. We think she will be an excellent candidate for neoadjuvant therapy given large tumor size despite no definitive evidence of lymph node involvement especially with her high-grade, high proliferation index invasive ductal carcinoma which is functionally triple negative.I have discussed the regimen very clearly, mechanism of action of chemo, adverse effects including but not limited to fatigue, nausea, vomiting, diarrhea, increased risk of infections, neuropathy, cardiotoxicity.  She has now completed AC portion of chemo and is on weekly taxol. PE near complete response, some abnormal density in the area of the breast mass. She is tolerating chemo very well except for some minor issues. No concerns on exam Labs satisfactory to proceed today  # 2 Drug induced constipation, ok to use PRN stool softeners and laxatives  #3 Anemia secondary to chemotherapy, no indication to transfuse, will continue to monitor.

## 2022-09-22 ENCOUNTER — Telehealth: Payer: Self-pay | Admitting: Hematology and Oncology

## 2022-09-22 NOTE — Telephone Encounter (Signed)
Spoke with patient confirming upcoming appointments  

## 2022-09-23 ENCOUNTER — Other Ambulatory Visit: Payer: Self-pay

## 2022-09-25 ENCOUNTER — Other Ambulatory Visit: Payer: Self-pay | Admitting: Hematology and Oncology

## 2022-09-25 DIAGNOSIS — C50412 Malignant neoplasm of upper-outer quadrant of left female breast: Secondary | ICD-10-CM

## 2022-09-26 ENCOUNTER — Encounter: Payer: Self-pay | Admitting: Hematology and Oncology

## 2022-09-27 MED FILL — Dexamethasone Sodium Phosphate Inj 100 MG/10ML: INTRAMUSCULAR | Qty: 1 | Status: AC

## 2022-09-28 ENCOUNTER — Inpatient Hospital Stay: Payer: Managed Care, Other (non HMO) | Admitting: Nurse Practitioner

## 2022-09-28 ENCOUNTER — Other Ambulatory Visit: Payer: Self-pay

## 2022-09-28 ENCOUNTER — Inpatient Hospital Stay: Payer: Managed Care, Other (non HMO)

## 2022-09-28 ENCOUNTER — Encounter: Payer: Self-pay | Admitting: Adult Health

## 2022-09-28 ENCOUNTER — Inpatient Hospital Stay: Payer: Managed Care, Other (non HMO) | Admitting: Adult Health

## 2022-09-28 DIAGNOSIS — Z170 Estrogen receptor positive status [ER+]: Secondary | ICD-10-CM

## 2022-09-28 DIAGNOSIS — C50412 Malignant neoplasm of upper-outer quadrant of left female breast: Secondary | ICD-10-CM

## 2022-09-28 DIAGNOSIS — Z95828 Presence of other vascular implants and grafts: Secondary | ICD-10-CM

## 2022-09-28 DIAGNOSIS — Z5111 Encounter for antineoplastic chemotherapy: Secondary | ICD-10-CM | POA: Diagnosis not present

## 2022-09-28 LAB — CMP (CANCER CENTER ONLY)
ALT: 17 U/L (ref 0–44)
AST: 17 U/L (ref 15–41)
Albumin: 4 g/dL (ref 3.5–5.0)
Alkaline Phosphatase: 55 U/L (ref 38–126)
Anion gap: 5 (ref 5–15)
BUN: 12 mg/dL (ref 6–20)
CO2: 27 mmol/L (ref 22–32)
Calcium: 9.1 mg/dL (ref 8.9–10.3)
Chloride: 107 mmol/L (ref 98–111)
Creatinine: 0.72 mg/dL (ref 0.44–1.00)
GFR, Estimated: >60 mL/min (ref >60)
Glucose, Bld: 85 mg/dL (ref 70–99)
Potassium: 4 mmol/L (ref 3.5–5.1)
Sodium: 139 mmol/L (ref 135–145)
Total Bilirubin: 0.2 mg/dL — ABNORMAL LOW (ref 0.3–1.2)
Total Protein: 6.3 g/dL — ABNORMAL LOW (ref 6.5–8.1)

## 2022-09-28 LAB — CBC WITH DIFFERENTIAL (CANCER CENTER ONLY)
Abs Immature Granulocytes: 0.03 10*3/uL (ref 0.00–0.07)
Basophils Absolute: 0.1 10*3/uL (ref 0.0–0.1)
Basophils Relative: 2 %
Eosinophils Absolute: 0.2 10*3/uL (ref 0.0–0.5)
Eosinophils Relative: 5 %
HCT: 27.9 % — ABNORMAL LOW (ref 36.0–46.0)
Hemoglobin: 9 g/dL — ABNORMAL LOW (ref 12.0–15.0)
Immature Granulocytes: 1 %
Lymphocytes Relative: 28 %
Lymphs Abs: 1.2 10*3/uL (ref 0.7–4.0)
MCH: 27.6 pg (ref 26.0–34.0)
MCHC: 32.3 g/dL (ref 30.0–36.0)
MCV: 85.6 fL (ref 80.0–100.0)
Monocytes Absolute: 0.5 10*3/uL (ref 0.1–1.0)
Monocytes Relative: 11 %
Neutro Abs: 2.4 10*3/uL (ref 1.7–7.7)
Neutrophils Relative %: 53 %
Platelet Count: 417 10*3/uL — ABNORMAL HIGH (ref 150–400)
RBC: 3.26 MIL/uL — ABNORMAL LOW (ref 3.87–5.11)
RDW: 19.3 % — ABNORMAL HIGH (ref 11.5–15.5)
WBC Count: 4.4 10*3/uL (ref 4.0–10.5)
nRBC: 0 % (ref 0.0–0.2)

## 2022-09-28 MED ORDER — HEPARIN SOD (PORK) LOCK FLUSH 100 UNIT/ML IV SOLN
500.0000 [IU] | Freq: Once | INTRAVENOUS | Status: AC | PRN
  Administered 2022-09-28: 500 [IU]

## 2022-09-28 MED ORDER — SODIUM CHLORIDE 0.9% FLUSH
10.0000 mL | INTRAVENOUS | Status: DC | PRN
  Administered 2022-09-28: 10 mL

## 2022-09-28 MED ORDER — METHYLPREDNISOLONE SODIUM SUCC 125 MG IJ SOLR
60.0000 mg | INTRAMUSCULAR | Status: DC | PRN
  Administered 2022-09-28: 60 mg via INTRAVENOUS
  Filled 2022-09-28: qty 2

## 2022-09-28 MED ORDER — SODIUM CHLORIDE 0.9 % IV SOLN: Freq: Once | INTRAVENOUS | Status: AC

## 2022-09-28 MED ORDER — SODIUM CHLORIDE 0.9 % IV SOLN
10.0000 mg | Freq: Once | INTRAVENOUS | Status: AC
  Administered 2022-09-28: 10 mg via INTRAVENOUS
  Filled 2022-09-28: qty 10

## 2022-09-28 MED ORDER — SODIUM CHLORIDE 0.9 % IV SOLN
80.0000 mg/m2 | Freq: Once | INTRAVENOUS | Status: AC
  Administered 2022-09-28: 150 mg via INTRAVENOUS
  Filled 2022-09-28: qty 25

## 2022-09-28 MED ORDER — FAMOTIDINE IN NACL 20-0.9 MG/50ML-% IV SOLN
20.0000 mg | Freq: Once | INTRAVENOUS | Status: AC
  Administered 2022-09-28: 20 mg via INTRAVENOUS
  Filled 2022-09-28: qty 50

## 2022-09-28 MED ORDER — SODIUM CHLORIDE 0.9% FLUSH
10.0000 mL | Freq: Once | INTRAVENOUS | Status: AC
  Administered 2022-09-28: 10 mL

## 2022-09-28 MED ORDER — DIPHENHYDRAMINE HCL 50 MG/ML IJ SOLN
50.0000 mg | Freq: Once | INTRAMUSCULAR | Status: AC
  Administered 2022-09-28: 50 mg via INTRAVENOUS
  Filled 2022-09-28: qty 1

## 2022-09-28 NOTE — Patient Instructions (Signed)
[facility]  Discharge Instructions: Thank you for choosing [facility] to provide your oncology and hematology care.   If you have a lab appointment with the Cancer Center, please go directly to the Cancer Center and check in at the registration area.   Wear comfortable clothing and clothing appropriate for easy access to any Portacath or PICC line.   We strive to give you quality time with your provider. You may need to reschedule your appointment if you arrive late (15 or more minutes).  Arriving late affects you and other patients whose appointments are after yours.  Also, if you miss three or more appointments without notifying the office, you may be dismissed from the clinic at the provider's discretion.      For prescription refill requests, have your pharmacy contact our office and allow 72 hours for refills to be completed.    Today you received the following chemotherapy and/or immunotherapy agents: paclitaxel      To help prevent nausea and vomiting after your treatment, we encourage you to take your nausea medication as directed.  BELOW ARE SYMPTOMS THAT SHOULD BE REPORTED IMMEDIATELY: *FEVER GREATER THAN 100.4 F (38 C) OR HIGHER *CHILLS OR SWEATING *NAUSEA AND VOMITING THAT IS NOT CONTROLLED WITH YOUR NAUSEA MEDICATION *UNUSUAL SHORTNESS OF BREATH *UNUSUAL BRUISING OR BLEEDING *URINARY PROBLEMS (pain or burning when urinating, or frequent urination) *BOWEL PROBLEMS (unusual diarrhea, constipation, pain near the anus) TENDERNESS IN MOUTH AND THROAT WITH OR WITHOUT PRESENCE OF ULCERS (sore throat, sores in mouth, or a toothache) UNUSUAL RASH, SWELLING OR PAIN  UNUSUAL VAGINAL DISCHARGE OR ITCHING   Items with * indicate a potential emergency and should be followed up as soon as possible or go to the Emergency Department if any problems should occur.  Please show the CHEMOTHERAPY ALERT CARD or IMMUNOTHERAPY ALERT CARD at  check-in to the Emergency Department and triage nurse.  Should you have questions after your visit or need to cancel or reschedule your appointment, please contact [facility]  Dept: [phone]  and follow the prompts.  Office hours are 8:00 a.m. to 4:30 p.m. Monday - Friday. Please note that voicemails left after 4:00 p.m. may not be returned until the following business day.  We are closed weekends and major holidays. You have access to a nurse at all times for urgent questions. Please call the main number to the clinic Dept: [phone] and follow the prompts.   For any non-urgent questions, you may also contact your provider using MyChart. We now offer e-Visits for anyone 18 and older to request care online for non-urgent symptoms. For details visit [email or web address].   Also download the MyChart app! Go to the app store, search "MyChart", open the app, select , and log in with your MyChart username and password.   

## 2022-09-28 NOTE — Assessment & Plan Note (Addendum)
Nicole Maddox is a 42 year old woman with 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  Treatment Plan: Neoadjuvant chemotherapy with Adriamycin/Cytoxan every 2 weeks x 4 followed by weekly Taxol x 12.  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  I recommended that she keep a close eye on it this week to tell us exactly how often she is experiencing it and for how long.  She will proceed with treatment today.  We will see her back in 1 week for labs, follow-up, and her next Taxol.

## 2022-09-28 NOTE — Progress Notes (Signed)
[facility] Cancer Follow up:    Nicole Caffey, MD [address] [place] [address]   DIAGNOSIS:  Cancer Staging  Malignant neoplasm of upper-outer quadrant of left female breast Staging form: Breast, AJCC 8th Edition - Clinical stage from 06/28/2022: Stage IB (cT1c, cN0(f), cM0, G3, ER+, PR-, HER2-) - Signed by Ronny Bacon, PA-C on 06/28/2022 Stage prefix: Initial diagnosis Method of lymph node assessment: Core biopsy Histologic grading system: 3 grade system   SUMMARY OF ONCOLOGIC HISTORY: Oncology History  Malignant neoplasm of upper-outer quadrant of left female breast  06/28/2022 Initial Diagnosis   Primary malignant neoplasm of 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  The report date is July 05, 2022. Negative genetic testing on the Multi-cancer panel.  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  BLM c.3136G>A (p.Gly1046Ser), EGFR c.61G>A (p.Ala21Thr) and PDGFRA c.50G>T (p.Gly17Val) VUS identified.  The report date is July 11, 2022.  The STAT Breast cancer panel offered by [facility] includes sequencing and rearrangement analysis for the following 9 genes:  ATM, BRCA1, BRCA2, CDH1, CHEK2, PALB2, PTEN, STK11 and TP53.   The Multi-Cancer + RNA Panel offered by [facility] includes sequencing and/or deletion/duplication analysis of the following 70 genes:  AIP*, ALK, APC*, ATM*, AXIN2*, BAP1*, BARD1*, BLM*, BMPR1A*, BRCA1*, BRCA2*, BRIP1*, CDC73*, CDH1*, CDK4, CDKN1B*, CDKN2A, CHEK2*, CTNNA1*, DICER1*, EPCAM (del/dup only), EGFR, FH*, FLCN*, GREM1 (promoter  dup only), HOXB13, KIT, LZTR1, MAX*, MBD4, MEN1*, MET, MITF, MLH1*, MSH2*, MSH3*, MSH6*, MUTYH*, NF1*, NF2*, NTHL1*, PALB2*, PDGFRA, PMS2*, POLD1*, POLE*, POT1*, PRKAR1A*, PTCH1*, PTEN*, RAD51C*, RAD51D*, RB1*, RET, SDHA* (sequencing only), SDHAF2*, SDHB*, SDHC*, SDHD*, SMAD4*, SMARCA4*, SMARCB1*, SMARCE1*, STK11*, SUFU*, TMEM127*, TP53*, TSC1*, TSC2*, VHL*. RNA analysis is performed for * genes.    07/20/2022 -  Chemotherapy   Patient is on Treatment Plan : BREAST ADJUVANT DOSE DENSE AC q14d / PACLitaxel q7d       CURRENT THERAPY: Weekly Taxol  INTERVAL HISTORY: Nicole Maddox 42 y.o. female returns for follow-up of her weekly Taxol.  She is tolerating this moderately well.  She tells me that when she takes a shower she feels some occasional tingling in her fingertips.  Otherwise she does not notice this.  She has some appetite changes but otherwise denies any significant issues including fever chills nausea vomiting headaches mouth sores.   Patient Active Problem List   Diagnosis Date Noted   Port-A-Cath in place 07/20/2022   Genetic testing 07/07/2022   Family history of stomach cancer 06/29/2022   Malignant neoplasm of upper-outer quadrant of left female breast 06/27/2022   Seborrheic dermatitis of scalp 02/25/2018    is allergic to adriamycin [doxorubicin].  MEDICAL HISTORY: Past Medical History:  Diagnosis Date   Breast cancer 06/23/2022   Family history of stomach cancer    GERD (gastroesophageal reflux disease)    Hyperthyroidism    Thyroid disease     SURGICAL HISTORY: Past Surgical History:  Procedure Laterality Date   BREAST BIOPSY Left 06/23/2022   [place] LT BREAST BX W LOC DEV 1ST LESION IMG BX SPEC US GUIDE 06/23/2022 [facility] MAMMOGRAPHY  PORTACATH PLACEMENT Right 07/19/2022   Procedure: INSERTION PORT-A-CATH;  Surgeon: Griselda Miner, MD;  Location: [facility];  Service: General;  Laterality: Right;  60 MIN ROOM 8   WISDOM TOOTH EXTRACTION       SOCIAL HISTORY: Social History   Socioeconomic History   Marital status: Single    Spouse name: Not on file   Number of children: Not on file   Years of education: Not on file   Highest education level: Not on file  Occupational History   Not on file  Tobacco Use   Smoking status: Never   Smokeless tobacco: Never  Vaping Use   Vaping Use: Never used  Substance and Sexual Activity   Alcohol use: Yes    Comment: socially   Drug use: No   Sexual activity: Yes    Partners: Male    Birth control/protection: None    Comment: intercourse age 36, more than 6 sexual parters,   Other Topics Concern   Not on file  Social History Narrative   Not on file   Social Determinants of Health   Financial Resource Strain: Low Risk  (07/04/2022)   Overall Financial Resource Strain (CARDIA)    Difficulty of Paying Living Expenses: Not very hard  Food Insecurity: No Food Insecurity (06/28/2022)   Hunger Vital Sign    Worried About Running Out of Food in the Last Year: Never true    Ran Out of Food in the Last Year: Never true  Transportation Needs: No Transportation Needs (06/28/2022)   PRAPARE - Administrator, Civil Service (Medical): No    Lack of Transportation (Non-Medical): No  Physical Activity: Not on file  Stress: Not on file  Social Connections: Not on file  Intimate Partner Violence: Not At Risk (06/28/2022)   Humiliation, Afraid, Rape, and Kick questionnaire    Fear of Current or Ex-Partner: No    Emotionally Abused: No    Physically Abused: No    Sexually Abused: No    FAMILY HISTORY: Family History  Problem Relation Age of Onset   Heart attack Father 80   Stomach cancer Maternal Aunt        dx > 50   Throat cancer Maternal Uncle        dx 41s   Stomach cancer Maternal Uncle        dx > 50   Heart Problems Maternal Grandmother    Breast cancer Cousin        mother's maternal first cousin    Review of Systems  Constitutional:  Positive for  appetite change and fatigue. Negative for chills, fever and unexpected weight change.  HENT:   Negative for hearing loss, lump/mass and trouble swallowing.   Eyes:  Negative for eye problems and icterus.  Respiratory:  Negative for chest tightness, cough and shortness of breath.   Cardiovascular:  Negative for chest pain, leg swelling and palpitations.  Gastrointestinal:  Negative for abdominal distention, abdominal pain, constipation, diarrhea, nausea and vomiting.  Endocrine: Negative for hot flashes.  Genitourinary:  Negative for difficulty urinating.   Musculoskeletal:  Negative for arthralgias.  Skin:  Negative for itching and rash.  Neurological:  Negative for dizziness, extremity weakness, headaches and numbness.  Hematological:  Negative for adenopathy. Does not bruise/bleed easily.  Psychiatric/Behavioral:  Negative for depression. The patient is not nervous/anxious.       PHYSICAL EXAMINATION   Onc Performance Status - 09/28/22 1223  ECOG Perf Status   ECOG Perf Status Restricted in physically strenuous activity but ambulatory and able to carry out work of a light or sedentary nature, e.g., light house work, office work      KPS SCALE   KPS % SCORE Able to carry on normal activity, minor s/s of disease             Vitals:   09/28/22 1205  BP: 112/73  Pulse: 84  Resp: 18  Temp: 97.8 F (36.6 C)  SpO2: 100%    Physical Exam Constitutional:      General: She is not in acute distress.    Appearance: Normal appearance. She is not toxic-appearing.  HENT:     Head: Normocephalic and atraumatic.  Eyes:     General: No scleral icterus. Cardiovascular:     Rate and Rhythm: Normal rate and regular rhythm.     Pulses: Normal pulses.     Heart sounds: Normal heart sounds.  Pulmonary:     Effort: Pulmonary effort is normal.     Breath sounds: Normal breath sounds.  Abdominal:     General: Abdomen is flat. Bowel sounds are normal. There is no distension.      Palpations: Abdomen is soft.     Tenderness: There is no abdominal tenderness.  Musculoskeletal:        General: No swelling.     Cervical back: Neck supple.  Lymphadenopathy:     Cervical: No cervical adenopathy.  Skin:    General: Skin is warm and dry.     Findings: No rash.  Neurological:     General: No focal deficit present.     Mental Status: She is alert.  Psychiatric:        Mood and Affect: Mood normal.        Behavior: Behavior normal.     LABORATORY DATA:  CBC    Component Value Date/Time   WBC 4.4 09/28/2022 1145   WBC 15.4 (H) 07/22/2022 1535   RBC 3.26 (L) 09/28/2022 1145   HGB 9.0 (L) 09/28/2022 1145   HCT 27.9 (L) 09/28/2022 1145   PLT 417 (H) 09/28/2022 1145   MCV 85.6 09/28/2022 1145   MCH 27.6 09/28/2022 1145   MCHC 32.3 09/28/2022 1145   RDW 19.3 (H) 09/28/2022 1145   LYMPHSABS 1.2 09/28/2022 1145   MONOABS 0.5 09/28/2022 1145   EOSABS 0.2 09/28/2022 1145   BASOSABS 0.1 09/28/2022 1145    CMP     Component Value Date/Time   NA 141 09/21/2022 0830   K 3.7 09/21/2022 0830   CL 109 09/21/2022 0830   CO2 26 09/21/2022 0830   GLUCOSE 92 09/21/2022 0830   BUN 8 09/21/2022 0830   CREATININE 0.74 09/21/2022 0830   CALCIUM 8.8 (L) 09/21/2022 0830   PROT 6.5 09/21/2022 0830   ALBUMIN 4.1 09/21/2022 0830   AST 19 09/21/2022 0830   ALT 19 09/21/2022 0830   ALKPHOS 70 09/21/2022 0830   BILITOT 0.2 (L) 09/21/2022 0830   GFRNONAA >60 09/21/2022 0830       ASSESSMENT and THERAPY PLAN:   Malignant neoplasm of upper-outer quadrant of left female breast (HCC) Jenevieve is a 42 year old woman with 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  Treatment Plan: Neoadjuvant chemotherapy with Adriamycin/Cytoxan every 2 weeks x 4 followed by weekly Taxol x 12.  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  I recommended that she keep a close eye on it this week to tell us exactly how often she is experiencing it and for how long.  She will proceed with treatment today.  We will see her back in 1 week for labs, follow-up, and her next Taxol.   All questions were answered. The patient knows to call the clinic with any problems, questions or concerns. We can certainly see the patient much sooner if necessary.  Total encounter time:20 minutes*in face-to-face visit time, chart review, lab review, care coordination, order entry, and documentation of the encounter time.    Nicole Anes, Nicole Maddox 09/28/22 12:43 PM Medical Oncology and Hematology [facility] [address] [place], [place] [address] Tel. [phone]    Fax. [phone]  *Total Encounter Time as defined by the Centers for Medicare and Medicaid Services includes, in addition to the face-to-face time of a patient visit (documented in the note above) non-face-to-face time: obtaining and reviewing outside history, ordering and reviewing medications, tests or procedures, care coordination (communications with other health care professionals or caregivers) and documentation in the medical record.

## 2022-10-03 ENCOUNTER — Encounter: Payer: Self-pay | Admitting: Plastic Surgery

## 2022-10-03 ENCOUNTER — Ambulatory Visit (INDEPENDENT_AMBULATORY_CARE_PROVIDER_SITE_OTHER): Payer: Managed Care, Other (non HMO) | Admitting: Plastic Surgery

## 2022-10-03 VITALS — BP 115/72 | HR 105

## 2022-10-03 DIAGNOSIS — C50412 Malignant neoplasm of upper-outer quadrant of left female breast: Secondary | ICD-10-CM | POA: Diagnosis not present

## 2022-10-03 DIAGNOSIS — Z170 Estrogen receptor positive status [ER+]: Secondary | ICD-10-CM

## 2022-10-03 NOTE — Progress Notes (Signed)
   Subjective:    Patient ID: Nicole Maddox, female    DOB: 07/11/1980, 42 y.o.   MRN: [id number]  The patient is a 42 year old female here for follow-up on breast reconstruction.  She is a patient of Dr. Carolynne Edouard and was diagnosed with a left upper outer quadrant breast cancer.  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  The biopsy of the lymph node was negative.  ER was 30% and PR was negative.  HER2 was negative and Ki-67 was 95%.  She should be finished with chemotherapy the end of July.  Today the patient states she wants to do left-sided lumpectomy and possible mastopexy at the same time.      Review of Systems  Constitutional: Negative.   HENT: Negative.    Eyes: Negative.   Respiratory: Negative.  Negative for chest tightness.   Cardiovascular: Negative.   Gastrointestinal: Negative.   Endocrine: Negative.   Genitourinary: Negative.   Musculoskeletal: Negative.        Objective:   Physical Exam Vitals and nursing note reviewed.  Constitutional:      Appearance: Normal appearance.  HENT:     Head: Normocephalic.  Cardiovascular:     Rate and Rhythm: Normal rate.     Pulses: Normal pulses.  Pulmonary:     Effort: Pulmonary effort is normal.  Skin:    General: Skin is warm.     Capillary Refill: Capillary refill takes less than 2 seconds.  Neurological:     Mental Status: She is alert and oriented to person, place, and time.  Psychiatric:        Mood and Affect: Mood normal.        Behavior: Behavior normal.        Thought Content: Thought content normal.        Judgment: Judgment normal.         Assessment & Plan:     ICD-10-CM   1. Malignant neoplasm of upper-outer quadrant of left breast in female, estrogen receptor positive (HCC)  C50.412    Z17.0       I spoke with Dr. Carolynne Edouard and the lesion was around 2.5 cm at the time of the diagnosis.  With the chemotherapy it is likely smaller.  She may not need to have any significant amount removed  that would require revision.  The patient will meet with Dr. Carolynne Edouard again and they will talk about it and let me know if I need to do anything.  As of this moment Dr. Carolynne Edouard is thinking that mastopexy will not be needed.

## 2022-10-04 MED FILL — Dexamethasone Sodium Phosphate Inj 100 MG/10ML: INTRAMUSCULAR | Qty: 1 | Status: AC

## 2022-10-05 ENCOUNTER — Inpatient Hospital Stay: Payer: Managed Care, Other (non HMO) | Attending: Hematology and Oncology

## 2022-10-05 ENCOUNTER — Encounter: Payer: Self-pay | Admitting: Hematology and Oncology

## 2022-10-05 ENCOUNTER — Encounter: Payer: Self-pay | Admitting: Adult Health

## 2022-10-05 ENCOUNTER — Inpatient Hospital Stay: Payer: Managed Care, Other (non HMO) | Admitting: Adult Health

## 2022-10-05 ENCOUNTER — Inpatient Hospital Stay: Payer: Managed Care, Other (non HMO)

## 2022-10-05 VITALS — BP 113/68 | HR 86 | Temp 97.3°F | Resp 18 | Ht 64.0 in | Wt 176.7 lb

## 2022-10-05 DIAGNOSIS — Z170 Estrogen receptor positive status [ER+]: Secondary | ICD-10-CM | POA: Insufficient documentation

## 2022-10-05 DIAGNOSIS — C50412 Malignant neoplasm of upper-outer quadrant of left female breast: Secondary | ICD-10-CM

## 2022-10-05 DIAGNOSIS — Z95828 Presence of other vascular implants and grafts: Secondary | ICD-10-CM

## 2022-10-05 DIAGNOSIS — Z5111 Encounter for antineoplastic chemotherapy: Secondary | ICD-10-CM | POA: Insufficient documentation

## 2022-10-05 LAB — CBC WITH DIFFERENTIAL (CANCER CENTER ONLY)
Abs Immature Granulocytes: 0.05 10*3/uL (ref 0.00–0.07)
Basophils Absolute: 0.1 10*3/uL (ref 0.0–0.1)
Basophils Relative: 1 %
Eosinophils Absolute: 0.4 10*3/uL (ref 0.0–0.5)
Eosinophils Relative: 7 %
HCT: 29.8 % — ABNORMAL LOW (ref 36.0–46.0)
Hemoglobin: 9.7 g/dL — ABNORMAL LOW (ref 12.0–15.0)
Immature Granulocytes: 1 %
Lymphocytes Relative: 20 %
Lymphs Abs: 1.1 10*3/uL (ref 0.7–4.0)
MCH: 27.6 pg (ref 26.0–34.0)
MCHC: 32.6 g/dL (ref 30.0–36.0)
MCV: 84.9 fL (ref 80.0–100.0)
Monocytes Absolute: 0.6 10*3/uL (ref 0.1–1.0)
Monocytes Relative: 10 %
Neutro Abs: 3.5 10*3/uL (ref 1.7–7.7)
Neutrophils Relative %: 61 %
Platelet Count: 329 10*3/uL (ref 150–400)
RBC: 3.51 MIL/uL — ABNORMAL LOW (ref 3.87–5.11)
RDW: 19.9 % — ABNORMAL HIGH (ref 11.5–15.5)
WBC Count: 5.7 10*3/uL (ref 4.0–10.5)
nRBC: 0 % (ref 0.0–0.2)

## 2022-10-05 LAB — CMP (CANCER CENTER ONLY)
ALT: 19 U/L (ref 0–44)
AST: 19 U/L (ref 15–41)
Albumin: 4.1 g/dL (ref 3.5–5.0)
Alkaline Phosphatase: 62 U/L (ref 38–126)
Anion gap: 5 (ref 5–15)
BUN: 7 mg/dL (ref 6–20)
CO2: 28 mmol/L (ref 22–32)
Calcium: 8.6 mg/dL — ABNORMAL LOW (ref 8.9–10.3)
Chloride: 107 mmol/L (ref 98–111)
Creatinine: 0.66 mg/dL (ref 0.44–1.00)
GFR, Estimated: >60 mL/min (ref >60)
Glucose, Bld: 98 mg/dL (ref 70–99)
Potassium: 3.8 mmol/L (ref 3.5–5.1)
Sodium: 140 mmol/L (ref 135–145)
Total Bilirubin: 0.2 mg/dL — ABNORMAL LOW (ref 0.3–1.2)
Total Protein: 6.5 g/dL (ref 6.5–8.1)

## 2022-10-05 MED ORDER — DIPHENHYDRAMINE HCL 50 MG/ML IJ SOLN
50.0000 mg | Freq: Once | INTRAMUSCULAR | Status: AC
  Administered 2022-10-05: 50 mg via INTRAVENOUS
  Filled 2022-10-05: qty 1

## 2022-10-05 MED ORDER — SODIUM CHLORIDE 0.9 % IV SOLN: Freq: Once | INTRAVENOUS | Status: AC

## 2022-10-05 MED ORDER — FAMOTIDINE 20 MG IN NS 100 ML IVPB
20.0000 mg | Freq: Once | INTRAVENOUS | Status: AC
  Administered 2022-10-05: 20 mg via INTRAVENOUS
  Filled 2022-10-05: qty 100

## 2022-10-05 MED ORDER — SODIUM CHLORIDE 0.9 % IV SOLN
80.0000 mg/m2 | Freq: Once | INTRAVENOUS | Status: AC
  Administered 2022-10-05: 150 mg via INTRAVENOUS
  Filled 2022-10-05: qty 25

## 2022-10-05 MED ORDER — HEPARIN SOD (PORK) LOCK FLUSH 100 UNIT/ML IV SOLN
500.0000 [IU] | Freq: Once | INTRAVENOUS | Status: AC | PRN
  Administered 2022-10-05: 500 [IU]

## 2022-10-05 MED ORDER — SODIUM CHLORIDE 0.9% FLUSH
10.0000 mL | Freq: Once | INTRAVENOUS | Status: AC
  Administered 2022-10-05: 10 mL

## 2022-10-05 MED ORDER — SODIUM CHLORIDE 0.9% FLUSH
10.0000 mL | INTRAVENOUS | Status: DC | PRN
  Administered 2022-10-05: 10 mL

## 2022-10-05 MED ORDER — SODIUM CHLORIDE 0.9 % IV SOLN
10.0000 mg | Freq: Once | INTRAVENOUS | Status: AC
  Administered 2022-10-05: 10 mg via INTRAVENOUS
  Filled 2022-10-05: qty 10

## 2022-10-05 MED ORDER — METHYLPREDNISOLONE SODIUM SUCC 125 MG IJ SOLR
60.0000 mg | INTRAMUSCULAR | Status: DC | PRN
  Administered 2022-10-05: 60 mg via INTRAVENOUS
  Filled 2022-10-05: qty 2

## 2022-10-05 NOTE — Addendum Note (Signed)
Addended by: Noreene Filbert on: 10/05/2022 12:41 PM   Modules accepted: Orders

## 2022-10-05 NOTE — Assessment & Plan Note (Addendum)
Nicole Maddox is a 42 year old woman with stage Ib left-sided ER positive breast cancer diagnosed in January 2024 here for follow-up and evaluation prior to receiving cycle 4 of neoadjuvant Taxol.   Treatment Plan: Neoadjuvant chemotherapy with Adriamycin/Cytoxan every 2 weeks x 4 followed by weekly Taxol x 12.  Breast Conserving surgery with sentinel node biopsy Adjuvant Radiation therapy Antiestrogen therapy  Chemo toxicities:  Fatigue-managing with energy conservation Dehydration-IVF on injection day Constipation: I recommended Senokot S2 tablets p.o. twice daily and MiraLAX p.o. daily.  Her abdominal exam is benign. Nail dyscrasia: I reassured her that her nails will grow out and the discoloration will resolve once she finishes her treatment.  She is not experiencing any peripheral neuropathy.  I reviewed her labs with her.  She knows to call for any questions or concerns that may arise between now and her next appointment with [place].  We will see her back in 1 week for labs, follow-up, and her next Taxol.

## 2022-10-05 NOTE — Progress Notes (Signed)
[facility] Cancer Follow up:    Nicole Caffey, MD [address] [place] [address]   DIAGNOSIS:  Cancer Staging  Malignant neoplasm of upper-outer quadrant of left female breast [facility]) Staging form: Breast, AJCC 8th Edition - Clinical stage from 06/28/2022: Stage IB (cT1c, cN0(f), cM0, G3, ER+, PR-, HER2-) - Signed by Ronny Bacon, PA-C on 06/28/2022 Stage prefix: Initial diagnosis Method of lymph node assessment: Core biopsy Histologic grading system: 3 grade system   SUMMARY OF ONCOLOGIC HISTORY: Oncology History  Malignant neoplasm of upper-outer quadrant of left female breast (HCC)  06/28/2022 Initial Diagnosis   Primary malignant neoplasm of 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  The report date is July 05, 2022. Negative genetic testing on the Multi-cancer panel.  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  BLM c.3136G>A (p.Gly1046Ser), EGFR c.61G>A (p.Ala21Thr) and PDGFRA c.50G>T (p.Gly17Val) VUS identified.  The report date is July 11, 2022.  The STAT Breast cancer panel offered by [facility] includes sequencing and rearrangement analysis for the following 9 genes:  ATM, BRCA1, BRCA2, CDH1, CHEK2, PALB2, PTEN, STK11 and TP53.   The Multi-Cancer + RNA Panel offered by [facility] includes sequencing and/or deletion/duplication analysis of the following 70 genes:  AIP*, ALK, APC*, ATM*, AXIN2*, BAP1*, BARD1*, BLM*, BMPR1A*, BRCA1*, BRCA2*, BRIP1*, CDC73*, CDH1*, CDK4, CDKN1B*, CDKN2A, CHEK2*, CTNNA1*, DICER1*, EPCAM (del/dup only), EGFR, FH*, FLCN*, GREM1  (promoter dup only), HOXB13, KIT, LZTR1, MAX*, MBD4, MEN1*, MET, MITF, MLH1*, MSH2*, MSH3*, MSH6*, MUTYH*, NF1*, NF2*, NTHL1*, PALB2*, PDGFRA, PMS2*, POLD1*, POLE*, POT1*, PRKAR1A*, PTCH1*, PTEN*, RAD51C*, RAD51D*, RB1*, RET, SDHA* (sequencing only), SDHAF2*, SDHB*, SDHC*, SDHD*, SMAD4*, SMARCA4*, SMARCB1*, SMARCE1*, STK11*, SUFU*, TMEM127*, TP53*, TSC1*, TSC2*, VHL*. RNA analysis is performed for * genes.    07/20/2022 -  Chemotherapy   Patient is on Treatment Plan : BREAST ADJUVANT DOSE DENSE AC q14d / PACLitaxel q7d       CURRENT THERAPY: weekly taxol  INTERVAL HISTORY: Nicole Maddox 42 y.o. female returns for follow-up and evaluation prior to receiving her next weekly cycle of Taxol.  She tells me she is tolerating this moderately well.  She is tired more so than usual and that makes it difficult for her to do things throughout the day.  She denies any peripheral neuropathy.  She does note nail discoloration.  She continues to struggle with constipation and takes 2 magnesium tablets daily.  She is passing gas and denies any abdominal distention.   Patient Active Problem List   Diagnosis Date Noted   Port-A-Cath in place 07/20/2022   Genetic testing 07/07/2022   Family history of stomach cancer 06/29/2022   Malignant neoplasm of upper-outer quadrant of left female breast (HCC) 06/27/2022   Seborrheic dermatitis of scalp 02/25/2018    is allergic to adriamycin [doxorubicin].  MEDICAL HISTORY: Past Medical History:  Diagnosis Date   Breast cancer (HCC) 06/23/2022   Family history of stomach cancer    GERD (gastroesophageal reflux disease)    Hyperthyroidism    Thyroid disease     SURGICAL HISTORY: Past Surgical History:  Procedure Laterality Date  BREAST BIOPSY Left 06/23/2022   [place] LT BREAST BX W LOC DEV 1ST LESION IMG BX SPEC US GUIDE 06/23/2022 [facility] MAMMOGRAPHY   PORTACATH PLACEMENT Right 07/19/2022   Procedure: INSERTION PORT-A-CATH;  Surgeon: Griselda Miner, MD;   Location: [facility];  Service: General;  Laterality: Right;  60 MIN ROOM 8   WISDOM TOOTH EXTRACTION      SOCIAL HISTORY: Social History   Socioeconomic History   Marital status: Single    Spouse name: Not on file   Number of children: Not on file   Years of education: Not on file   Highest education level: Not on file  Occupational History   Not on file  Tobacco Use   Smoking status: Never   Smokeless tobacco: Never  Vaping Use   Vaping Use: Never used  Substance and Sexual Activity   Alcohol use: Yes    Comment: socially   Drug use: No   Sexual activity: Yes    Partners: Male    Birth control/protection: None    Comment: intercourse age 74, more than 6 sexual parters,   Other Topics Concern   Not on file  Social History Narrative   Not on file   Social Determinants of Health   Financial Resource Strain: Low Risk  (07/04/2022)   Overall Financial Resource Strain (CARDIA)    Difficulty of Paying Living Expenses: Not very hard  Food Insecurity: No Food Insecurity (06/28/2022)   Hunger Vital Sign    Worried About Running Out of Food in the Last Year: Never true    Ran Out of Food in the Last Year: Never true  Transportation Needs: No Transportation Needs (06/28/2022)   PRAPARE - Administrator, Civil Service (Medical): No    Lack of Transportation (Non-Medical): No  Physical Activity: Not on file  Stress: Not on file  Social Connections: Not on file  Intimate Partner Violence: Not At Risk (06/28/2022)   Humiliation, Afraid, Rape, and Kick questionnaire    Fear of Current or Ex-Partner: No    Emotionally Abused: No    Physically Abused: No    Sexually Abused: No    FAMILY HISTORY: Family History  Problem Relation Age of Onset   Heart attack Father 71   Stomach cancer Maternal Aunt        dx > 50   Throat cancer Maternal Uncle        dx 30s   Stomach cancer Maternal Uncle        dx > 50   Heart Problems Maternal Grandmother     Breast cancer Cousin        mother's maternal first cousin    Review of Systems  Constitutional:  Positive for fatigue. Negative for appetite change, chills, fever and unexpected weight change.  HENT:   Negative for hearing loss, lump/mass and trouble swallowing.   Eyes:  Negative for eye problems and icterus.  Respiratory:  Negative for chest tightness, cough and shortness of breath.   Cardiovascular:  Negative for chest pain, leg swelling and palpitations.  Gastrointestinal:  Positive for constipation. Negative for abdominal distention, abdominal pain, diarrhea, nausea and vomiting.  Endocrine: Negative for hot flashes.  Genitourinary:  Negative for difficulty urinating.   Musculoskeletal:  Negative for arthralgias.  Skin:  Negative for itching and rash.  Neurological:  Negative for dizziness, extremity weakness, headaches and numbness.  Hematological:  Negative for adenopathy. Does not bruise/bleed easily.  Psychiatric/Behavioral:  Negative for depression.  The patient is not nervous/anxious.       PHYSICAL EXAMINATION   Onc Performance Status - 10/05/22 1154       ECOG Perf Status   ECOG Perf Status Restricted in physically strenuous activity but ambulatory and able to carry out work of a light or sedentary nature, e.g., light house work, office work      KPS SCALE   KPS % SCORE Able to carry on normal activity, minor s/s of disease             Vitals:   10/05/22 1153  BP: 113/68  Pulse: 86  Resp: 18  Temp: (!) 97.3 F (36.3 C)  SpO2: 100%    Physical Exam Constitutional:      General: She is not in acute distress.    Appearance: Normal appearance. She is not toxic-appearing.  HENT:     Head: Normocephalic and atraumatic.     Mouth/Throat:     Mouth: Mucous membranes are moist.     Pharynx: Oropharynx is clear. No oropharyngeal exudate or posterior oropharyngeal erythema.  Eyes:     General: No scleral icterus. Cardiovascular:     Rate and Rhythm: Normal  rate and regular rhythm.     Pulses: Normal pulses.     Heart sounds: Normal heart sounds.  Pulmonary:     Effort: Pulmonary effort is normal.     Breath sounds: Normal breath sounds.  Abdominal:     General: Abdomen is flat. Bowel sounds are normal. There is no distension.     Palpations: Abdomen is soft.     Tenderness: There is no abdominal tenderness.  Musculoskeletal:        General: No swelling.     Cervical back: Neck supple.  Lymphadenopathy:     Cervical: No cervical adenopathy.  Skin:    General: Skin is warm and dry.     Findings: No rash.  Neurological:     General: No focal deficit present.     Mental Status: She is alert.  Psychiatric:        Mood and Affect: Mood normal.        Behavior: Behavior normal.     LABORATORY DATA:  CBC    Component Value Date/Time   WBC 5.7 10/05/2022 1134   WBC 15.4 (H) 07/22/2022 1535   RBC 3.51 (L) 10/05/2022 1134   HGB 9.7 (L) 10/05/2022 1134   HCT 29.8 (L) 10/05/2022 1134   PLT 329 10/05/2022 1134   MCV 84.9 10/05/2022 1134   MCH 27.6 10/05/2022 1134   MCHC 32.6 10/05/2022 1134   RDW 19.9 (H) 10/05/2022 1134   LYMPHSABS 1.1 10/05/2022 1134   MONOABS 0.6 10/05/2022 1134   EOSABS 0.4 10/05/2022 1134   BASOSABS 0.1 10/05/2022 1134    CMP     Component Value Date/Time   NA 140 10/05/2022 1134   K 3.8 10/05/2022 1134   CL 107 10/05/2022 1134   CO2 28 10/05/2022 1134   GLUCOSE 98 10/05/2022 1134   BUN 7 10/05/2022 1134   CREATININE 0.66 10/05/2022 1134   CALCIUM 8.6 (L) 10/05/2022 1134   PROT 6.5 10/05/2022 1134   ALBUMIN 4.1 10/05/2022 1134   AST 19 10/05/2022 1134   ALT 19 10/05/2022 1134   ALKPHOS 62 10/05/2022 1134   BILITOT 0.2 (L) 10/05/2022 1134   GFRNONAA >60 10/05/2022 1134       ASSESSMENT and THERAPY PLAN:   Malignant neoplasm of upper-outer quadrant of left female breast (  HCC) Nicole Maddox is a 42 year old woman with stage Ib left-sided ER positive breast cancer diagnosed in January 2024 here  for follow-up and evaluation prior to receiving cycle 4 of neoadjuvant Taxol.   Treatment Plan: Neoadjuvant chemotherapy with Adriamycin/Cytoxan every 2 weeks x 4 followed by weekly Taxol x 12.  Breast Conserving surgery with sentinel node biopsy Adjuvant Radiation therapy Antiestrogen therapy  Chemo toxicities:  Fatigue-managing with energy conservation Dehydration-IVF on injection day Constipation: I recommended Senokot S2 tablets p.o. twice daily and MiraLAX p.o. daily.  Her abdominal exam is benign. Nail dyscrasia: I reassured her that her nails will grow out and the discoloration will resolve once she finishes her treatment.  She is not experiencing any peripheral neuropathy.  I reviewed her labs with her.  She knows to call for any questions or concerns that may arise between now and her next appointment with [place].  We will see her back in 1 week for labs, follow-up, and her next Taxol.   All questions were answered. The patient knows to call the clinic with any problems, questions or concerns. We can certainly see the patient much sooner if necessary.  Total encounter time:20 minutes*in face-to-face visit time, chart review, lab review, care coordination, order entry, and documentation of the encounter time.    Lillard Anes, NP 10/05/22 12:27 PM Medical Oncology and Hematology [facility] [address] [place], [place] [address] Tel. [phone]    Fax. [phone]  *Total Encounter Time as defined by the Centers for Medicare and Medicaid Services includes, in addition to the face-to-face time of a patient visit (documented in the note above) non-face-to-face time: obtaining and reviewing outside history, ordering and reviewing medications, tests or procedures, care coordination (communications with other health care professionals or caregivers) and documentation in the medical record.

## 2022-10-05 NOTE — Progress Notes (Signed)
Patient c/o abdominal pain once the Taxol infusion was complete. PRN solu-medrol administered. Once the patient stated that the pain and cramping was feeling better she was de accessed and DC'd home with her mother present.

## 2022-10-06 ENCOUNTER — Other Ambulatory Visit: Payer: Self-pay

## 2022-10-06 ENCOUNTER — Telehealth: Payer: Self-pay

## 2022-10-06 NOTE — Telephone Encounter (Signed)
Notified Patient of completion of Cancer Information Form for disability claim. Fax transmission confirmation received. Copy of form placed for pick-up as requested. No other needs or concerns voiced at this time.

## 2022-10-11 MED FILL — Dexamethasone Sodium Phosphate Inj 100 MG/10ML: INTRAMUSCULAR | Qty: 1 | Status: AC

## 2022-10-12 ENCOUNTER — Other Ambulatory Visit: Payer: Self-pay

## 2022-10-12 ENCOUNTER — Inpatient Hospital Stay: Payer: Managed Care, Other (non HMO)

## 2022-10-12 ENCOUNTER — Inpatient Hospital Stay: Payer: Managed Care, Other (non HMO) | Admitting: Hematology and Oncology

## 2022-10-12 ENCOUNTER — Encounter: Payer: Self-pay | Admitting: Hematology and Oncology

## 2022-10-12 DIAGNOSIS — Z5111 Encounter for antineoplastic chemotherapy: Secondary | ICD-10-CM | POA: Diagnosis not present

## 2022-10-12 DIAGNOSIS — C50412 Malignant neoplasm of upper-outer quadrant of left female breast: Secondary | ICD-10-CM

## 2022-10-12 DIAGNOSIS — Z95828 Presence of other vascular implants and grafts: Secondary | ICD-10-CM

## 2022-10-12 DIAGNOSIS — Z170 Estrogen receptor positive status [ER+]: Secondary | ICD-10-CM

## 2022-10-12 LAB — CBC WITH DIFFERENTIAL (CANCER CENTER ONLY)
Abs Immature Granulocytes: 0.04 10*3/uL (ref 0.00–0.07)
Basophils Absolute: 0.1 10*3/uL (ref 0.0–0.1)
Basophils Relative: 1 %
Eosinophils Absolute: 0.3 10*3/uL (ref 0.0–0.5)
Eosinophils Relative: 6 %
HCT: 30.6 % — ABNORMAL LOW (ref 36.0–46.0)
Hemoglobin: 9.9 g/dL — ABNORMAL LOW (ref 12.0–15.0)
Immature Granulocytes: 1 %
Lymphocytes Relative: 23 %
Lymphs Abs: 1.2 10*3/uL (ref 0.7–4.0)
MCH: 27.4 pg (ref 26.0–34.0)
MCHC: 32.4 g/dL (ref 30.0–36.0)
MCV: 84.8 fL (ref 80.0–100.0)
Monocytes Absolute: 0.5 10*3/uL (ref 0.1–1.0)
Monocytes Relative: 8 %
Neutro Abs: 3.3 10*3/uL (ref 1.7–7.7)
Neutrophils Relative %: 61 %
Platelet Count: 297 10*3/uL (ref 150–400)
RBC: 3.61 MIL/uL — ABNORMAL LOW (ref 3.87–5.11)
RDW: 19.7 % — ABNORMAL HIGH (ref 11.5–15.5)
WBC Count: 5.4 10*3/uL (ref 4.0–10.5)
nRBC: 0 % (ref 0.0–0.2)

## 2022-10-12 LAB — CMP (CANCER CENTER ONLY)
ALT: 21 U/L (ref 0–44)
AST: 23 U/L (ref 15–41)
Albumin: 4 g/dL (ref 3.5–5.0)
Alkaline Phosphatase: 68 U/L (ref 38–126)
Anion gap: 7 (ref 5–15)
BUN: 7 mg/dL (ref 6–20)
CO2: 28 mmol/L (ref 22–32)
Calcium: 8.7 mg/dL — ABNORMAL LOW (ref 8.9–10.3)
Chloride: 107 mmol/L (ref 98–111)
Creatinine: 0.67 mg/dL (ref 0.44–1.00)
GFR, Estimated: >60 mL/min (ref >60)
Glucose, Bld: 86 mg/dL (ref 70–99)
Potassium: 3.8 mmol/L (ref 3.5–5.1)
Sodium: 142 mmol/L (ref 135–145)
Total Bilirubin: 0.2 mg/dL — ABNORMAL LOW (ref 0.3–1.2)
Total Protein: 6.6 g/dL (ref 6.5–8.1)

## 2022-10-12 MED ORDER — SODIUM CHLORIDE 0.9 % IV SOLN: Freq: Once | INTRAVENOUS | Status: AC

## 2022-10-12 MED ORDER — SODIUM CHLORIDE 0.9% FLUSH
10.0000 mL | Freq: Once | INTRAVENOUS | Status: AC
  Administered 2022-10-12: 10 mL

## 2022-10-12 MED ORDER — DIPHENHYDRAMINE HCL 50 MG/ML IJ SOLN
50.0000 mg | Freq: Once | INTRAMUSCULAR | Status: AC
  Administered 2022-10-12: 50 mg via INTRAVENOUS
  Filled 2022-10-12: qty 1

## 2022-10-12 MED ORDER — SODIUM CHLORIDE 0.9 % IV SOLN
10.0000 mg | Freq: Once | INTRAVENOUS | Status: AC
  Administered 2022-10-12: 10 mg via INTRAVENOUS
  Filled 2022-10-12: qty 10

## 2022-10-12 MED ORDER — SODIUM CHLORIDE 0.9 % IV SOLN
80.0000 mg/m2 | Freq: Once | INTRAVENOUS | Status: AC
  Administered 2022-10-12: 150 mg via INTRAVENOUS
  Filled 2022-10-12: qty 25

## 2022-10-12 MED ORDER — FAMOTIDINE 20 MG IN NS 100 ML IVPB
20.0000 mg | Freq: Once | INTRAVENOUS | Status: AC
  Administered 2022-10-12: 20 mg via INTRAVENOUS
  Filled 2022-10-12: qty 100

## 2022-10-12 MED ORDER — METHYLPREDNISOLONE SODIUM SUCC 125 MG IJ SOLR
60.0000 mg | INTRAMUSCULAR | Status: DC | PRN
  Administered 2022-10-12: 60 mg via INTRAVENOUS
  Filled 2022-10-12: qty 2

## 2022-10-12 NOTE — Progress Notes (Signed)
[facility] Cancer Follow up:    Nicole Caffey, MD [address] [place] [address]   DIAGNOSIS:  Cancer Staging  Malignant neoplasm of upper-outer quadrant of left female breast [facility]) Staging form: Breast, AJCC 8th Edition - Clinical stage from 06/28/2022: Stage IB (cT1c, cN0(f), cM0, G3, ER+, PR-, HER2-) - Signed by Ronny Bacon, PA-C on 06/28/2022 Stage prefix: Initial diagnosis Method of lymph node assessment: Core biopsy Histologic grading system: 3 grade system   SUMMARY OF ONCOLOGIC HISTORY: Oncology History  Malignant neoplasm of upper-outer quadrant of left female breast (HCC)  06/28/2022 Initial Diagnosis   Primary malignant neoplasm of 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  The report date is July 05, 2022. Negative genetic testing on the Multi-cancer panel.  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  BLM c.3136G>A (p.Gly1046Ser), EGFR c.61G>A (p.Ala21Thr) and PDGFRA c.50G>T (p.Gly17Val) VUS identified.  The report date is July 11, 2022.  The STAT Breast cancer panel offered by [facility] includes sequencing and rearrangement analysis for the following 9 genes:  ATM, BRCA1, BRCA2, CDH1, CHEK2, PALB2, PTEN, STK11 and TP53.   The Multi-Cancer + RNA Panel offered by [facility] includes sequencing and/or deletion/duplication analysis of the following 70 genes:  AIP*, ALK, APC*, ATM*, AXIN2*, BAP1*, BARD1*, BLM*, BMPR1A*, BRCA1*, BRCA2*, BRIP1*, CDC73*, CDH1*, CDK4, CDKN1B*, CDKN2A, CHEK2*, CTNNA1*, DICER1*, EPCAM (del/dup only), EGFR, FH*, FLCN*, GREM1  (promoter dup only), HOXB13, KIT, LZTR1, MAX*, MBD4, MEN1*, MET, MITF, MLH1*, MSH2*, MSH3*, MSH6*, MUTYH*, NF1*, NF2*, NTHL1*, PALB2*, PDGFRA, PMS2*, POLD1*, POLE*, POT1*, PRKAR1A*, PTCH1*, PTEN*, RAD51C*, RAD51D*, RB1*, RET, SDHA* (sequencing only), SDHAF2*, SDHB*, SDHC*, SDHD*, SMAD4*, SMARCA4*, SMARCB1*, SMARCE1*, STK11*, SUFU*, TMEM127*, TP53*, TSC1*, TSC2*, VHL*. RNA analysis is performed for * genes.    07/20/2022 -  Chemotherapy   Patient is on Treatment Plan : BREAST ADJUVANT DOSE DENSE AC q14d / PACLitaxel q7d       CURRENT THERAPY: Neoadjuvant Adriamycin and Cytoxan  INTERVAL HISTORY:  Nicole Maddox 42 y.o. female returns for follow-up and evaluation prior to receiving weekly taxol. Since last visit, she has ongoing fatigue, some hot flashes, some intermittent lower abdominal discomfort and some scant nose bleeds. She reports some pain in the tips of her fingers which last for a few seconds. She doesn't feel her breast mass anymore. Rest of the pertinent 10 point ROS reviewed and neg.   Patient Active Problem List   Diagnosis Date Noted   Port-A-Cath in place 07/20/2022   Genetic testing 07/07/2022   Family history of stomach cancer 06/29/2022   Malignant neoplasm of upper-outer quadrant of left female breast (HCC) 06/27/2022   Seborrheic dermatitis of scalp 02/25/2018    is allergic to adriamycin [doxorubicin].  MEDICAL HISTORY: Past Medical History:  Diagnosis Date   Breast cancer (HCC) 06/23/2022   Family history of stomach cancer    GERD (gastroesophageal reflux disease)    Hyperthyroidism    Thyroid disease     SURGICAL HISTORY: Past Surgical History:  Procedure Laterality Date   BREAST BIOPSY Left 06/23/2022   [place] LT BREAST BX W LOC DEV  1ST LESION IMG BX SPEC US GUIDE 06/23/2022 [facility] MAMMOGRAPHY   PORTACATH PLACEMENT Right 07/19/2022   Procedure: INSERTION PORT-A-CATH;  Surgeon: Griselda Miner, MD;  Location: [facility];  Service: General;   Laterality: Right;  60 MIN ROOM 8   WISDOM TOOTH EXTRACTION      SOCIAL HISTORY: Social History   Socioeconomic History   Marital status: Single    Spouse name: Not on file   Number of children: Not on file   Years of education: Not on file   Highest education level: Not on file  Occupational History   Not on file  Tobacco Use   Smoking status: Never   Smokeless tobacco: Never  Vaping Use   Vaping Use: Never used  Substance and Sexual Activity   Alcohol use: Yes    Comment: socially   Drug use: No   Sexual activity: Yes    Partners: Male    Birth control/protection: None    Comment: intercourse age 19, more than 6 sexual parters,   Other Topics Concern   Not on file  Social History Narrative   Not on file   Social Determinants of Health   Financial Resource Strain: Low Risk  (07/04/2022)   Overall Financial Resource Strain (CARDIA)    Difficulty of Paying Living Expenses: Not very hard  Food Insecurity: No Food Insecurity (06/28/2022)   Hunger Vital Sign    Worried About Running Out of Food in the Last Year: Never true    Ran Out of Food in the Last Year: Never true  Transportation Needs: No Transportation Needs (06/28/2022)   PRAPARE - Administrator, Civil Service (Medical): No    Lack of Transportation (Non-Medical): No  Physical Activity: Not on file  Stress: Not on file  Social Connections: Not on file  Intimate Partner Violence: Not At Risk (06/28/2022)   Humiliation, Afraid, Rape, and Kick questionnaire    Fear of Current or Ex-Partner: No    Emotionally Abused: No    Physically Abused: No    Sexually Abused: No    FAMILY HISTORY: Family History  Problem Relation Age of Onset   Heart attack Father 11   Stomach cancer Maternal Aunt        dx > 50   Throat cancer Maternal Uncle        dx 25s   Stomach cancer Maternal Uncle        dx > 50   Heart Problems Maternal Grandmother    Breast cancer Cousin        mother's maternal first cousin     Review of Systems  Constitutional:  Positive for fatigue (Mild). Negative for appetite change, chills, fever and unexpected weight change.  HENT:   Negative for hearing loss, lump/mass and trouble swallowing.   Eyes:  Negative for eye problems and icterus.  Respiratory:  Negative for chest tightness, cough and shortness of breath.   Cardiovascular:  Negative for chest pain, leg swelling and palpitations.  Gastrointestinal:  Negative for abdominal distention, abdominal pain, constipation, diarrhea, nausea and vomiting.  Endocrine: Negative for hot flashes.  Genitourinary:  Negative for difficulty urinating.   Musculoskeletal:  Negative for arthralgias.  Skin:  Negative for itching and rash.  Neurological:  Negative for dizziness, extremity weakness, headaches and numbness.  Hematological:  Negative for adenopathy. Does not bruise/bleed easily.  Psychiatric/Behavioral:  Negative for depression. The patient is not nervous/anxious.       PHYSICAL EXAMINATION  ECOG PERFORMANCE STATUS: 1 - Symptomatic but completely ambulatory  Vitals:   10/12/22 0917  BP: 135/82  Pulse: 99  Resp: 16  Temp: 98.2 F (36.8 C)  SpO2: 100%   Physical Exam Constitutional:      Appearance: Normal appearance.  Chest:     Comments: No palpable left breast upper outer quadrant mass. There is however an area of abnormal density in the are of previous abnormality. No regional adenopathy Musculoskeletal:     Cervical back: Normal range of motion and neck supple. No rigidity.  Lymphadenopathy:     Cervical: No cervical adenopathy.  Neurological:     Mental Status: She is alert.      LABORATORY DATA:  CBC    Component Value Date/Time   WBC 5.4 10/12/2022 0830   WBC 15.4 (H) 07/22/2022 1535   RBC 3.61 (L) 10/12/2022 0830   HGB 9.9 (L) 10/12/2022 0830   HCT 30.6 (L) 10/12/2022 0830   PLT 297 10/12/2022 0830   MCV 84.8 10/12/2022 0830   MCH 27.4 10/12/2022 0830   MCHC 32.4 10/12/2022 0830    RDW 19.7 (H) 10/12/2022 0830   LYMPHSABS 1.2 10/12/2022 0830   MONOABS 0.5 10/12/2022 0830   EOSABS 0.3 10/12/2022 0830   BASOSABS 0.1 10/12/2022 0830    CMP     Component Value Date/Time   NA 142 10/12/2022 0830   K 3.8 10/12/2022 0830   CL 107 10/12/2022 0830   CO2 28 10/12/2022 0830   GLUCOSE 86 10/12/2022 0830   BUN 7 10/12/2022 0830   CREATININE 0.67 10/12/2022 0830   CALCIUM 8.7 (L) 10/12/2022 0830   PROT 6.6 10/12/2022 0830   ALBUMIN 4.0 10/12/2022 0830   AST 23 10/12/2022 0830   ALT 21 10/12/2022 0830   ALKPHOS 68 10/12/2022 0830   BILITOT 0.2 (L) 10/12/2022 0830   GFRNONAA >60 10/12/2022 0830      ASSESSMENT and THERAPY PLAN:   Malignant neoplasm of upper-outer quadrant of left female breast (HCC) This is a very pleasant 42 year old female patient, premenopausal with 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  She denies any family history of breast cancer or ovarian cancer in the immediate family, may have a second cousin with breast cancer. We think she will be an excellent candidate for neoadjuvant therapy given large tumor size despite no definitive evidence of lymph node involvement especially with her high-grade, high proliferation index invasive ductal carcinoma which is functionally triple negative.I have discussed the regimen very clearly, mechanism of action of chemo, adverse effects including but not limited to fatigue, nausea, vomiting, diarrhea, increased risk of infections, neuropathy, cardiotoxicity.  She has now completed AC portion of chemo and is on weekly taxol. PE near complete response, some abnormal density in the area of the breast mass. She is tolerating chemo very well except for some minor issues. Ok to continue treatment today if labs are all within parameters.  # 2 Drug induced constipation, ok to  use PRN stool softeners and laxatives  #3 Anemia secondary to chemotherapy, no indication to transfuse, will continue to monitor.   All questions were answered. The patient knows to call the clinic with any problems, questions or concerns. We can certainly see the patient much sooner if necessary.  Total encounter time:30 minutes*in face-to-face visit time, chart review, lab review, care coordination, order entry, and documentation of the encounter time.   *  Total Encounter Time as defined by the Centers for Medicare and Medicaid Services includes, in addition to the face-to-face time of a patient visit (documented in the note above) non-face-to-face time: obtaining and reviewing outside history, ordering and reviewing medications, tests or procedures, care coordination (communications with other health care professionals or caregivers) and documentation in the medical record.

## 2022-10-12 NOTE — Assessment & Plan Note (Signed)
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  She denies any family history of breast cancer or ovarian cancer in the immediate family, may have a second cousin with breast cancer. We think she will be an excellent candidate for neoadjuvant therapy given large tumor size despite no definitive evidence of lymph node involvement especially with her high-grade, high proliferation index invasive ductal carcinoma which is functionally triple negative.I have discussed the regimen very clearly, mechanism of action of chemo, adverse effects including but not limited to fatigue, nausea, vomiting, diarrhea, increased risk of infections, neuropathy, cardiotoxicity.  She has now completed AC portion of chemo and is on weekly taxol. PE near complete response, some abnormal density in the area of the breast mass. She is tolerating chemo very well except for some minor issues. Ok to continue treatment today if labs are all within parameters.  # 2 Drug induced constipation, ok to use PRN stool softeners and laxatives  #3 Anemia secondary to chemotherapy, no indication to transfuse, will continue to monitor.

## 2022-10-17 ENCOUNTER — Ambulatory Visit (INDEPENDENT_AMBULATORY_CARE_PROVIDER_SITE_OTHER): Payer: Managed Care, Other (non HMO) | Admitting: Plastic Surgery

## 2022-10-17 DIAGNOSIS — C50412 Malignant neoplasm of upper-outer quadrant of left female breast: Secondary | ICD-10-CM | POA: Diagnosis not present

## 2022-10-17 DIAGNOSIS — Z170 Estrogen receptor positive status [ER+]: Secondary | ICD-10-CM

## 2022-10-17 NOTE — Progress Notes (Signed)
   Subjective:    Patient ID: Nicole Maddox, female    DOB: 22-Sep-1980, 42 y.o.   MRN: [id number]  The patient is a 42 year old female joining me by phone for further discussion about her breast reconstruction.  She is a patient of Dr. 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  She should be finished with chemotherapy in the next month and a half.  She is still wanting to do a partial mastectomy of the left with a mastopexy at the same time.  I think this is reasonable.      Review of Systems  Constitutional: Negative.   HENT: Negative.    Eyes: Negative.   Respiratory: Negative.    Cardiovascular: Negative.   Gastrointestinal: Negative.   Endocrine: Negative.   Genitourinary: Negative.   Musculoskeletal: Negative.        Objective:   Physical Exam      Assessment & Plan:     ICD-10-CM   1. Malignant neoplasm of upper-outer quadrant of left breast in female, estrogen receptor positive (HCC)  C50.412    Z17.0        Plan for left-sided mastopexy at the same time as the partial mastectomy with Dr. Carolynne Edouard.  I connected with  Nicole Maddox on 10/17/22 by phone and verified that I am speaking with the correct person using two identifiers.  The patient was at home in her car and I was at the office.  We spent 5 minutes in discussion.   I discussed the limitations of evaluation and management by telemedicine. The patient expressed understanding and agreed to proceed.

## 2022-10-18 MED FILL — Dexamethasone Sodium Phosphate Inj 100 MG/10ML: INTRAMUSCULAR | Qty: 1 | Status: AC

## 2022-10-18 NOTE — Progress Notes (Unsigned)
[place] Follow up:    [name], MD [address] [place] [address]   DIAGNOSIS: Cancer Staging  Malignant neoplasm of upper-outer quadrant of left female breast [facility]) Staging form: Breast, AJCC 8th Edition - Clinical stage from 06/28/2022: Stage IB (cT1c, cN0(f), cM0, G3, ER+, PR-, HER2-) - Signed by Hayden Pedro, PA-C on 06/28/2022 Stage prefix: Initial diagnosis Method of lymph node assessment: Core biopsy Histologic grading system: 3 grade system   SUMMARY OF ONCOLOGIC HISTORY: Oncology History  Malignant neoplasm of upper-outer quadrant of left female breast ([place])  06/28/2022 Initial Diagnosis   Primary malignant neoplasm of upper inner quadrant of left female breast ([place])   06/28/2022 Cancer Staging   Staging form: Breast, AJCC 8th Edition - Clinical stage from 06/28/2022: Stage IB (cT1c, cN0(f), cM0, G3, ER+, PR-, HER2-) - Signed by Hayden Pedro, PA-C on 06/28/2022 Stage prefix: Initial diagnosis Method of lymph node assessment: Core biopsy Histologic grading system: 3 grade system   07/11/2022 Genetic Testing   Negative genetic testing on the 9 gene STAT panel.  The report date is July 05, 2022. Negative genetic testing on the Multi-cancer panel.  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  BLM c.3136G>A (p.Gly1046Ser), EGFR c.61G>A (p.Ala21Thr) and PDGFRA c.50G>T (p.Gly17Val) VUS identified.  The report date is July 11, 2022.  The STAT Breast cancer panel offered by [facility] includes sequencing and rearrangement analysis for the following 9 genes:  ATM, BRCA1, BRCA2, CDH1, CHEK2, PALB2, PTEN, STK11 and TP53.   The Multi-Cancer + RNA Panel offered by [facility] includes sequencing and/or deletion/duplication analysis of the following 70 genes:  AIP*, ALK, APC*, ATM*, AXIN2*, BAP1*, BARD1*, BLM*, BMPR1A*, BRCA1*, BRCA2*, BRIP1*, CDC73*, CDH1*, CDK4, CDKN1B*, CDKN2A, CHEK2*, CTNNA1*, DICER1*, EPCAM (del/dup only), EGFR, FH*, FLCN*, GREM1  (promoter dup only), HOXB13, KIT, LZTR1, MAX*, MBD4, MEN1*, MET, MITF, MLH1*, MSH2*, MSH3*, MSH6*, MUTYH*, NF1*, NF2*, NTHL1*, PALB2*, PDGFRA, PMS2*, POLD1*, POLE*, POT1*, PRKAR1A*, PTCH1*, PTEN*, RAD51C*, RAD51D*, RB1*, RET, SDHA* (sequencing only), SDHAF2*, SDHB*, SDHC*, SDHD*, SMAD4*, SMARCA4*, SMARCB1*, SMARCE1*, STK11*, SUFU*, TMEM127*, TP53*, TSC1*, TSC2*, VHL*. RNA analysis is performed for * genes.    07/20/2022 -  Chemotherapy   Patient is on Treatment Plan : BREAST ADJUVANT DOSE DENSE AC q14d / PACLitaxel q7d       CURRENT THERAPY:  INTERVAL HISTORY: Nicole Maddox 42 y.o. female returns for    Patient Active Problem List   Diagnosis Date Noted   Port-A-Cath in place 07/20/2022   Genetic testing 07/07/2022   Family history of stomach cancer 06/29/2022   Malignant neoplasm of upper-outer quadrant of left female breast ([place]) 06/27/2022   Seborrheic dermatitis of scalp 02/25/2018    is allergic to adriamycin [doxorubicin].  MEDICAL HISTORY: Past Medical History:  Diagnosis Date   Breast cancer ([place]) 06/23/2022   Family history of stomach cancer    GERD (gastroesophageal reflux disease)    Hyperthyroidism    Thyroid disease     SURGICAL HISTORY: Past Surgical History:  Procedure Laterality Date   BREAST BIOPSY Left 06/23/2022   [place] LT BREAST BX W LOC DEV 1ST LESION IMG BX SPEC US GUIDE 06/23/2022 [facility] MAMMOGRAPHY   PORTACATH PLACEMENT Right 07/19/2022   Procedure: INSERTION PORT-A-CATH;  Surgeon: Jovita Kussmaul, MD;  Location: [facility];  Service: General;  Laterality: Right;  60 MIN ROOM 8   WISDOM TOOTH EXTRACTION      SOCIAL HISTORY: Social History   Socioeconomic History   Marital status:  Single    Spouse name: Not on file   Number of children: Not on file   Years of education: Not on file   Highest education level: Not on file  Occupational History   Not on file  Tobacco Use   Smoking status: Never   Smokeless tobacco: Never  Vaping  Use   Vaping Use: Never used  Substance and Sexual Activity   Alcohol use: Yes    Comment: socially   Drug use: No   Sexual activity: Yes    Partners: Male    Birth control/protection: None    Comment: intercourse age 14, more than 6 sexual parters,   Other Topics Concern   Not on file  Social History Narrative   Not on file   Social Determinants of Health   Financial Resource Strain: Low Risk  (07/04/2022)   Overall Financial Resource Strain (CARDIA)    Difficulty of Paying Living Expenses: Not very hard  Food Insecurity: No Food Insecurity (06/28/2022)   Hunger Vital Sign    Worried About Running Out of Food in the Last Year: Never true    Ran Out of Food in the Last Year: Never true  Transportation Needs: No Transportation Needs (06/28/2022)   PRAPARE - Administrator, Civil Service (Medical): No    Lack of Transportation (Non-Medical): No  Physical Activity: Not on file  Stress: Not on file  Social Connections: Not on file  Intimate Partner Violence: Not At Risk (06/28/2022)   Humiliation, Afraid, Rape, and Kick questionnaire    Fear of Current or Ex-Partner: No    Emotionally Abused: No    Physically Abused: No    Sexually Abused: No    FAMILY HISTORY: Family History  Problem Relation Age of Onset   Heart attack Father 7   Stomach cancer Maternal Aunt        dx > 50   Throat cancer Maternal Uncle        dx 3s   Stomach cancer Maternal Uncle        dx > 50   Heart Problems Maternal Grandmother    Breast cancer Cousin        mother's maternal first cousin    Review of Systems  Constitutional:  Negative for appetite change, chills, fatigue, fever and unexpected weight change.  HENT:   Negative for hearing loss, lump/mass and trouble swallowing.   Eyes:  Negative for eye problems and icterus.  Respiratory:  Negative for chest tightness, cough and shortness of breath.   Cardiovascular:  Negative for chest pain, leg swelling and palpitations.   Gastrointestinal:  Negative for abdominal distention, abdominal pain, constipation, diarrhea, nausea and vomiting.  Endocrine: Negative for hot flashes.  Genitourinary:  Negative for difficulty urinating.   Musculoskeletal:  Negative for arthralgias.  Skin:  Negative for itching and rash.  Neurological:  Negative for dizziness, extremity weakness, headaches and numbness.  Hematological:  Negative for adenopathy. Does not bruise/bleed easily.  Psychiatric/Behavioral:  Negative for depression. The patient is not nervous/anxious.       PHYSICAL EXAMINATION    There were no vitals filed for this visit.  Physical Exam Constitutional:      General: She is not in acute distress.    Appearance: Normal appearance. She is not toxic-appearing.  HENT:     Head: Normocephalic and atraumatic.  Eyes:     General: No scleral icterus. Cardiovascular:     Rate and Rhythm: Normal rate and regular rhythm.  Pulses: Normal pulses.     Heart sounds: Normal heart sounds.  Pulmonary:     Effort: Pulmonary effort is normal.     Breath sounds: Normal breath sounds.  Abdominal:     General: Abdomen is flat. Bowel sounds are normal. There is no distension.     Palpations: Abdomen is soft.     Tenderness: There is no abdominal tenderness.  Musculoskeletal:        General: No swelling.     Cervical back: Neck supple.  Lymphadenopathy:     Cervical: No cervical adenopathy.  Skin:    General: Skin is warm and dry.     Findings: No rash.  Neurological:     General: No focal deficit present.     Mental Status: She is alert.  Psychiatric:        Mood and Affect: Mood normal.        Behavior: Behavior normal.     LABORATORY DATA:  CBC    Component Value Date/Time   WBC 5.4 10/12/2022 0830   WBC 15.4 (H) 07/22/2022 1535   RBC 3.61 (L) 10/12/2022 0830   HGB 9.9 (L) 10/12/2022 0830   HCT 30.6 (L) 10/12/2022 0830   PLT 297 10/12/2022 0830   MCV 84.8 10/12/2022 0830   MCH 27.4 10/12/2022  0830   MCHC 32.4 10/12/2022 0830   RDW 19.7 (H) 10/12/2022 0830   LYMPHSABS 1.2 10/12/2022 0830   MONOABS 0.5 10/12/2022 0830   EOSABS 0.3 10/12/2022 0830   BASOSABS 0.1 10/12/2022 0830    CMP     Component Value Date/Time   NA 142 10/12/2022 0830   K 3.8 10/12/2022 0830   CL 107 10/12/2022 0830   CO2 28 10/12/2022 0830   GLUCOSE 86 10/12/2022 0830   BUN 7 10/12/2022 0830   CREATININE 0.67 10/12/2022 0830   CALCIUM 8.7 (L) 10/12/2022 0830   PROT 6.6 10/12/2022 0830   ALBUMIN 4.0 10/12/2022 0830   AST 23 10/12/2022 0830   ALT 21 10/12/2022 0830   ALKPHOS 68 10/12/2022 0830   BILITOT 0.2 (L) 10/12/2022 0830   GFRNONAA >60 10/12/2022 0830       PENDING LABS:   RADIOGRAPHIC STUDIES:  No results found.   PATHOLOGY:     ASSESSMENT and THERAPY PLAN:   No problem-specific Assessment & Plan notes found for this encounter.   No orders of the defined types were placed in this encounter.   All questions were answered. The patient knows to call the clinic with any problems, questions or concerns. We can certainly see the patient much sooner if necessary. This note was electronically signed. Noreene Filbert, NP 10/18/2022

## 2022-10-19 ENCOUNTER — Inpatient Hospital Stay: Payer: Managed Care, Other (non HMO) | Admitting: Adult Health

## 2022-10-19 ENCOUNTER — Encounter: Payer: Self-pay | Admitting: Adult Health

## 2022-10-19 ENCOUNTER — Inpatient Hospital Stay: Payer: Managed Care, Other (non HMO)

## 2022-10-19 VITALS — BP 115/70 | HR 80 | Resp 16

## 2022-10-19 VITALS — BP 111/66 | HR 81 | Temp 98.1°F | Resp 18 | Ht 64.0 in | Wt 174.4 lb

## 2022-10-19 DIAGNOSIS — C50412 Malignant neoplasm of upper-outer quadrant of left female breast: Secondary | ICD-10-CM

## 2022-10-19 DIAGNOSIS — Z5111 Encounter for antineoplastic chemotherapy: Secondary | ICD-10-CM | POA: Diagnosis not present

## 2022-10-19 DIAGNOSIS — Z95828 Presence of other vascular implants and grafts: Secondary | ICD-10-CM

## 2022-10-19 LAB — CBC WITH DIFFERENTIAL (CANCER CENTER ONLY)
Abs Immature Granulocytes: 0.02 10*3/uL (ref 0.00–0.07)
Basophils Absolute: 0.1 10*3/uL (ref 0.0–0.1)
Basophils Relative: 1 %
Eosinophils Absolute: 0.2 10*3/uL (ref 0.0–0.5)
Eosinophils Relative: 4 %
HCT: 30.7 % — ABNORMAL LOW (ref 36.0–46.0)
Hemoglobin: 10 g/dL — ABNORMAL LOW (ref 12.0–15.0)
Immature Granulocytes: 0 %
Lymphocytes Relative: 24 %
Lymphs Abs: 1.2 10*3/uL (ref 0.7–4.0)
MCH: 27.4 pg (ref 26.0–34.0)
MCHC: 32.6 g/dL (ref 30.0–36.0)
MCV: 84.1 fL (ref 80.0–100.0)
Monocytes Absolute: 0.4 10*3/uL (ref 0.1–1.0)
Monocytes Relative: 9 %
Neutro Abs: 3.1 10*3/uL (ref 1.7–7.7)
Neutrophils Relative %: 62 %
Platelet Count: 280 10*3/uL (ref 150–400)
RBC: 3.65 MIL/uL — ABNORMAL LOW (ref 3.87–5.11)
RDW: 19.5 % — ABNORMAL HIGH (ref 11.5–15.5)
WBC Count: 5.1 10*3/uL (ref 4.0–10.5)
nRBC: 0 % (ref 0.0–0.2)

## 2022-10-19 LAB — CMP (CANCER CENTER ONLY)
ALT: 20 U/L (ref 0–44)
AST: 21 U/L (ref 15–41)
Albumin: 4 g/dL (ref 3.5–5.0)
Alkaline Phosphatase: 67 U/L (ref 38–126)
Anion gap: 6 (ref 5–15)
BUN: 7 mg/dL (ref 6–20)
CO2: 29 mmol/L (ref 22–32)
Calcium: 8.6 mg/dL — ABNORMAL LOW (ref 8.9–10.3)
Chloride: 107 mmol/L (ref 98–111)
Creatinine: 0.68 mg/dL (ref 0.44–1.00)
GFR, Estimated: >60 mL/min (ref >60)
Glucose, Bld: 107 mg/dL — ABNORMAL HIGH (ref 70–99)
Potassium: 3.5 mmol/L (ref 3.5–5.1)
Sodium: 142 mmol/L (ref 135–145)
Total Bilirubin: 0.2 mg/dL — ABNORMAL LOW (ref 0.3–1.2)
Total Protein: 6.5 g/dL (ref 6.5–8.1)

## 2022-10-19 MED ORDER — SODIUM CHLORIDE 0.9 % IV SOLN: Freq: Once | INTRAVENOUS | Status: AC

## 2022-10-19 MED ORDER — SODIUM CHLORIDE 0.9% FLUSH
10.0000 mL | INTRAVENOUS | Status: DC | PRN
  Administered 2022-10-19: 10 mL

## 2022-10-19 MED ORDER — KETOROLAC TROMETHAMINE 30 MG/ML IJ SOLN
10.0000 mg | Freq: Once | INTRAMUSCULAR | Status: AC
  Administered 2022-10-19: 9.9 mg via INTRAVENOUS
  Filled 2022-10-19: qty 1

## 2022-10-19 MED ORDER — SODIUM CHLORIDE 0.9 % IV SOLN
80.0000 mg/m2 | Freq: Once | INTRAVENOUS | Status: AC
  Administered 2022-10-19: 150 mg via INTRAVENOUS
  Filled 2022-10-19: qty 25

## 2022-10-19 MED ORDER — METHYLPREDNISOLONE SODIUM SUCC 125 MG IJ SOLR
60.0000 mg | INTRAMUSCULAR | Status: DC | PRN
  Administered 2022-10-19: 60 mg via INTRAVENOUS
  Filled 2022-10-19: qty 2

## 2022-10-19 MED ORDER — DIPHENHYDRAMINE HCL 50 MG/ML IJ SOLN
50.0000 mg | Freq: Once | INTRAMUSCULAR | Status: AC
  Administered 2022-10-19: 50 mg via INTRAVENOUS
  Filled 2022-10-19: qty 1

## 2022-10-19 MED ORDER — SODIUM CHLORIDE 0.9 % IV SOLN
10.0000 mg | Freq: Once | INTRAVENOUS | Status: AC
  Administered 2022-10-19: 10 mg via INTRAVENOUS
  Filled 2022-10-19: qty 10

## 2022-10-19 MED ORDER — FAMOTIDINE 20 MG IN NS 100 ML IVPB
20.0000 mg | Freq: Once | INTRAVENOUS | Status: AC
  Administered 2022-10-19: 20 mg via INTRAVENOUS
  Filled 2022-10-19: qty 100

## 2022-10-19 MED ORDER — SODIUM CHLORIDE 0.9% FLUSH
10.0000 mL | Freq: Once | INTRAVENOUS | Status: AC
  Administered 2022-10-19: 10 mL

## 2022-10-19 MED ORDER — HEPARIN SOD (PORK) LOCK FLUSH 100 UNIT/ML IV SOLN
500.0000 [IU] | Freq: Once | INTRAVENOUS | Status: AC | PRN
  Administered 2022-10-19: 500 [IU]

## 2022-10-19 NOTE — Progress Notes (Signed)
Approximately 45 minutes into Taxol infusion, patient complained of severe abdominal cramping, not relieved by solumedrol. Mardella Layman, NP, notified. Order for Toradol obtained, patient reports decrease in abdominal cramps, however, not completely resolved. Pt able to complete Taxol infusion and states that cramps usually resolve by the time she gets home. VSS at time of discharge. Pt advised by team to obtain and bring Gas-X to next infusion appointment.

## 2022-10-19 NOTE — Patient Instructions (Signed)
[facility]  Discharge Instructions: Thank you for choosing [facility] to provide your oncology and hematology care.   If you have a lab appointment with the Cancer Center, please go directly to the Cancer Center and check in at the registration area.   Wear comfortable clothing and clothing appropriate for easy access to any Portacath or PICC line.   We strive to give you quality time with your provider. You may need to reschedule your appointment if you arrive late (15 or more minutes).  Arriving late affects you and other patients whose appointments are after yours.  Also, if you miss three or more appointments without notifying the office, you may be dismissed from the clinic at the provider's discretion.      For prescription refill requests, have your pharmacy contact our office and allow 72 hours for refills to be completed.    Today you received the following chemotherapy and/or immunotherapy agents: paclitaxel      To help prevent nausea and vomiting after your treatment, we encourage you to take your nausea medication as directed.  BELOW ARE SYMPTOMS THAT SHOULD BE REPORTED IMMEDIATELY: *FEVER GREATER THAN 100.4 F (38 C) OR HIGHER *CHILLS OR SWEATING *NAUSEA AND VOMITING THAT IS NOT CONTROLLED WITH YOUR NAUSEA MEDICATION *UNUSUAL SHORTNESS OF BREATH *UNUSUAL BRUISING OR BLEEDING *URINARY PROBLEMS (pain or burning when urinating, or frequent urination) *BOWEL PROBLEMS (unusual diarrhea, constipation, pain near the anus) TENDERNESS IN MOUTH AND THROAT WITH OR WITHOUT PRESENCE OF ULCERS (sore throat, sores in mouth, or a toothache) UNUSUAL RASH, SWELLING OR PAIN  UNUSUAL VAGINAL DISCHARGE OR ITCHING   Items with * indicate a potential emergency and should be followed up as soon as possible or go to the Emergency Department if any problems should occur.  Please show the CHEMOTHERAPY ALERT CARD or IMMUNOTHERAPY ALERT CARD at  check-in to the Emergency Department and triage nurse.  Should you have questions after your visit or need to cancel or reschedule your appointment, please contact [facility]  Dept: [phone]  and follow the prompts.  Office hours are 8:00 a.m. to 4:30 p.m. Monday - Friday. Please note that voicemails left after 4:00 p.m. may not be returned until the following business day.  We are closed weekends and major holidays. You have access to a nurse at all times for urgent questions. Please call the main number to the clinic Dept: [phone] and follow the prompts.   For any non-urgent questions, you may also contact your provider using MyChart. We now offer e-Visits for anyone 18 and older to request care online for non-urgent symptoms. For details visit [email or web address].   Also download the MyChart app! Go to the app store, search "MyChart", open the app, select [facility], and log in with your MyChart username and password.   

## 2022-10-19 NOTE — Assessment & Plan Note (Addendum)
Nicole Maddox is a 42 year old woman with stage Ib left-sided ER positive breast cancer diagnosed in January 2024 here for follow-up and evaluation prior to receiving cycle 6 of neoadjuvant Taxol.   Treatment Plan: Neoadjuvant chemotherapy with Adriamycin/Cytoxan every 2 weeks x 4 followed by weekly Taxol x 12.  Breast Conserving surgery with sentinel node biopsy Adjuvant Radiation therapy Antiestrogen therapy  Chemo toxicities:  Fatigue-managing with energy conservation Dehydration-resolved Constipation: Continue PRN Senokot S and Miralax. Nail dyscrasia: Stable.  I reviewed her labs with her in detail.  They ARE all stable and within parameters for treatment.  She has no peripheral neuropathy.  She will proceed with treatment today.  We will see her back in 1 week for labs, follow-up, and her next treatment.

## 2022-10-20 ENCOUNTER — Encounter: Payer: Self-pay | Admitting: *Deleted

## 2022-10-23 ENCOUNTER — Telehealth: Payer: Self-pay | Admitting: Hematology and Oncology

## 2022-10-23 ENCOUNTER — Encounter: Payer: Self-pay | Admitting: Hematology and Oncology

## 2022-10-23 NOTE — Telephone Encounter (Signed)
Scheduled appointments per WQ. Patient is aware of all made appointments. 

## 2022-10-24 ENCOUNTER — Other Ambulatory Visit: Payer: Self-pay

## 2022-10-25 MED FILL — Dexamethasone Sodium Phosphate Inj 100 MG/10ML: INTRAMUSCULAR | Qty: 1 | Status: AC

## 2022-10-26 ENCOUNTER — Encounter: Payer: Self-pay | Admitting: Adult Health

## 2022-10-26 ENCOUNTER — Inpatient Hospital Stay: Payer: Managed Care, Other (non HMO)

## 2022-10-26 ENCOUNTER — Inpatient Hospital Stay: Payer: Managed Care, Other (non HMO) | Admitting: Adult Health

## 2022-10-26 ENCOUNTER — Other Ambulatory Visit: Payer: Self-pay | Admitting: *Deleted

## 2022-10-26 VITALS — BP 122/79 | HR 79 | Resp 18

## 2022-10-26 DIAGNOSIS — C50412 Malignant neoplasm of upper-outer quadrant of left female breast: Secondary | ICD-10-CM

## 2022-10-26 DIAGNOSIS — Z95828 Presence of other vascular implants and grafts: Secondary | ICD-10-CM

## 2022-10-26 DIAGNOSIS — Z5111 Encounter for antineoplastic chemotherapy: Secondary | ICD-10-CM | POA: Diagnosis not present

## 2022-10-26 DIAGNOSIS — Z170 Estrogen receptor positive status [ER+]: Secondary | ICD-10-CM

## 2022-10-26 LAB — CBC WITH DIFFERENTIAL (CANCER CENTER ONLY)
Abs Immature Granulocytes: 0.03 10*3/uL (ref 0.00–0.07)
Basophils Absolute: 0.1 10*3/uL (ref 0.0–0.1)
Basophils Relative: 1 %
Eosinophils Absolute: 0.2 10*3/uL (ref 0.0–0.5)
Eosinophils Relative: 4 %
HCT: 30.8 % — ABNORMAL LOW (ref 36.0–46.0)
Hemoglobin: 10.4 g/dL — ABNORMAL LOW (ref 12.0–15.0)
Immature Granulocytes: 1 %
Lymphocytes Relative: 31 %
Lymphs Abs: 1.3 10*3/uL (ref 0.7–4.0)
MCH: 28 pg (ref 26.0–34.0)
MCHC: 33.8 g/dL (ref 30.0–36.0)
MCV: 82.8 fL (ref 80.0–100.0)
Monocytes Absolute: 0.3 10*3/uL (ref 0.1–1.0)
Monocytes Relative: 7 %
Neutro Abs: 2.4 10*3/uL (ref 1.7–7.7)
Neutrophils Relative %: 56 %
Platelet Count: 276 10*3/uL (ref 150–400)
RBC: 3.72 MIL/uL — ABNORMAL LOW (ref 3.87–5.11)
RDW: 18.6 % — ABNORMAL HIGH (ref 11.5–15.5)
WBC Count: 4.2 10*3/uL (ref 4.0–10.5)
nRBC: 0 % (ref 0.0–0.2)

## 2022-10-26 LAB — CMP (CANCER CENTER ONLY)
ALT: 18 U/L (ref 0–44)
AST: 18 U/L (ref 15–41)
Albumin: 4 g/dL (ref 3.5–5.0)
Alkaline Phosphatase: 65 U/L (ref 38–126)
Anion gap: 6 (ref 5–15)
BUN: 8 mg/dL (ref 6–20)
CO2: 28 mmol/L (ref 22–32)
Calcium: 8.8 mg/dL — ABNORMAL LOW (ref 8.9–10.3)
Chloride: 107 mmol/L (ref 98–111)
Creatinine: 0.65 mg/dL (ref 0.44–1.00)
GFR, Estimated: >60 mL/min (ref >60)
Glucose, Bld: 116 mg/dL — ABNORMAL HIGH (ref 70–99)
Potassium: 3.9 mmol/L (ref 3.5–5.1)
Sodium: 141 mmol/L (ref 135–145)
Total Bilirubin: 0.2 mg/dL — ABNORMAL LOW (ref 0.3–1.2)
Total Protein: 6.5 g/dL (ref 6.5–8.1)

## 2022-10-26 MED ORDER — SODIUM CHLORIDE 0.9 % IV SOLN: Freq: Once | INTRAVENOUS | Status: AC

## 2022-10-26 MED ORDER — METHYLPREDNISOLONE SODIUM SUCC 125 MG IJ SOLR
60.0000 mg | INTRAMUSCULAR | Status: DC | PRN
  Administered 2022-10-26: 60 mg via INTRAVENOUS
  Filled 2022-10-26: qty 2

## 2022-10-26 MED ORDER — LORAZEPAM 2 MG/ML IJ SOLN
0.5000 mg | Freq: Once | INTRAMUSCULAR | Status: AC
  Administered 2022-10-26: 0.5 mg via INTRAVENOUS
  Filled 2022-10-26: qty 1

## 2022-10-26 MED ORDER — DIPHENHYDRAMINE HCL 50 MG/ML IJ SOLN
50.0000 mg | Freq: Once | INTRAMUSCULAR | Status: AC
  Administered 2022-10-26: 50 mg via INTRAVENOUS
  Filled 2022-10-26: qty 1

## 2022-10-26 MED ORDER — SODIUM CHLORIDE 0.9 % IV SOLN
10.0000 mg | Freq: Once | INTRAVENOUS | Status: AC
  Administered 2022-10-26: 10 mg via INTRAVENOUS
  Filled 2022-10-26: qty 10

## 2022-10-26 MED ORDER — SODIUM CHLORIDE 0.9% FLUSH
10.0000 mL | Freq: Once | INTRAVENOUS | Status: AC
  Administered 2022-10-26: 10 mL

## 2022-10-26 MED ORDER — FAMOTIDINE 20 MG IN NS 100 ML IVPB
20.0000 mg | Freq: Once | INTRAVENOUS | Status: AC
  Administered 2022-10-26: 20 mg via INTRAVENOUS
  Filled 2022-10-26: qty 100

## 2022-10-26 MED ORDER — SODIUM CHLORIDE 0.9 % IV SOLN
80.0000 mg/m2 | Freq: Once | INTRAVENOUS | Status: AC
  Administered 2022-10-26: 150 mg via INTRAVENOUS
  Filled 2022-10-26: qty 25

## 2022-10-26 MED ORDER — SODIUM CHLORIDE 0.9% FLUSH
10.0000 mL | INTRAVENOUS | Status: DC | PRN
  Administered 2022-10-26: 10 mL

## 2022-10-26 MED ORDER — HEPARIN SOD (PORK) LOCK FLUSH 100 UNIT/ML IV SOLN
500.0000 [IU] | Freq: Once | INTRAVENOUS | Status: AC | PRN
  Administered 2022-10-26: 500 [IU]

## 2022-10-26 NOTE — Progress Notes (Signed)
  Approximately 30 minutes into Taxol infusion, patient complained of severe abdominal cramping, not relieved by solumedrol or Gas X. Dr. Al Pimple notifed. Order for Ativan obtained, patient reports abdominal cramping resolved. Pt able to complete Taxol infusion. VSS at time of discharge.

## 2022-10-26 NOTE — Assessment & Plan Note (Addendum)
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   Treatment Plan: Neoadjuvant chemotherapy with Adriamycin/Cytoxan every 2 weeks x 4 followed by weekly Taxol x 12.  Breast Conserving surgery with sentinel node biopsy Adjuvant Radiation therapy Antiestrogen therapy  Chemo toxicities:  Fatigue-managing with energy conservation Dehydration-resolved Constipation: Continue PRN Senokot S and Miralax. Nail dyscrasia: Stable. RLQ discomfort during treatment: will take Gasx today   She is tolerating treatment well.  I communicated with our navigators that she plans to move to [place] at the end of June.  She still wants to return here for her surgery along with her oncology follow-up.  She wishes to receive radiation in [place].  She will RTC in 1 week for labs, f/u, and treatment.

## 2022-10-26 NOTE — Progress Notes (Signed)
Cancer Center Cancer Follow up:    Nicole Caffey, MD [address] [place] [address]   DIAGNOSIS:  Cancer Staging  Malignant neoplasm of upper-outer quadrant of left female breast [facility]) Staging form: Breast, AJCC 8th Edition - Clinical stage from 06/28/2022: Stage IB (cT1c, cN0(f), cM0, G3, ER+, PR-, HER2-) - Signed by Ronny Bacon, PA-C on 06/28/2022 Stage prefix: Initial diagnosis Method of lymph node assessment: Core biopsy Histologic grading system: 3 grade system   SUMMARY OF ONCOLOGIC HISTORY: Oncology History  Malignant neoplasm of upper-outer quadrant of left female breast (HCC)  06/28/2022 Initial Diagnosis   Primary malignant neoplasm of 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  The report date is July 05, 2022. Negative genetic testing on the Multi-cancer panel.  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  BLM c.3136G>A (p.Gly1046Ser), EGFR c.61G>A (p.Ala21Thr) and PDGFRA c.50G>T (p.Gly17Val) VUS identified.  The report date is July 11, 2022.  The STAT Breast cancer panel offered by [facility] includes sequencing and rearrangement analysis for the following 9 genes:  ATM, BRCA1, BRCA2, CDH1, CHEK2, PALB2, PTEN, STK11 and TP53.   The Multi-Cancer + RNA Panel offered by [facility] includes sequencing and/or deletion/duplication analysis of the following 70 genes:  AIP*, ALK, APC*, ATM*, AXIN2*, BAP1*, BARD1*, BLM*, BMPR1A*, BRCA1*, BRCA2*, BRIP1*, CDC73*, CDH1*, CDK4, CDKN1B*, CDKN2A, CHEK2*, CTNNA1*, DICER1*, EPCAM (del/dup only), EGFR, FH*, FLCN*, GREM1  (promoter dup only), HOXB13, KIT, LZTR1, MAX*, MBD4, MEN1*, MET, MITF, MLH1*, MSH2*, MSH3*, MSH6*, MUTYH*, NF1*, NF2*, NTHL1*, PALB2*, PDGFRA, PMS2*, POLD1*, POLE*, POT1*, PRKAR1A*, PTCH1*, PTEN*, RAD51C*, RAD51D*, RB1*, RET, SDHA* (sequencing only), SDHAF2*, SDHB*, SDHC*, SDHD*, SMAD4*, SMARCA4*, SMARCB1*, SMARCE1*, STK11*, SUFU*, TMEM127*, TP53*, TSC1*, TSC2*, VHL*. RNA analysis is performed for * genes.    07/20/2022 -  Chemotherapy   Patient is on Treatment Plan : BREAST ADJUVANT DOSE DENSE AC q14d / PACLitaxel q7d       CURRENT THERAPY: weekly Taxol  INTERVAL HISTORY: Nicole Maddox 42 y.o. female returns for follow-up prior to receiving her weekly Taxol.  She is doing well today.  She notes she has RLQ discomfort during her Taxol and is unsure what this is related to.  She brought Gas X today to see if that would help her discomfort.    She notes body aches worse when lying down and hot flashes at night.  She continues to experience constipation intermittently and has Senokot S and MiraLAX that she is taking to help with this.  Patient Active Problem List   Diagnosis Date Noted   Port-A-Cath in place 07/20/2022   Genetic testing 07/07/2022   Family history of stomach cancer 06/29/2022   Malignant neoplasm of upper-outer quadrant of left female breast (HCC) 06/27/2022   Seborrheic dermatitis of scalp 02/25/2018    is allergic to adriamycin [doxorubicin].  MEDICAL HISTORY: Past Medical History:  Diagnosis Date   Breast cancer (HCC) 06/23/2022   Family history of stomach cancer    GERD (gastroesophageal reflux disease)    Hyperthyroidism    Thyroid disease     SURGICAL HISTORY: Past Surgical History:  Procedure  Laterality Date   BREAST BIOPSY Left 06/23/2022   [place] LT BREAST BX W LOC DEV 1ST LESION IMG BX SPEC US GUIDE 06/23/2022 [facility] MAMMOGRAPHY   PORTACATH PLACEMENT Right 07/19/2022   Procedure: INSERTION PORT-A-CATH;  Surgeon: Griselda Miner, MD;  Location: [facility];  Service: General;  Laterality: Right;  60 MIN ROOM 8   WISDOM TOOTH EXTRACTION      SOCIAL HISTORY: Social History   Socioeconomic History   Marital status: Single    Spouse name: Not on file   Number of children: Not on file   Years of education: Not on file   Highest education level: Not on file  Occupational History   Not on file  Tobacco Use   Smoking status: Never   Smokeless tobacco: Never  Vaping Use   Vaping Use: Never used  Substance and Sexual Activity   Alcohol use: Yes    Comment: socially   Drug use: No   Sexual activity: Yes    Partners: Male    Birth control/protection: None    Comment: intercourse age 72, more than 6 sexual parters,   Other Topics Concern   Not on file  Social History Narrative   Not on file   Social Determinants of Health   Financial Resource Strain: Low Risk  (07/04/2022)   Overall Financial Resource Strain (CARDIA)    Difficulty of Paying Living Expenses: Not very hard  Food Insecurity: No Food Insecurity (06/28/2022)   Hunger Vital Sign    Worried About Running Out of Food in the Last Year: Never true    Ran Out of Food in the Last Year: Never true  Transportation Needs: No Transportation Needs (06/28/2022)   PRAPARE - Administrator, Civil Service (Medical): No    Lack of Transportation (Non-Medical): No  Physical Activity: Not on file  Stress: Not on file  Social Connections: Not on file  Intimate Partner Violence: Not At Risk (06/28/2022)   Humiliation, Afraid, Rape, and Kick questionnaire    Fear of Current or Ex-Partner: No    Emotionally Abused: No    Physically Abused: No    Sexually Abused: No    FAMILY HISTORY: Family History  Problem Relation Age of Onset   Heart attack Father 45   Stomach cancer Maternal Aunt        dx > 50   Throat cancer Maternal Uncle        dx 95s   Stomach cancer Maternal Uncle        dx > 50   Heart Problems Maternal Grandmother    Breast cancer Cousin         mother's maternal first cousin    Review of Systems  Constitutional:  Negative for appetite change, chills, fatigue, fever and unexpected weight change.  HENT:   Negative for hearing loss, lump/mass and trouble swallowing.   Eyes:  Negative for eye problems and icterus.  Respiratory:  Negative for chest tightness, cough and shortness of breath.   Cardiovascular:  Negative for chest pain, leg swelling and palpitations.  Gastrointestinal:  Negative for abdominal distention, abdominal pain, constipation, diarrhea, nausea and vomiting.  Endocrine: Negative for hot flashes.  Genitourinary:  Negative for difficulty urinating.   Musculoskeletal:  Negative for arthralgias.  Skin:  Negative for itching and rash.  Neurological:  Negative for dizziness, extremity weakness, headaches and numbness.  Hematological:  Negative for adenopathy. Does not bruise/bleed easily.  Psychiatric/Behavioral:  Negative for depression.  The patient is not nervous/anxious.       PHYSICAL EXAMINATION   Onc Performance Status - 10/26/22 0901       ECOG Perf Status   ECOG Perf Status Restricted in physically strenuous activity but ambulatory and able to carry out work of a light or sedentary nature, e.g., light house work, office work      KPS SCALE   KPS % SCORE Able to carry on normal activity, minor s/s of disease             Vitals:   10/26/22 0855  BP: (!) 114/59  Pulse: 96  Resp: 18  Temp: 97.7 F (36.5 C)  SpO2: 100%    Physical Exam Constitutional:      General: She is not in acute distress.    Appearance: Normal appearance. She is not toxic-appearing.  HENT:     Head: Normocephalic and atraumatic.     Mouth/Throat:     Mouth: Mucous membranes are moist.     Pharynx: Oropharynx is clear. No oropharyngeal exudate or posterior oropharyngeal erythema.  Eyes:     General: No scleral icterus. Cardiovascular:     Rate and Rhythm: Normal rate and regular rhythm.     Pulses: Normal  pulses.     Heart sounds: Normal heart sounds.  Pulmonary:     Effort: Pulmonary effort is normal.     Breath sounds: Normal breath sounds.  Abdominal:     General: Abdomen is flat. Bowel sounds are normal. There is no distension.     Palpations: Abdomen is soft.     Tenderness: There is no abdominal tenderness.  Musculoskeletal:        General: No swelling.     Cervical back: Neck supple.  Lymphadenopathy:     Cervical: No cervical adenopathy.  Skin:    General: Skin is warm and dry.     Findings: No rash.  Neurological:     General: No focal deficit present.     Mental Status: She is alert.  Psychiatric:        Mood and Affect: Mood normal.        Behavior: Behavior normal.     LABORATORY DATA:  CBC    Component Value Date/Time   WBC 4.2 10/26/2022 0823   WBC 15.4 (H) 07/22/2022 1535   RBC 3.72 (L) 10/26/2022 0823   HGB 10.4 (L) 10/26/2022 0823   HCT 30.8 (L) 10/26/2022 0823   PLT 276 10/26/2022 0823   MCV 82.8 10/26/2022 0823   MCH 28.0 10/26/2022 0823   MCHC 33.8 10/26/2022 0823   RDW 18.6 (H) 10/26/2022 0823   LYMPHSABS 1.3 10/26/2022 0823   MONOABS 0.3 10/26/2022 0823   EOSABS 0.2 10/26/2022 0823   BASOSABS 0.1 10/26/2022 0823    CMP     Component Value Date/Time   NA 141 10/26/2022 0823   K 3.9 10/26/2022 0823   CL 107 10/26/2022 0823   CO2 28 10/26/2022 0823   GLUCOSE 116 (H) 10/26/2022 0823   BUN 8 10/26/2022 0823   CREATININE 0.65 10/26/2022 0823   CALCIUM 8.8 (L) 10/26/2022 0823   PROT 6.5 10/26/2022 0823   ALBUMIN 4.0 10/26/2022 0823   AST 18 10/26/2022 0823   ALT 18 10/26/2022 0823   ALKPHOS 65 10/26/2022 0823   BILITOT 0.2 (L) 10/26/2022 0823   GFRNONAA >60 10/26/2022 6045       ASSESSMENT and THERAPY PLAN:   Malignant neoplasm of upper-outer quadrant of left female  breast (HCC) Kaytin is a 42 year old woman with stage Ib left-sided ER positive breast cancer diagnosed in January 2024 here for follow-up and evaluation prior to  receiving cycle 7 of neoadjuvant Taxol.   Treatment Plan: Neoadjuvant chemotherapy with Adriamycin/Cytoxan every 2 weeks x 4 followed by weekly Taxol x 12.  Breast Conserving surgery with sentinel node biopsy Adjuvant Radiation therapy Antiestrogen therapy  Chemo toxicities:  Fatigue-managing with energy conservation Dehydration-resolved Constipation: Continue PRN Senokot S and Miralax. Nail dyscrasia: Stable. RLQ discomfort during treatment: will take Gasx today   She is tolerating treatment well.  I communicated with our navigators that she plans to move to [place] at the end of June.  She still wants to return here for her surgery along with her oncology follow-up.  She wishes to receive radiation in [place].  She will RTC in 1 week for labs, f/u, and treatment.   All questions were answered. The patient knows to call the clinic with any problems, questions or concerns. We can certainly see the patient much sooner if necessary.  Total encounter time:30 minutes*in face-to-face visit time, chart review, lab review, care coordination, order entry, and documentation of the encounter time.    Lillard Anes, NP 10/26/22 10:07 AM Medical Oncology and Hematology [facility] [address] [place], [place] [address] Tel. [phone]    Fax. [phone]  *Total Encounter Time as defined by the Centers for Medicare and Medicaid Services includes, in addition to the face-to-face time of a patient visit (documented in the note above) non-face-to-face time: obtaining and reviewing outside history, ordering and reviewing medications, tests or procedures, care coordination (communications with other health care professionals or caregivers) and documentation in the medical record.

## 2022-10-26 NOTE — Patient Instructions (Signed)
[facility]  Discharge Instructions: Thank you for choosing [facility] to provide your oncology and hematology care.   If you have a lab appointment with the Cancer Center, please go directly to the Cancer Center and check in at the registration area.   Wear comfortable clothing and clothing appropriate for easy access to any Portacath or PICC line.   We strive to give you quality time with your provider. You may need to reschedule your appointment if you arrive late (15 or more minutes).  Arriving late affects you and other patients whose appointments are after yours.  Also, if you miss three or more appointments without notifying the office, you may be dismissed from the clinic at the provider's discretion.      For prescription refill requests, have your pharmacy contact our office and allow 72 hours for refills to be completed.    Today you received the following chemotherapy and/or immunotherapy agents: paclitaxel      To help prevent nausea and vomiting after your treatment, we encourage you to take your nausea medication as directed.  BELOW ARE SYMPTOMS THAT SHOULD BE REPORTED IMMEDIATELY: *FEVER GREATER THAN 100.4 F (38 C) OR HIGHER *CHILLS OR SWEATING *NAUSEA AND VOMITING THAT IS NOT CONTROLLED WITH YOUR NAUSEA MEDICATION *UNUSUAL SHORTNESS OF BREATH *UNUSUAL BRUISING OR BLEEDING *URINARY PROBLEMS (pain or burning when urinating, or frequent urination) *BOWEL PROBLEMS (unusual diarrhea, constipation, pain near the anus) TENDERNESS IN MOUTH AND THROAT WITH OR WITHOUT PRESENCE OF ULCERS (sore throat, sores in mouth, or a toothache) UNUSUAL RASH, SWELLING OR PAIN  UNUSUAL VAGINAL DISCHARGE OR ITCHING   Items with * indicate a potential emergency and should be followed up as soon as possible or go to the Emergency Department if any problems should occur.  Please show the CHEMOTHERAPY ALERT CARD or IMMUNOTHERAPY ALERT CARD at  check-in to the Emergency Department and triage nurse.  Should you have questions after your visit or need to cancel or reschedule your appointment, please contact [facility]  Dept: [phone]  and follow the prompts.  Office hours are 8:00 a.m. to 4:30 p.m. Monday - Friday. Please note that voicemails left after 4:00 p.m. may not be returned until the following business day.  We are closed weekends and major holidays. You have access to a nurse at all times for urgent questions. Please call the main number to the clinic Dept: [phone] and follow the prompts.   For any non-urgent questions, you may also contact your provider using MyChart. We now offer e-Visits for anyone 18 and older to request care online for non-urgent symptoms. For details visit [email or web address].   Also download the MyChart app! Go to the app store, search "MyChart", open the app, select [facility], and log in with your MyChart username and password.   

## 2022-11-01 MED FILL — Dexamethasone Sodium Phosphate Inj 100 MG/10ML: INTRAMUSCULAR | Qty: 1 | Status: AC

## 2022-11-02 ENCOUNTER — Inpatient Hospital Stay: Payer: Managed Care, Other (non HMO)

## 2022-11-02 ENCOUNTER — Encounter: Payer: Self-pay | Admitting: *Deleted

## 2022-11-02 ENCOUNTER — Inpatient Hospital Stay: Payer: Managed Care, Other (non HMO) | Admitting: Adult Health

## 2022-11-02 DIAGNOSIS — Z95828 Presence of other vascular implants and grafts: Secondary | ICD-10-CM

## 2022-11-02 DIAGNOSIS — Z5111 Encounter for antineoplastic chemotherapy: Secondary | ICD-10-CM | POA: Diagnosis not present

## 2022-11-02 DIAGNOSIS — C50412 Malignant neoplasm of upper-outer quadrant of left female breast: Secondary | ICD-10-CM | POA: Diagnosis not present

## 2022-11-02 LAB — CBC WITH DIFFERENTIAL (CANCER CENTER ONLY)
Abs Immature Granulocytes: 0.03 10*3/uL (ref 0.00–0.07)
Basophils Absolute: 0 10*3/uL (ref 0.0–0.1)
Basophils Relative: 1 %
Eosinophils Absolute: 0.1 10*3/uL (ref 0.0–0.5)
Eosinophils Relative: 4 %
HCT: 31.2 % — ABNORMAL LOW (ref 36.0–46.0)
Hemoglobin: 10.2 g/dL — ABNORMAL LOW (ref 12.0–15.0)
Immature Granulocytes: 1 %
Lymphocytes Relative: 35 %
Lymphs Abs: 1.3 10*3/uL (ref 0.7–4.0)
MCH: 27.2 pg (ref 26.0–34.0)
MCHC: 32.7 g/dL (ref 30.0–36.0)
MCV: 83.2 fL (ref 80.0–100.0)
Monocytes Absolute: 0.2 10*3/uL (ref 0.1–1.0)
Monocytes Relative: 6 %
Neutro Abs: 2.1 10*3/uL (ref 1.7–7.7)
Neutrophils Relative %: 53 %
Platelet Count: 293 10*3/uL (ref 150–400)
RBC: 3.75 MIL/uL — ABNORMAL LOW (ref 3.87–5.11)
RDW: 18.7 % — ABNORMAL HIGH (ref 11.5–15.5)
WBC Count: 3.8 10*3/uL — ABNORMAL LOW (ref 4.0–10.5)
nRBC: 0 % (ref 0.0–0.2)

## 2022-11-02 LAB — CMP (CANCER CENTER ONLY)
ALT: 17 U/L (ref 0–44)
AST: 19 U/L (ref 15–41)
Albumin: 4 g/dL (ref 3.5–5.0)
Alkaline Phosphatase: 61 U/L (ref 38–126)
Anion gap: 6 (ref 5–15)
BUN: 10 mg/dL (ref 6–20)
CO2: 28 mmol/L (ref 22–32)
Calcium: 8.7 mg/dL — ABNORMAL LOW (ref 8.9–10.3)
Chloride: 105 mmol/L (ref 98–111)
Creatinine: 0.7 mg/dL (ref 0.44–1.00)
GFR, Estimated: >60 mL/min (ref >60)
Glucose, Bld: 141 mg/dL — ABNORMAL HIGH (ref 70–99)
Potassium: 3.8 mmol/L (ref 3.5–5.1)
Sodium: 139 mmol/L (ref 135–145)
Total Bilirubin: 0.3 mg/dL (ref 0.3–1.2)
Total Protein: 6.7 g/dL (ref 6.5–8.1)

## 2022-11-02 MED ORDER — LORAZEPAM 2 MG/ML IJ SOLN
1.0000 mg | Freq: Once | INTRAMUSCULAR | Status: AC
  Administered 2022-11-02: 1 mg via INTRAVENOUS
  Filled 2022-11-02: qty 1

## 2022-11-02 MED ORDER — METHYLPREDNISOLONE SODIUM SUCC 125 MG IJ SOLR
60.0000 mg | Freq: Once | INTRAMUSCULAR | Status: AC
  Administered 2022-11-02: 60 mg via INTRAVENOUS
  Filled 2022-11-02: qty 2

## 2022-11-02 MED ORDER — SODIUM CHLORIDE 0.9 % IV SOLN: Freq: Once | INTRAVENOUS | Status: AC

## 2022-11-02 MED ORDER — FAMOTIDINE IN NACL 20-0.9 MG/50ML-% IV SOLN
20.0000 mg | Freq: Once | INTRAVENOUS | Status: AC
  Administered 2022-11-02: 20 mg via INTRAVENOUS
  Filled 2022-11-02: qty 50

## 2022-11-02 MED ORDER — DIPHENHYDRAMINE HCL 50 MG/ML IJ SOLN
50.0000 mg | Freq: Once | INTRAMUSCULAR | Status: AC
  Administered 2022-11-02: 50 mg via INTRAVENOUS
  Filled 2022-11-02: qty 1

## 2022-11-02 MED ORDER — SODIUM CHLORIDE 0.9% FLUSH
10.0000 mL | Freq: Once | INTRAVENOUS | Status: AC
  Administered 2022-11-02: 10 mL

## 2022-11-02 MED ORDER — SODIUM CHLORIDE 0.9 % IV SOLN
80.0000 mg/m2 | Freq: Once | INTRAVENOUS | Status: AC
  Administered 2022-11-02: 150 mg via INTRAVENOUS
  Filled 2022-11-02: qty 25

## 2022-11-02 MED ORDER — SODIUM CHLORIDE 0.9 % IV SOLN
10.0000 mg | Freq: Once | INTRAVENOUS | Status: AC
  Administered 2022-11-02: 10 mg via INTRAVENOUS
  Filled 2022-11-02: qty 10

## 2022-11-02 NOTE — Progress Notes (Signed)
[facility] Cancer Follow up:    Nicole Caffey, MD [address] [place] [address]   DIAGNOSIS:  Cancer Staging  Malignant neoplasm of upper-outer quadrant of left female breast [facility]) Staging form: Breast, AJCC 8th Edition - Clinical stage from 06/28/2022: Stage IB (cT1c, cN0(f), cM0, G3, ER+, PR-, HER2-) - Signed by Ronny Bacon, PA-C on 06/28/2022 Stage prefix: Initial diagnosis Method of lymph node assessment: Core biopsy Histologic grading system: 3 grade system   SUMMARY OF ONCOLOGIC HISTORY: Oncology History  Malignant neoplasm of upper-outer quadrant of left female breast (HCC)  06/28/2022 Initial Diagnosis   Primary malignant neoplasm of 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  The report date is July 05, 2022. Negative genetic testing on the Multi-cancer panel.  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  BLM c.3136G>A (p.Gly1046Ser), EGFR c.61G>A (p.Ala21Thr) and PDGFRA c.50G>T (p.Gly17Val) VUS identified.  The report date is July 11, 2022.  The STAT Breast cancer panel offered by [facility] includes sequencing and rearrangement analysis for the following 9 genes:  ATM, BRCA1, BRCA2, CDH1, CHEK2, PALB2, PTEN, STK11 and TP53.   The Multi-Cancer + RNA Panel offered by [facility] includes sequencing and/or deletion/duplication analysis of the following 70 genes:  AIP*, ALK, APC*, ATM*, AXIN2*, BAP1*, BARD1*, BLM*, BMPR1A*, BRCA1*, BRCA2*, BRIP1*, CDC73*, CDH1*, CDK4, CDKN1B*, CDKN2A, CHEK2*, CTNNA1*, DICER1*, EPCAM (del/dup only), EGFR, FH*, FLCN*, GREM1  (promoter dup only), HOXB13, KIT, LZTR1, MAX*, MBD4, MEN1*, MET, MITF, MLH1*, MSH2*, MSH3*, MSH6*, MUTYH*, NF1*, NF2*, NTHL1*, PALB2*, PDGFRA, PMS2*, POLD1*, POLE*, POT1*, PRKAR1A*, PTCH1*, PTEN*, RAD51C*, RAD51D*, RB1*, RET, SDHA* (sequencing only), SDHAF2*, SDHB*, SDHC*, SDHD*, SMAD4*, SMARCA4*, SMARCB1*, SMARCE1*, STK11*, SUFU*, TMEM127*, TP53*, TSC1*, TSC2*, VHL*. RNA analysis is performed for * genes.    07/20/2022 -  Chemotherapy   Patient is on Treatment Plan : BREAST ADJUVANT DOSE DENSE AC q14d / PACLitaxel q7d       CURRENT THERAPY: Taxol week 8  INTERVAL HISTORY: Nicole Maddox 42 y.o. female returns for f/u prior to treatment.  She is here for week 8 of her Taxol therapy.  She is tolerating this well.  Her only issue continues to be the right lower quadrant pain that she experiences about halfway through her treatment.   She is experiencing difficulty with sleeping.  She has difficulty with hot flashes that wake her up.  This has worsened over the past week. She is struggling with this, however she is open to taking something to help with the hot flashes and sleep.  She denies peripheral neuropathy.  Her constipation is resolved.    Patient Active Problem List   Diagnosis Date Noted   Port-A-Cath in place 07/20/2022   Genetic testing 07/07/2022   Family history of stomach cancer 06/29/2022   Malignant neoplasm of upper-outer quadrant of left female breast (HCC) 06/27/2022   Seborrheic dermatitis of scalp 02/25/2018    is allergic to adriamycin [doxorubicin].  MEDICAL HISTORY: Past Medical History:  Diagnosis Date   Breast cancer (HCC) 06/23/2022   Family history of stomach cancer    GERD (gastroesophageal reflux disease)  Hyperthyroidism    Thyroid disease     SURGICAL HISTORY: Past Surgical History:  Procedure Laterality Date   BREAST BIOPSY Left 06/23/2022   [place] LT BREAST BX W LOC DEV 1ST LESION IMG BX SPEC US GUIDE 06/23/2022 [facility] MAMMOGRAPHY   PORTACATH  PLACEMENT Right 07/19/2022   Procedure: INSERTION PORT-A-CATH;  Surgeon: Griselda Miner, MD;  Location: [facility];  Service: General;  Laterality: Right;  60 MIN ROOM 8   WISDOM TOOTH EXTRACTION      SOCIAL HISTORY: Social History   Socioeconomic History   Marital status: Single    Spouse name: Not on file   Number of children: Not on file   Years of education: Not on file   Highest education level: Not on file  Occupational History   Not on file  Tobacco Use   Smoking status: Never   Smokeless tobacco: Never  Vaping Use   Vaping Use: Never used  Substance and Sexual Activity   Alcohol use: Yes    Comment: socially   Drug use: No   Sexual activity: Yes    Partners: Male    Birth control/protection: None    Comment: intercourse age 88, more than 6 sexual parters,   Other Topics Concern   Not on file  Social History Narrative   Not on file   Social Determinants of Health   Financial Resource Strain: Low Risk  (07/04/2022)   Overall Financial Resource Strain (CARDIA)    Difficulty of Paying Living Expenses: Not very hard  Food Insecurity: No Food Insecurity (06/28/2022)   Hunger Vital Sign    Worried About Running Out of Food in the Last Year: Never true    Ran Out of Food in the Last Year: Never true  Transportation Needs: No Transportation Needs (06/28/2022)   PRAPARE - Administrator, Civil Service (Medical): No    Lack of Transportation (Non-Medical): No  Physical Activity: Not on file  Stress: Not on file  Social Connections: Not on file  Intimate Partner Violence: Not At Risk (06/28/2022)   Humiliation, Afraid, Rape, and Kick questionnaire    Fear of Current or Ex-Partner: No    Emotionally Abused: No    Physically Abused: No    Sexually Abused: No    FAMILY HISTORY: Family History  Problem Relation Age of Onset   Heart attack Father 42   Stomach cancer Maternal Aunt        dx > 50   Throat cancer Maternal Uncle        dx 40s    Stomach cancer Maternal Uncle        dx > 50   Heart Problems Maternal Grandmother    Breast cancer Cousin        mother's maternal first cousin    Review of Systems  Constitutional:  Positive for fatigue. Negative for appetite change, chills, fever and unexpected weight change.  HENT:   Negative for hearing loss, lump/mass and trouble swallowing.   Eyes:  Negative for eye problems and icterus.  Respiratory:  Negative for chest tightness, cough and shortness of breath.   Cardiovascular:  Negative for chest pain, leg swelling and palpitations.  Gastrointestinal:  Negative for abdominal distention, abdominal pain, constipation, diarrhea, nausea and vomiting.  Endocrine: Positive for hot flashes.  Genitourinary:  Negative for difficulty urinating.   Musculoskeletal:  Negative for arthralgias.  Skin:  Negative for itching and rash.  Neurological:  Negative for dizziness, extremity weakness,  headaches and numbness.  Hematological:  Negative for adenopathy. Does not bruise/bleed easily.  Psychiatric/Behavioral:  Positive for sleep disturbance. Negative for depression. The patient is not nervous/anxious.       PHYSICAL EXAMINATION  Vitals:   11/02/22 1235  BP: 113/68  Pulse: 78  Resp: 18  Temp: 98.6 F (37 C)  SpO2: 100%    Physical Exam Constitutional:      General: She is not in acute distress.    Appearance: Normal appearance. She is not toxic-appearing.  HENT:     Head: Normocephalic and atraumatic.     Mouth/Throat:     Mouth: Mucous membranes are moist.     Pharynx: Oropharynx is clear. No oropharyngeal exudate or posterior oropharyngeal erythema.  Eyes:     General: No scleral icterus. Cardiovascular:     Rate and Rhythm: Normal rate and regular rhythm.     Pulses: Normal pulses.     Heart sounds: Normal heart sounds.  Pulmonary:     Effort: Pulmonary effort is normal.     Breath sounds: Normal breath sounds.  Abdominal:     General: Abdomen is flat. Bowel  sounds are normal. There is no distension.     Palpations: Abdomen is soft.     Tenderness: There is no abdominal tenderness.  Musculoskeletal:        General: No swelling.     Cervical back: Neck supple.  Lymphadenopathy:     Cervical: No cervical adenopathy.  Skin:    General: Skin is warm and dry.     Findings: No rash.  Neurological:     General: No focal deficit present.     Mental Status: She is alert.  Psychiatric:        Mood and Affect: Mood normal.        Behavior: Behavior normal.    LABORATORY DATA:  CBC    Component Value Date/Time   WBC 3.8 (L) 11/02/2022 1141   WBC 15.4 (H) 07/22/2022 1535   RBC 3.75 (L) 11/02/2022 1141   HGB 10.2 (L) 11/02/2022 1141   HCT 31.2 (L) 11/02/2022 1141   PLT 293 11/02/2022 1141   MCV 83.2 11/02/2022 1141   MCH 27.2 11/02/2022 1141   MCHC 32.7 11/02/2022 1141   RDW 18.7 (H) 11/02/2022 1141   LYMPHSABS 1.3 11/02/2022 1141   MONOABS 0.2 11/02/2022 1141   EOSABS 0.1 11/02/2022 1141   BASOSABS 0.0 11/02/2022 1141    CMP     Component Value Date/Time   NA 139 11/02/2022 1141   K 3.8 11/02/2022 1141   CL 105 11/02/2022 1141   CO2 28 11/02/2022 1141   GLUCOSE 141 (H) 11/02/2022 1141   BUN 10 11/02/2022 1141   CREATININE 0.70 11/02/2022 1141   CALCIUM 8.7 (L) 11/02/2022 1141   PROT 6.7 11/02/2022 1141   ALBUMIN 4.0 11/02/2022 1141   AST 19 11/02/2022 1141   ALT 17 11/02/2022 1141   ALKPHOS 61 11/02/2022 1141   BILITOT 0.3 11/02/2022 1141   GFRNONAA >60 11/02/2022 1141       PENDING LABS:   RADIOGRAPHIC STUDIES:  No results found.   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   Treatment Plan: Neoadjuvant chemotherapy with Adriamycin/Cytoxan  every  2 weeks x 4 followed by weekly Taxol x 12.  Breast Conserving surgery with sentinel node biopsy Adjuvant Radiation therapy Antiestrogen therapy  Chemo toxicities:  Fatigue-managing with energy conservation Dehydration-resolved Constipation: Continue PRN Senokot S and Miralax. Nail dyscrasia: Stable. RLQ discomfort during treatment: Lorazepam with treatment today Sleep pattern disturbance related to hot flashes at night.  This likely will improve with time she will let me know if she wants gabapentin to take at night to help with the hot flashes.   She will RTC in 1 week for labs, f/u, and treatment.     All questions were answered. The patient knows to call the clinic with any problems, questions or concerns. We can certainly see the patient much sooner if necessary.  Total encounter time:20 minutes*in face-to-face visit time, chart review, lab review, care coordination, order entry, and documentation of the encounter time.    Lillard Anes, NP 11/02/22 5:07 PM Medical Oncology and Hematology [facility] [address] [place], [place] [address] Tel. [phone]    Fax. [phone]  *Total Encounter Time as defined by the Centers for Medicare and Medicaid Services includes, in addition to the face-to-face time of a patient visit (documented in the note above) non-face-to-face time: obtaining and reviewing outside history, ordering and reviewing medications, tests or procedures, care coordination (communications with other health care professionals or caregivers) and documentation in the medical record.

## 2022-11-02 NOTE — Assessment & Plan Note (Addendum)
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   Treatment Plan: Neoadjuvant chemotherapy with Adriamycin/Cytoxan every 2 weeks x 4 followed by weekly Taxol x 12.  Breast Conserving surgery with sentinel node biopsy Adjuvant Radiation therapy Antiestrogen therapy  Chemo toxicities:  Fatigue-managing with energy conservation Dehydration-resolved Constipation: Continue PRN Senokot S and Miralax. Nail dyscrasia: Stable. RLQ discomfort during treatment: Lorazepam with treatment today Sleep pattern disturbance related to hot flashes at night.  This likely will improve with time she will let me know if she wants gabapentin to take at night to help with the hot flashes.   She will RTC in 1 week for labs, f/u, and treatment.

## 2022-11-08 MED FILL — Dexamethasone Sodium Phosphate Inj 100 MG/10ML: INTRAMUSCULAR | Qty: 1 | Status: AC

## 2022-11-09 ENCOUNTER — Inpatient Hospital Stay: Payer: Managed Care, Other (non HMO) | Attending: Hematology and Oncology

## 2022-11-09 ENCOUNTER — Other Ambulatory Visit: Payer: Self-pay | Admitting: *Deleted

## 2022-11-09 ENCOUNTER — Inpatient Hospital Stay: Payer: Managed Care, Other (non HMO)

## 2022-11-09 ENCOUNTER — Inpatient Hospital Stay: Payer: Managed Care, Other (non HMO) | Admitting: Hematology and Oncology

## 2022-11-09 VITALS — BP 117/79 | HR 96 | Resp 20

## 2022-11-09 DIAGNOSIS — Z170 Estrogen receptor positive status [ER+]: Secondary | ICD-10-CM | POA: Diagnosis not present

## 2022-11-09 DIAGNOSIS — Z95828 Presence of other vascular implants and grafts: Secondary | ICD-10-CM

## 2022-11-09 DIAGNOSIS — R109 Unspecified abdominal pain: Secondary | ICD-10-CM | POA: Diagnosis not present

## 2022-11-09 DIAGNOSIS — Z5111 Encounter for antineoplastic chemotherapy: Secondary | ICD-10-CM | POA: Insufficient documentation

## 2022-11-09 DIAGNOSIS — C50412 Malignant neoplasm of upper-outer quadrant of left female breast: Secondary | ICD-10-CM

## 2022-11-09 LAB — CMP (CANCER CENTER ONLY)
ALT: 20 U/L (ref 0–44)
AST: 20 U/L (ref 15–41)
Albumin: 4 g/dL (ref 3.5–5.0)
Alkaline Phosphatase: 61 U/L (ref 38–126)
Anion gap: 5 (ref 5–15)
BUN: 6 mg/dL (ref 6–20)
CO2: 28 mmol/L (ref 22–32)
Calcium: 8.8 mg/dL — ABNORMAL LOW (ref 8.9–10.3)
Chloride: 107 mmol/L (ref 98–111)
Creatinine: 0.71 mg/dL (ref 0.44–1.00)
GFR, Estimated: >60 mL/min (ref >60)
Glucose, Bld: 102 mg/dL — ABNORMAL HIGH (ref 70–99)
Potassium: 3.5 mmol/L (ref 3.5–5.1)
Sodium: 140 mmol/L (ref 135–145)
Total Bilirubin: 0.3 mg/dL (ref 0.3–1.2)
Total Protein: 6.5 g/dL (ref 6.5–8.1)

## 2022-11-09 LAB — CBC WITH DIFFERENTIAL (CANCER CENTER ONLY)
Abs Immature Granulocytes: 0.05 10*3/uL (ref 0.00–0.07)
Basophils Absolute: 0 10*3/uL (ref 0.0–0.1)
Basophils Relative: 1 %
Eosinophils Absolute: 0.1 10*3/uL (ref 0.0–0.5)
Eosinophils Relative: 4 %
HCT: 30.9 % — ABNORMAL LOW (ref 36.0–46.0)
Hemoglobin: 10.5 g/dL — ABNORMAL LOW (ref 12.0–15.0)
Immature Granulocytes: 1 %
Lymphocytes Relative: 38 %
Lymphs Abs: 1.5 10*3/uL (ref 0.7–4.0)
MCH: 28.1 pg (ref 26.0–34.0)
MCHC: 34 g/dL (ref 30.0–36.0)
MCV: 82.6 fL (ref 80.0–100.0)
Monocytes Absolute: 0.3 10*3/uL (ref 0.1–1.0)
Monocytes Relative: 8 %
Neutro Abs: 1.9 10*3/uL (ref 1.7–7.7)
Neutrophils Relative %: 48 %
Platelet Count: 296 10*3/uL (ref 150–400)
RBC: 3.74 MIL/uL — ABNORMAL LOW (ref 3.87–5.11)
RDW: 19.4 % — ABNORMAL HIGH (ref 11.5–15.5)
WBC Count: 3.9 10*3/uL — ABNORMAL LOW (ref 4.0–10.5)
nRBC: 0 % (ref 0.0–0.2)

## 2022-11-09 MED ORDER — HEPARIN SOD (PORK) LOCK FLUSH 100 UNIT/ML IV SOLN
500.0000 [IU] | Freq: Once | INTRAVENOUS | Status: AC | PRN
  Administered 2022-11-09: 500 [IU]

## 2022-11-09 MED ORDER — LORAZEPAM 2 MG/ML IJ SOLN
1.0000 mg | Freq: Once | INTRAMUSCULAR | Status: AC
  Administered 2022-11-09: 1 mg via INTRAVENOUS
  Filled 2022-11-09: qty 1

## 2022-11-09 MED ORDER — METHYLPREDNISOLONE SODIUM SUCC 125 MG IJ SOLR
60.0000 mg | INTRAMUSCULAR | Status: DC | PRN
  Administered 2022-11-09 (×2): 60 mg via INTRAVENOUS
  Filled 2022-11-09: qty 2

## 2022-11-09 MED ORDER — SODIUM CHLORIDE 0.9% FLUSH
10.0000 mL | INTRAVENOUS | Status: DC | PRN
  Administered 2022-11-09: 10 mL

## 2022-11-09 MED ORDER — SODIUM CHLORIDE 0.9 % IV SOLN: Freq: Once | INTRAVENOUS | Status: AC

## 2022-11-09 MED ORDER — METHYLPREDNISOLONE SODIUM SUCC 125 MG IJ SOLR: 125.0000 mg | Freq: Once | INTRAMUSCULAR | Status: DC | PRN

## 2022-11-09 MED ORDER — SODIUM CHLORIDE 0.9 % IV SOLN
10.0000 mg | Freq: Once | INTRAVENOUS | Status: AC
  Administered 2022-11-09: 10 mg via INTRAVENOUS
  Filled 2022-11-09: qty 10

## 2022-11-09 MED ORDER — SODIUM CHLORIDE 0.9 % IV SOLN
80.0000 mg/m2 | Freq: Once | INTRAVENOUS | Status: AC
  Administered 2022-11-09: 150 mg via INTRAVENOUS
  Filled 2022-11-09: qty 25

## 2022-11-09 MED ORDER — FAMOTIDINE IN NACL 20-0.9 MG/50ML-% IV SOLN
20.0000 mg | Freq: Once | INTRAVENOUS | Status: AC
  Administered 2022-11-09: 20 mg via INTRAVENOUS
  Filled 2022-11-09: qty 50

## 2022-11-09 MED ORDER — SODIUM CHLORIDE 0.9% FLUSH
10.0000 mL | Freq: Once | INTRAVENOUS | Status: AC
  Administered 2022-11-09: 10 mL

## 2022-11-09 MED ORDER — DIPHENHYDRAMINE HCL 50 MG/ML IJ SOLN
50.0000 mg | Freq: Once | INTRAMUSCULAR | Status: AC
  Administered 2022-11-09: 50 mg via INTRAVENOUS
  Filled 2022-11-09: qty 1

## 2022-11-09 NOTE — Progress Notes (Signed)
Pt given pre-meds as ordered, except 60mg  solumedrol, which was given immediately before taxol. 30 minutes after taxol started, pt given 1mg  ativan. 5 min before the end of treatment pt c/o severe abdominal cramping. MD notified. Additional 60mg  solumedrol given. Pt symptoms were relieved. Pt discharge in stable condition.

## 2022-11-09 NOTE — Progress Notes (Signed)
[facility] Cancer Follow up:    [name], MD [address] 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  The report date is July 05, 2022. Negative genetic testing on the Multi-cancer panel.  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  BLM c.3136G>A (p.Gly1046Ser), EGFR c.61G>A (p.Ala21Thr) and PDGFRA c.50G>T (p.Gly17Val) VUS identified.  The report date is July 11, 2022.  The STAT Breast cancer panel offered by [facility] includes sequencing and rearrangement analysis for the following 9 genes:  ATM, BRCA1, BRCA2, CDH1, CHEK2, PALB2, PTEN, STK11 and TP53.   The Multi-Cancer + RNA Panel offered by [facility] includes sequencing and/or deletion/duplication analysis of the following 70 genes:  AIP*, ALK, APC*, ATM*, AXIN2*, BAP1*, BARD1*, BLM*, BMPR1A*, BRCA1*, BRCA2*, BRIP1*, CDC73*, CDH1*, CDK4, CDKN1B*, CDKN2A, CHEK2*, CTNNA1*, DICER1*, EPCAM (del/dup only), EGFR, FH*, FLCN*, GREM1  (promoter dup only), HOXB13, KIT, LZTR1, MAX*, MBD4, MEN1*, MET, MITF, MLH1*, MSH2*, MSH3*, MSH6*, MUTYH*, NF1*, NF2*, NTHL1*, PALB2*, PDGFRA, PMS2*, POLD1*, POLE*, POT1*, PRKAR1A*, PTCH1*, PTEN*, RAD51C*, RAD51D*, RB1*, RET, SDHA* (sequencing only), SDHAF2*, SDHB*, SDHC*, SDHD*, SMAD4*, SMARCA4*, SMARCB1*, SMARCE1*, STK11*, SUFU*, TMEM127*, TP53*, TSC1*, TSC2*, VHL*. RNA analysis is performed for * genes.    07/20/2022 -  Chemotherapy   Patient is on Treatment Plan : BREAST ADJUVANT DOSE DENSE AC q14d / PACLitaxel q7d       CURRENT THERAPY: Taxol week 8  INTERVAL HISTORY: [name] 42 y.o. female returns for f/u prior to treatment.   She has abdominal cramps half way during the infusion, xanax worked well. Besides abdominal cramps, she is having some hot flashes, difficulty sleeping and mild intermittent cough without SOB for 2 weeks. She can't feel any mass in her breast No neuropathy reported. Rest of the pertinent 10 point ROS reviewed and neg  Patient Active Problem List   Diagnosis Date Noted   Port-A-Cath in place 07/20/2022   Genetic testing 07/07/2022   Family history of stomach cancer 06/29/2022   Malignant neoplasm of upper-outer quadrant of left female breast (HCC) 06/27/2022   Seborrheic dermatitis of scalp 02/25/2018    is allergic to adriamycin [doxorubicin].  MEDICAL HISTORY: Past Medical History:  Diagnosis Date   Breast cancer (HCC) 06/23/2022   Family history of stomach cancer    GERD (gastroesophageal reflux disease)    Hyperthyroidism    Thyroid disease     SURGICAL HISTORY: Past Surgical History:  Procedure Laterality Date   BREAST BIOPSY Left 06/23/2022   [place] LT BREAST BX W LOC DEV 1ST LESION IMG BX SPEC  US GUIDE 06/23/2022 [facility] MAMMOGRAPHY   PORTACATH PLACEMENT Right 07/19/2022   Procedure: INSERTION PORT-A-CATH;  Surgeon: Griselda Miner, MD;  Location: [facility];  Service: General;  Laterality: Right;  60 MIN ROOM 8   WISDOM  TOOTH EXTRACTION      SOCIAL HISTORY: Social History   Socioeconomic History   Marital status: Single    Spouse name: Not on file   Number of children: Not on file   Years of education: Not on file   Highest education level: Not on file  Occupational History   Not on file  Tobacco Use   Smoking status: Never   Smokeless tobacco: Never  Vaping Use   Vaping Use: Never used  Substance and Sexual Activity   Alcohol use: Yes    Comment: socially   Drug use: No   Sexual activity: Yes    Partners: Male    Birth control/protection: None    Comment: intercourse age 77, more than 6 sexual parters,   Other Topics Concern   Not on file  Social History Narrative   Not on file   Social Determinants of Health   Financial Resource Strain: Low Risk  (07/04/2022)   Overall Financial Resource Strain (CARDIA)    Difficulty of Paying Living Expenses: Not very hard  Food Insecurity: No Food Insecurity (06/28/2022)   Hunger Vital Sign    Worried About Running Out of Food in the Last Year: Never true    Ran Out of Food in the Last Year: Never true  Transportation Needs: No Transportation Needs (06/28/2022)   PRAPARE - Administrator, Civil Service (Medical): No    Lack of Transportation (Non-Medical): No  Physical Activity: Not on file  Stress: Not on file  Social Connections: Not on file  Intimate Partner Violence: Not At Risk (06/28/2022)   Humiliation, Afraid, Rape, and Kick questionnaire    Fear of Current or Ex-Partner: No    Emotionally Abused: No    Physically Abused: No    Sexually Abused: No    FAMILY HISTORY: Family History  Problem Relation Age of Onset   Heart attack Father 25   Stomach cancer Maternal Aunt        dx > 50   Throat cancer Maternal Uncle        dx 57s   Stomach cancer Maternal Uncle        dx > 50   Heart Problems Maternal Grandmother    Breast cancer Cousin        mother's maternal first cousin    Review of Systems  Constitutional:   Positive for fatigue. Negative for appetite change, chills, fever and unexpected weight change.  HENT:   Negative for hearing loss, lump/mass and trouble swallowing.   Eyes:  Negative for eye problems and icterus.  Respiratory:  Negative for chest tightness, cough and shortness of breath.   Cardiovascular:  Negative for chest pain, leg swelling and palpitations.  Gastrointestinal:  Negative for abdominal distention, abdominal pain, constipation, diarrhea, nausea and vomiting.  Endocrine: Positive for hot flashes.  Genitourinary:  Negative for difficulty urinating.   Musculoskeletal:  Negative for arthralgias.  Skin:  Negative for itching and rash.  Neurological:  Negative for dizziness, extremity weakness, headaches and numbness.  Hematological:  Negative for adenopathy. Does not bruise/bleed easily.  Psychiatric/Behavioral:  Positive for sleep disturbance. Negative for depression. The patient is not nervous/anxious.       PHYSICAL EXAMINATION  Vitals:  11/09/22 1211  BP: 119/73  Pulse: 83  Resp: 18  Temp: 97.7 F (36.5 C)  SpO2: 100%    Physical Exam Constitutional:      General: She is not in acute distress.    Appearance: Normal appearance. She is not toxic-appearing.  HENT:     Head: Normocephalic and atraumatic.     Mouth/Throat:     Mouth: Mucous membranes are moist.     Pharynx: Oropharynx is clear. No oropharyngeal exudate or posterior oropharyngeal erythema.  Eyes:     General: No scleral icterus. Cardiovascular:     Rate and Rhythm: Normal rate and regular rhythm.     Pulses: Normal pulses.     Heart sounds: Normal heart sounds.  Pulmonary:     Effort: Pulmonary effort is normal.     Breath sounds: Normal breath sounds.  Abdominal:     General: Abdomen is flat. Bowel sounds are normal. There is no distension.     Palpations: Abdomen is soft.     Tenderness: There is no abdominal tenderness.  Musculoskeletal:        General: No swelling.     Cervical  back: Neck supple.  Lymphadenopathy:     Cervical: No cervical adenopathy.  Skin:    General: Skin is warm and dry.     Findings: No rash.  Neurological:     General: No focal deficit present.     Mental Status: She is alert.  Psychiatric:        Mood and Affect: Mood normal.        Behavior: Behavior normal.    LABORATORY DATA:  CBC    Component Value Date/Time   WBC 3.9 (L) 11/09/2022 1145   WBC 15.4 (H) 07/22/2022 1535   RBC 3.74 (L) 11/09/2022 1145   HGB 10.5 (L) 11/09/2022 1145   HCT 30.9 (L) 11/09/2022 1145   PLT 296 11/09/2022 1145   MCV 82.6 11/09/2022 1145   MCH 28.1 11/09/2022 1145   MCHC 34.0 11/09/2022 1145   RDW 19.4 (H) 11/09/2022 1145   LYMPHSABS PENDING 11/09/2022 1145   MONOABS PENDING 11/09/2022 1145   EOSABS PENDING 11/09/2022 1145   BASOSABS PENDING 11/09/2022 1145    CMP     Component Value Date/Time   NA 139 11/02/2022 1141   K 3.8 11/02/2022 1141   CL 105 11/02/2022 1141   CO2 28 11/02/2022 1141   GLUCOSE 141 (H) 11/02/2022 1141   BUN 10 11/02/2022 1141   CREATININE 0.70 11/02/2022 1141   CALCIUM 8.7 (L) 11/02/2022 1141   PROT 6.7 11/02/2022 1141   ALBUMIN 4.0 11/02/2022 1141   AST 19 11/02/2022 1141   ALT 17 11/02/2022 1141   ALKPHOS 61 11/02/2022 1141   BILITOT 0.3 11/02/2022 1141   GFRNONAA >60 11/02/2022 1141       PENDING LABS:   RADIOGRAPHIC STUDIES:  No results found.   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  She is now on weekly paclitaxel for chemotherapy. She has some abdominal cramps during taxol infusion, this is better with xanax, otherwise, no complaints. PE, complete response,  no palpable mass. Ok to proceed with chemo as planned if labs are within parameters   #  2 Drug induced constipation, ok to use PRN stool softeners and laxatives   #3 Anemia secondary to chemotherapy, no indication to transfuse, will continue to monitor.   #4 mild,dry cough. No crackles. O 2 sat 100%, she will continue to monitor this.  All questions were answered. The patient knows to call the clinic with any problems, questions or concerns. We can certainly see the patient much sooner if necessary.  Total encounter time:30 minutes*in face-to-face visit time, chart review, lab review, care coordination, order entry, and documentation of the encounter time.  *Total Encounter Time as defined by the Centers for Medicare and Medicaid Services includes, in addition to the face-to-face time of a patient visit (documented in the note above) non-face-to-face time: obtaining and reviewing outside history, ordering and reviewing medications, tests or procedures, care coordination (communications with other health care professionals or caregivers) and documentation in the medical record.

## 2022-11-09 NOTE — Patient Instructions (Signed)
[facility]  Discharge Instructions: Thank you for choosing [facility] to provide your oncology and hematology care.   If you have a lab appointment with the Cancer Center, please go directly to the Cancer Center and check in at the registration area.   Wear comfortable clothing and clothing appropriate for easy access to any Portacath or PICC line.   We strive to give you quality time with your provider. You may need to reschedule your appointment if you arrive late (15 or more minutes).  Arriving late affects you and other patients whose appointments are after yours.  Also, if you miss three or more appointments without notifying the office, you may be dismissed from the clinic at the provider's discretion.      For prescription refill requests, have your pharmacy contact our office and allow 72 hours for refills to be completed.    Today you received the following chemotherapy and/or immunotherapy agents: paclitaxel      To help prevent nausea and vomiting after your treatment, we encourage you to take your nausea medication as directed.  BELOW ARE SYMPTOMS THAT SHOULD BE REPORTED IMMEDIATELY: *FEVER GREATER THAN 100.4 F (38 C) OR HIGHER *CHILLS OR SWEATING *NAUSEA AND VOMITING THAT IS NOT CONTROLLED WITH YOUR NAUSEA MEDICATION *UNUSUAL SHORTNESS OF BREATH *UNUSUAL BRUISING OR BLEEDING *URINARY PROBLEMS (pain or burning when urinating, or frequent urination) *BOWEL PROBLEMS (unusual diarrhea, constipation, pain near the anus) TENDERNESS IN MOUTH AND THROAT WITH OR WITHOUT PRESENCE OF ULCERS (sore throat, sores in mouth, or a toothache) UNUSUAL RASH, SWELLING OR PAIN  UNUSUAL VAGINAL DISCHARGE OR ITCHING   Items with * indicate a potential emergency and should be followed up as soon as possible or go to the Emergency Department if any problems should occur.  Please show the CHEMOTHERAPY ALERT CARD or IMMUNOTHERAPY ALERT CARD at  check-in to the Emergency Department and triage nurse.  Should you have questions after your visit or need to cancel or reschedule your appointment, please contact [facility]  Dept: [phone]  and follow the prompts.  Office hours are 8:00 a.m. to 4:30 p.m. Monday - Friday. Please note that voicemails left after 4:00 p.m. may not be returned until the following business day.  We are closed weekends and major holidays. You have access to a nurse at all times for urgent questions. Please call the main number to the clinic Dept: [phone] and follow the prompts.   For any non-urgent questions, you may also contact your provider using MyChart. We now offer e-Visits for anyone 18 and older to request care online for non-urgent symptoms. For details visit [email or web address].   Also download the MyChart app! Go to the app store, search "MyChart", open the app, select , and log in with your MyChart username and password.   

## 2022-11-16 ENCOUNTER — Inpatient Hospital Stay: Payer: Managed Care, Other (non HMO)

## 2022-11-16 ENCOUNTER — Inpatient Hospital Stay (HOSPITAL_BASED_OUTPATIENT_CLINIC_OR_DEPARTMENT_OTHER): Payer: Managed Care, Other (non HMO) | Admitting: Hematology and Oncology

## 2022-11-16 ENCOUNTER — Other Ambulatory Visit: Payer: Self-pay

## 2022-11-16 VITALS — BP 113/82 | HR 93 | Resp 16

## 2022-11-16 DIAGNOSIS — C50412 Malignant neoplasm of upper-outer quadrant of left female breast: Secondary | ICD-10-CM

## 2022-11-16 DIAGNOSIS — Z5111 Encounter for antineoplastic chemotherapy: Secondary | ICD-10-CM | POA: Diagnosis not present

## 2022-11-16 DIAGNOSIS — Z95828 Presence of other vascular implants and grafts: Secondary | ICD-10-CM

## 2022-11-16 DIAGNOSIS — Z170 Estrogen receptor positive status [ER+]: Secondary | ICD-10-CM

## 2022-11-16 LAB — CBC WITH DIFFERENTIAL (CANCER CENTER ONLY)
Abs Immature Granulocytes: 0.07 10*3/uL (ref 0.00–0.07)
Basophils Absolute: 0.1 10*3/uL (ref 0.0–0.1)
Basophils Relative: 1 %
Eosinophils Absolute: 0.2 10*3/uL (ref 0.0–0.5)
Eosinophils Relative: 4 %
HCT: 32.5 % — ABNORMAL LOW (ref 36.0–46.0)
Hemoglobin: 10.7 g/dL — ABNORMAL LOW (ref 12.0–15.0)
Immature Granulocytes: 2 %
Lymphocytes Relative: 34 %
Lymphs Abs: 1.6 10*3/uL (ref 0.7–4.0)
MCH: 27.6 pg (ref 26.0–34.0)
MCHC: 32.9 g/dL (ref 30.0–36.0)
MCV: 83.8 fL (ref 80.0–100.0)
Monocytes Absolute: 0.4 10*3/uL (ref 0.1–1.0)
Monocytes Relative: 8 %
Neutro Abs: 2.5 10*3/uL (ref 1.7–7.7)
Neutrophils Relative %: 51 %
Platelet Count: 314 10*3/uL (ref 150–400)
RBC: 3.88 MIL/uL (ref 3.87–5.11)
RDW: 18.9 % — ABNORMAL HIGH (ref 11.5–15.5)
WBC Count: 4.7 10*3/uL (ref 4.0–10.5)
nRBC: 0 % (ref 0.0–0.2)

## 2022-11-16 LAB — CMP (CANCER CENTER ONLY)
ALT: 18 U/L (ref 0–44)
AST: 17 U/L (ref 15–41)
Albumin: 3.8 g/dL (ref 3.5–5.0)
Alkaline Phosphatase: 59 U/L (ref 38–126)
Anion gap: 5 (ref 5–15)
BUN: 6 mg/dL (ref 6–20)
CO2: 28 mmol/L (ref 22–32)
Calcium: 9 mg/dL (ref 8.9–10.3)
Chloride: 107 mmol/L (ref 98–111)
Creatinine: 0.63 mg/dL (ref 0.44–1.00)
GFR, Estimated: >60 mL/min (ref >60)
Glucose, Bld: 79 mg/dL (ref 70–99)
Potassium: 3.8 mmol/L (ref 3.5–5.1)
Sodium: 140 mmol/L (ref 135–145)
Total Bilirubin: 0.2 mg/dL — ABNORMAL LOW (ref 0.3–1.2)
Total Protein: 6.8 g/dL (ref 6.5–8.1)

## 2022-11-16 MED ORDER — SODIUM CHLORIDE 0.9 % IV SOLN
10.0000 mg | Freq: Once | INTRAVENOUS | Status: AC
  Administered 2022-11-16: 10 mg via INTRAVENOUS
  Filled 2022-11-16: qty 10

## 2022-11-16 MED ORDER — METHYLPREDNISOLONE SODIUM SUCC 125 MG IJ SOLR
60.0000 mg | INTRAMUSCULAR | Status: DC | PRN
  Administered 2022-11-16: 60 mg via INTRAVENOUS
  Filled 2022-11-16: qty 2

## 2022-11-16 MED ORDER — DIPHENHYDRAMINE HCL 50 MG/ML IJ SOLN
50.0000 mg | Freq: Once | INTRAMUSCULAR | Status: AC
  Administered 2022-11-16: 50 mg via INTRAVENOUS
  Filled 2022-11-16: qty 1

## 2022-11-16 MED ORDER — SODIUM CHLORIDE 0.9% FLUSH
10.0000 mL | INTRAVENOUS | Status: DC | PRN
  Administered 2022-11-16: 10 mL

## 2022-11-16 MED ORDER — SODIUM CHLORIDE 0.9 % IV SOLN
80.0000 mg/m2 | Freq: Once | INTRAVENOUS | Status: AC
  Administered 2022-11-16: 150 mg via INTRAVENOUS
  Filled 2022-11-16: qty 25

## 2022-11-16 MED ORDER — SODIUM CHLORIDE 0.9 % IV SOLN: Freq: Once | INTRAVENOUS | Status: AC

## 2022-11-16 MED ORDER — HEPARIN SOD (PORK) LOCK FLUSH 100 UNIT/ML IV SOLN
500.0000 [IU] | Freq: Once | INTRAVENOUS | Status: AC | PRN
  Administered 2022-11-16: 500 [IU]

## 2022-11-16 MED ORDER — SODIUM CHLORIDE 0.9% FLUSH
10.0000 mL | Freq: Once | INTRAVENOUS | Status: AC
  Administered 2022-11-16: 10 mL

## 2022-11-16 MED ORDER — FAMOTIDINE IN NACL 20-0.9 MG/50ML-% IV SOLN
20.0000 mg | Freq: Once | INTRAVENOUS | Status: AC
  Administered 2022-11-16: 20 mg via INTRAVENOUS
  Filled 2022-11-16: qty 50

## 2022-11-16 MED ORDER — LORAZEPAM 2 MG/ML IJ SOLN
1.0000 mg | Freq: Once | INTRAMUSCULAR | Status: AC
  Administered 2022-11-16: 1 mg via INTRAVENOUS
  Filled 2022-11-16: qty 1

## 2022-11-16 NOTE — Patient Instructions (Signed)
[facility]  Discharge Instructions: Thank you for choosing [facility] to provide your oncology and hematology care.   If you have a lab appointment with the Cancer Center, please go directly to the Cancer Center and check in at the registration area.   Wear comfortable clothing and clothing appropriate for easy access to any Portacath or PICC line.   We strive to give you quality time with your provider. You may need to reschedule your appointment if you arrive late (15 or more minutes).  Arriving late affects you and other patients whose appointments are after yours.  Also, if you miss three or more appointments without notifying the office, you may be dismissed from the clinic at the provider's discretion.      For prescription refill requests, have your pharmacy contact our office and allow 72 hours for refills to be completed.    Today you received the following chemotherapy and/or immunotherapy agents Paclitaxel      To help prevent nausea and vomiting after your treatment, we encourage you to take your nausea medication as directed.  BELOW ARE SYMPTOMS THAT SHOULD BE REPORTED IMMEDIATELY: *FEVER GREATER THAN 100.4 F (38 C) OR HIGHER *CHILLS OR SWEATING *NAUSEA AND VOMITING THAT IS NOT CONTROLLED WITH YOUR NAUSEA MEDICATION *UNUSUAL SHORTNESS OF BREATH *UNUSUAL BRUISING OR BLEEDING *URINARY PROBLEMS (pain or burning when urinating, or frequent urination) *BOWEL PROBLEMS (unusual diarrhea, constipation, pain near the anus) TENDERNESS IN MOUTH AND THROAT WITH OR WITHOUT PRESENCE OF ULCERS (sore throat, sores in mouth, or a toothache) UNUSUAL RASH, SWELLING OR PAIN  UNUSUAL VAGINAL DISCHARGE OR ITCHING   Items with * indicate a potential emergency and should be followed up as soon as possible or go to the Emergency Department if any problems should occur.  Please show the CHEMOTHERAPY ALERT CARD or IMMUNOTHERAPY ALERT CARD at  check-in to the Emergency Department and triage nurse.  Should you have questions after your visit or need to cancel or reschedule your appointment, please contact [facility]  Dept: [phone]  and follow the prompts.  Office hours are 8:00 a.m. to 4:30 p.m. Monday - Friday. Please note that voicemails left after 4:00 p.m. may not be returned until the following business day.  We are closed weekends and major holidays. You have access to a nurse at all times for urgent questions. Please call the main number to the clinic Dept: [phone] and follow the prompts.   For any non-urgent questions, you may also contact your provider using MyChart. We now offer e-Visits for anyone 18 and older to request care online for non-urgent symptoms. For details visit [email or web address].   Also download the MyChart app! Go to the app store, search "MyChart", open the app, select , and log in with your MyChart username and password.   

## 2022-11-16 NOTE — Progress Notes (Signed)
[facility] Cancer Follow up:    Nicole Caffey, MD [address] [place] [address]   DIAGNOSIS:  Cancer Staging  Malignant neoplasm of upper-outer quadrant of left female breast [facility]) Staging form: Breast, AJCC 8th Edition - Clinical stage from 06/28/2022: Stage IB (cT1c, cN0(f), cM0, G3, ER+, PR-, HER2-) - Signed by Ronny Bacon, PA-C on 06/28/2022 Stage prefix: Initial diagnosis Method of lymph node assessment: Core biopsy Histologic grading system: 3 grade system   SUMMARY OF ONCOLOGIC HISTORY: Oncology History  Malignant neoplasm of upper-outer quadrant of left female breast (HCC)  06/28/2022 Initial Diagnosis   Primary malignant neoplasm of 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  The report date is July 05, 2022. Negative genetic testing on the Multi-cancer panel.  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  BLM c.3136G>A (p.Gly1046Ser), EGFR c.61G>A (p.Ala21Thr) and PDGFRA c.50G>T (p.Gly17Val) VUS identified.  The report date is July 11, 2022.  The STAT Breast cancer panel offered by [facility] includes sequencing and rearrangement analysis for the following 9 genes:  ATM, BRCA1, BRCA2, CDH1, CHEK2, PALB2, PTEN, STK11 and TP53.   The Multi-Cancer + RNA Panel offered by [facility] includes sequencing and/or deletion/duplication analysis of the following 70 genes:  AIP*, ALK, APC*, ATM*, AXIN2*, BAP1*, BARD1*, BLM*, BMPR1A*, BRCA1*, BRCA2*, BRIP1*, CDC73*, CDH1*, CDK4, CDKN1B*, CDKN2A, CHEK2*, CTNNA1*, DICER1*, EPCAM (del/dup only), EGFR, FH*, FLCN*, GREM1  (promoter dup only), HOXB13, KIT, LZTR1, MAX*, MBD4, MEN1*, MET, MITF, MLH1*, MSH2*, MSH3*, MSH6*, MUTYH*, NF1*, NF2*, NTHL1*, PALB2*, PDGFRA, PMS2*, POLD1*, POLE*, POT1*, PRKAR1A*, PTCH1*, PTEN*, RAD51C*, RAD51D*, RB1*, RET, SDHA* (sequencing only), SDHAF2*, SDHB*, SDHC*, SDHD*, SMAD4*, SMARCA4*, SMARCB1*, SMARCE1*, STK11*, SUFU*, TMEM127*, TP53*, TSC1*, TSC2*, VHL*. RNA analysis is performed for * genes.    07/20/2022 -  Chemotherapy   Patient is on Treatment Plan : BREAST ADJUVANT DOSE DENSE AC q14d / PACLitaxel q7d       CURRENT THERAPY: Taxol week 8  INTERVAL HISTORY:  Nicole Maddox 42 y.o. female returns for f/u prior to treatment.  She is here for follow-up before her weekly Taxol.  She tells me that she felt a new breast lump in the left breast yesterday.  She has not done any weightlifting per se.  She continues to have some abdominal cramps throughout the Taxol infusion which are manageable if she can take Xanax.  Other than that she has this anxiety attacks which starts as a chest pressure and then she has a heat wave that follows it and episode resolves.  These are not triggered by anything in particular.  She denies any neuropathy at all.  No change in breathing, change in bowel habits or urinary habits.  Rest of the pertinent 10 point ROS reviewed and neg  Patient Active Problem List   Diagnosis Date Noted   Port-A-Cath in place 07/20/2022   Genetic testing 07/07/2022   Family history of stomach cancer 06/29/2022   Malignant neoplasm of upper-outer quadrant of left female breast (HCC) 06/27/2022   Seborrheic dermatitis of scalp 02/25/2018    is allergic to adriamycin [doxorubicin].  MEDICAL HISTORY:  Past Medical History:  Diagnosis Date   Breast cancer (HCC) 06/23/2022   Family history of stomach cancer    GERD (gastroesophageal reflux disease)    Hyperthyroidism    Thyroid disease     SURGICAL HISTORY: Past Surgical History:  Procedure Laterality Date   BREAST  BIOPSY Left 06/23/2022   [place] LT BREAST BX W LOC DEV 1ST LESION IMG BX SPEC US GUIDE 06/23/2022 [facility] MAMMOGRAPHY   PORTACATH PLACEMENT Right 07/19/2022   Procedure: INSERTION PORT-A-CATH;  Surgeon: Griselda Miner, MD;  Location: [facility];  Service: General;  Laterality: Right;  60 MIN ROOM 8   WISDOM TOOTH EXTRACTION      SOCIAL HISTORY: Social History   Socioeconomic History   Marital status: Single    Spouse name: Not on file   Number of children: Not on file   Years of education: Not on file   Highest education level: Not on file  Occupational History   Not on file  Tobacco Use   Smoking status: Never   Smokeless tobacco: Never  Vaping Use   Vaping Use: Never used  Substance and Sexual Activity   Alcohol use: Yes    Comment: socially   Drug use: No   Sexual activity: Yes    Partners: Male    Birth control/protection: None    Comment: intercourse age 31, more than 6 sexual parters,   Other Topics Concern   Not on file  Social History Narrative   Not on file   Social Determinants of Health   Financial Resource Strain: Low Risk  (07/04/2022)   Overall Financial Resource Strain (CARDIA)    Difficulty of Paying Living Expenses: Not very hard  Food Insecurity: No Food Insecurity (06/28/2022)   Hunger Vital Sign    Worried About Running Out of Food in the Last Year: Never true    Ran Out of Food in the Last Year: Never true  Transportation Needs: No Transportation Needs (06/28/2022)   PRAPARE - Administrator, Civil Service (Medical): No    Lack of Transportation (Non-Medical): No  Physical Activity: Not on file  Stress: Not on file  Social Connections: Not on file  Intimate Partner Violence: Not At Risk (06/28/2022)   Humiliation, Afraid, Rape, and Kick questionnaire    Fear of Current or Ex-Partner: No    Emotionally Abused: No    Physically Abused: No    Sexually Abused: No    FAMILY HISTORY: Family History  Problem Relation Age of  Onset   Heart attack Father 65   Stomach cancer Maternal Aunt        dx > 50   Throat cancer Maternal Uncle        dx 27s   Stomach cancer Maternal Uncle        dx > 50   Heart Problems Maternal Grandmother    Breast cancer Cousin        mother's maternal first cousin    Review of Systems  Constitutional:  Positive for fatigue. Negative for appetite change, chills, fever and unexpected weight change.  HENT:   Negative for hearing loss, lump/mass and trouble swallowing.   Eyes:  Negative for eye problems and icterus.  Respiratory:  Negative for chest tightness, cough and shortness of breath.   Cardiovascular:  Negative for chest pain, leg swelling and palpitations.  Gastrointestinal:  Negative for abdominal distention, abdominal pain, constipation, diarrhea, nausea and vomiting.  Endocrine: Positive for hot flashes.  Genitourinary:  Negative for difficulty urinating.   Musculoskeletal:  Negative for arthralgias.  Skin:  Negative for itching and rash.  Neurological:  Negative for dizziness, extremity weakness, headaches and numbness.  Hematological:  Negative for adenopathy. Does not bruise/bleed easily.  Psychiatric/Behavioral:  Positive for sleep disturbance. Negative for depression. The patient is not nervous/anxious.       PHYSICAL EXAMINATION  Vitals:   11/16/22 1235  BP: 114/76  Pulse: 90  Resp: 18  Temp: (!) 97.3 F (36.3 C)  SpO2: 99%    Physical Exam Constitutional:      General: She is not in acute distress.    Appearance: Normal appearance. She is not toxic-appearing.  HENT:     Head: Normocephalic and atraumatic.     Mouth/Throat:     Mouth: Mucous membranes are moist.     Pharynx: Oropharynx is clear. No oropharyngeal exudate or posterior oropharyngeal erythema.  Eyes:     General: No scleral icterus. Cardiovascular:     Rate and Rhythm: Normal rate and regular rhythm.     Pulses: Normal pulses.     Heart sounds: Normal heart sounds.  Pulmonary:      Effort: Pulmonary effort is normal.     Breath sounds: Normal breath sounds.  Chest:  Breasts:    Left: Mass present. No swelling.       Comments: There is a palpable mass in the left upper outer quadrant at the very edge of the breast or lower axilla, easily mobile.  She had complete clinical response to chemotherapy so far with regards to the previous breast mass. Abdominal:     General: Abdomen is flat. Bowel sounds are normal. There is no distension.     Palpations: Abdomen is soft.     Tenderness: There is no abdominal tenderness.  Musculoskeletal:        General: No swelling.     Cervical back: Neck supple.  Lymphadenopathy:     Cervical: No cervical adenopathy.  Skin:    General: Skin is warm and dry.     Findings: No rash.  Neurological:     General: No focal deficit present.     Mental Status: She is alert.  Psychiatric:        Mood and Affect: Mood normal.        Behavior: Behavior normal.    LABORATORY DATA:  CBC    Component Value Date/Time   WBC 4.7 11/16/2022 1215   WBC 15.4 (H) 07/22/2022 1535   RBC 3.88 11/16/2022 1215   HGB 10.7 (L) 11/16/2022 1215   HCT 32.5 (L) 11/16/2022 1215   PLT 314 11/16/2022 1215   MCV 83.8 11/16/2022 1215   MCH 27.6 11/16/2022 1215   MCHC 32.9 11/16/2022 1215   RDW 18.9 (H) 11/16/2022 1215   LYMPHSABS 1.6 11/16/2022 1215   MONOABS 0.4 11/16/2022 1215   EOSABS 0.2 11/16/2022 1215   BASOSABS 0.1 11/16/2022 1215    CMP     Component Value Date/Time   NA 140 11/09/2022 1145   K 3.5 11/09/2022 1145   CL 107 11/09/2022 1145   CO2 28 11/09/2022 1145   GLUCOSE 102 (H) 11/09/2022 1145   BUN 6 11/09/2022 1145   CREATININE 0.71 11/09/2022 1145   CALCIUM 8.8 (L) 11/09/2022 1145   PROT 6.5 11/09/2022 1145   ALBUMIN 4.0 11/09/2022 1145   AST 20 11/09/2022 1145   ALT 20 11/09/2022 1145   ALKPHOS 61 11/09/2022 1145   BILITOT 0.3 11/09/2022  1145   GFRNONAA >60 11/09/2022 1145       PENDING LABS:   RADIOGRAPHIC  STUDIES:  No results found.   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  She is now on weekly paclitaxel for chemotherapy. PE, complete response, no palpable mass.  She is doing really well so far.  She has some intermittent abdominal cramps during infusion which are manageable while on Xanax. Today she however complains of a new breast lump in the left breast.  This is only felt in sitting position.  This is at the very outer edge of the breast or in the lower axilla, palpable mobile mass.  She is also going to see Dr. Carolynne Edouard this coming Tuesday hence have sent him an in basket message if he can kindly examine the area and let us know if this needs any further imaging.  This will be highly unusual for any kind of cancer progression while on chemotherapy especially when she had such great response in the area of breast cancer. Ok to proceed with chemo as planned if labs are within parameters   #Anemia secondary to chemotherapy, no indication to transfuse, will continue to monitor.    All questions were answered. The patient knows to call the clinic with any problems, questions or concerns. We can certainly see the patient much sooner if necessary.  Total encounter time:30 minutes*in face-to-face visit time, chart review, lab review, care coordination, order entry, and documentation of the encounter time.  *Total Encounter Time as defined by the Centers for Medicare and Medicaid Services includes, in addition to the face-to-face time of a patient visit (documented in the note above) non-face-to-face time: obtaining and reviewing outside history, ordering and reviewing  medications, tests or procedures, care coordination (communications with other health care professionals or caregivers) and documentation in the medical record.

## 2022-11-21 ENCOUNTER — Other Ambulatory Visit: Payer: Self-pay | Admitting: General Surgery

## 2022-11-21 DIAGNOSIS — C50412 Malignant neoplasm of upper-outer quadrant of left female breast: Secondary | ICD-10-CM

## 2022-11-21 DIAGNOSIS — N6320 Unspecified lump in the left breast, unspecified quadrant: Secondary | ICD-10-CM

## 2022-11-22 ENCOUNTER — Ambulatory Visit
Admission: RE | Admit: 2022-11-22 | Discharge: 2022-11-22 | Disposition: A | Discharge status: Home / Self Care | Payer: Managed Care, Other (non HMO) | Source: Ambulatory Visit | Attending: General Surgery | Admitting: General Surgery

## 2022-11-22 DIAGNOSIS — C50412 Malignant neoplasm of upper-outer quadrant of left female breast: Secondary | ICD-10-CM

## 2022-11-22 MED ORDER — GADOPICLENOL 0.5 MMOL/ML IV SOLN
8.0000 mL | Freq: Once | INTRAVENOUS | Status: AC | PRN
  Administered 2022-11-22: 8 mL via INTRAVENOUS

## 2022-11-22 MED FILL — Dexamethasone Sodium Phosphate Inj 100 MG/10ML: INTRAMUSCULAR | Qty: 1 | Status: AC

## 2022-11-22 NOTE — Assessment & Plan Note (Signed)
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  She denies any family history of breast cancer or ovarian cancer in the immediate family, may have a second cousin with breast cancer. We think she will be an excellent candidate for neoadjuvant therapy given large tumor size despite no definitive evidence of lymph node involvement especially with her high-grade, high proliferation index invasive ductal carcinoma which is functionally triple negative. She has now completed AC portion of chemo and is on weekly taxol. PE near complete response, some abnormal density in the area of the breast mass. She is tolerating chemo very well except for some minor issues. Ok to continue treatment today if labs are all within parameters.  # 2 Drug induced constipation, ok to use PRN stool softeners and laxatives  #3 Anemia secondary to chemotherapy, no indication to transfuse, will continue to monitor.

## 2022-11-23 ENCOUNTER — Inpatient Hospital Stay: Payer: Managed Care, Other (non HMO)

## 2022-11-23 ENCOUNTER — Other Ambulatory Visit: Payer: Self-pay

## 2022-11-23 ENCOUNTER — Encounter: Payer: Self-pay | Admitting: *Deleted

## 2022-11-23 ENCOUNTER — Inpatient Hospital Stay (HOSPITAL_BASED_OUTPATIENT_CLINIC_OR_DEPARTMENT_OTHER): Payer: Managed Care, Other (non HMO) | Admitting: Hematology and Oncology

## 2022-11-23 VITALS — BP 114/79 | HR 98 | Resp 16

## 2022-11-23 VITALS — BP 122/81 | HR 90 | Temp 97.3°F | Resp 17 | Ht 64.0 in | Wt 177.6 lb

## 2022-11-23 DIAGNOSIS — C50412 Malignant neoplasm of upper-outer quadrant of left female breast: Secondary | ICD-10-CM

## 2022-11-23 DIAGNOSIS — Z95828 Presence of other vascular implants and grafts: Secondary | ICD-10-CM

## 2022-11-23 DIAGNOSIS — Z170 Estrogen receptor positive status [ER+]: Secondary | ICD-10-CM

## 2022-11-23 DIAGNOSIS — Z5111 Encounter for antineoplastic chemotherapy: Secondary | ICD-10-CM | POA: Diagnosis not present

## 2022-11-23 LAB — CMP (CANCER CENTER ONLY)
ALT: 17 U/L (ref 0–44)
AST: 18 U/L (ref 15–41)
Albumin: 3.8 g/dL (ref 3.5–5.0)
Alkaline Phosphatase: 53 U/L (ref 38–126)
Anion gap: 6 (ref 5–15)
BUN: 5 mg/dL — ABNORMAL LOW (ref 6–20)
CO2: 26 mmol/L (ref 22–32)
Calcium: 8.8 mg/dL — ABNORMAL LOW (ref 8.9–10.3)
Chloride: 109 mmol/L (ref 98–111)
Creatinine: 0.68 mg/dL (ref 0.44–1.00)
GFR, Estimated: >60 mL/min (ref >60)
Glucose, Bld: 99 mg/dL (ref 70–99)
Potassium: 3.6 mmol/L (ref 3.5–5.1)
Sodium: 141 mmol/L (ref 135–145)
Total Bilirubin: 0.2 mg/dL — ABNORMAL LOW (ref 0.3–1.2)
Total Protein: 6.2 g/dL — ABNORMAL LOW (ref 6.5–8.1)

## 2022-11-23 LAB — CBC WITH DIFFERENTIAL (CANCER CENTER ONLY)
Abs Immature Granulocytes: 0.07 10*3/uL (ref 0.00–0.07)
Basophils Absolute: 0.1 10*3/uL (ref 0.0–0.1)
Basophils Relative: 1 %
Eosinophils Absolute: 0.2 10*3/uL (ref 0.0–0.5)
Eosinophils Relative: 4 %
HCT: 32.7 % — ABNORMAL LOW (ref 36.0–46.0)
Hemoglobin: 10.6 g/dL — ABNORMAL LOW (ref 12.0–15.0)
Immature Granulocytes: 2 %
Lymphocytes Relative: 35 %
Lymphs Abs: 1.5 10*3/uL (ref 0.7–4.0)
MCH: 27.5 pg (ref 26.0–34.0)
MCHC: 32.4 g/dL (ref 30.0–36.0)
MCV: 84.9 fL (ref 80.0–100.0)
Monocytes Absolute: 0.2 10*3/uL (ref 0.1–1.0)
Monocytes Relative: 6 %
Neutro Abs: 2.2 10*3/uL (ref 1.7–7.7)
Neutrophils Relative %: 52 %
Platelet Count: 304 10*3/uL (ref 150–400)
RBC: 3.85 MIL/uL — ABNORMAL LOW (ref 3.87–5.11)
RDW: 19.2 % — ABNORMAL HIGH (ref 11.5–15.5)
WBC Count: 4.2 10*3/uL (ref 4.0–10.5)
nRBC: 0 % (ref 0.0–0.2)

## 2022-11-23 MED ORDER — LORAZEPAM 2 MG/ML IJ SOLN
1.0000 mg | Freq: Once | INTRAMUSCULAR | Status: AC
  Administered 2022-11-23: 1 mg via INTRAVENOUS
  Filled 2022-11-23: qty 1

## 2022-11-23 MED ORDER — DIPHENHYDRAMINE HCL 50 MG/ML IJ SOLN
50.0000 mg | Freq: Once | INTRAMUSCULAR | Status: AC
  Administered 2022-11-23: 50 mg via INTRAVENOUS
  Filled 2022-11-23: qty 1

## 2022-11-23 MED ORDER — SODIUM CHLORIDE 0.9 % IV SOLN: Freq: Once | INTRAVENOUS | Status: DC | PRN

## 2022-11-23 MED ORDER — METHYLPREDNISOLONE SODIUM SUCC 125 MG IJ SOLR
60.0000 mg | INTRAMUSCULAR | Status: DC | PRN
  Administered 2022-11-23: 60 mg via INTRAVENOUS
  Filled 2022-11-23: qty 2

## 2022-11-23 MED ORDER — SODIUM CHLORIDE 0.9% FLUSH
10.0000 mL | INTRAVENOUS | Status: DC | PRN
  Administered 2022-11-23: 10 mL

## 2022-11-23 MED ORDER — SODIUM CHLORIDE 0.9% FLUSH
10.0000 mL | Freq: Once | INTRAVENOUS | Status: AC
  Administered 2022-11-23: 10 mL

## 2022-11-23 MED ORDER — SODIUM CHLORIDE 0.9 % IV SOLN
80.0000 mg/m2 | Freq: Once | INTRAVENOUS | Status: AC
  Administered 2022-11-23: 150 mg via INTRAVENOUS
  Filled 2022-11-23: qty 25

## 2022-11-23 MED ORDER — HEPARIN SOD (PORK) LOCK FLUSH 100 UNIT/ML IV SOLN
500.0000 [IU] | Freq: Once | INTRAVENOUS | Status: AC | PRN
  Administered 2022-11-23: 500 [IU]

## 2022-11-23 MED ORDER — SODIUM CHLORIDE 0.9 % IV SOLN
10.0000 mg | Freq: Once | INTRAVENOUS | Status: AC
  Administered 2022-11-23: 10 mg via INTRAVENOUS
  Filled 2022-11-23: qty 10

## 2022-11-23 MED ORDER — SODIUM CHLORIDE 0.9 % IV SOLN: Freq: Once | INTRAVENOUS | Status: AC

## 2022-11-23 MED ORDER — FAMOTIDINE IN NACL 20-0.9 MG/50ML-% IV SOLN
20.0000 mg | Freq: Once | INTRAVENOUS | Status: AC
  Administered 2022-11-23: 20 mg via INTRAVENOUS
  Filled 2022-11-23: qty 50

## 2022-11-23 NOTE — Progress Notes (Signed)
[facility] Cancer Follow up:    Nicole Caffey, MD [address] [place] [address]   DIAGNOSIS:  Cancer Staging  Malignant neoplasm of upper-outer quadrant of left female breast [facility]) Staging form: Breast, AJCC 8th Edition - Clinical stage from 06/28/2022: Stage IB (cT1c, cN0(f), cM0, G3, ER+, PR-, HER2-) - Signed by Ronny Bacon, PA-C on 06/28/2022 Stage prefix: Initial diagnosis Method of lymph node assessment: Core biopsy Histologic grading system: 3 grade system   SUMMARY OF ONCOLOGIC HISTORY: Oncology History  Malignant neoplasm of upper-outer quadrant of left female breast (HCC)  06/28/2022 Initial Diagnosis   Primary malignant neoplasm of 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  The report date is July 05, 2022. Negative genetic testing on the Multi-cancer panel.  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  BLM c.3136G>A (p.Gly1046Ser), EGFR c.61G>A (p.Ala21Thr) and PDGFRA c.50G>T (p.Gly17Val) VUS identified.  The report date is July 11, 2022.  The STAT Breast cancer panel offered by [facility] includes sequencing and rearrangement analysis for the following 9 genes:  ATM, BRCA1, BRCA2, CDH1, CHEK2, PALB2, PTEN, STK11 and TP53.   The Multi-Cancer + RNA Panel offered by [facility] includes sequencing and/or deletion/duplication analysis of the following 70 genes:  AIP*, ALK, APC*, ATM*, AXIN2*, BAP1*, BARD1*, BLM*, BMPR1A*, BRCA1*, BRCA2*, BRIP1*, CDC73*, CDH1*, CDK4, CDKN1B*, CDKN2A, CHEK2*, CTNNA1*, DICER1*, EPCAM (del/dup only), EGFR, FH*, FLCN*, GREM1  (promoter dup only), HOXB13, KIT, LZTR1, MAX*, MBD4, MEN1*, MET, MITF, MLH1*, MSH2*, MSH3*, MSH6*, MUTYH*, NF1*, NF2*, NTHL1*, PALB2*, PDGFRA, PMS2*, POLD1*, POLE*, POT1*, PRKAR1A*, PTCH1*, PTEN*, RAD51C*, RAD51D*, RB1*, RET, SDHA* (sequencing only), SDHAF2*, SDHB*, SDHC*, SDHD*, SMAD4*, SMARCA4*, SMARCB1*, SMARCE1*, STK11*, SUFU*, TMEM127*, TP53*, TSC1*, TSC2*, VHL*. RNA analysis is performed for * genes.    07/20/2022 -  Chemotherapy   Patient is on Treatment Plan : BREAST ADJUVANT DOSE DENSE AC q14d / PACLitaxel q7d       CURRENT THERAPY: Taxol week 11  INTERVAL HISTORY:  Nicole Maddox 42 y.o. female returns for f/u prior to treatment.  She is here for follow-up before her weekly Taxol.  Since her last visit here, she continues to feel the lump in the left breast.  She did not have any abdominal cramps during her last infusion.  No neuropathy reported today.  She otherwise feels well.  She had her MRI done last evening.  No nausea or vomiting or diarrhea.  Rest of the pertinent 10 point ROS reviewed and negative  Patient Active Problem List   Diagnosis Date Noted   Port-A-Cath in place 07/20/2022   Genetic testing 07/07/2022   Family history of stomach cancer 06/29/2022   Malignant neoplasm of upper-outer quadrant of left female breast (HCC) 06/27/2022   Seborrheic dermatitis of scalp 02/25/2018    is allergic to adriamycin [doxorubicin].  MEDICAL HISTORY: Past Medical History:  Diagnosis Date   Breast cancer (HCC) 06/23/2022   Family history of stomach cancer    GERD (gastroesophageal reflux disease)    Hyperthyroidism    Thyroid disease     SURGICAL HISTORY: Past Surgical History:  Procedure Laterality Date  BREAST BIOPSY Left 06/23/2022   [place] LT BREAST BX W LOC DEV 1ST LESION IMG BX SPEC US GUIDE 06/23/2022 [facility] MAMMOGRAPHY   PORTACATH PLACEMENT Right 07/19/2022   Procedure: INSERTION PORT-A-CATH;  Surgeon: Griselda Miner, MD;  Location: [facility];   Service: General;  Laterality: Right;  60 MIN ROOM 8   WISDOM TOOTH EXTRACTION      SOCIAL HISTORY: Social History   Socioeconomic History   Marital status: Single    Spouse name: Not on file   Number of children: Not on file   Years of education: Not on file   Highest education level: Not on file  Occupational History   Not on file  Tobacco Use   Smoking status: Never   Smokeless tobacco: Never  Vaping Use   Vaping Use: Never used  Substance and Sexual Activity   Alcohol use: Yes    Comment: socially   Drug use: No   Sexual activity: Yes    Partners: Male    Birth control/protection: None    Comment: intercourse age 76, more than 6 sexual parters,   Other Topics Concern   Not on file  Social History Narrative   Not on file   Social Determinants of Health   Financial Resource Strain: Low Risk  (07/04/2022)   Overall Financial Resource Strain (CARDIA)    Difficulty of Paying Living Expenses: Not very hard  Food Insecurity: No Food Insecurity (06/28/2022)   Hunger Vital Sign    Worried About Running Out of Food in the Last Year: Never true    Ran Out of Food in the Last Year: Never true  Transportation Needs: No Transportation Needs (06/28/2022)   PRAPARE - Administrator, Civil Service (Medical): No    Lack of Transportation (Non-Medical): No  Physical Activity: Not on file  Stress: Not on file  Social Connections: Not on file  Intimate Partner Violence: Not At Risk (06/28/2022)   Humiliation, Afraid, Rape, and Kick questionnaire    Fear of Current or Ex-Partner: No    Emotionally Abused: No    Physically Abused: No    Sexually Abused: No    FAMILY HISTORY: Family History  Problem Relation Age of Onset   Heart attack Father 30   Stomach cancer Maternal Aunt        dx > 50   Throat cancer Maternal Uncle        dx 55s   Stomach cancer Maternal Uncle        dx > 50   Heart Problems Maternal Grandmother    Breast cancer Cousin        mother's  maternal first cousin    Review of Systems  Constitutional:  Positive for fatigue. Negative for appetite change, chills, fever and unexpected weight change.  HENT:   Negative for hearing loss, lump/mass and trouble swallowing.   Eyes:  Negative for eye problems and icterus.  Respiratory:  Negative for chest tightness, cough and shortness of breath.   Cardiovascular:  Negative for chest pain, leg swelling and palpitations.  Gastrointestinal:  Negative for abdominal distention, abdominal pain, constipation, diarrhea, nausea and vomiting.  Endocrine: Positive for hot flashes.  Genitourinary:  Negative for difficulty urinating.   Musculoskeletal:  Negative for arthralgias.  Skin:  Negative for itching and rash.  Neurological:  Negative for dizziness, extremity weakness, headaches and numbness.  Hematological:  Negative for adenopathy. Does not bruise/bleed easily.  Psychiatric/Behavioral:  Positive for sleep disturbance. Negative  for depression. The patient is not nervous/anxious.       PHYSICAL EXAMINATION  Vitals:   11/23/22 0920  BP: 122/81  Pulse: 90  Resp: 17  Temp: (!) 97.3 F (36.3 C)  SpO2: 100%    Physical Exam Constitutional:      General: She is not in acute distress.    Appearance: Normal appearance. She is not toxic-appearing.  HENT:     Head: Normocephalic and atraumatic.     Mouth/Throat:     Mouth: Mucous membranes are moist.     Pharynx: Oropharynx is clear. No oropharyngeal exudate or posterior oropharyngeal erythema.  Eyes:     General: No scleral icterus. Cardiovascular:     Rate and Rhythm: Normal rate and regular rhythm.     Pulses: Normal pulses.     Heart sounds: Normal heart sounds.  Pulmonary:     Effort: Pulmonary effort is normal.     Breath sounds: Normal breath sounds.  Chest:  Breasts:    Left: Mass present. No swelling.       Comments: There is a palpable mass in the left upper outer quadrant at the very edge of the breast or lower  axilla, easily mobile.  She had complete clinical response to chemotherapy so far with regards to the previous breast mass.  I could still feel this mass today. Abdominal:     General: Abdomen is flat. Bowel sounds are normal. There is no distension.     Palpations: Abdomen is soft.     Tenderness: There is no abdominal tenderness.  Musculoskeletal:        General: No swelling.     Cervical back: Neck supple.  Lymphadenopathy:     Cervical: No cervical adenopathy.  Skin:    General: Skin is warm and dry.     Findings: No rash.  Neurological:     General: No focal deficit present.     Mental Status: She is alert.  Psychiatric:        Mood and Affect: Mood normal.        Behavior: Behavior normal.    LABORATORY DATA:  CBC    Component Value Date/Time   WBC 4.2 11/23/2022 0857   WBC 15.4 (H) 07/22/2022 1535   RBC 3.85 (L) 11/23/2022 0857   HGB 10.6 (L) 11/23/2022 0857   HCT 32.7 (L) 11/23/2022 0857   PLT 304 11/23/2022 0857   MCV 84.9 11/23/2022 0857   MCH 27.5 11/23/2022 0857   MCHC 32.4 11/23/2022 0857   RDW 19.2 (H) 11/23/2022 0857   LYMPHSABS 1.5 11/23/2022 0857   MONOABS 0.2 11/23/2022 0857   EOSABS 0.2 11/23/2022 0857   BASOSABS 0.1 11/23/2022 0857    CMP     Component Value Date/Time   NA 141 11/23/2022 0857   K 3.6 11/23/2022 0857   CL 109 11/23/2022 0857   CO2 26 11/23/2022 0857   GLUCOSE 99 11/23/2022 0857   BUN 5 (L) 11/23/2022 0857   CREATININE 0.68 11/23/2022 0857   CALCIUM 8.8 (L) 11/23/2022 0857   PROT 6.2 (L) 11/23/2022 0857   ALBUMIN 3.8 11/23/2022 0857   AST 18 11/23/2022 0857   ALT 17 11/23/2022 0857   ALKPHOS 53 11/23/2022 0857   BILITOT 0.2 (L) 11/23/2022 0857   GFRNONAA >60 11/23/2022 0857       PENDING LABS:   RADIOGRAPHIC STUDIES:  No results found.   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  She is now on weekly paclitaxel for chemotherapy. PE, complete response, no palpable mass.  She is doing really well so far.  She has some intermittent abdominal cramps during infusion which are manageable while on Xanax. Today she however complains of a new breast lump in the left breast.  This is only felt in sitting position.  This is at the very outer edge of the breast or in the lower axilla, palpable mobile mass.  This mass is still palpable hence she will undergo mammogram and ultrasound.  She also had an MRI last evening, pending read.  Other than this, the primary mass in the left breast that she had has completely resolved. We will proceed with chemotherapy today if all labs are within parameters.  #Anemia secondary to chemotherapy, no indication to transfuse, will continue to monitor.   All questions were answered. The patient knows to call the clinic with any problems, questions or concerns. We can certainly see the patient much sooner if necessary.  Total encounter time:30 minutes*in face-to-face visit time, chart review, lab review, care coordination, order entry, and documentation of the encounter time.  *Total Encounter Time as defined by the Centers for Medicare and Medicaid Services includes, in addition to the face-to-face time of a patient visit (documented in the note above) non-face-to-face time: obtaining and reviewing outside history, ordering and reviewing medications, tests or procedures, care coordination (communications with other health care professionals or caregivers) and documentation in the medical record.

## 2022-11-23 NOTE — Patient Instructions (Signed)
[facility]  Discharge Instructions: Thank you for choosing [facility] to provide your oncology and hematology care.   If you have a lab appointment with the Cancer Center, please go directly to the Cancer Center and check in at the registration area.   Wear comfortable clothing and clothing appropriate for easy access to any Portacath or PICC line.   We strive to give you quality time with your provider. You may need to reschedule your appointment if you arrive late (15 or more minutes).  Arriving late affects you and other patients whose appointments are after yours.  Also, if you miss three or more appointments without notifying the office, you may be dismissed from the clinic at the provider's discretion.      For prescription refill requests, have your pharmacy contact our office and allow 72 hours for refills to be completed.    Today you received the following chemotherapy and/or immunotherapy agents Paclitaxel      To help prevent nausea and vomiting after your treatment, we encourage you to take your nausea medication as directed.  BELOW ARE SYMPTOMS THAT SHOULD BE REPORTED IMMEDIATELY: *FEVER GREATER THAN 100.4 F (38 C) OR HIGHER *CHILLS OR SWEATING *NAUSEA AND VOMITING THAT IS NOT CONTROLLED WITH YOUR NAUSEA MEDICATION *UNUSUAL SHORTNESS OF BREATH *UNUSUAL BRUISING OR BLEEDING *URINARY PROBLEMS (pain or burning when urinating, or frequent urination) *BOWEL PROBLEMS (unusual diarrhea, constipation, pain near the anus) TENDERNESS IN MOUTH AND THROAT WITH OR WITHOUT PRESENCE OF ULCERS (sore throat, sores in mouth, or a toothache) UNUSUAL RASH, SWELLING OR PAIN  UNUSUAL VAGINAL DISCHARGE OR ITCHING   Items with * indicate a potential emergency and should be followed up as soon as possible or go to the Emergency Department if any problems should occur.  Please show the CHEMOTHERAPY ALERT CARD or IMMUNOTHERAPY ALERT CARD at  check-in to the Emergency Department and triage nurse.  Should you have questions after your visit or need to cancel or reschedule your appointment, please contact [facility]  Dept: [phone]  and follow the prompts.  Office hours are 8:00 a.m. to 4:30 p.m. Monday - Friday. Please note that voicemails left after 4:00 p.m. may not be returned until the following business day.  We are closed weekends and major holidays. You have access to a nurse at all times for urgent questions. Please call the main number to the clinic Dept: [phone] and follow the prompts.   For any non-urgent questions, you may also contact your provider using MyChart. We now offer e-Visits for anyone 18 and older to request care online for non-urgent symptoms. For details visit [email or web address].   Also download the MyChart app! Go to the app store, search "MyChart", open the app, select [facility], and log in with your MyChart username and password.   

## 2022-11-23 NOTE — Progress Notes (Signed)
Patient c/o clamminess, abdominal tightness, and nausea with approximately 30 mL of docetaxel remaining in infusion.  VSS (see Flowsheets).  Infusion stopped, IVF hung to gravity.  Daphane Shepherd, PA, at chairside.  Per Jae Dire, OK to complete infusion if patient agreeable.  Patient able to complete infusion, reported improvement in symptoms.  VSS at discharge.  Ambulated to lobby for ride home with friend.  Solu-medrol given immediately prior to docetaxel infusion; lorazepam given 20 min after start of docetaxel infusion.  See eMAR for administration details.

## 2022-11-28 ENCOUNTER — Ambulatory Visit
Admission: RE | Admit: 2022-11-28 | Discharge: 2022-11-28 | Disposition: A | Discharge status: Home / Self Care | Payer: Managed Care, Other (non HMO) | Source: Ambulatory Visit | Attending: General Surgery | Admitting: General Surgery

## 2022-11-28 DIAGNOSIS — N6320 Unspecified lump in the left breast, unspecified quadrant: Secondary | ICD-10-CM

## 2022-11-28 DIAGNOSIS — C50412 Malignant neoplasm of upper-outer quadrant of left female breast: Secondary | ICD-10-CM

## 2022-11-29 ENCOUNTER — Encounter: Payer: Self-pay | Admitting: *Deleted

## 2022-11-29 MED FILL — Dexamethasone Sodium Phosphate Inj 100 MG/10ML: INTRAMUSCULAR | Qty: 1 | Status: AC

## 2022-11-30 ENCOUNTER — Inpatient Hospital Stay (HOSPITAL_BASED_OUTPATIENT_CLINIC_OR_DEPARTMENT_OTHER): Payer: Managed Care, Other (non HMO) | Admitting: Hematology and Oncology

## 2022-11-30 ENCOUNTER — Other Ambulatory Visit: Payer: Self-pay

## 2022-11-30 ENCOUNTER — Inpatient Hospital Stay: Payer: Managed Care, Other (non HMO)

## 2022-11-30 ENCOUNTER — Other Ambulatory Visit: Payer: Self-pay | Admitting: *Deleted

## 2022-11-30 VITALS — BP 116/75 | HR 90 | Resp 16

## 2022-11-30 DIAGNOSIS — Z170 Estrogen receptor positive status [ER+]: Secondary | ICD-10-CM

## 2022-11-30 DIAGNOSIS — C50412 Malignant neoplasm of upper-outer quadrant of left female breast: Secondary | ICD-10-CM | POA: Diagnosis not present

## 2022-11-30 DIAGNOSIS — L219 Seborrheic dermatitis, unspecified: Secondary | ICD-10-CM

## 2022-11-30 DIAGNOSIS — Z95828 Presence of other vascular implants and grafts: Secondary | ICD-10-CM

## 2022-11-30 DIAGNOSIS — Z1379 Encounter for other screening for genetic and chromosomal anomalies: Secondary | ICD-10-CM

## 2022-11-30 DIAGNOSIS — Z800 Family history of malignant neoplasm of digestive organs: Secondary | ICD-10-CM

## 2022-11-30 DIAGNOSIS — Z5111 Encounter for antineoplastic chemotherapy: Secondary | ICD-10-CM | POA: Diagnosis not present

## 2022-11-30 LAB — CBC WITH DIFFERENTIAL (CANCER CENTER ONLY)
Abs Immature Granulocytes: 0.07 10*3/uL (ref 0.00–0.07)
Basophils Absolute: 0.1 10*3/uL (ref 0.0–0.1)
Basophils Relative: 1 %
Eosinophils Absolute: 0.2 10*3/uL (ref 0.0–0.5)
Eosinophils Relative: 3 %
HCT: 33 % — ABNORMAL LOW (ref 36.0–46.0)
Hemoglobin: 10.8 g/dL — ABNORMAL LOW (ref 12.0–15.0)
Immature Granulocytes: 1 %
Lymphocytes Relative: 32 %
Lymphs Abs: 1.7 10*3/uL (ref 0.7–4.0)
MCH: 27.6 pg (ref 26.0–34.0)
MCHC: 32.7 g/dL (ref 30.0–36.0)
MCV: 84.4 fL (ref 80.0–100.0)
Monocytes Absolute: 0.2 10*3/uL (ref 0.1–1.0)
Monocytes Relative: 4 %
Neutro Abs: 3 10*3/uL (ref 1.7–7.7)
Neutrophils Relative %: 59 %
Platelet Count: 294 10*3/uL (ref 150–400)
RBC: 3.91 MIL/uL (ref 3.87–5.11)
RDW: 19 % — ABNORMAL HIGH (ref 11.5–15.5)
WBC Count: 5.1 10*3/uL (ref 4.0–10.5)
nRBC: 0 % (ref 0.0–0.2)

## 2022-11-30 LAB — CMP (CANCER CENTER ONLY)
ALT: 18 U/L (ref 0–44)
AST: 17 U/L (ref 15–41)
Albumin: 3.8 g/dL (ref 3.5–5.0)
Alkaline Phosphatase: 62 U/L (ref 38–126)
Anion gap: 9 (ref 5–15)
BUN: 9 mg/dL (ref 6–20)
CO2: 26 mmol/L (ref 22–32)
Calcium: 8.9 mg/dL (ref 8.9–10.3)
Chloride: 106 mmol/L (ref 98–111)
Creatinine: 0.72 mg/dL (ref 0.44–1.00)
GFR, Estimated: >60 mL/min (ref >60)
Glucose, Bld: 114 mg/dL — ABNORMAL HIGH (ref 70–99)
Potassium: 3.5 mmol/L (ref 3.5–5.1)
Sodium: 141 mmol/L (ref 135–145)
Total Bilirubin: 0.2 mg/dL — ABNORMAL LOW (ref 0.3–1.2)
Total Protein: 6.6 g/dL (ref 6.5–8.1)

## 2022-11-30 MED ORDER — SODIUM CHLORIDE 0.9 % IV SOLN: Freq: Once | INTRAVENOUS | Status: AC

## 2022-11-30 MED ORDER — SODIUM CHLORIDE 0.9% FLUSH
10.0000 mL | Freq: Once | INTRAVENOUS | Status: AC
  Administered 2022-11-30: 10 mL

## 2022-11-30 MED ORDER — FAMOTIDINE IN NACL 20-0.9 MG/50ML-% IV SOLN
20.0000 mg | Freq: Once | INTRAVENOUS | Status: AC
  Administered 2022-11-30: 20 mg via INTRAVENOUS
  Filled 2022-11-30: qty 50

## 2022-11-30 MED ORDER — METHYLPREDNISOLONE SODIUM SUCC 125 MG IJ SOLR
60.0000 mg | INTRAMUSCULAR | Status: DC | PRN
  Administered 2022-11-30: 60 mg via INTRAVENOUS
  Filled 2022-11-30: qty 2

## 2022-11-30 MED ORDER — SODIUM CHLORIDE 0.9% FLUSH
10.0000 mL | INTRAVENOUS | Status: DC | PRN
  Administered 2022-11-30: 10 mL

## 2022-11-30 MED ORDER — LORAZEPAM 2 MG/ML IJ SOLN
1.0000 mg | Freq: Once | INTRAMUSCULAR | Status: AC
  Administered 2022-11-30: 1 mg via INTRAVENOUS
  Filled 2022-11-30: qty 1

## 2022-11-30 MED ORDER — HEPARIN SOD (PORK) LOCK FLUSH 100 UNIT/ML IV SOLN
500.0000 [IU] | Freq: Once | INTRAVENOUS | Status: AC | PRN
  Administered 2022-11-30: 500 [IU]

## 2022-11-30 MED ORDER — DIPHENHYDRAMINE HCL 50 MG/ML IJ SOLN
50.0000 mg | Freq: Once | INTRAMUSCULAR | Status: AC
  Administered 2022-11-30: 50 mg via INTRAVENOUS
  Filled 2022-11-30: qty 1

## 2022-11-30 MED ORDER — SODIUM CHLORIDE 0.9 % IV SOLN
10.0000 mg | Freq: Once | INTRAVENOUS | Status: AC
  Administered 2022-11-30: 10 mg via INTRAVENOUS
  Filled 2022-11-30: qty 10

## 2022-11-30 MED ORDER — SODIUM CHLORIDE 0.9 % IV SOLN
80.0000 mg/m2 | Freq: Once | INTRAVENOUS | Status: AC
  Administered 2022-11-30: 150 mg via INTRAVENOUS
  Filled 2022-11-30: qty 25

## 2022-11-30 NOTE — Progress Notes (Signed)
[facility] Cancer Follow up:    Nicole Caffey, MD [address] [place] [address]   DIAGNOSIS:  Cancer Staging  Malignant neoplasm of upper-outer quadrant of left female breast [facility]) Staging form: Breast, AJCC 8th Edition - Clinical stage from 06/28/2022: Stage IB (cT1c, cN0(f), cM0, G3, ER+, PR-, HER2-) - Signed by Ronny Bacon, PA-C on 06/28/2022 Stage prefix: Initial diagnosis Method of lymph node assessment: Core biopsy Histologic grading system: 3 grade system   SUMMARY OF ONCOLOGIC HISTORY: Oncology History  Malignant neoplasm of upper-outer quadrant of left female breast (HCC)  06/28/2022 Initial Diagnosis   Primary malignant neoplasm of 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  The report date is July 05, 2022. Negative genetic testing on the Multi-cancer panel.  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  BLM c.3136G>A (p.Gly1046Ser), EGFR c.61G>A (p.Ala21Thr) and PDGFRA c.50G>T (p.Gly17Val) VUS identified.  The report date is July 11, 2022.  The STAT Breast cancer panel offered by [facility] includes sequencing and rearrangement analysis for the following 9 genes:  ATM, BRCA1, BRCA2, CDH1, CHEK2, PALB2, PTEN, STK11 and TP53.   The Multi-Cancer + RNA Panel offered by [facility] includes sequencing and/or deletion/duplication analysis of the following 70 genes:  AIP*, ALK, APC*, ATM*, AXIN2*, BAP1*, BARD1*, BLM*, BMPR1A*, BRCA1*, BRCA2*, BRIP1*, CDC73*, CDH1*, CDK4, CDKN1B*, CDKN2A, CHEK2*, CTNNA1*, DICER1*, EPCAM (del/dup only), EGFR, FH*, FLCN*, GREM1  (promoter dup only), HOXB13, KIT, LZTR1, MAX*, MBD4, MEN1*, MET, MITF, MLH1*, MSH2*, MSH3*, MSH6*, MUTYH*, NF1*, NF2*, NTHL1*, PALB2*, PDGFRA, PMS2*, POLD1*, POLE*, POT1*, PRKAR1A*, PTCH1*, PTEN*, RAD51C*, RAD51D*, RB1*, RET, SDHA* (sequencing only), SDHAF2*, SDHB*, SDHC*, SDHD*, SMAD4*, SMARCA4*, SMARCB1*, SMARCE1*, STK11*, SUFU*, TMEM127*, TP53*, TSC1*, TSC2*, VHL*. RNA analysis is performed for * genes.    07/20/2022 -  Chemotherapy   Patient is on Treatment Plan : BREAST ADJUVANT DOSE DENSE AC q14d / PACLitaxel q7d       CURRENT THERAPY: Taxol week 12  INTERVAL HISTORY:  Nicole Maddox 42 y.o. female returns for f/u prior to treatment.  She is here for follow-up before her weekly Taxol.   Since her last visit she has been doing really well.  She complains of some vague symptoms like a fullness in her chest which last for a few seconds and goes away with deep breaths.  She denies any neuropathy.  She is since her last visit had an MRI as well as a mammogram and ultrasound.  There appears to be a residual mass at the site of the primary diagnosis based on discussion in tumor board.  She is going to schedule a follow-up with Dr. Carolynne Edouard. No nausea or vomiting or diarrhea.  No fevers or chills. Rest of the pertinent 10 point ROS reviewed and negative  Patient Active Problem List   Diagnosis Date Noted   Port-A-Cath in place 07/20/2022   Genetic testing 07/07/2022   Family history of stomach cancer 06/29/2022   Malignant neoplasm of upper-outer quadrant of left female breast (HCC) 06/27/2022   Seborrheic dermatitis of scalp 02/25/2018    is allergic to adriamycin [doxorubicin].  MEDICAL HISTORY:  Past Medical History:  Diagnosis Date   Breast cancer (HCC) 06/23/2022   Family history of stomach cancer    GERD (gastroesophageal reflux disease)    Hyperthyroidism    Thyroid disease     SURGICAL HISTORY: Past Surgical History:  Procedure Laterality Date   BREAST BIOPSY Left 06/23/2022    [place] LT BREAST BX W LOC DEV 1ST LESION IMG BX SPEC US GUIDE 06/23/2022 [facility] MAMMOGRAPHY   PORTACATH PLACEMENT Right 07/19/2022   Procedure: INSERTION PORT-A-CATH;  Surgeon: Griselda Miner, MD;  Location: [facility];  Service: General;  Laterality: Right;  60 MIN ROOM 8   WISDOM TOOTH EXTRACTION      SOCIAL HISTORY: Social History   Socioeconomic History   Marital status: Single    Spouse name: Not on file   Number of children: Not on file   Years of education: Not on file   Highest education level: Not on file  Occupational History   Not on file  Tobacco Use   Smoking status: Never   Smokeless tobacco: Never  Vaping Use   Vaping Use: Never used  Substance and Sexual Activity   Alcohol use: Yes    Comment: socially   Drug use: No   Sexual activity: Yes    Partners: Male    Birth control/protection: None    Comment: intercourse age 66, more than 6 sexual parters,   Other Topics Concern   Not on file  Social History Narrative   Not on file   Social Determinants of Health   Financial Resource Strain: Low Risk  (07/04/2022)   Overall Financial Resource Strain (CARDIA)    Difficulty of Paying Living Expenses: Not very hard  Food Insecurity: No Food Insecurity (06/28/2022)   Hunger Vital Sign    Worried About Running Out of Food in the Last Year: Never true    Ran Out of Food in the Last Year: Never true  Transportation Needs: No Transportation Needs (06/28/2022)   PRAPARE - Administrator, Civil Service (Medical): No    Lack of Transportation (Non-Medical): No  Physical Activity: Not on file  Stress: Not on file  Social Connections: Not on file  Intimate Partner Violence: Not At Risk (06/28/2022)   Humiliation, Afraid, Rape, and Kick questionnaire    Fear of Current or Ex-Partner: No    Emotionally Abused: No    Physically Abused: No    Sexually Abused: No    FAMILY HISTORY: Family History  Problem Relation Age of Onset   Heart attack  Father 71   Stomach cancer Maternal Aunt        dx > 50   Throat cancer Maternal Uncle        dx 84s   Stomach cancer Maternal Uncle        dx > 50   Heart Problems Maternal Grandmother    Breast cancer Cousin        mother's maternal first cousin    Review of Systems  Constitutional:  Positive for fatigue. Negative for appetite change, chills, fever and unexpected weight change.  HENT:   Negative for hearing loss, lump/mass and trouble swallowing.   Eyes:  Negative for eye problems and icterus.  Respiratory:  Negative for chest tightness, cough and shortness of breath.   Cardiovascular:  Negative for chest pain, leg swelling and palpitations.  Gastrointestinal:  Negative for abdominal distention, abdominal pain, constipation, diarrhea, nausea and vomiting.  Endocrine: Positive for hot flashes.  Genitourinary:  Negative for difficulty urinating.   Musculoskeletal:  Negative for arthralgias.  Skin:  Negative for itching and rash.  Neurological:  Negative for dizziness, extremity weakness, headaches and numbness.  Hematological:  Negative for adenopathy. Does not bruise/bleed easily.  Psychiatric/Behavioral:  Positive for sleep disturbance. Negative for depression. The patient is not nervous/anxious.       PHYSICAL EXAMINATION  Vitals:   11/30/22 0830  BP: 119/71  Pulse: 81  Resp: 16  Temp: 98.7 F (37.1 C)  SpO2: 100%    Physical Exam Constitutional:      General: She is not in acute distress.    Appearance: Normal appearance. She is not toxic-appearing.  HENT:     Head: Normocephalic and atraumatic.     Mouth/Throat:     Mouth: Mucous membranes are moist.     Pharynx: Oropharynx is clear. No oropharyngeal exudate or posterior oropharyngeal erythema.  Eyes:     General: No scleral icterus. Cardiovascular:     Rate and Rhythm: Normal rate and regular rhythm.     Pulses: Normal pulses.     Heart sounds: Normal heart sounds.  Pulmonary:     Effort: Pulmonary  effort is normal.     Breath sounds: Normal breath sounds.  Chest:  Breasts:    Left: Mass present. No swelling.       Comments: There is a palpable mass in the left upper outer quadrant at the very edge of the breast which also appears smaller compared to my last visit here.  There is no palpable mass in the breast. Abdominal:     General: Abdomen is flat. Bowel sounds are normal. There is no distension.     Palpations: Abdomen is soft.     Tenderness: There is no abdominal tenderness.  Musculoskeletal:        General: No swelling.     Cervical back: Neck supple.  Lymphadenopathy:     Cervical: No cervical adenopathy.  Skin:    General: Skin is warm and dry.     Findings: No rash.  Neurological:     General: No focal deficit present.     Mental Status: She is alert.  Psychiatric:        Mood and Affect: Mood normal.        Behavior: Behavior normal.    LABORATORY DATA:  CBC    Component Value Date/Time   WBC 5.1 11/30/2022 0758   WBC 15.4 (H) 07/22/2022 1535   RBC 3.91 11/30/2022 0758   HGB 10.8 (L) 11/30/2022 0758   HCT 33.0 (L) 11/30/2022 0758   PLT 294 11/30/2022 0758   MCV 84.4 11/30/2022 0758   MCH 27.6 11/30/2022 0758   MCHC 32.7 11/30/2022 0758   RDW 19.0 (H) 11/30/2022 0758   LYMPHSABS 1.7 11/30/2022 0758   MONOABS 0.2 11/30/2022 0758   EOSABS 0.2 11/30/2022 0758   BASOSABS 0.1 11/30/2022 0758    CMP     Component Value Date/Time   NA 141 11/23/2022 0857   K 3.6 11/23/2022 0857   CL 109 11/23/2022 0857   CO2 26 11/23/2022 0857   GLUCOSE 99 11/23/2022 0857   BUN 5 (L) 11/23/2022 0857   CREATININE 0.68 11/23/2022 0857   CALCIUM 8.8 (L) 11/23/2022 0857   PROT 6.2 (L) 11/23/2022 0857   ALBUMIN 3.8 11/23/2022 0857   AST 18 11/23/2022 0857   ALT 17 11/23/2022 0857   ALKPHOS 53 11/23/2022 0857   BILITOT 0.2 (L) 11/23/2022 0857  GFRNONAA >60 11/23/2022 0857       PENDING LABS:   RADIOGRAPHIC STUDIES:  MM 3D DIAGNOSTIC MAMMOGRAM UNILATERAL  LEFT BREAST  Result Date: 11/28/2022 CLINICAL DATA:  Patient was diagnosed with LEFT breast cancer in January of 2024. Patient is undergoing neoadjuvant treatment and has had complete clinical response to treatment. Patient now palpates a possible new mass in the UPPER-OUTER QUADRANT of the LEFT breast. EXAM: DIGITAL DIAGNOSTIC UNILATERAL LEFT MAMMOGRAM WITH TOMOSYNTHESIS; ULTRASOUND LEFT BREAST LIMITED TECHNIQUE: Left digital diagnostic mammography and breast tomosynthesis was performed.; Targeted ultrasound examination of the left breast was performed. COMPARISON:  MRI on 11/22/2022, and multiple prior studies ACR Breast Density Category c: The breasts are heterogeneously dense, which may obscure small masses. FINDINGS: Within the UPPER-OUTER QUADRANT of the LEFT breast, there is a coil shaped clip within in indistinct mass. A BB has been placed over the area of patient's concern and corresponds to the same location. The coil shaped clip is along the posterior aspect of the mass. A barbell shaped clip is identified in the UPPER-OUTER QUADRANT of the LEFT breast at the site of previous benign concordant MR guided biopsy showing fibrocystic changes with usual duct hyperplasia, apocrine metaplasia, and calcifications. LEFT breast is otherwise negative. On physical exam, I palpate a discrete mobile mass in the UPPER-OUTER QUADRANT of the LEFT breast near the LOWER axilla, best palpated when the patient is sitting. Targeted ultrasound is performed, showing a partially anechoic partially hypoechoic oval mass with angular margins in the 1 o'clock location of the LEFT breast 10 centimeters from the nipple. Mass measures 1.2 x 0.9 x 1.2 centimeters. An echogenic focus along the posterior MEDIAL aspect of the mass likely represents the tissue marker clip placed at the time of biopsy in January. There is no internal blood flow on Doppler evaluation. IMPRESSION: Palpable mass represents a 1.2 centimeter partially anechoic  mass, likely representing the site of original malignancy. There is no internal blood flow within the lesion, possibly indicating a necrotic tumor bed. When compared with the most recent MRI, it is likely that the area of concern is excluded on the MRI, given its posterosuperior location. RECOMMENDATION: Recommend continued treatment plan for known LEFT breast malignancy. I have discussed the findings and recommendations with the patient. If applicable, a reminder letter will be sent to the patient regarding the next appointment. BI-RADS CATEGORY  6: Known biopsy-proven malignancy. Electronically Signed   By: Norva Pavlov M.D.   On: 11/28/2022 15:22  [place] LIMITED ULTRASOUND INCLUDING AXILLA LEFT BREAST   Result Date: 11/28/2022 CLINICAL DATA:  Patient was diagnosed with LEFT breast cancer in January of 2024. Patient is undergoing neoadjuvant treatment and has had complete clinical response to treatment. Patient now palpates a possible new mass in the UPPER-OUTER QUADRANT of the LEFT breast. EXAM: DIGITAL DIAGNOSTIC UNILATERAL LEFT MAMMOGRAM WITH TOMOSYNTHESIS; ULTRASOUND LEFT BREAST LIMITED TECHNIQUE: Left digital diagnostic mammography and breast tomosynthesis was performed.; Targeted ultrasound examination of the left breast was performed. COMPARISON:  MRI on 11/22/2022, and multiple prior studies ACR Breast Density Category c: The breasts are heterogeneously dense, which may obscure small masses. FINDINGS: Within the UPPER-OUTER QUADRANT of the LEFT breast, there is a coil shaped clip within in indistinct mass. A BB has been placed over the area of patient's concern and corresponds to the same location. The coil shaped clip is along the posterior aspect of the mass. A barbell shaped clip is identified in the UPPER-OUTER QUADRANT of the LEFT breast at the  site of previous benign concordant MR guided biopsy showing fibrocystic changes with usual duct hyperplasia, apocrine metaplasia, and calcifications. LEFT  breast is otherwise negative. On physical exam, I palpate a discrete mobile mass in the UPPER-OUTER QUADRANT of the LEFT breast near the LOWER axilla, best palpated when the patient is sitting. Targeted ultrasound is performed, showing a partially anechoic partially hypoechoic oval mass with angular margins in the 1 o'clock location of the LEFT breast 10 centimeters from the nipple. Mass measures 1.2 x 0.9 x 1.2 centimeters. An echogenic focus along the posterior MEDIAL aspect of the mass likely represents the tissue marker clip placed at the time of biopsy in January. There is no internal blood flow on Doppler evaluation. IMPRESSION: Palpable mass represents a 1.2 centimeter partially anechoic mass, likely representing the site of original malignancy. There is no internal blood flow within the lesion, possibly indicating a necrotic tumor bed. When compared with the most recent MRI, it is likely that the area of concern is excluded on the MRI, given its posterosuperior location. RECOMMENDATION: Recommend continued treatment plan for known LEFT breast malignancy. I have discussed the findings and recommendations with the patient. If applicable, a reminder letter will be sent to the patient regarding the next appointment. BI-RADS CATEGORY  6: Known biopsy-proven malignancy. Electronically Signed   By: Norva Pavlov M.D.   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  She is here for her week 12 of paclitaxel, tolerated it well overall.  No neuropathy reported.  Since her last visit here she had an MRI which showed no suspicious abnormality within the upper  outer left breast in the region of patient concern. Mammogram however commented on a palpable mass representing a 1.2 cm partially anechoic mass likely representing the site of original malignancy.  This was thought to be a necrotic tumor bed.  Ultrasound confirmed this.  No additional biopsy was recommended since this was thought to be either at the original site of malignancy.  She will proceed with weekly Taxol today, follow up with Dr. Carolynne Edouard for surgery and then return to clinic about 2 to 4 weeks after surgery to review adjuvant recommendations.  #Anemia secondary to chemotherapy, no indication to transfuse, will continue to monitor.   All questions were answered. The patient knows to call the clinic with any problems, questions or concerns. We can certainly see the patient much sooner if necessary.  Total encounter time:30 minutes*in face-to-face visit time, chart review, lab review, care coordination, order entry, and documentation of the encounter time.  *Total Encounter Time as defined by the Centers for Medicare and Medicaid Services includes, in addition to the face-to-face time of a patient visit (documented in the note above) non-face-to-face time: obtaining and reviewing outside history, ordering and reviewing medications, tests or procedures, care coordination (communications with other health care professionals or caregivers) and documentation in the medical record.

## 2022-11-30 NOTE — Progress Notes (Signed)
Patient requested ativan as part of premedications prior to taxol treatment. Received orders from Dr. Al Pimple.  Patient received 1 mg ativan IV 30-minutes prior to taxol start. Confirmed that patient had driver prior to administration.

## 2022-11-30 NOTE — Progress Notes (Signed)
Order given for Ativan with taxol

## 2022-11-30 NOTE — Patient Instructions (Signed)
[facility]   Discharge Instructions: Thank you for choosing [facility] to provide your oncology and hematology care.   If you have a lab appointment with the Cancer Center, please go directly to the Cancer Center and check in at the registration area.   Wear comfortable clothing and clothing appropriate for easy access to any Portacath or PICC line.   We strive to give you quality time with your provider. You may need to reschedule your appointment if you arrive late (15 or more minutes).  Arriving late affects you and other patients whose appointments are after yours.  Also, if you miss three or more appointments without notifying the office, you may be dismissed from the clinic at the provider's discretion.      For prescription refill requests, have your pharmacy contact our office and allow 72 hours for refills to be completed.    Today you received the following chemotherapy and/or immunotherapy agents: Paclitaxel (Taxol)      To help prevent nausea and vomiting after your treatment, we encourage you to take your nausea medication as directed.  BELOW ARE SYMPTOMS THAT SHOULD BE REPORTED IMMEDIATELY: *FEVER GREATER THAN 100.4 F (38 C) OR HIGHER *CHILLS OR SWEATING *NAUSEA AND VOMITING THAT IS NOT CONTROLLED WITH YOUR NAUSEA MEDICATION *UNUSUAL SHORTNESS OF BREATH *UNUSUAL BRUISING OR BLEEDING *URINARY PROBLEMS (pain or burning when urinating, or frequent urination) *BOWEL PROBLEMS (unusual diarrhea, constipation, pain near the anus) TENDERNESS IN MOUTH AND THROAT WITH OR WITHOUT PRESENCE OF ULCERS (sore throat, sores in mouth, or a toothache) UNUSUAL RASH, SWELLING OR PAIN  UNUSUAL VAGINAL DISCHARGE OR ITCHING   Items with * indicate a potential emergency and should be followed up as soon as possible or go to the Emergency Department if any problems should occur.  Please show the CHEMOTHERAPY ALERT CARD or IMMUNOTHERAPY ALERT  CARD at check-in to the Emergency Department and triage nurse.  Should you have questions after your visit or need to cancel or reschedule your appointment, please contact [facility]  Dept: [phone]  and follow the prompts.  Office hours are 8:00 a.m. to 4:30 p.m. Monday - Friday. Please note that voicemails left after 4:00 p.m. may not be returned until the following business day.  We are closed weekends and major holidays. You have access to a nurse at all times for urgent questions. Please call the main number to the clinic Dept: [phone] and follow the prompts.   For any non-urgent questions, you may also contact your provider using MyChart. We now offer e-Visits for anyone 18 and older to request care online for non-urgent symptoms. For details visit [email or web address].   Also download the MyChart app! Go to the app store, search "MyChart", open the app, select [place], and log in with your MyChart username and password.   

## 2022-12-01 ENCOUNTER — Telehealth: Payer: Self-pay | Admitting: Hematology and Oncology

## 2022-12-01 NOTE — Telephone Encounter (Signed)
Spoke with patient confirming upcoming appointment  

## 2022-12-02 ENCOUNTER — Other Ambulatory Visit: Payer: Self-pay

## 2022-12-05 ENCOUNTER — Ambulatory Visit: Payer: Self-pay | Admitting: General Surgery

## 2022-12-05 DIAGNOSIS — D241 Benign neoplasm of right breast: Secondary | ICD-10-CM

## 2022-12-05 DIAGNOSIS — Z170 Estrogen receptor positive status [ER+]: Secondary | ICD-10-CM

## 2022-12-05 MED ORDER — KETOROLAC TROMETHAMINE 15 MG/ML IJ SOLN
15.0000 mg | Freq: Once | INTRAMUSCULAR | Status: AC
Start: 2022-12-05 — End: 2022-12-10

## 2022-12-08 ENCOUNTER — Other Ambulatory Visit: Payer: Self-pay | Admitting: General Surgery

## 2022-12-08 DIAGNOSIS — C50412 Malignant neoplasm of upper-outer quadrant of left female breast: Secondary | ICD-10-CM

## 2022-12-11 ENCOUNTER — Encounter: Payer: Self-pay | Admitting: *Deleted

## 2022-12-11 ENCOUNTER — Telehealth: Payer: Self-pay | Admitting: Radiation Oncology

## 2022-12-11 DIAGNOSIS — C50412 Malignant neoplasm of upper-outer quadrant of left female breast: Secondary | ICD-10-CM

## 2022-12-11 NOTE — Telephone Encounter (Signed)
Called to schedule FUN/CT Sim per 7/8 referral. Patient requested to call back due to possibly receiving treatment in [place] instead. Patient stated she would speak to Dr. Remonia Richter office first before scheduling.

## 2022-12-13 ENCOUNTER — Other Ambulatory Visit: Payer: Self-pay | Admitting: *Deleted

## 2022-12-13 ENCOUNTER — Encounter: Payer: Self-pay | Admitting: Hematology and Oncology

## 2022-12-13 ENCOUNTER — Telehealth: Payer: Self-pay | Admitting: *Deleted

## 2022-12-13 DIAGNOSIS — Z170 Estrogen receptor positive status [ER+]: Secondary | ICD-10-CM

## 2022-12-13 NOTE — Telephone Encounter (Signed)
Faxed referral for radiation oncology to [facility] at [phone]. Receipt of fax was received. Made pt aware through MyChart message.

## 2022-12-14 ENCOUNTER — Other Ambulatory Visit: Payer: Self-pay

## 2022-12-15 ENCOUNTER — Other Ambulatory Visit: Payer: Self-pay

## 2022-12-18 NOTE — H&P (View-Only) (Signed)
Patient ID: Nicole Maddox, female    DOB: [birth year], 42 y.o.   MRN: [id number]  Chief Complaint  Patient presents with   Pre-op Exam      ICD-10-CM   1. Malignant neoplasm of upper-outer quadrant of left breast in female, estrogen receptor positive (HCC)  C50.412    Z17.0        History of Present Illness: Nicole Maddox is a 42 y.o.  female  with a history of left-sided breast cancer.  She presents for preoperative evaluation for upcoming procedure, left breast partial mastectomy and bilateral oncoplastic mastopexy for symmetry, scheduled for 01/04/2023 with Dr. Ulice Bold in conjunction with Dr. Carolynne Edouard.  The patient has not had problems with anesthesia.  She discontinued oral contraceptives in 2016.  Denies any personal or family history of blood clots or clotting disorder.  She also denies any personal history of anticoagulation, severe cardiac or pulmonary disease, or varicosities.  Discussed expectations regarding surgery.  She like to proceed with bilateral mastectomy and believes that Dr. Carolynne Edouard is removing a lesion from the right breast as well as the malignancy from the left breast.  She lives alone, but her mother will be staying with her during her postoperative recovery.  She states that she will be commuting from [place].  She is a non-smoker and does not use any nicotine-containing products.  She does have a history of keloiding and required earlobe excision in the past.  No hypertrophic scarring or keloiding from recent port placement.  Summary of Previous Visit: Patient first had her consult with Dr. Ulice Bold on 06/30/2022.  She has since had 2 subsequent encounters, most recently 10/17/2022.  Discussed left-sided lumpectomy/partial mastectomy as well as a simultaneous bilateral oncoplastic mastopexy for symmetry. While the note remarked on ipsilateral mastopexy, confirmed with surgeon and scheduler that planning for bilateral mastopexy.   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  PMH Significant for: Left-sided breast cancer, GERD, hypothyroidism seen by functional medicine.   Past Medical History: Allergies: Allergies  Allergen Reactions   Adriamycin [Doxorubicin] Other (See Comments)    Patient c/o of lower back pain and right arm pain. See progress note from 07/20/22    Current Medications:  Current Outpatient Medications:    cephALEXin (KEFLEX) 500 MG capsule, Take 1 capsule (500 mg total) by mouth 4 (four) times daily for 3 days., Disp: 12 capsule, Rfl: 0   FERROCITE 324 MG TABS tablet, Take 1 tablet by mouth daily., Disp: , Rfl:    folic acid (FOLVITE) 1 MG tablet, Take 1 mg by mouth daily., Disp: , Rfl:    lidocaine-prilocaine (EMLA) cream, Apply to affected area once, Disp: 30 g, Rfl: 3   methimazole (TAPAZOLE) 5 MG tablet, Take 15 mg by mouth every morning., Disp: , Rfl:    Multiple Vitamin (MULTIVITAMIN PO), Take by mouth., Disp: , Rfl:    omeprazole (PRILOSEC) 40 MG capsule, Take 40 mg by mouth daily., Disp: , Rfl:    ondansetron (ZOFRAN) 8 MG tablet, PLEASE SEE ATTACHED FOR DETAILED DIRECTIONS, Disp: 18 tablet, Rfl: 3   oxyCODONE (ROXICODONE) 5 MG immediate release tablet, Take 1 tablet (5 mg total) by mouth every 6 (six) hours as needed for up to 5 days for severe pain., Disp: 20 tablet, Rfl: 0   prochlorperazine (COMPAZINE) 10 MG tablet, Take 1 tablet (10 mg total) by mouth every 6 (six) hours as needed for nausea  or vomiting., Disp: 30 tablet, Rfl: 1  Past Medical Problems: Past Medical History:  Diagnosis Date   Breast cancer (HCC) 06/23/2022   Family history of stomach cancer    GERD (gastroesophageal reflux disease)    Hyperthyroidism    Thyroid disease     Past Surgical History: Past Surgical History:  Procedure Laterality Date   BREAST BIOPSY Left 06/23/2022   [place] LT BREAST BX W LOC DEV 1ST LESION IMG BX SPEC US GUIDE 06/23/2022 [facility] MAMMOGRAPHY   PORTACATH  PLACEMENT Right 07/19/2022   Procedure: INSERTION PORT-A-CATH;  Surgeon: Griselda Miner, MD;  Location: [facility];  Service: General;  Laterality: Right;  60 MIN ROOM 8   WISDOM TOOTH EXTRACTION      Social History: Social History   Socioeconomic History   Marital status: Single    Spouse name: Not on file   Number of children: Not on file   Years of education: Not on file   Highest education level: Not on file  Occupational History   Not on file  Tobacco Use   Smoking status: Never   Smokeless tobacco: Never  Vaping Use   Vaping status: Never Used  Substance and Sexual Activity   Alcohol use: Yes    Comment: socially   Drug use: No   Sexual activity: Yes    Partners: Male    Birth control/protection: None    Comment: intercourse age 7, more than 6 sexual parters,   Other Topics Concern   Not on file  Social History Narrative   Not on file   Social Determinants of Health   Financial Resource Strain: Low Risk  (07/04/2022)   Overall Financial Resource Strain (CARDIA)    Difficulty of Paying Living Expenses: Not very hard  Food Insecurity: No Food Insecurity (06/28/2022)   Hunger Vital Sign    Worried About Running Out of Food in the Last Year: Never true    Ran Out of Food in the Last Year: Never true  Transportation Needs: No Transportation Needs (06/28/2022)   PRAPARE - Administrator, Civil Service (Medical): No    Lack of Transportation (Non-Medical): No  Physical Activity: Not on file  Stress: Not on file  Social Connections: Unknown (12/14/2022)   [facility]   Social Connections    Frequency of Communication with Friends and Family: Not asked    Frequency of Social Gatherings with Friends and Family: Not asked  Intimate Partner Violence: Unknown (12/14/2022)   Received from [facility]   Intimate Partner Violence    Fear of Current or Ex-Partner: Not asked    Emotionally Abused: Not asked    Physically Abused:  Not asked    Sexually Abused: Not asked    Family History: Family History  Problem Relation Age of Onset   Heart attack Father 11   Stomach cancer Maternal Aunt        dx > 50   Throat cancer Maternal Uncle        dx 39s   Stomach cancer Maternal Uncle        dx > 50   Heart Problems Maternal Grandmother    Breast cancer Cousin        mother's maternal first cousin    Review of Systems: ROS Denies any exertional chest pain or difficulty breathing, fevers, leg swelling, or fevers.  Physical Exam: Vital Signs BP 118/78 (BP Location: Left Arm, Patient Position: Sitting, Cuff Size: Normal)  Pulse 67   Ht 5\' 4"  (1.626 m)   Wt 180 lb 6.4 oz (81.8 kg)   SpO2 100%   BMI 30.97 kg/m   Physical Exam Constitutional:      General: Not in acute distress.    Appearance: Normal appearance. Not ill-appearing.  HENT:     Head: Normocephalic and atraumatic.  Eyes:     Pupils: Pupils are equal, round. Cardiovascular:     Rate and Rhythm: Normal rate.    Pulses: Normal pulses.  Pulmonary:     Effort: No respiratory distress or increased work of breathing.  Speaks in full sentences. Abdominal:     General: Abdomen is flat. No distension.   Musculoskeletal: Normal range of motion. No lower extremity swelling or edema. No varicosities. Skin:    General: Skin is warm and dry.     Findings: No erythema or rash.  Neurological:     Mental Status: Alert and oriented to person, place, and time.  Psychiatric:        Mood and Affect: Mood normal.        Behavior: Behavior normal.    Assessment/Plan: The patient is scheduled for left-sided lumpectomy and bilateral mastopexy with Dr. Ulice Bold.  Risks, benefits, and alternatives of procedure discussed, questions answered and consent obtained.    Smoking Status: Non-smoker. Last Mammogram: 11/2022; Results: Biopsy-proven left-sided malignancy.  Caprini Score: 7; Risk Factors include: BMI greater than 25, left-sided breast cancer, port  placement, and length of planned surgery. Recommendation for mechanical and possibly pharmacological prophylaxis.  Will prescribe Lovenox if indicated, otherwise we will encourage early ambulation.   Pictures obtained: 06/2022  Post-op Rx sent to pharmacy: Oxycodone and Keflex.  Patient reports that she has ample Zofran at home.  Patient was provided with the General Surgical Risk consent document and Pain Medication Agreement prior to their appointment.  They had adequate time to read through the risk consent documents and Pain Medication Agreement. We also discussed them in person together during this preop appointment. All of their questions were answered to their satisfaction.  Recommended calling if they have any further questions.  Risk consent form and Pain Medication Agreement to be scanned into patient's chart.  The risk that can be encountered with mastopexy/breast lift were discussed and include the following but not limited to these:  Breast asymmetry, fluid accumulation, firmness of the breast, inability to breast feed, loss of nipple or areola, skin loss, decrease or no nipple sensation, fat necrosis of the breast tissue, bleeding, infection, healing delay.  There are risks of anesthesia, changes to skin sensation and injury to nerves or blood vessels.  The muscle can be temporarily or permanently injured.  You may have an allergic reaction to tape, suture, glue, blood products which can result in skin discoloration, swelling, pain, skin lesions, poor healing.  Any of these can lead to the need for revisonal surgery or stage procedures.  A mastopexy has potential to interfere with diagnostic procedures.  Nipple or breast piercing can increase risks of infection.  This procedure is best done when the breast is fully developed.  Changes in the breast will continue to occur over time.  Pregnancy can alter the outcomes of previous breast surgery, weight gain and weigh loss can also effect the long  term appearance.     Electronically signed by: Evelena Leyden, PA-C 12/19/2022 1:00 PM

## 2022-12-18 NOTE — Progress Notes (Unsigned)
Patient ID: Nicole Maddox, female    DOB: [birth year], 42 y.o.   MRN: [id number]  No chief complaint on file.   No diagnosis found.   History of Present Illness: Nicole Maddox is a 42 y.o.  female  with a history of left-sided breast cancer.  She presents for preoperative evaluation for upcoming procedure, left breast partial mastectomy and mastopexy, scheduled for 01/04/2023 with Dr. Ulice Bold in conjunction with Dr. Carolynne Edouard.  The patient {HAS HAS [id number]} had problems with anesthesia. ***  Summary of Previous Visit: Patient first had her consult with Dr. Ulice Bold on 06/30/2022.  She has since had 2 subsequent encounters, most recently 10/17/2022.  Discussed left-sided lumpectomy/partial mastectomy as well as a simultaneous ipsilateral mastopexy.  Job: ***  PMH Significant for: Left-sided breast cancer, GERD.***   Past Medical History: Allergies: Allergies  Allergen Reactions   Adriamycin [Doxorubicin] Other (See Comments)    Patient c/o of lower back pain and right arm pain. See progress note from 07/20/22    Current Medications:  Current Outpatient Medications:    FERROCITE 324 MG TABS tablet, Take 1 tablet by mouth daily., Disp: , Rfl:    folic acid (FOLVITE) 1 MG tablet, Take 1 mg by mouth daily., Disp: , Rfl:    lidocaine-prilocaine (EMLA) cream, Apply to affected area once, Disp: 30 g, Rfl: 3   methimazole (TAPAZOLE) 5 MG tablet, Take 15 mg by mouth every morning., Disp: , Rfl:    Multiple Vitamin (MULTIVITAMIN PO), Take by mouth., Disp: , Rfl:    omeprazole (PRILOSEC) 40 MG capsule, Take 40 mg by mouth daily., Disp: , Rfl:    ondansetron (ZOFRAN) 8 MG tablet, PLEASE SEE ATTACHED FOR DETAILED DIRECTIONS, Disp: 18 tablet, Rfl: 3   prochlorperazine (COMPAZINE) 10 MG tablet, Take 1 tablet (10 mg total) by mouth every 6 (six) hours as needed for nausea or vomiting., Disp: 30 tablet, Rfl: 1  Past Medical Problems: Past Medical History:  Diagnosis Date   Breast cancer  (HCC) 06/23/2022   Family history of stomach cancer    GERD (gastroesophageal reflux disease)    Hyperthyroidism    Thyroid disease     Past Surgical History: Past Surgical History:  Procedure Laterality Date   BREAST BIOPSY Left 06/23/2022   [place] LT BREAST BX W LOC DEV 1ST LESION IMG BX SPEC US GUIDE 06/23/2022 [facility] MAMMOGRAPHY   PORTACATH PLACEMENT Right 07/19/2022   Procedure: INSERTION PORT-A-CATH;  Surgeon: Griselda Miner, MD;  Location: [facility];  Service: General;  Laterality: Right;  60 MIN ROOM 8   WISDOM TOOTH EXTRACTION      Social History: Social History   Socioeconomic History   Marital status: Single    Spouse name: Not on file   Number of children: Not on file   Years of education: Not on file   Highest education level: Not on file  Occupational History   Not on file  Tobacco Use   Smoking status: Never   Smokeless tobacco: Never  Vaping Use   Vaping status: Never Used  Substance and Sexual Activity   Alcohol use: Yes    Comment: socially   Drug use: No   Sexual activity: Yes    Partners: Male    Birth control/protection: None    Comment: intercourse age 63, more than 6 sexual parters,   Other Topics Concern   Not on file  Social History Narrative   Not on file   Social Determinants  of Health   Financial Resource Strain: Low Risk  (07/04/2022)   Overall Financial Resource Strain (CARDIA)    Difficulty of Paying Living Expenses: Not very hard  Food Insecurity: No Food Insecurity (06/28/2022)   Hunger Vital Sign    Worried About Running Out of Food in the Last Year: Never true    Ran Out of Food in the Last Year: Never true  Transportation Needs: No Transportation Needs (06/28/2022)   PRAPARE - Administrator, Civil Service (Medical): No    Lack of Transportation (Non-Medical): No  Physical Activity: Not on file  Stress: Not on file  Social Connections: Not on file  Intimate Partner Violence: Not At Risk (06/28/2022)    Humiliation, Afraid, Rape, and Kick questionnaire    Fear of Current or Ex-Partner: No    Emotionally Abused: No    Physically Abused: No    Sexually Abused: No    Family History: Family History  Problem Relation Age of Onset   Heart attack Father 82   Stomach cancer Maternal Aunt        dx > 50   Throat cancer Maternal Uncle        dx 36s   Stomach cancer Maternal Uncle        dx > 50   Heart Problems Maternal Grandmother    Breast cancer Cousin        mother's maternal first cousin    Review of Systems: ROS  Physical Exam: Vital Signs There were no vitals taken for this visit.  Physical Exam *** Constitutional:      General: Not in acute distress.    Appearance: Normal appearance. Not ill-appearing.  HENT:     Head: Normocephalic and atraumatic.  Eyes:     Pupils: Pupils are equal, round. Cardiovascular:     Rate and Rhythm: Normal rate.    Pulses: Normal pulses.  Pulmonary:     Effort: No respiratory distress or increased work of breathing.  Speaks in full sentences. Abdominal:     General: Abdomen is flat. No distension.   Musculoskeletal: Normal range of motion. No lower extremity swelling or edema. No varicosities. *** Skin:    General: Skin is warm and dry.     Findings: No erythema or rash.  Neurological:     Mental Status: Alert and oriented to person, place, and time.  Psychiatric:        Mood and Affect: Mood normal.        Behavior: Behavior normal.    Assessment/Plan: The patient is scheduled for *** with Dr. Jenean Lindau.  Risks, benefits, and alternatives of procedure discussed, questions answered and consent obtained.    Smoking Status: ***; Counseling Given? *** Last Mammogram: ***; Results: ***  Caprini Score: ***; Risk Factors include: ***, BMI *** 25, and length of planned surgery. Recommendation for mechanical *** pharmacological prophylaxis. Encourage early ambulation.   Pictures obtained:  ***  Post-op Rx sent to pharmacy: ***  Patient was provided with the *** General Surgical Risk consent document and Pain Medication Agreement prior to their appointment.  They had adequate time to read through the risk consent documents and Pain Medication Agreement. We also discussed them in person together during this preop appointment. All of their questions were answered to their satisfaction.  Recommended calling if they have any further questions.  Risk consent form and Pain Medication Agreement to be scanned into patient's chart.  ***   Electronically signed by: Evelena Leyden, PA-C  12/18/2022 10:45 AM

## 2022-12-19 ENCOUNTER — Ambulatory Visit: Payer: Managed Care, Other (non HMO) | Admitting: Physician Assistant

## 2022-12-19 ENCOUNTER — Encounter: Payer: Self-pay | Admitting: Physician Assistant

## 2022-12-19 VITALS — BP 118/78 | HR 67 | Ht 64.0 in | Wt 180.4 lb

## 2022-12-19 DIAGNOSIS — C50412 Malignant neoplasm of upper-outer quadrant of left female breast: Secondary | ICD-10-CM

## 2022-12-19 DIAGNOSIS — Z170 Estrogen receptor positive status [ER+]: Secondary | ICD-10-CM

## 2022-12-19 MED ORDER — CEPHALEXIN 500 MG PO CAPS: 500.0000 mg | ORAL_CAPSULE | Freq: Four times a day (QID) | ORAL | 0 refills | Status: AC

## 2022-12-19 MED ORDER — OXYCODONE HCL 5 MG PO TABS: 5.0000 mg | ORAL_TABLET | Freq: Four times a day (QID) | ORAL | 0 refills | Status: DC | PRN

## 2022-12-25 ENCOUNTER — Telehealth: Payer: Self-pay

## 2022-12-25 NOTE — Telephone Encounter (Signed)
Spoke to patient about needing updated release of information. Patient will be coming into office 12/28/22 for visit and will sign release form at that time.

## 2022-12-27 NOTE — Pre-Procedure Instructions (Signed)
Surgical Instructions   Your procedure is scheduled on Thursday, August 1st. Report to [facility] Main Entrance "A" at 09:15 A.M., then check in with the Admitting office. Any questions or running late day of surgery: call [phone]  Questions prior to your surgery date: call [phone], Monday-Friday, 8am-4pm. If you experience any cold or flu symptoms such as cough, fever, chills, shortness of breath, etc. between now and your scheduled surgery, please notify us at the above number.     Remember:  Do not eat after midnight the night before your surgery   You may drink clear liquids until 08:15 AM the morning of your surgery.   Clear liquids allowed are: Water, Non-Citrus Juices (without pulp), Carbonated Beverages, Clear Tea, Black Coffee Only (NO MILK, CREAM OR POWDERED CREAMER of any kind), and Gatorade.    Take these medicines the morning of surgery with A SIP OF WATER: none    One week prior to surgery, STOP taking any Aspirin (unless otherwise instructed by your surgeon) Aleve, Naproxen, Ibuprofen, Motrin, Advil, Goody's, BC's, all herbal medications, fish oil, and non-prescription vitamins.                     Do NOT Smoke (Tobacco/Vaping) for 24 hours prior to your procedure.  If you use a CPAP at night, you may bring your mask/headgear for your overnight stay.   You will be asked to remove any contacts, glasses, piercing's, hearing aid's, dentures/partials prior to surgery. Please bring cases for these items if needed.    Patients discharged the day of surgery will not be allowed to drive home, and someone needs to stay with them for 24 hours.  SURGICAL WAITING ROOM VISITATION Patients may have no more than 2 support people in the waiting area - these visitors may rotate.   Pre-op nurse will coordinate an appropriate time for 1 ADULT support person, who may not rotate, to accompany patient in pre-op.  Children under the age of 74 must have an adult with them who is  not the patient and must remain in the main waiting area with an adult.  If the patient needs to stay at the hospital during part of their recovery, the visitor guidelines for inpatient rooms apply.  Please refer to the [facility] website for the visitor guidelines for any additional information.   If you received a COVID test during your pre-op visit  it is requested that you wear a mask when out in public, stay away from anyone that may not be feeling well and notify your surgeon if you develop symptoms. If you have been in contact with anyone that has tested positive in the last 10 days please notify you surgeon.      Pre-operative CHG Bathing Instructions   You can play a key role in reducing the risk of infection after surgery. Your skin needs to be as free of germs as possible. You can reduce the number of germs on your skin by washing with CHG (chlorhexidine gluconate) soap before surgery. CHG is an antiseptic soap that kills germs and continues to kill germs even after washing.   DO NOT use if you have an allergy to chlorhexidine/CHG or antibacterial soaps. If your skin becomes reddened or irritated, stop using the CHG and notify one of our RNs at [phone].              TAKE A SHOWER THE NIGHT BEFORE SURGERY AND THE DAY OF SURGERY  Please keep in mind the following:  DO NOT shave, including legs and underarms, 48 hours prior to surgery.   You may shave your face before/day of surgery.  Place clean sheets on your bed the night before surgery Use a clean washcloth (not used since being washed) for each shower. DO NOT sleep with pet's night before surgery.  CHG Shower Instructions:  If you choose to wash your hair and private area, wash first with your normal shampoo/soap.  After you use shampoo/soap, rinse your hair and body thoroughly to remove shampoo/soap residue.  Turn the water OFF and apply half the bottle of CHG soap to a CLEAN washcloth.  Apply CHG soap ONLY FROM  YOUR NECK DOWN TO YOUR TOES (washing for 3-5 minutes)  DO NOT use CHG soap on face, private areas, open wounds, or sores.  Pay special attention to the area where your surgery is being performed.  If you are having back surgery, having someone wash your back for you may be helpful. Wait 2 minutes after CHG soap is applied, then you may rinse off the CHG soap.  Pat dry with a clean towel  Put on clean pajamas    Additional instructions for the day of surgery: DO NOT APPLY any lotions, deodorants, cologne, or perfumes.   Do not wear jewelry or makeup Do not wear nail polish, gel polish, artificial nails, or any other type of covering on natural nails (fingers and toes) Do not bring valuables to the hospital. [facility] is not responsible for valuables/personal belongings. Put on clean/comfortable clothes.  Please brush your teeth.  Ask your nurse before applying any prescription medications to the skin.

## 2022-12-28 ENCOUNTER — Encounter (HOSPITAL_COMMUNITY): Payer: Self-pay

## 2022-12-28 ENCOUNTER — Encounter (HOSPITAL_COMMUNITY)
Admission: RE | Admit: 2022-12-28 | Discharge: 2022-12-28 | Disposition: A | Discharge status: Home / Self Care | Payer: Managed Care, Other (non HMO) | Source: Ambulatory Visit | Attending: General Surgery | Admitting: General Surgery

## 2022-12-28 ENCOUNTER — Other Ambulatory Visit: Payer: Self-pay

## 2022-12-28 VITALS — BP 115/65 | HR 73 | Temp 97.7°F | Resp 18 | Ht 64.0 in | Wt 182.3 lb

## 2022-12-28 DIAGNOSIS — E0500 Thyrotoxicosis with diffuse goiter without thyrotoxic crisis or storm: Secondary | ICD-10-CM | POA: Insufficient documentation

## 2022-12-28 DIAGNOSIS — Z01812 Encounter for preprocedural laboratory examination: Secondary | ICD-10-CM | POA: Diagnosis present

## 2022-12-28 DIAGNOSIS — E042 Nontoxic multinodular goiter: Secondary | ICD-10-CM | POA: Diagnosis not present

## 2022-12-28 DIAGNOSIS — C50912 Malignant neoplasm of unspecified site of left female breast: Secondary | ICD-10-CM | POA: Insufficient documentation

## 2022-12-28 DIAGNOSIS — K219 Gastro-esophageal reflux disease without esophagitis: Secondary | ICD-10-CM | POA: Diagnosis not present

## 2022-12-28 DIAGNOSIS — C50911 Malignant neoplasm of unspecified site of right female breast: Secondary | ICD-10-CM | POA: Insufficient documentation

## 2022-12-28 DIAGNOSIS — Z01818 Encounter for other preprocedural examination: Secondary | ICD-10-CM

## 2022-12-28 HISTORY — DX: Nontoxic multinodular goiter: E04.2

## 2022-12-28 LAB — BASIC METABOLIC PANEL
Anion gap: 8 (ref 5–15)
BUN: 5 mg/dL — ABNORMAL LOW (ref 6–20)
CO2: 27 mmol/L (ref 22–32)
Calcium: 9 mg/dL (ref 8.9–10.3)
Chloride: 101 mmol/L (ref 98–111)
Creatinine, Ser: 0.69 mg/dL (ref 0.44–1.00)
GFR, Estimated: >60 mL/min (ref >60)
Glucose, Bld: 79 mg/dL (ref 70–99)
Potassium: 3.7 mmol/L (ref 3.5–5.1)
Sodium: 136 mmol/L (ref 135–145)

## 2022-12-28 LAB — CBC
HCT: 38 % (ref 36.0–46.0)
Hemoglobin: 12.1 g/dL (ref 12.0–15.0)
MCH: 27.1 pg (ref 26.0–34.0)
MCHC: 31.8 g/dL (ref 30.0–36.0)
MCV: 85 fL (ref 80.0–100.0)
Platelets: 283 10*3/uL (ref 150–400)
RBC: 4.47 MIL/uL (ref 3.87–5.11)
RDW: 18.6 % — ABNORMAL HIGH (ref 11.5–15.5)
WBC: 6.4 10*3/uL (ref 4.0–10.5)
nRBC: 0 % (ref 0.0–0.2)

## 2022-12-28 LAB — POCT PREGNANCY, URINE: Preg Test, Ur: NEGATIVE

## 2022-12-28 NOTE — Progress Notes (Addendum)
PCP -  [facility] Signs Cardiologist -  Denies  PPM/ICD - Denies Device Orders - N/A Rep Notified - N/A  Chest x-ray - 07-22-22 EKG - 07-22-22 Stress Test - Denies ECHO - Denies Cardiac Cath - Denies  Sleep Study - Denies CPAP - N/A  DM- Denies  Blood Thinner Instructions:Denies Aspirin Instructions:Denies  ERAS Protcol -Yes No drink  COVID TEST- N/A  Anesthesia review: Yes, breast seed placement  Patient denies shortness of breath, fever, cough and chest pain at PAT appointment   All instructions explained to the patient, with a verbal understanding of the material. Patient agrees to go over the instructions while at home for a better understanding. Patient also instructed to self quarantine after being tested for COVID-19. The opportunity to ask questions was provided.

## 2022-12-29 ENCOUNTER — Telehealth: Payer: Self-pay

## 2022-12-29 ENCOUNTER — Encounter (HOSPITAL_COMMUNITY): Payer: Self-pay

## 2022-12-29 NOTE — Telephone Encounter (Signed)
Notified Patient of completion of Cancer Information Form for Disability benefits. Fax transmission confirmation received. Copy of form placed fro pick-up as requested by Patient. No other needs or concerns voiced at this time.

## 2022-12-29 NOTE — Progress Notes (Signed)
Anesthesia Chart Review:  Case: [id number] Date/Time: 01/04/23 1100   Procedures:      LEFT BREAST LUMPECTOMY WITH RADIOACTIVE SEED AND SENTINEL LYMPH NODE BIOPSY (Left) - PEC BLOCK     RIGHT BREAST LUMPECTOMY WITH RADIOACTIVE SEED LOCALIZATION (Right)     BREAST REDUCTION WITH MASTOPEXY (Bilateral)   Anesthesia type: General   Pre-op diagnosis: LEFT BREAST CANCER, RIGHT BREAST PAPILLOMA   [facility]: [facility] / [facility]   Surgeons: Griselda Miner, MD; Peggye Form, DO       DISCUSSION: Patient is a 42 year old female scheduled for the above procedure.  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 Right Ramona vein Port-a-cath 07/19/22.  Last Taxol noted was on 11/30/22.  Hyperthyroidism and multinodular goiter are followed by [facility] endocrinologist Dr. Governor Rooks, last visit 07/28/22.  She has been on methimazole since 2023. Noted, "Aware of permanent treatment options of hyperthyroidism, radioactive iodine ablation and thyroidectomy, but at this point wishes to continue with methimazole. She is aware that she needs regular blood work to make sure her TFTs are normal." In regards to MNG, [place] in 04/2022 showed slightly larger right lobe nodules meeting criteria for FNA, but given recent breast cancer diagnosis and chemotherapy, patient desired to repeat US and consider FNA then if nodules were not smaller. Six month follow-up planned. TSH and Free T4 were normal in April 2024.  RSL scheduled for 7/30/224 at 2:00 PM. Urine pregnancy test negative on 12/28/22.  Anesthesia team to evaluate on the day of surgery.   VS: BP 115/65   Pulse 73   Temp 36.5 C (Oral)   Resp 18   Ht 5\' 4"  (1.626 m)   Wt 82.7 kg   LMP 09/08/2022 Comment: no period since starting chemo  SpO2 100%   BMI  31.29 kg/m    PROVIDERS: Paruchuri, Janace Hoard, MD is PCP  Rachel Moulds, MD is HEM-ONC Dorothy Puffer, MD is RAD-ONC Gardiner Barefoot, MD is endocrinologist ([facility])   LABS: Labs reviewed: Acceptable for surgery. (all labs ordered are listed, but only abnormal results are displayed)  Labs Reviewed  BASIC METABOLIC PANEL - Abnormal; Notable for the following components:      Result Value   BUN 5 (*)    All other components within normal limits  CBC - Abnormal; Notable for the following components:   RDW 18.6 (*)    All other components within normal limits  POCT PREGNANCY, URINE  TSH 2.311 and Free T4 0.75 on 09/04/22 ([facility]where)   IMAGES: CT Abd/pelvis 08/15/22: IMPRESSION: 1. Low-attenuation lesions in the liver measuring 1.2 cm or greater are compatible with cysts. Additional subcentimeter low-attenuation lesions in the liver are too small to definitively characterize but cysts and/or biliary hamartomas are favored. 2. Fibroid uterus.   CTA Chest 07/22/22: IMPRESSION: 1. Negative examination for pulmonary embolism. 2. Left axillary lymphadenopathy containing biopsy marking clip. 3. Postoperative findings of bilateral breast biopsy and/or lumpectomy. 4. Multiple low-attenuation liver lesions, the largest of which is clearly a simple cyst, others too small to characterize. Correlate with staging imaging of the abdomen in the setting of known breast cancer.    US Thyroid 05/03/22 ([facility]): FINDINGS:  RIGHT LOBE: Homogeneous measuring 4.7 x 1.9 x 1.9 cm.  LEFT LOBE: Homogeneous measuring 5.0 x 1.8 x 1.7 cm.  ISTHMUS:  Measures 0.7 cm.   NODULES:  1. 1.5 cm right superior solid (+2), isoechoic (+1), wider-than-tall, with smooth margins, and with punctate echogenic foci (+3). Total score = 6 points. TR4.  2. Ill-defined, 1.0 cm right inferior solid (+2), isoechoic (+1), wider-than-tall, with ill-defined margins, and with punctate echogenic foci (+3). Total  score = 6 points. TR4.  3. 1.0 cm right mid solid (+2), isoechoic (+1), wider-than-tall, with ill-defined margins, and with no calcifications. Total score = 3 points. TR3.   LYMPHADENOPATHY: None.  OTHER FINDINGS: None.    EKG:  EKG 07/22/22 (done in ED for SOB, hypotension, diarrhea in setting of chemotherapy):  Right and left arm electrode reversal, interpretation assumes no reversal Sinus or ectopic atrial rhythm Right axis deviation Low voltage, precordial leads Nonspecific T abnormalities, lateral leads Confirmed by Ernie Avena ([id number]) on 07/22/2022 5:58:30 PM  EKG 12/21/21 ([facility]): Per Narrative: Rhythm: Normal sinus rhythm at 77 beats per minute  Axis: Normal axis  Intervals: 1st degree AV block  QRS Complex: Normal  ST Segment: Normal ST-T segments  QT Interval: Normal    CV: N/A   Past Medical History:  Diagnosis Date   Breast cancer (HCC) 06/23/2022   Family history of stomach cancer    GERD (gastroesophageal reflux disease)    Hyperthyroidism    due to Graves' disease   Multinodular thyroid    Thyroid disease     Past Surgical History:  Procedure Laterality Date   BREAST BIOPSY Left 06/23/2022   [place] LT BREAST BX W LOC DEV 1ST LESION IMG BX SPEC US GUIDE 06/23/2022 [facility]MOGRAP[facility]   PORTACATH PLACEMENT Right 07/19/2022   Procedure: INSERTION PORT-A-CATH;  Surgeon: Griselda Miner, MD;  Location: [facility];  Service: General;  Laterality: Right;  60 MIN ROOM 8   WISDOM TOOTH EXTRACTION      MEDICATIONS:  DENTA 5000 PLUS 1.1 % CREA dental cream   folic acid (FOLVITE) 1 MG tablet   lidocaine-prilocaine (EMLA) cream   loratadine (CLARITIN) 10 MG tablet   methimazole (TAPAZOLE) 5 MG tablet   omeprazole (PRILOSEC) 40 MG capsule   ondansetron (ZOFRAN) 8 MG tablet   prochlorperazine (COMPAZINE) 10 MG tablet   No current facility-administered medications for this encounter.    Shonna Chock, PA-C Surgical Short Stay/Anesthesiology [facility]  Phone [phone] [facility] Phone [phone] 12/29/2022 7:16 PM

## 2022-12-29 NOTE — Anesthesia Preprocedure Evaluation (Signed)
Anesthesia Evaluation  Patient identified by MRN, date of birth, ID band Patient awake    Reviewed: Allergy & Precautions, NPO status , Patient's Chart, lab work & pertinent test results  Airway Mallampati: II  TM Distance: >3 FB Neck ROM: Full    Dental  (+) Teeth Intact, Dental Advisory Given   Pulmonary neg pulmonary ROS   breath sounds clear to auscultation       Cardiovascular  Rhythm:Regular Rate:Normal     Neuro/Psych negative neurological ROS  negative psych ROS   GI/Hepatic Neg liver ROS,GERD  ,,  Endo/Other   Hyperthyroidism   Renal/GU negative Renal ROS     Musculoskeletal negative musculoskeletal ROS (+)    Abdominal   Peds  Hematology negative hematology ROS (+)   Anesthesia Other Findings   Reproductive/Obstetrics                             Anesthesia Physical Anesthesia Plan  ASA: 2  Anesthesia Plan: General   Post-op Pain Management: Regional block*   Induction: Intravenous  PONV Risk Score and Plan: 4 or greater and Ondansetron, Dexamethasone, Midazolam and Scopolamine patch - Pre-op  Airway Management Planned: Oral ETT  Additional Equipment: None  Intra-op Plan:   Post-operative Plan: Extubation in OR  Informed Consent: I have reviewed the patients History and Physical, chart, labs and discussed the procedure including the risks, benefits and alternatives for the proposed anesthesia with the patient or authorized representative who has indicated his/her understanding and acceptance.     Dental advisory given  Plan Discussed with: CRNA  Anesthesia Plan Comments: (PAT note written 12/29/2022 by Shonna Chock, PA-C.  )       Anesthesia Quick Evaluation

## 2023-01-02 ENCOUNTER — Other Ambulatory Visit: Payer: Self-pay | Admitting: General Surgery

## 2023-01-02 ENCOUNTER — Ambulatory Visit
Admission: RE | Admit: 2023-01-02 | Discharge: 2023-01-02 | Disposition: A | Discharge status: Home / Self Care | Payer: Managed Care, Other (non HMO) | Source: Ambulatory Visit | Attending: General Surgery | Admitting: General Surgery

## 2023-01-02 DIAGNOSIS — D241 Benign neoplasm of right breast: Secondary | ICD-10-CM

## 2023-01-02 DIAGNOSIS — Z170 Estrogen receptor positive status [ER+]: Secondary | ICD-10-CM

## 2023-01-02 HISTORY — PX: BREAST BIOPSY: SHX20

## 2023-01-04 ENCOUNTER — Encounter: Payer: Self-pay | Admitting: Hematology and Oncology

## 2023-01-04 ENCOUNTER — Encounter (HOSPITAL_COMMUNITY)
Admission: RE | Disposition: A | Discharge status: Home / Self Care | Payer: Self-pay | Source: Ambulatory Visit | Attending: General Surgery

## 2023-01-04 ENCOUNTER — Other Ambulatory Visit: Payer: Self-pay

## 2023-01-04 ENCOUNTER — Ambulatory Visit (HOSPITAL_COMMUNITY): Payer: Managed Care, Other (non HMO) | Admitting: Anesthesiology

## 2023-01-04 ENCOUNTER — Encounter (HOSPITAL_COMMUNITY)
Admission: RE | Admit: 2023-01-04 | Discharge: 2023-01-04 | Disposition: A | Discharge status: Home / Self Care | Payer: Managed Care, Other (non HMO) | Source: Ambulatory Visit | Attending: General Surgery | Admitting: General Surgery

## 2023-01-04 ENCOUNTER — Ambulatory Visit (HOSPITAL_BASED_OUTPATIENT_CLINIC_OR_DEPARTMENT_OTHER)
Admission: RE | Admit: 2023-01-04 | Discharge: 2023-01-04 | Disposition: A | Discharge status: Home / Self Care | Payer: Managed Care, Other (non HMO) | Source: Ambulatory Visit | Attending: General Surgery | Admitting: General Surgery

## 2023-01-04 ENCOUNTER — Ambulatory Visit
Admit: 2023-01-04 | Discharge: 2023-01-04 | Disposition: A | Discharge status: Home / Self Care | Payer: Managed Care, Other (non HMO) | Attending: General Surgery | Admitting: General Surgery

## 2023-01-04 ENCOUNTER — Ambulatory Visit (HOSPITAL_COMMUNITY): Payer: Managed Care, Other (non HMO) | Admitting: Vascular Surgery

## 2023-01-04 ENCOUNTER — Encounter (HOSPITAL_COMMUNITY): Payer: Self-pay | Admitting: General Surgery

## 2023-01-04 DIAGNOSIS — Z170 Estrogen receptor positive status [ER+]: Secondary | ICD-10-CM

## 2023-01-04 DIAGNOSIS — C50412 Malignant neoplasm of upper-outer quadrant of left female breast: Secondary | ICD-10-CM

## 2023-01-04 DIAGNOSIS — N62 Hypertrophy of breast: Secondary | ICD-10-CM | POA: Diagnosis not present

## 2023-01-04 DIAGNOSIS — C50912 Malignant neoplasm of unspecified site of left female breast: Secondary | ICD-10-CM | POA: Diagnosis present

## 2023-01-04 DIAGNOSIS — N6022 Fibroadenosis of left breast: Secondary | ICD-10-CM | POA: Insufficient documentation

## 2023-01-04 DIAGNOSIS — Z809 Family history of malignant neoplasm, unspecified: Secondary | ICD-10-CM | POA: Diagnosis not present

## 2023-01-04 DIAGNOSIS — N6011 Diffuse cystic mastopathy of right breast: Secondary | ICD-10-CM | POA: Diagnosis not present

## 2023-01-04 DIAGNOSIS — Z803 Family history of malignant neoplasm of breast: Secondary | ICD-10-CM | POA: Diagnosis not present

## 2023-01-04 DIAGNOSIS — N6021 Fibroadenosis of right breast: Secondary | ICD-10-CM | POA: Diagnosis not present

## 2023-01-04 HISTORY — PX: BREAST LUMPECTOMY WITH RADIOACTIVE SEED LOCALIZATION: SHX6424

## 2023-01-04 HISTORY — PX: BREAST REDUCTION WITH MASTOPEXY: SHX6465

## 2023-01-04 HISTORY — PX: BREAST LUMPECTOMY: SHX2

## 2023-01-04 HISTORY — PX: BREAST LUMPECTOMY WITH RADIOACTIVE SEED AND SENTINEL LYMPH NODE BIOPSY: SHX6550

## 2023-01-04 SURGERY — BREAST LUMPECTOMY WITH RADIOACTIVE SEED AND SENTINEL LYMPH NODE BIOPSY
Anesthesia: General | Site: Breast | Laterality: Right

## 2023-01-04 MED ORDER — TECHNETIUM TC 99M TILMANOCEPT KIT
1.0000 | PACK | Freq: Once | INTRAVENOUS | Status: AC | PRN
  Administered 2023-01-04: 1 via INTRADERMAL

## 2023-01-04 MED ORDER — GABAPENTIN 300 MG PO CAPS
ORAL_CAPSULE | ORAL | Status: AC
  Administered 2023-01-04: 300 mg via ORAL
  Filled 2023-01-04: qty 1

## 2023-01-04 MED ORDER — LIDOCAINE 2% (20 MG/ML) 5 ML SYRINGE
INTRAMUSCULAR | Status: AC
  Filled 2023-01-04: qty 5

## 2023-01-04 MED ORDER — FENTANYL CITRATE (PF) 250 MCG/5ML IJ SOLN
INTRAMUSCULAR | Status: AC
  Filled 2023-01-04: qty 5

## 2023-01-04 MED ORDER — SODIUM CHLORIDE 0.9 % IV SOLN: 250.0000 mL | INTRAVENOUS | Status: DC | PRN

## 2023-01-04 MED ORDER — FENTANYL CITRATE (PF) 100 MCG/2ML IJ SOLN
INTRAMUSCULAR | Status: AC
  Filled 2023-01-04: qty 2

## 2023-01-04 MED ORDER — MIDAZOLAM HCL 2 MG/2ML IJ SOLN: 2.0000 mg | Freq: Once | INTRAMUSCULAR | Status: AC

## 2023-01-04 MED ORDER — PHENYLEPHRINE 80 MCG/ML (10ML) SYRINGE FOR IV PUSH (FOR BLOOD PRESSURE SUPPORT)
PREFILLED_SYRINGE | INTRAVENOUS | Status: AC
  Filled 2023-01-04: qty 10

## 2023-01-04 MED ORDER — ACETAMINOPHEN 325 MG PO TABS: 325.0000 mg | ORAL_TABLET | ORAL | Status: DC | PRN

## 2023-01-04 MED ORDER — 0.9 % SODIUM CHLORIDE (POUR BTL) OPTIME
TOPICAL | Status: DC | PRN
  Administered 2023-01-04: 1000 mL

## 2023-01-04 MED ORDER — FENTANYL CITRATE (PF) 100 MCG/2ML IJ SOLN
25.0000 ug | INTRAMUSCULAR | Status: DC | PRN
  Administered 2023-01-04: 50 ug via INTRAVENOUS

## 2023-01-04 MED ORDER — ONDANSETRON HCL 4 MG/2ML IJ SOLN
INTRAMUSCULAR | Status: AC
  Filled 2023-01-04: qty 2

## 2023-01-04 MED ORDER — CEFAZOLIN SODIUM-DEXTROSE 2-4 GM/100ML-% IV SOLN
2.0000 g | INTRAVENOUS | Status: AC
  Administered 2023-01-04: 2 g via INTRAVENOUS
  Filled 2023-01-04: qty 100

## 2023-01-04 MED ORDER — ACETAMINOPHEN 325 MG PO TABS: 650.0000 mg | ORAL_TABLET | ORAL | Status: DC | PRN

## 2023-01-04 MED ORDER — SODIUM CHLORIDE 0.9% FLUSH: 3.0000 mL | Freq: Two times a day (BID) | INTRAVENOUS | Status: DC

## 2023-01-04 MED ORDER — ORAL CARE MOUTH RINSE: 15.0000 mL | Freq: Once | OROMUCOSAL | Status: AC

## 2023-01-04 MED ORDER — BUPIVACAINE-EPINEPHRINE (PF) 0.25% -1:200000 IJ SOLN
INTRAMUSCULAR | Status: DC | PRN
  Administered 2023-01-04 (×2): 30 mL

## 2023-01-04 MED ORDER — FENTANYL CITRATE (PF) 100 MCG/2ML IJ SOLN
INTRAMUSCULAR | Status: AC
  Administered 2023-01-04: 100 ug via INTRAVENOUS
  Filled 2023-01-04: qty 2

## 2023-01-04 MED ORDER — MIDAZOLAM HCL 2 MG/2ML IJ SOLN
INTRAMUSCULAR | Status: AC
  Filled 2023-01-04: qty 2

## 2023-01-04 MED ORDER — CHLORHEXIDINE GLUCONATE CLOTH 2 % EX PADS: 6.0000 | MEDICATED_PAD | Freq: Once | CUTANEOUS | Status: DC

## 2023-01-04 MED ORDER — OXYCODONE HCL 5 MG PO TABS: 5.0000 mg | ORAL_TABLET | Freq: Once | ORAL | Status: DC | PRN

## 2023-01-04 MED ORDER — BUPIVACAINE-EPINEPHRINE 0.25% -1:200000 IJ SOLN
INTRAMUSCULAR | Status: DC | PRN
  Administered 2023-01-04: 30 mL

## 2023-01-04 MED ORDER — LACTATED RINGERS IV SOLN: INTRAVENOUS | Status: DC

## 2023-01-04 MED ORDER — BUPIVACAINE-EPINEPHRINE (PF) 0.25% -1:200000 IJ SOLN
INTRAMUSCULAR | Status: AC
  Filled 2023-01-04: qty 60

## 2023-01-04 MED ORDER — ACETAMINOPHEN 500 MG PO TABS: 1000.0000 mg | ORAL_TABLET | ORAL | Status: AC

## 2023-01-04 MED ORDER — METHYLENE BLUE (ANTIDOTE) 1 % IV SOLN
INTRAVENOUS | Status: AC
  Filled 2023-01-04: qty 10

## 2023-01-04 MED ORDER — CHLORHEXIDINE GLUCONATE 0.12 % MT SOLN
15.0000 mL | Freq: Once | OROMUCOSAL | Status: AC
  Administered 2023-01-04: 15 mL via OROMUCOSAL
  Filled 2023-01-04: qty 15

## 2023-01-04 MED ORDER — FENTANYL CITRATE (PF) 250 MCG/5ML IJ SOLN
INTRAMUSCULAR | Status: DC | PRN
  Administered 2023-01-04: 50 ug via INTRAVENOUS
  Administered 2023-01-04: 150 ug via INTRAVENOUS
  Administered 2023-01-04: 50 ug via INTRAVENOUS

## 2023-01-04 MED ORDER — MIDAZOLAM HCL 2 MG/2ML IJ SOLN
INTRAMUSCULAR | Status: DC | PRN
  Administered 2023-01-04: 2 mg via INTRAVENOUS

## 2023-01-04 MED ORDER — PROPOFOL 10 MG/ML IV BOLUS
INTRAVENOUS | Status: DC | PRN
Start: 2023-01-04 — End: 2023-01-04
  Administered 2023-01-04: 130 mg via INTRAVENOUS

## 2023-01-04 MED ORDER — CEFAZOLIN SODIUM-DEXTROSE 2-4 GM/100ML-% IV SOLN: 2.0000 g | INTRAVENOUS | Status: DC

## 2023-01-04 MED ORDER — SODIUM CHLORIDE 0.9% FLUSH: 3.0000 mL | INTRAVENOUS | Status: DC | PRN

## 2023-01-04 MED ORDER — ACETAMINOPHEN 500 MG PO TABS
ORAL_TABLET | ORAL | Status: AC
  Administered 2023-01-04: 1000 mg via ORAL
  Filled 2023-01-04: qty 2

## 2023-01-04 MED ORDER — ACETAMINOPHEN 10 MG/ML IV SOLN: 1000.0000 mg | Freq: Once | INTRAVENOUS | Status: DC | PRN

## 2023-01-04 MED ORDER — ACETAMINOPHEN 160 MG/5ML PO SOLN: 325.0000 mg | ORAL | Status: DC | PRN

## 2023-01-04 MED ORDER — OXYCODONE HCL 5 MG/5ML PO SOLN: 5.0000 mg | Freq: Once | ORAL | Status: DC | PRN

## 2023-01-04 MED ORDER — ROCURONIUM BROMIDE 10 MG/ML (PF) SYRINGE
PREFILLED_SYRINGE | INTRAVENOUS | Status: AC
  Filled 2023-01-04: qty 10

## 2023-01-04 MED ORDER — PROMETHAZINE HCL 25 MG/ML IJ SOLN: 6.2500 mg | INTRAMUSCULAR | Status: DC | PRN

## 2023-01-04 MED ORDER — ROCURONIUM BROMIDE 10 MG/ML (PF) SYRINGE
PREFILLED_SYRINGE | INTRAVENOUS | Status: DC | PRN
  Administered 2023-01-04: 60 mg via INTRAVENOUS

## 2023-01-04 MED ORDER — OXYCODONE HCL 5 MG PO TABS: 5.0000 mg | ORAL_TABLET | Freq: Four times a day (QID) | ORAL | 0 refills | Status: DC | PRN

## 2023-01-04 MED ORDER — LIDOCAINE 2% (20 MG/ML) 5 ML SYRINGE
INTRAMUSCULAR | Status: DC | PRN
  Administered 2023-01-04: 60 mg via INTRAVENOUS

## 2023-01-04 MED ORDER — FENTANYL CITRATE (PF) 100 MCG/2ML IJ SOLN: 100.0000 ug | Freq: Once | INTRAMUSCULAR | Status: AC

## 2023-01-04 MED ORDER — MIDAZOLAM HCL 2 MG/2ML IJ SOLN
INTRAMUSCULAR | Status: AC
  Administered 2023-01-04: 2 mg via INTRAVENOUS
  Filled 2023-01-04: qty 2

## 2023-01-04 MED ORDER — ACETAMINOPHEN 650 MG RE SUPP: 650.0000 mg | RECTAL | Status: DC | PRN

## 2023-01-04 MED ORDER — PHENYLEPHRINE 80 MCG/ML (10ML) SYRINGE FOR IV PUSH (FOR BLOOD PRESSURE SUPPORT)
PREFILLED_SYRINGE | INTRAVENOUS | Status: DC | PRN
  Administered 2023-01-04: 160 ug via INTRAVENOUS
  Administered 2023-01-04: 80 ug via INTRAVENOUS
  Administered 2023-01-04: 160 ug via INTRAVENOUS
  Administered 2023-01-04: 80 ug via INTRAVENOUS
  Administered 2023-01-04 (×2): 160 ug via INTRAVENOUS

## 2023-01-04 MED ORDER — ONDANSETRON HCL 4 MG/2ML IJ SOLN
INTRAMUSCULAR | Status: DC | PRN
  Administered 2023-01-04: 4 mg via INTRAVENOUS

## 2023-01-04 MED ORDER — PROPOFOL 10 MG/ML IV BOLUS
INTRAVENOUS | Status: AC
  Filled 2023-01-04: qty 20

## 2023-01-04 MED ORDER — OXYCODONE HCL 5 MG PO TABS: 5.0000 mg | ORAL_TABLET | ORAL | Status: DC | PRN

## 2023-01-04 MED ORDER — DEXAMETHASONE SODIUM PHOSPHATE 10 MG/ML IJ SOLN
INTRAMUSCULAR | Status: DC | PRN
  Administered 2023-01-04: 10 mg via INTRAVENOUS

## 2023-01-04 MED ORDER — DEXAMETHASONE SODIUM PHOSPHATE 10 MG/ML IJ SOLN
INTRAMUSCULAR | Status: AC
  Filled 2023-01-04: qty 1

## 2023-01-04 MED ORDER — SUGAMMADEX SODIUM 200 MG/2ML IV SOLN
INTRAVENOUS | Status: DC | PRN
  Administered 2023-01-04: 200 mg via INTRAVENOUS

## 2023-01-04 MED ORDER — AMISULPRIDE (ANTIEMETIC) 5 MG/2ML IV SOLN: 10.0000 mg | Freq: Once | INTRAVENOUS | Status: DC | PRN

## 2023-01-04 MED ORDER — GABAPENTIN 300 MG PO CAPS: 300.0000 mg | ORAL_CAPSULE | ORAL | Status: AC

## 2023-01-04 SURGICAL SUPPLY — 52 items
ADH SKN CLS APL DERMABOND .7 (GAUZE/BANDAGES/DRESSINGS) ×6
APL PRP STRL LF DISP 70% ISPRP (MISCELLANEOUS) ×6
APPLIER CLIP 9.375 MED OPEN (MISCELLANEOUS) ×6
APR CLP MED 9.3 20 MLT OPN (MISCELLANEOUS) ×6
BAG COUNTER SPONGE SURGICOUNT (BAG) ×3 IMPLANT
BAG SPNG CNTER NS LX DISP (BAG) ×3
BINDER BREAST XLRG (GAUZE/BANDAGES/DRESSINGS) IMPLANT
BLADE SURG 10 STRL SS (BLADE) ×3 IMPLANT
BNDG GAUZE DERMACEA FLUFF 4 (GAUZE/BANDAGES/DRESSINGS) ×6 IMPLANT
BNDG GZE DERMACEA 4 6PLY (GAUZE/BANDAGES/DRESSINGS) ×6
CANISTER SUCT 1200ML W/VALVE (MISCELLANEOUS) ×3 IMPLANT
CANISTER SUCT 3000ML PPV (MISCELLANEOUS) ×3 IMPLANT
CHLORAPREP W/TINT 26 (MISCELLANEOUS) ×6 IMPLANT
CLIP APPLIE 9.375 MED OPEN (MISCELLANEOUS) ×3 IMPLANT
CNTNR URN SCR LID CUP LEK RST (MISCELLANEOUS) ×3 IMPLANT
CONT SPEC 4OZ STRL OR WHT (MISCELLANEOUS) ×12
COVER MAYO STAND STRL (DRAPES) ×3 IMPLANT
COVER PROBE W GEL 5X96 (DRAPES) ×6 IMPLANT
COVER SURGICAL LIGHT HANDLE (MISCELLANEOUS) ×3 IMPLANT
DERMABOND ADVANCED .7 DNX12 (GAUZE/BANDAGES/DRESSINGS) ×6 IMPLANT
DEVICE DUBIN SPECIMEN MAMMOGRA (MISCELLANEOUS) ×3 IMPLANT
DRAPE CHEST BREAST 15X10 FENES (DRAPES) ×3 IMPLANT
DRESSING MEPILEX FLEX 4X4 (GAUZE/BANDAGES/DRESSINGS) IMPLANT
DRSG MEPILEX FLEX 4X4 (GAUZE/BANDAGES/DRESSINGS) ×6
ELECT COATED BLADE 2.86 ST (ELECTRODE) ×6 IMPLANT
ELECT REM PT RETURN 9FT ADLT (ELECTROSURGICAL) ×6
ELECTRODE REM PT RTRN 9FT ADLT (ELECTROSURGICAL) ×6 IMPLANT
GAUZE PAD ABD 8X10 STRL (GAUZE/BANDAGES/DRESSINGS) ×6 IMPLANT
GLOVE BIO SURGEON STRL SZ 6 (GLOVE) ×3 IMPLANT
GLOVE BIO SURGEON STRL SZ7.5 (GLOVE) ×6 IMPLANT
GOWN STRL REUS W/ TWL LRG LVL3 (GOWN DISPOSABLE) ×12 IMPLANT
GOWN STRL REUS W/TWL LRG LVL3 (GOWN DISPOSABLE) ×18
KIT BASIN OR (CUSTOM PROCEDURE TRAY) ×3 IMPLANT
KIT MARKER MARGIN INK (KITS) ×3 IMPLANT
MARKER SKIN DUAL TIP RULER LAB (MISCELLANEOUS) ×3 IMPLANT
NDL HYPO 25GX1X1/2 BEV (NEEDLE) ×3 IMPLANT
NEEDLE HYPO 25GX1X1/2 BEV (NEEDLE) ×3 IMPLANT
NS IRRIG 1000ML POUR BTL (IV SOLUTION) ×3 IMPLANT
PACK GENERAL/GYN (CUSTOM PROCEDURE TRAY) ×3 IMPLANT
PENCIL BUTTON HOLSTER BLD 10FT (ELECTRODE) ×3 IMPLANT
SLEEVE SCD COMPRESS KNEE MED (STOCKING) ×3 IMPLANT
SPONGE T-LAP 18X18 ~~LOC~~+RFID (SPONGE) IMPLANT
STRIP CLOSURE SKIN 1/2X4 (GAUZE/BANDAGES/DRESSINGS) IMPLANT
SUT MNCRL AB 3-0 PS2 27 (SUTURE) IMPLANT
SUT MNCRL AB 4-0 PS2 18 (SUTURE) ×9 IMPLANT
SUT PDS AB 3-0 SH 27 (SUTURE) IMPLANT
SUT VIC AB 3-0 SH 18 (SUTURE) ×3 IMPLANT
SYR CONTROL 10ML LL (SYRINGE) ×6 IMPLANT
TAPE MEASURE VINYL STERILE (MISCELLANEOUS) ×3 IMPLANT
TOWEL GREEN STERILE FF (TOWEL DISPOSABLE) ×3 IMPLANT
TUBE CONNECTING 20X1/4 (TUBING) ×3 IMPLANT
UNDERPAD 30X36 HEAVY ABSORB (UNDERPADS AND DIAPERS) ×6 IMPLANT

## 2023-01-04 NOTE — Anesthesia Procedure Notes (Signed)
Anesthesia Regional Block: Pectoralis block   Pre-Anesthetic Checklist: , timeout performed,  Correct Patient, Correct Site, Correct Laterality,  Correct Procedure, Correct Position, site marked,  Risks and benefits discussed,  Surgical consent,  Pre-op evaluation,  At surgeon's request and post-op pain management  Laterality: Left  Prep: chloraprep       Needles:  Injection technique: Single-shot  Needle Type: Echogenic Stimulator Needle     Needle Length: 9cm  Needle Gauge: 21     Additional Needles:   Procedures:,,,, ultrasound used (permanent image in chart),,    Narrative:  Start time: 01/04/2023 11:00 AM End time: 01/04/2023 11:05 AM Injection made incrementally with aspirations every 5 mL.  Performed by: Personally  Anesthesiologist: Shelton Silvas, MD  Additional Notes: Discussed risks and benefits of the nerve block in detail, including but not limited vascular injury, permanent nerve damage and infection.   Patient tolerated the procedure well. Local anesthetic introduced in an incremental fashion under minimal resistance after negative aspirations. No paresthesias were elicited. After completion of the procedure, no acute issues were identified and patient continued to be monitored by RN.

## 2023-01-04 NOTE — H&P (Signed)
REFERRING PHYSICIAN: Aviva Signs ZOXW*  PROVIDER: Lindell Noe, MD  MRN: [id number] DOB: Apr 30, 1981 Subjective   Chief Complaint: Follow-up ( RE-CHECK - )   History of Present Illness: Nicole Maddox is a 42 y.o. female who is seen today as an office consultation for evaluation of Follow-up ( RE-CHECK - ) .   We are asked to see the patient in consultation by Dr. Seymour Bars to evaluate her for a new left breast cancer. The patient is a 42 year old black female who recently felt a mass in the upper outer quadrant of the left breast. She contacted her doctor and was evaluated by mammogram and ultrasound. She was found to have a 2.7 cm mass in the upper outer quadrant of the left breast with 2 abnormal looking lymph nodes. The lymph nodes were biopsied and came back benign. 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 The cancer was weakly ER positive so it is essentially a triple negative with a Ki-67 of 95%. Since her last visit she had 1 more biopsy on the left which was benign and 2 biopsies on the right which were benign. The larger area on the right is still recommended for excision since it came back as a 1.3 cm papilloma. The 2 other areas on the right were biopsied and came back benign. She has 2 more weeks of chemotherapy left. She recently felt a new mass in the upper outer quadrant of the left breast near the axilla  Review of Systems: A complete review of systems was obtained from the patient. I have reviewed this information and discussed as appropriate with the patient. See HPI as well for other ROS.  ROS   Medical History: Past Medical History:  Diagnosis Date  GERD (gastroesophageal reflux disease)  Thyroid disease   Patient Active Problem List  Diagnosis  Malignant neoplasm of upper-outer quadrant of left female breast (CMS/HHS-HCC)   Past Surgical History:  Procedure Laterality Date  keloid    No Known Allergies  Current  Outpatient Medications on File Prior to Visit  Medication Sig Dispense Refill  ASHWAGANDHA EXTRACT ORAL Take by mouth  etodolac (LODINE) 500 MG tablet Take 500 mg by mouth 2 (two) times daily  folic acid (FOLVITE) 1 MG tablet Take 1,000 mcg by mouth once daily  fructooligosaccharides (PREBIOTIC FIBER, FOS, ORAL) Take by mouth  ketoconazole (NIZORAL) 2 % shampoo as directed  L-TYROSINE ORAL Take 500 mg by mouth  methIMAzole (TAPAZOLE) 5 MG tablet Take 10 mg by mouth every morning  MYFEMBREE 40-1-0.5 mg Tab Take 1 tablet by mouth once daily  omeprazole (PRILOSEC) 40 MG DR capsule Take 40 mg by mouth once daily  SELENIUM ORAL Take 300 mg by mouth  sulfamethoxazole-trimethoprim (BACTRIM DS) 800-160 mg tablet TAKE 1 TABLET BY MOUTH TWICE A DAY FOR 3 DAYS  zinc acetate 50 mg (zinc) Cap Take by mouth   No current facility-administered medications on file prior to visit.   Family History  Problem Relation Age of Onset  Diabetes Mother  High blood pressure (Hypertension) Mother  Stroke Other  Skin cancer Other  Breast cancer Other    Social History   Tobacco Use  Smoking Status Never  Smokeless Tobacco Never    Social History   Socioeconomic History  Marital status: Single  Tobacco Use  Smoking status: Never  Smokeless tobacco: Never   Social Determinants of Health   Financial Resource Strain: Low Risk (07/04/2022)  Received from [facility],  [place]  Overall Financial Resource Strain (CARDIA)  Difficulty of Paying Living Expenses: Not very hard  Food Insecurity: No Food Insecurity (06/28/2022)  Received from [facility],   Hunger Vital Sign  Worried About Running Out of Food in the Last Year: Never true  Ran Out of Food in the Last Year: Never true  Transportation Needs: No Transportation Needs (06/28/2022)  Received from [facility],   PRAPARE - Transportation  Lack of Transportation (Medical): No  Lack of Transportation (Non-Medical): No    Objective:   There were no vitals filed for this visit.  There is no height or weight on file to calculate BMI.  Physical Exam Vitals reviewed.  Constitutional:  General: She is not in acute distress. Appearance: Normal appearance.  HENT:  Head: Normocephalic and atraumatic.  Right Ear: External ear normal.  Left Ear: External ear normal.  Nose: Nose normal.  Mouth/Throat:  Mouth: Mucous membranes are moist.  Pharynx: Oropharynx is clear.  Eyes:  General: No scleral icterus. Extraocular Movements: Extraocular movements intact.  Conjunctiva/sclera: Conjunctivae normal.  Pupils: Pupils are equal, round, and reactive to light.  Cardiovascular:  Rate and Rhythm: Normal rate and regular rhythm.  Pulses: Normal pulses.  Heart sounds: Normal heart sounds.  Pulmonary:  Effort: Pulmonary effort is normal. No respiratory distress.  Breath sounds: Normal breath sounds.  Abdominal:  General: Bowel sounds are normal.  Palpations: Abdomen is soft.  Tenderness: There is no abdominal tenderness.  Musculoskeletal:  General: No swelling, tenderness or deformity. Normal range of motion.  Cervical back: Normal range of motion and neck supple.  Skin: General: Skin is warm and dry.  Coloration: Skin is not jaundiced.  Neurological:  General: No focal deficit present.  Mental Status: She is alert and oriented to person, place, and time.  Psychiatric:  Mood and Affect: Mood normal.  Behavior: Behavior normal.     Breast: The mass in the left breast is no longer palpable. There is no palpable mass in the right breast. There is no palpable axillary, supraclavicular, or cervical lymphadenopathy. There is a new 2 cm palpable mobile mass in the upper outer quadrant of the left breast near the axilla  Labs, Imaging and Diagnostic Testing:  Assessment and Plan:   Diagnoses and all orders for this visit:  Malignant neoplasm of upper-outer quadrant of left female breast, unspecified  estrogen receptor status (CMS/HHS-HCC) - Mammo diagnostic digital left - US breast limited left; Future - MRI breast bilateral with and without contrast; Future - [facility] Case Posting Request; Future    The patient appears to have a 2.7 cm cancer in the upper outer quadrant of the left breast with clinically negative nodes. She has started neoadjuvant chemotherapy. Her port is functioning well. Her genetic testing was negative. She has elected for breast conservation which I feel is very reasonable. She will be a good candidate for sentinel node mapping as well. She will also need a lumpectomy on the right for a 1.3 cm papilloma. I have discussed with her in detail the risks and benefits of the operation as well as some of the technical aspects including use of a radioactive seed for localization and she understands and wishes to proceed. She will finish out the next 2 weeks of chemotherapy. We will evaluate the new palpable mass in the left breast with mammogram, ultrasound, and MRI. We will call her with the results of these tests and continue to move forward with surgical scheduling No follow-ups on  file.

## 2023-01-04 NOTE — Transfer of Care (Signed)
Immediate Anesthesia Transfer of Care Note  Patient: Nicole Maddox  Procedure(s) Performed: LEFT BREAST LUMPECTOMY WITH RADIOACTIVE SEED AND SENTINEL LYMPH NODE BIOPSY (Left: Breast) RIGHT BREAST LUMPECTOMY WITH RADIOACTIVE SEED LOCALIZATION (Right: Breast) BREAST REDUCTION WITH MASTOPEXY (Bilateral: Breast)  Patient Location: PACU  Anesthesia Type:General  Level of Consciousness: drowsy and patient cooperative  Airway & Oxygen Therapy: Patient Spontanous Breathing and Patient connected to face mask oxygen  Post-op Assessment: Report given to RN and Post -op Vital signs reviewed and stable  Post vital signs: Reviewed and stable  Last Vitals:  Vitals Value Taken Time  BP 128/70 01/04/23 1400  Temp    Pulse 88 01/04/23 1401  Resp 19 01/04/23 1401  SpO2 100 % 01/04/23 1401  Vitals shown include unfiled device data.  Last Pain:  Vitals:   01/04/23 0940  TempSrc:   PainSc: 0-No pain         Complications: No notable events documented.

## 2023-01-04 NOTE — Anesthesia Procedure Notes (Signed)
Procedure Name: Intubation Date/Time: 01/04/2023 11:47 AM  Performed by: Orlin Hilding, CRNAPre-anesthesia Checklist: Patient identified, Emergency Drugs available, Suction available, Patient being monitored and Timeout performed Patient Re-evaluated:Patient Re-evaluated prior to induction Oxygen Delivery Method: Circle system utilized Preoxygenation: Pre-oxygenation with 100% oxygen Induction Type: IV induction Ventilation: Mask ventilation without difficulty and Oral airway inserted - appropriate to patient size Laryngoscope Size: Mac and 3 Grade View: Grade I Tube type: Oral Tube size: 7.0 mm Number of attempts: 1 Placement Confirmation: ETT inserted through vocal cords under direct vision, positive ETCO2 and breath sounds checked- equal and bilateral Secured at: 24 cm Tube secured with: Tape Dental Injury: Teeth and Oropharynx as per pre-operative assessment

## 2023-01-04 NOTE — Interval H&P Note (Signed)
History and Physical Interval Note:  01/04/2023 11:48 AM  Nicole Maddox  has presented today for surgery, with the diagnosis of LEFT BREAST CANCER, RIGHT BREAST PAPILLOMA.  The various methods of treatment have been discussed with the patient and family. After consideration of risks, benefits and other options for treatment, the patient has consented to  Procedure(s) with comments: LEFT BREAST LUMPECTOMY WITH RADIOACTIVE SEED AND SENTINEL LYMPH NODE BIOPSY (Left) - PEC BLOCK RIGHT BREAST LUMPECTOMY WITH RADIOACTIVE SEED LOCALIZATION (Right) BREAST REDUCTION WITH MASTOPEXY (Bilateral) as a surgical intervention.  The patient's history has been reviewed, patient examined, no change in status, stable for surgery.  I have reviewed the patient's chart and labs.  Questions were answered to the patient's satisfaction.     Alena Bills Chadwin Fury

## 2023-01-04 NOTE — Op Note (Signed)
01/04/2023  1:13 PM  PATIENT:  Nicole Maddox  42 y.o. female  PRE-OPERATIVE DIAGNOSIS:  LEFT BREAST CANCER, RIGHT BREAST PAPILLOMA  POST-OPERATIVE DIAGNOSIS:  LEFT BREAST CANCER, RIGHT BREAST PAPILLOMA  PROCEDURE:  Procedure(s) with comments: LEFT BREAST LUMPECTOMY WITH RADIOACTIVE SEED LOCALIZATION AND DEEP LEFT AXILLARY SENTINEL LYMPH NODE BIOPSY (Left) - PEC BLOCK RIGHT BREAST LUMPECTOMY WITH RADIOACTIVE SEED LOCALIZATION (Right)  SURGEON:  Surgeons and Role: Panel 1:    Griselda Miner, MD - Primary  PHYSICIAN ASSISTANT:   ASSISTANTS: none   ANESTHESIA:   local and general  EBL:  Minimal   BLOOD ADMINISTERED:none  DRAINS: none   LOCAL MEDICATIONS USED:  MARCAINE     SPECIMEN:  Source of Specimen:  right breast tissue, left breast tissue with additional medial and superior margins, sentinel nodes x 2  DISPOSITION OF SPECIMEN:  PATHOLOGY  COUNTS:  YES  TOURNIQUET:  * No tourniquets in log *  DICTATION: .Dragon Dictation  After informed consent was obtained the patient was brought to the operating room and placed in the supine position on the operating table.  After adequate induction of general anesthesia the patient's bilateral chest, breast, and axillary areas were prepped with ChloraPrep, allowed to dry, and draped in usual sterile manner.  An appropriate timeout was performed.  Previously an I-125 seed was placed in the upper outer central right breast to mark an area of papilloma and a second seed was placed in the far upper outer quadrant of the left breast to mark an area of invasive breast cancer.  Also earlier in the day the patient underwent injection of 1 mCi of technetium sulfur colloid in the subareolar position on the left.  Attention was first turned to the right breast.  Dr. Ulice Bold had marked the patient for mastopexies.  I made a small incision in the upper outer central right breast along her mastopexy marking with a 15 blade knife.  The neoprobe was  set to I-125 in the area of radioactivity was readily identified.  The incision was carried through the skin and subcutaneous tissue sharply with the electrocautery.  Dissection was then carried towards the radioactive seed under the direction of the neoprobe.  Once I more closely approach the radioactive seed I then removed a circular portion of breast tissue sharply with the electrocautery around the radioactive seed while checking the area of radioactivity frequently.  Once the specimen was removed it was oriented with the appropriate paint colors.  A specimen radiograph was obtained that showed the clip and seed to be within the specimen.  The specimen was then sent to pathology for further evaluation.  Hemostasis was achieved using the Bovie electrocautery.  This wound was then closed by Dr. Ulice Bold as part of her mastopexy.  Attention was then turned to the left breast.  The area of radioactivity was far in the upper outer quadrant of the left breast near the axilla.  A small transversely oriented incision was made with a 15 blade knife overlying the area of radioactivity in the lower axilla.  The incision was carried through the skin and subcutaneous tissue sharply with the electrocautery.  Dissection was then carried widely around the radioactive seed while checking the area of radioactivity frequently.  Once the specimen was removed it was oriented with the appropriate paint colors.  A specimen radiograph was obtained that showed the clip and seed to be within the specimen.  I elected to take an additional superior and medial margin  and these were marked appropriately.  All of the breast tissue was then sent to pathology for further evaluation.  The neoprobe was then set to technetium in the area of radioactivity was readily identified in the deep left axillary space.  The deep left axillary space was then entered through the lumpectomy incision.  Dissection was carried towards the radioactivity under  the direction of the neoprobe.  I was able to identify 2 hot lymph nodes.  Each of these was excised sharply with the electrocautery and the surrounding small vessels and lymphatics were controlled with clips.  Ex vivo counts on these nodes were both around 5000.  No other hot or palpable nodes were identified in the left axilla.  Hemostasis was achieved using the Bovie electrocautery.  The axilla and lumpectomy cavities were then closed with interrupted 3-0 Vicryl stitches.  The skin was then closed with a running 4-0 Monocryl subcuticular stitch.  At this point the patient was tolerating the procedure well.  All needle sponge and instrument counts were correct.  The operation was then turned over to Dr. Ulice Bold for the mastopexies.  Her portion will be dictated separately.  PLAN OF CARE: Discharge to home after PACU  PATIENT DISPOSITION:  PACU - hemodynamically stable.   Delay start of Pharmacological VTE agent (>24hrs) due to surgical blood loss or risk of bleeding: not applicable

## 2023-01-04 NOTE — Op Note (Signed)
Op note:    DATE OF PROCEDURE: 01/04/2023  LOCATION: [facility]  SURGEON: Foster Simpson, DO  ASSISTANT: Keenan Bachelor, PA  PREOPERATIVE DIAGNOSIS 1. Left breast cancer 2. Right breast disease.  POSTOPERATIVE DIAGNOSIS Same as preoperative diagnosis  PROCEDURES 1. Bilateral breast reduction.  Right reduction 95 g, Left reduction 115 g  COMPLICATIONS: None.  DRAINS: none  INDICATIONS FOR PROCEDURE Nicole Maddox is a 42 y.o. year-old female born on 1980-12-04,with a history of symptomatic macromastia with concominant back pain, neck pain, shoulder grooving from her bra.   MRN: [id number]  CONSENT Informed consent was obtained directly from the patient. The risks, benefits and alternatives were fully discussed. Specific risks including but not limited to bleeding, infection, hematoma, seroma, scarring, pain, nipple necrosis, asymmetry, poor cosmetic results, and need for further surgery were discussed. The patient's questions were answered.  DESCRIPTION OF PROCEDURE  Patient was brought into the operating room and rested on the operating room table in the supine position.  SCDs were placed and appropriate padding was performed.  Antibiotics were given. The patient underwent general anesthesia and the chest was prepped and draped in a sterile fashion.  A timeout was performed and all information was confirmed to be correct by those in the room. The patient was taken to the OR with general surgery and their portion of the case was done and is dictated separately. The patient was then rendered to the plastic surgery service.  Right side: Preoperative markings were confirmed.  Incision lines were injected with local containing epinephrine.  After waiting for vasoconstriction, the marked lines were incised with a #15 blade.  A Wise-pattern superomedial breast reduction was performed by de-epithelializing the pedicle, using bovie to create the superomedial pedicle,  and removing breast tissue from the lateral and inferior portions of the breast.  The superior aspect was removed as part of the specimen. Care was taken to not undermine the breast pedicle. Hemostasis was achieved.  The nipple was gently rotated into position and the soft tissue closed with 4-0 Monocryl.   The pocket was irrigated and hemostasis confirmed.  The deep tissues were approximated with 3-0 PDS sutures.  The skin was closed with deep dermal 3-0 Monocryl and subcuticular 4-0 Monocryl sutures.  The nipple and skin flaps had good capillary refill at the end of the procedure.    Left side: Preoperative markings were confirmed.  Incision lines were injected with local containing epinephrine.  After waiting for vasoconstriction, the marked lines were incised with a #15 blade.  A Wise-pattern superomedial breast reduction was performed by de-epithelializing the pedicle, using bovie to create the superomedial pedicle, and removing breast tissue from the superior, lateral, and inferior portions of the breast.  Care was taken to not undermine the breast pedicle. Hemostasis was achieved.  The nipple was gently rotated into position and the soft tissue was closed with 4-0 Monocryl.  The patient was sat upright and size and shape symmetry was confirmed.  The pocket was irrigated and hemostasis confirmed.  The deep tissues were approximated with 3-0 PDS sutures. The skin was closed with deep dermal 3-0 Monocryl and subcuticular 4-0 Monocryl sutures.  Dermabond was applied.  A breast binder and ABDs were placed.  The nipple and skin flaps had good capillary refill at the end of the procedure.  The patient tolerated the procedure well. The patient was allowed to wake from anesthesia and taken to the recovery room in satisfactory condition.  The advanced practice practitioner (  APP) assisted throughout the case.  The APP was essential in retraction and counter traction when needed to make the case progress smoothly.   This retraction and assistance made it possible to see the tissue plans for the procedure.  The assistance was needed for blood control, tissue re-approximation and assisted with closure of the incision site.

## 2023-01-04 NOTE — Anesthesia Procedure Notes (Signed)
Anesthesia Regional Block: Pectoralis block   Pre-Anesthetic Checklist: , timeout performed,  Correct Patient, Correct Site, Correct Laterality,  Correct Procedure, Correct Position, site marked,  Risks and benefits discussed,  Surgical consent,  Pre-op evaluation,  At surgeon's request and post-op pain management  Laterality: Right  Prep: chloraprep       Needles:  Injection technique: Single-shot  Needle Type: Echogenic Stimulator Needle     Needle Length: 9cm  Needle Gauge: 21     Additional Needles:   Procedures:,,,, ultrasound used (permanent image in chart),,    Narrative:  Start time: 01/04/2023 11:05 AM End time: 01/04/2023 11:10 AM Injection made incrementally with aspirations every 5 mL.  Performed by: Personally  Anesthesiologist: Shelton Silvas, MD  Additional Notes: Discussed risks and benefits of the nerve block in detail, including but not limited vascular injury, permanent nerve damage and infection.   Patient tolerated the procedure well. Local anesthetic introduced in an incremental fashion under minimal resistance after negative aspirations. No paresthesias were elicited. After completion of the procedure, no acute issues were identified and patient continued to be monitored by RN.

## 2023-01-04 NOTE — Interval H&P Note (Addendum)
History and Physical Interval Note:  01/04/2023 10:34 AM  Nicole Maddox  has presented today for surgery, with the diagnosis of LEFT BREAST CANCER, RIGHT BREAST PAPILLOMA.  The various methods of treatment have been discussed with the patient and family. After consideration of risks, benefits and other options for treatment, the patient has consented to  Procedure(s) with comments: LEFT BREAST LUMPECTOMY WITH RADIOACTIVE SEED AND SENTINEL LYMPH NODE BIOPSY (Left) - PEC BLOCK RIGHT BREAST LUMPECTOMY WITH RADIOACTIVE SEED LOCALIZATION (Right)  as a surgical intervention.  The patient's history has been reviewed, patient examined, no change in status, stable for surgery.  I have reviewed the patient's chart and labs.  Questions were answered to the patient's satisfaction.     Chevis Pretty III

## 2023-01-04 NOTE — Discharge Instructions (Signed)

## 2023-01-05 ENCOUNTER — Ambulatory Visit (HOSPITAL_COMMUNITY): Payer: Managed Care, Other (non HMO)

## 2023-01-05 ENCOUNTER — Ambulatory Visit (INDEPENDENT_AMBULATORY_CARE_PROVIDER_SITE_OTHER): Payer: Managed Care, Other (non HMO) | Admitting: Physician Assistant

## 2023-01-05 ENCOUNTER — Encounter (HOSPITAL_COMMUNITY): Payer: Self-pay | Admitting: General Surgery

## 2023-01-05 DIAGNOSIS — Z9889 Other specified postprocedural states: Secondary | ICD-10-CM

## 2023-01-05 NOTE — Anesthesia Postprocedure Evaluation (Signed)
Anesthesia Post Note  Patient: Nicole Maddox  Procedure(s) Performed: LEFT BREAST LUMPECTOMY WITH RADIOACTIVE SEED AND SENTINEL LYMPH NODE BIOPSY (Left: Breast) RIGHT BREAST LUMPECTOMY WITH RADIOACTIVE SEED LOCALIZATION (Right: Breast) BREAST REDUCTION WITH MASTOPEXY (Bilateral: Breast)     Patient location during evaluation: PACU Anesthesia Type: General Level of consciousness: awake and alert Pain management: pain level controlled Vital Signs Assessment: post-procedure vital signs reviewed and stable Respiratory status: spontaneous breathing, nonlabored ventilation, respiratory function stable and patient connected to nasal cannula oxygen Cardiovascular status: blood pressure returned to baseline and stable Postop Assessment: no apparent nausea or vomiting Anesthetic complications: no   No notable events documented.  Last Vitals:  Vitals:   01/04/23 1545 01/04/23 1600  BP: 126/83 124/85  Pulse: 97 89  Resp: 14 16  Temp:  36.9 C  SpO2: 95% 97%                Shelton Silvas

## 2023-01-05 NOTE — Progress Notes (Signed)
Patient is a pleasant 42 year old female with PMH of left-sided breast cancer s/p bilateral lumpectomy with left-sided sentinel lymph node biopsy and bilateral oncoplastic reduction for symmetry performed 01/04/2023 by Dr. Ulice Bold and Dr. Carolynne Edouard who joins via telephone for postoperative day 1 check-in.  Today, patient is okay.  She states that she feels sore across the chest as well as her throat.  She also reports that her ankle was mildly sore, but denies any swelling.  She understands the importance of getting up and ambulating to mitigate the risk of DVT.  She states that she has only eaten grapes and watermelon.  Encouraged her to expand her diet.  Increase oral hydration.  She has been voiding, but no BM yet.  She states that she has been alternating between Tylenol and oxycodone for pain control.  Her mother is at home assisting with her care.  She did feel mildly warm earlier, but did not check her temperature.  Recommend continued compressive garments and activity modifications.  Check temperature should she feel as though she may be developing a fever, but even a low-grade fever in the first 72 hours will be particularly concerning given that it would likely be benign likely response to surgery.  She currently has follow-up scheduled for next week.  No drains in place.  Can begin showering on Sunday.

## 2023-01-09 ENCOUNTER — Encounter: Payer: Self-pay | Admitting: *Deleted

## 2023-01-11 ENCOUNTER — Encounter: Payer: Self-pay | Admitting: *Deleted

## 2023-01-12 ENCOUNTER — Ambulatory Visit (INDEPENDENT_AMBULATORY_CARE_PROVIDER_SITE_OTHER): Payer: Managed Care, Other (non HMO) | Admitting: Plastic Surgery

## 2023-01-12 VITALS — BP 111/75 | HR 74

## 2023-01-12 DIAGNOSIS — C50412 Malignant neoplasm of upper-outer quadrant of left female breast: Secondary | ICD-10-CM

## 2023-01-12 DIAGNOSIS — Z170 Estrogen receptor positive status [ER+]: Secondary | ICD-10-CM

## 2023-01-12 NOTE — Progress Notes (Signed)
The patient is a 42 year old female here for follow-up after undergoing bilateral mastopexy/reduction for oncologic reasons.  She is very pleased with her results.  She thought she might be flat after the surgery so she is quite pleased with her perky position.  No sign of infection or hematoma.  Pictures were obtained of the patient and placed in the chart with the patient's or guardian's permission.

## 2023-01-23 ENCOUNTER — Ambulatory Visit (INDEPENDENT_AMBULATORY_CARE_PROVIDER_SITE_OTHER): Payer: Managed Care, Other (non HMO) | Admitting: Physician Assistant

## 2023-01-23 ENCOUNTER — Encounter: Payer: Self-pay | Admitting: Physician Assistant

## 2023-01-23 VITALS — BP 122/80 | HR 68 | Ht 64.0 in | Wt 180.0 lb

## 2023-01-23 DIAGNOSIS — Z9889 Other specified postprocedural states: Secondary | ICD-10-CM

## 2023-01-23 NOTE — Progress Notes (Signed)
Patient is a pleasant 42 year old female with PMH of left-sided breast cancer s/p bilateral lumpectomy with left-sided sentinel lymph node biopsy and bilateral oncoplastic reduction for symmetry performed 01/04/2023 by Dr. Ulice Bold and Dr. Carolynne Edouard who presents to clinic for postoperative follow-up.  She was last seen here in clinic on 01/12/2023.  At that time, she was quite pleased with the results.  There was no evidence concerning for infection or hematoma.  Pictures were obtained and placed in chart.    Today, patient is doing well.  She states that she has been wearing compressive garments daily and adhering to activity restrictions.  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  She wants to ensure that she is doing everything appropriately and not doing anything to complicate her recovery.  She denies any leg swelling, chest pain, difficulty breathing, or fevers.  On exam, breasts have excellent shape and symmetry.  NAC's are healthy and viable.  She denies sensation, but good capillary refill.  Steri-Strips remain firmly intact, will leave in place as they are not bothersome.  Breasts are soft throughout.  Recommend continued activity modifications and compressive garments.  Exam today is benign.  Follow-up as scheduled.  She will call the clinic should she have questions or concerns in interim.  Will remove Steri-Strips and obtain photos at next appointment.

## 2023-01-26 ENCOUNTER — Telehealth: Payer: Self-pay | Admitting: Radiation Oncology

## 2023-01-26 NOTE — Telephone Encounter (Signed)
Pt called asking to cx FUN/SIM with Dr. Mitzi Hansen. Pt states since sx she has been in [place] being cared for by her family. Since she will be staying there for an extensive time, she has decided to seek XRT closer to family's residence. Pt was advised to call if she needed anything in the future and that referral would be closed at this time.

## 2023-01-31 ENCOUNTER — Inpatient Hospital Stay: Payer: Managed Care, Other (non HMO) | Attending: Hematology and Oncology | Admitting: Hematology and Oncology

## 2023-01-31 VITALS — BP 113/70 | HR 92 | Temp 98.0°F | Resp 18 | Ht 64.0 in | Wt 190.0 lb

## 2023-01-31 DIAGNOSIS — Z803 Family history of malignant neoplasm of breast: Secondary | ICD-10-CM | POA: Diagnosis not present

## 2023-01-31 DIAGNOSIS — Z170 Estrogen receptor positive status [ER+]: Secondary | ICD-10-CM | POA: Diagnosis not present

## 2023-01-31 DIAGNOSIS — Z808 Family history of malignant neoplasm of other organs or systems: Secondary | ICD-10-CM | POA: Insufficient documentation

## 2023-01-31 DIAGNOSIS — C50412 Malignant neoplasm of upper-outer quadrant of left female breast: Secondary | ICD-10-CM | POA: Insufficient documentation

## 2023-01-31 DIAGNOSIS — Z800 Family history of malignant neoplasm of digestive organs: Secondary | ICD-10-CM | POA: Insufficient documentation

## 2023-01-31 NOTE — Progress Notes (Signed)
Cancer Center Cancer Follow up:    [name], MD [address] [place] [address]   DIAGNOSIS:  Cancer Staging  Malignant neoplasm of upper-outer quadrant of left female breast [facility]) Staging form: Breast, AJCC 8th Edition - Clinical stage from 06/28/2022: Stage IB (cT1c, cN0(f), cM0, G3, ER+, PR-, HER2-) - Signed by Ronny Bacon, PA-C on 06/28/2022 Stage prefix: Initial diagnosis Method of lymph node assessment: Core biopsy Histologic grading system: 3 grade system   SUMMARY OF ONCOLOGIC HISTORY: Oncology History  Malignant neoplasm of upper-outer quadrant of left female breast (HCC)  06/28/2022 Initial Diagnosis   Primary malignant neoplasm of 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  The report date is July 05, 2022. Negative genetic testing on the Multi-cancer panel.  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  BLM c.3136G>A (p.Gly1046Ser), EGFR c.61G>A (p.Ala21Thr) and PDGFRA c.50G>T (p.Gly17Val) VUS identified.  The report date is July 11, 2022.  The STAT Breast cancer panel offered by [facility] includes sequencing and rearrangement analysis for the following 9 genes:  ATM, BRCA1, BRCA2, CDH1, CHEK2, PALB2, PTEN, STK11 and TP53.   The Multi-Cancer + RNA Panel offered by [facility] includes sequencing and/or deletion/duplication analysis of the following 70 genes:  AIP*, ALK, APC*, ATM*, AXIN2*, BAP1*, BARD1*, BLM*, BMPR1A*, BRCA1*, BRCA2*, BRIP1*, CDC73*, CDH1*, CDK4, CDKN1B*, CDKN2A, CHEK2*, CTNNA1*, DICER1*, EPCAM (del/dup only), EGFR, FH*, FLCN*, GREM1  (promoter dup only), HOXB13, KIT, LZTR1, MAX*, MBD4, MEN1*, MET, MITF, MLH1*, MSH2*, MSH3*, MSH6*, MUTYH*, NF1*, NF2*, NTHL1*, PALB2*, PDGFRA, PMS2*, POLD1*, POLE*, POT1*, PRKAR1A*, PTCH1*, PTEN*, RAD51C*, RAD51D*, RB1*, RET, SDHA* (sequencing only), SDHAF2*, SDHB*, SDHC*, SDHD*, SMAD4*, SMARCA4*, SMARCB1*, SMARCE1*, STK11*, SUFU*, TMEM127*, TP53*, TSC1*, TSC2*, VHL*. RNA analysis is performed for * genes.    07/20/2022 -  Chemotherapy   Patient is on Treatment Plan : BREAST ADJUVANT DOSE DENSE AC q14d / PACLitaxel q7d       CURRENT THERAPY: She will be starting radiation  INTERVAL HISTORY:  [name] 42 y.o. female returns for f/u prior to treatment.  She had new adjuvant chemotherapy and is here after her surgery to review pathology details.   She is healing well from surgery.  She denies any adverse effects from chemotherapy. Rest of the pertinent 10 point ROS reviewed and negative  Patient Active Problem List   Diagnosis Date Noted   Port-A-Cath in place 07/20/2022   Genetic testing 07/07/2022   Family history of stomach cancer 06/29/2022   Malignant neoplasm of upper-outer quadrant of left female breast (HCC) 06/27/2022   Seborrheic dermatitis of scalp 02/25/2018    is allergic to adriamycin [doxorubicin].  MEDICAL HISTORY: Past Medical History:  Diagnosis Date   Breast cancer (HCC) 06/23/2022   Family history of stomach cancer    GERD (gastroesophageal reflux disease)    Hyperthyroidism    due to Graves' disease   Multinodular thyroid    Thyroid disease     SURGICAL HISTORY: Past Surgical History:  Procedure Laterality Date   BREAST BIOPSY Left 06/23/2022   [place] LT BREAST BX W LOC DEV 1ST LESION IMG BX  SPEC US GUIDE 06/23/2022 [facility]   BREAST BIOPSY Left 01/02/2023   [place] LT RADIOACTIVE SEED LOC 01/02/2023 [facility]MO[facility]   BREAST BIOPSY  01/02/2023   MM RT RADIOACTIVE SEED LOC MAMMO GUIDE 01/02/2023 [facility]MO[facility]AP[facility]   BREAST LUMPECTOMY WITH  RADIOACTIVE SEED AND SENTINEL LYMPH NODE BIOPSY Left 01/04/2023   Procedure: LEFT BREAST LUMPECTOMY WITH RADIOACTIVE SEED AND SENTINEL LYMPH NODE BIOPSY;  Surgeon: Griselda Miner, MD;  Location: [facility] OR;  Service: General;  Laterality: Left;  PEC BLOCK   BREAST LUMPECTOMY WITH RADIOACTIVE SEED LOCALIZATION Right 01/04/2023   Procedure: RIGHT BREAST LUMPECTOMY WITH RADIOACTIVE SEED LOCALIZATION;  Surgeon: Griselda Miner, MD;  Location: [facility] OR;  Service: General;  Laterality: Right;   BREAST REDUCTION WITH MASTOPEXY Bilateral 01/04/2023   Procedure: BREAST REDUCTION WITH MASTOPEXY;  Surgeon: Peggye Form, DO;  Location: [facility] OR;  Service: Plastics;  Laterality: Bilateral;   PORTACATH PLACEMENT Right 07/19/2022   Procedure: INSERTION PORT-A-CATH;  Surgeon: Griselda Miner, MD;  Location: [facility];  Service: General;  Laterality: Right;  60 MIN ROOM 8   WISDOM TOOTH EXTRACTION      SOCIAL HISTORY: Social History   Socioeconomic History   Marital status: Single    Spouse name: Not on file   Number of children: Not on file   Years of education: Not on file   Highest education level: Not on file  Occupational History   Not on file  Tobacco Use   Smoking status: Never   Smokeless tobacco: Never  Vaping Use   Vaping status: Never Used  Substance and Sexual Activity   Alcohol use: Yes    Comment: socially   Drug use: No   Sexual activity: Yes    Partners: Male    Birth control/protection: None    Comment: intercourse age 86, more than 6 sexual parters,   Other Topics Concern   Not on file  Social History Narrative   Not on file   Social Determinants of Health   Financial Resource Strain: Low Risk  (07/04/2022)   Overall Financial Resource Strain (CARDIA)    Difficulty of Paying Living Expenses: Not very hard  Food Insecurity: No Food Insecurity (06/28/2022)   Hunger Vital Sign    Worried About Running Out of Food in the Last Year: Never true    Ran Out of Food in the  Last Year: Never true  Transportation Needs: No Transportation Needs (06/28/2022)   PRAPARE - Administrator, Civil Service (Medical): No    Lack of Transportation (Non-Medical): No  Physical Activity: Not on file  Stress: Not on file  Social Connections: Unknown (01/09/2023)   Received from [facility]   Social Connections    Frequency of Communication with Friends and Family: Not asked    Frequency of Social Gatherings with Friends and Family: Not asked  Intimate Partner Violence: Unknown (01/09/2023)   Received from [facility]   Intimate Partner Violence    Fear of Current or Ex-Partner: Not asked    Emotionally Abused: Not asked    Physically Abused: Not asked    Sexually Abused: Not asked    FAMILY HISTORY: Family History  Problem Relation Age of Onset   Heart attack Father 7   Stomach cancer Maternal Aunt        dx > 50   Throat cancer Maternal Uncle        dx 71s   Stomach cancer Maternal Uncle  dx > 50   Heart Problems Maternal Grandmother    Breast cancer Cousin        mother's maternal first cousin    Review of Systems  Constitutional:  Positive for fatigue. Negative for appetite change, chills, fever and unexpected weight change.  HENT:   Negative for hearing loss, lump/mass and trouble swallowing.   Eyes:  Negative for eye problems and icterus.  Respiratory:  Negative for chest tightness, cough and shortness of breath.   Cardiovascular:  Negative for chest pain, leg swelling and palpitations.  Gastrointestinal:  Negative for abdominal distention, abdominal pain, constipation, diarrhea, nausea and vomiting.  Endocrine: Positive for hot flashes.  Genitourinary:  Negative for difficulty urinating.   Musculoskeletal:  Negative for arthralgias.  Skin:  Negative for itching and rash.  Neurological:  Negative for dizziness, extremity weakness, headaches and numbness.  Hematological:  Negative for adenopathy. Does not bruise/bleed easily.   Psychiatric/Behavioral:  Positive for sleep disturbance. Negative for depression. The patient is not nervous/anxious.       PHYSICAL EXAMINATION  Vitals:   01/31/23 1439  BP: 113/70  Pulse: 92  Resp: 18  Temp: 98 F (36.7 C)  SpO2: 100%   Physical exam deferred in lieu of counseling LABORATORY DATA:  CBC    Component Value Date/Time   WBC 6.4 12/28/2022 1440   RBC 4.47 12/28/2022 1440   HGB 12.1 12/28/2022 1440   HGB 10.8 (L) 11/30/2022 0758   HCT 38.0 12/28/2022 1440   PLT 283 12/28/2022 1440   PLT 294 11/30/2022 0758   MCV 85.0 12/28/2022 1440   MCH 27.1 12/28/2022 1440   MCHC 31.8 12/28/2022 1440   RDW 18.6 (H) 12/28/2022 1440   LYMPHSABS 1.7 11/30/2022 0758   MONOABS 0.2 11/30/2022 0758   EOSABS 0.2 11/30/2022 0758   BASOSABS 0.1 11/30/2022 0758    CMP     Component Value Date/Time   NA 136 12/28/2022 1440   K 3.7 12/28/2022 1440   CL 101 12/28/2022 1440   CO2 27 12/28/2022 1440   GLUCOSE 79 12/28/2022 1440   BUN 5 (L) 12/28/2022 1440   CREATININE 0.69 12/28/2022 1440   CREATININE 0.72 11/30/2022 0758   CALCIUM 9.0 12/28/2022 1440   PROT 6.6 11/30/2022 0758   ALBUMIN 3.8 11/30/2022 0758   AST 17 11/30/2022 0758   ALT 18 11/30/2022 0758   ALKPHOS 62 11/30/2022 0758   BILITOT 0.2 (L) 11/30/2022 0758   GFRNONAA >60 12/28/2022 1440   GFRNONAA >60 11/30/2022 0758       PENDING LABS:   RADIOGRAPHIC STUDIES:  No results found.   PATHOLOGY: Residual IDC with focal sarcomatoid changes 1.6 and meters grade 3, high-grade DCIS, negative margins, no evidence of lymph node involvement, residual cancer burden to be determined.  Repeat prognostics pending.  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  She received neoadjuvant AC-T.  She tolerated chemotherapy very well.  Final surgery showed residual IDC with focal sarcomatoid changes measuring 1.6 cm grade 3, high-grade DCIS, negative margins, no lymph node involvement no evidence of lymphovascular or perineural invasion in the primary tumor.  There was no comment on residual cancer burden.  We will discuss with pathology to  have this hydrated.  I would also recommend repeating prognostics since her original biopsy showed ER 30% weak staining.  I have today discussed about considering adjuvant capecitabine especially if she is ER negative on repeat prognostics.  I have reviewed the mechanism of action, adverse effects of capecitabine including but not limited to fatigue, nausea, vomiting, diarrhea, hand-foot syndrome, coronary vasospasm and severe toxicity in patients with DPD deficiency.  She is interested in proceeding with adjuvant radiation followed by capecitabine if this is what we would recommend.   All questions were answered. The patient knows to call the clinic with any problems, questions or concerns. We can certainly see the patient much sooner if necessary.  Total encounter time:30 minutes*in face-to-face visit time, chart review, lab review, care coordination, order entry, and documentation of the encounter time.  *Total Encounter Time as defined by the Centers for Medicare and Medicaid Services includes, in addition to the face-to-face time of a patient visit (documented in the note above) non-face-to-face time: obtaining and reviewing outside history, ordering and reviewing medications, tests or procedures, care coordination (communications with other health care professionals or caregivers) and documentation in the medical record.

## 2023-02-01 ENCOUNTER — Ambulatory Visit: Payer: Managed Care, Other (non HMO) | Admitting: Radiation Oncology

## 2023-02-01 ENCOUNTER — Ambulatory Visit: Payer: Managed Care, Other (non HMO)

## 2023-02-02 ENCOUNTER — Other Ambulatory Visit: Payer: Self-pay

## 2023-02-08 ENCOUNTER — Ambulatory Visit: Payer: Managed Care, Other (non HMO) | Admitting: Physician Assistant

## 2023-02-08 ENCOUNTER — Telehealth: Payer: Self-pay | Admitting: Plastic Surgery

## 2023-02-08 ENCOUNTER — Encounter: Payer: Self-pay | Admitting: Physician Assistant

## 2023-02-08 ENCOUNTER — Encounter: Payer: Self-pay | Admitting: *Deleted

## 2023-02-08 VITALS — BP 112/77 | HR 92

## 2023-02-08 DIAGNOSIS — Z9889 Other specified postprocedural states: Secondary | ICD-10-CM

## 2023-02-08 DIAGNOSIS — C50412 Malignant neoplasm of upper-outer quadrant of left female breast: Secondary | ICD-10-CM

## 2023-02-08 NOTE — Progress Notes (Signed)
Patient is a pleasant 42 year old female with PMH of left-sided breast cancer s/p bilateral lumpectomy with left-sided sentinel lymph node biopsy and bilateral oncoplastic reduction for symmetry performed 01/04/2023 by Dr. Ulice Bold and Dr. Carolynne Edouard who presents to clinic for postoperative follow-up.   She was last seen here in clinic on 01/23/2023.  At that time, she was doing well and her exam was entirely benign.  Given that Steri-Strips remained firmly intact, decided to leave them in place until subsequent encounter.  Recommended continued activity modifications and compressive garments.  Today, patient is doing well.  She does state that she is profoundly itchy.  She had radiation mapping performed yesterday by her radiation oncologist, Dr. Alisa Graff, in [place].  They inquired about how soon it would be before they can start, but patient tells me that they did not seem to express any particular urgency.  She tells me that she has been itchy across her bilateral breasts, but denies any rashes.  Her Steri-Strips have remained intact and in place.  She denies any leg swelling, fevers, or pain.  On exam, breasts have excellent shape and symmetry.  NAC's are healthy and viable.  Steri-Strips removed without complication or difficulty.  No incisional wounds, healing nicely.  Breasts are soft throughout with the exception of an area of firmness underneath the malignancy excision site left breast, upper outer region.  Nontender, no overlying skin changes.  No rash noted.  Considerable amount of overlying Dermabond.  Suspect the itching is being provoked by the residual Dermabond as well as her Steri-Strips.  Steri-Strips were removed and recommending twice daily Vaseline to her incisions throughout to help with the residual Dermabond.  Recommending that she take a second-generation antihistamine in the morning and then Benadryl at night.  As for initiating RT, would prefer that she abstain until she is seen here  again in 2 weeks for likely final postoperative encounter.  However, if they do need to begin RT sooner, suspect that she would be fine.  Exam today was entirely benign.  Picture(s) obtained of the patient and placed in the chart were with the patient's or guardian's permission.

## 2023-02-08 NOTE — Telephone Encounter (Signed)
Jena from Dr [facility] office wants the provider to give them a call, Quentin Angst number is [phone], Dr Nira Conn number is [phone], its concerning radiation. Thank you

## 2023-02-09 ENCOUNTER — Ambulatory Visit: Payer: Self-pay | Admitting: General Surgery

## 2023-02-09 NOTE — Telephone Encounter (Signed)
Spoke with Dr. Alisa Graff, they will hold off on beginning RT until after 02/21/2023.

## 2023-02-14 ENCOUNTER — Encounter: Payer: Self-pay | Admitting: Radiology

## 2023-02-14 ENCOUNTER — Ambulatory Visit (INDEPENDENT_AMBULATORY_CARE_PROVIDER_SITE_OTHER): Payer: Managed Care, Other (non HMO) | Admitting: Radiology

## 2023-02-14 VITALS — BP 118/72 | HR 72 | Ht 64.0 in | Wt 187.6 lb

## 2023-02-14 DIAGNOSIS — B9689 Other specified bacterial agents as the cause of diseases classified elsewhere: Secondary | ICD-10-CM

## 2023-02-14 DIAGNOSIS — N760 Acute vaginitis: Secondary | ICD-10-CM

## 2023-02-14 DIAGNOSIS — Z113 Encounter for screening for infections with a predominantly sexual mode of transmission: Secondary | ICD-10-CM

## 2023-02-14 DIAGNOSIS — N898 Other specified noninflammatory disorders of vagina: Secondary | ICD-10-CM

## 2023-02-14 LAB — WET PREP FOR TRICH, YEAST, CLUE

## 2023-02-14 MED ORDER — METRONIDAZOLE 500 MG PO TABS
500.0000 mg | ORAL_TABLET | Freq: Two times a day (BID) | ORAL | 0 refills | Status: DC
Start: 2023-02-14 — End: 2023-02-21

## 2023-02-14 NOTE — Progress Notes (Signed)
      Subjective: Nicole Maddox is a 42 y.o. female who complains of vaginal irritation since Monday 02/12/23. States that it is more of a discomfort, no itching. No bleeding. No changes in discharge--smell, color, consistency. No new soaps, feminine products, or detergents/fabric softeners. No burning with urination. Patient stated that it doesn't feel like a yeast infection or UTI. Patient last sexually active over 2 weeks ago. Would like Sti screen.    Review of Systems  Past Medical History:  Diagnosis Date   Breast cancer (HCC) 06/23/2022   Family history of stomach cancer    GERD (gastroesophageal reflux disease)    Hyperthyroidism    due to Graves' disease   Multinodular thyroid    Thyroid disease       Objective:  Today's Vitals   02/14/23 1459  BP: 118/72  Pulse: 72  Weight: 187 lb 9.6 oz (85.1 kg)  Height: 5\' 4"  (1.626 m)  PainSc: 0-No pain   Body mass index is 32.2 kg/m.   -General: no acute distress -Vulva: without lesions or discharge -Vagina: discharge present, aptima swab and wet prep obtained -Cervix: no lesion or discharge, no CMT -Perineum: no lesions -Uterus: Mobile, non tender -Adnexa: no masses or tenderness   Microscopic wet-mount exam shows clue cells.   Cinda Quest, RN present for exam  Assessment:/Plan:   1. Screening for venereal disease - SURESWAB CT/NG/T. vaginalis  2. Vaginal irritation - WET PREP FOR TRICH, YEAST, CLUE +BV rx sent for flagyl 500mg  po BID x 7days   Will contact patient with results of testing completed today. Avoid intercourse until symptoms are resolved. Safe sex encouraged. Avoid the use of soaps or perfumed products in the peri area. Avoid tub baths and sitting in sweaty or wet clothing for prolonged periods of time.

## 2023-02-15 LAB — SURESWAB CT/NG/T. VAGINALIS
C. trachomatis RNA, TMA: NOT DETECTED
N. gonorrhoeae RNA, TMA: NOT DETECTED
Trichomonas vaginalis RNA: NOT DETECTED

## 2023-02-21 ENCOUNTER — Encounter: Payer: Self-pay | Admitting: Physician Assistant

## 2023-02-21 ENCOUNTER — Ambulatory Visit: Payer: Managed Care, Other (non HMO) | Admitting: Physician Assistant

## 2023-02-21 ENCOUNTER — Other Ambulatory Visit: Payer: Self-pay

## 2023-02-21 ENCOUNTER — Encounter (HOSPITAL_BASED_OUTPATIENT_CLINIC_OR_DEPARTMENT_OTHER): Payer: Self-pay | Admitting: General Surgery

## 2023-02-21 VITALS — BP 114/79 | HR 86

## 2023-02-21 DIAGNOSIS — C50412 Malignant neoplasm of upper-outer quadrant of left female breast: Secondary | ICD-10-CM

## 2023-02-21 DIAGNOSIS — Z9889 Other specified postprocedural states: Secondary | ICD-10-CM

## 2023-02-21 MED ORDER — CHLORHEXIDINE GLUCONATE CLOTH 2 % EX PADS: 6.0000 | MEDICATED_PAD | Freq: Once | CUTANEOUS | Status: DC

## 2023-02-21 NOTE — Progress Notes (Signed)
Patient is a pleasant 42 year old female with PMH of left-sided breast cancer s/p bilateral lumpectomy with left-sided sentinel lymph node biopsy and bilateral oncoplastic reduction for symmetry performed 01/04/2023 by Dr. Ulice Bold and Dr. Carolynne Edouard who presents to clinic for postoperative follow-up.   She was last seen here in clinic on 02/08/2023.  At that time, she reported that she was profoundly itchy.  Her Steri-Strips had remained in place and were removed at that encounter given concern that they are contributing to the possible itchiness.  Her exam was entirely benign.  There was a considerable amount of overlying Dermabond which may also be contributing to the patient's pruritus.  Recommended Vaseline twice daily to her incisions throughout as well as twice daily antihistamines.  She had just received radiation mapping performed by her radiation oncologist, Dr. Alisa Graff, in [place].  Personally called and spoke with Dr. Alisa Graff who reports that there is no urgency to initiating treatment and that they would defer to our clearance from a wound healing standpoint.  Today, patient is doing well.  She will occasionally have some persistent itching across bilateral breast, but states that now she feels more comfortable relieving her itching by gently scratching.  She states that she has been doing once daily antihistamines and Vaseline, as discussed.  She reports that it is better than it was last time she was here in clinic.  She will periodically experience fleeting discomfort around areolas bilaterally, likely neuropathic.  She also states that she will experience tenderness from where the cancer was excised on the left upper breast.  Otherwise, doing well and ready to proceed with radiation therapy.  On exam, breasts have excellent shape and symmetry.  NAC's are healthy.  Incisions CDI and well-healed throughout.  No residual Dermabond noted.  No rashes, erythema, or other overlying skin changes  appreciated on exam.  Mild well-circumscribed area of firmness underneath the malignancy excision site left upper breast.  Breasts are otherwise soft throughout.  Suspect that she has a small area of fat necrosis versus resolving hematoma underneath the malignancy excision site given her reports of tenderness and appreciable area of firmness on exam.  However, nothing acute or concerning.  Informed patient that this will likely soften and improve with time.  As for her itching, no obvious rashes noted.  Getting better.  Informed patient that she can transition to normal bras.  She can also increase activity as tolerated and lift any additional restrictions.  She looks excellent from a postoperative standpoint.  Follow-up only as needed at this point.  She can proceed with radiation treatment.  All questions answered.  Picture(s) obtained of the patient and placed in the chart were with the patient's or guardian's permission.

## 2023-02-21 NOTE — Progress Notes (Signed)

## 2023-02-28 ENCOUNTER — Telehealth: Payer: Self-pay

## 2023-02-28 NOTE — Telephone Encounter (Signed)
Spoke to patient informing her that her [employer] documents had been completed and faxed to the company. Fax conformation received. Copy of documents placed up front for pick up tomorrow a requested.

## 2023-03-01 ENCOUNTER — Ambulatory Visit (HOSPITAL_BASED_OUTPATIENT_CLINIC_OR_DEPARTMENT_OTHER): Payer: Managed Care, Other (non HMO) | Admitting: Anesthesiology

## 2023-03-01 ENCOUNTER — Other Ambulatory Visit: Payer: Self-pay

## 2023-03-01 ENCOUNTER — Ambulatory Visit (HOSPITAL_BASED_OUTPATIENT_CLINIC_OR_DEPARTMENT_OTHER)
Admission: RE | Admit: 2023-03-01 | Discharge: 2023-03-01 | Disposition: A | Discharge status: Home / Self Care | Payer: Managed Care, Other (non HMO) | Source: Ambulatory Visit | Attending: General Surgery | Admitting: General Surgery

## 2023-03-01 ENCOUNTER — Encounter (HOSPITAL_BASED_OUTPATIENT_CLINIC_OR_DEPARTMENT_OTHER): Payer: Self-pay | Admitting: General Surgery

## 2023-03-01 ENCOUNTER — Encounter (HOSPITAL_BASED_OUTPATIENT_CLINIC_OR_DEPARTMENT_OTHER)
Admission: RE | Disposition: A | Discharge status: Home / Self Care | Payer: Self-pay | Source: Ambulatory Visit | Attending: General Surgery

## 2023-03-01 DIAGNOSIS — E119 Type 2 diabetes mellitus without complications: Secondary | ICD-10-CM | POA: Insufficient documentation

## 2023-03-01 DIAGNOSIS — Z7984 Long term (current) use of oral hypoglycemic drugs: Secondary | ICD-10-CM | POA: Insufficient documentation

## 2023-03-01 DIAGNOSIS — Z808 Family history of malignant neoplasm of other organs or systems: Secondary | ICD-10-CM | POA: Diagnosis not present

## 2023-03-01 DIAGNOSIS — Z833 Family history of diabetes mellitus: Secondary | ICD-10-CM | POA: Diagnosis not present

## 2023-03-01 DIAGNOSIS — C50412 Malignant neoplasm of upper-outer quadrant of left female breast: Secondary | ICD-10-CM | POA: Insufficient documentation

## 2023-03-01 DIAGNOSIS — Z803 Family history of malignant neoplasm of breast: Secondary | ICD-10-CM | POA: Diagnosis not present

## 2023-03-01 DIAGNOSIS — Z170 Estrogen receptor positive status [ER+]: Secondary | ICD-10-CM | POA: Insufficient documentation

## 2023-03-01 DIAGNOSIS — Z01818 Encounter for other preprocedural examination: Secondary | ICD-10-CM

## 2023-03-01 DIAGNOSIS — K219 Gastro-esophageal reflux disease without esophagitis: Secondary | ICD-10-CM | POA: Insufficient documentation

## 2023-03-01 DIAGNOSIS — Z452 Encounter for adjustment and management of vascular access device: Secondary | ICD-10-CM | POA: Diagnosis not present

## 2023-03-01 HISTORY — DX: Family history of other specified conditions: Z84.89

## 2023-03-01 HISTORY — PX: PORT-A-CATH REMOVAL: SHX5289

## 2023-03-01 LAB — POCT PREGNANCY, URINE: Preg Test, Ur: NEGATIVE

## 2023-03-01 SURGERY — REMOVAL PORT-A-CATH
Anesthesia: Monitor Anesthesia Care

## 2023-03-01 MED ORDER — ACETAMINOPHEN 325 MG PO TABS: 325.0000 mg | ORAL_TABLET | ORAL | Status: DC | PRN

## 2023-03-01 MED ORDER — DROPERIDOL 2.5 MG/ML IJ SOLN: 0.6250 mg | Freq: Once | INTRAMUSCULAR | Status: DC | PRN

## 2023-03-01 MED ORDER — MIDAZOLAM HCL 2 MG/2ML IJ SOLN
INTRAMUSCULAR | Status: DC | PRN
  Administered 2023-03-01: 2 mg via INTRAVENOUS

## 2023-03-01 MED ORDER — BUPIVACAINE-EPINEPHRINE 0.25% -1:200000 IJ SOLN
INTRAMUSCULAR | Status: DC | PRN
  Administered 2023-03-01: 10 mL

## 2023-03-01 MED ORDER — ACETAMINOPHEN 10 MG/ML IV SOLN: 1000.0000 mg | Freq: Once | INTRAVENOUS | Status: DC | PRN

## 2023-03-01 MED ORDER — ACETAMINOPHEN 500 MG PO TABS
1000.0000 mg | ORAL_TABLET | Freq: Once | ORAL | Status: AC
  Administered 2023-03-01: 1000 mg via ORAL

## 2023-03-01 MED ORDER — FENTANYL CITRATE (PF) 250 MCG/5ML IJ SOLN
INTRAMUSCULAR | Status: DC | PRN
  Administered 2023-03-01 (×2): 50 ug via INTRAVENOUS

## 2023-03-01 MED ORDER — CEFAZOLIN SODIUM-DEXTROSE 2-4 GM/100ML-% IV SOLN
INTRAVENOUS | Status: AC
  Filled 2023-03-01: qty 100

## 2023-03-01 MED ORDER — OXYCODONE HCL 5 MG PO TABS: 5.0000 mg | ORAL_TABLET | Freq: Once | ORAL | Status: DC | PRN

## 2023-03-01 MED ORDER — OXYCODONE HCL 5 MG PO TABS: 5.0000 mg | ORAL_TABLET | Freq: Four times a day (QID) | ORAL | 0 refills | Status: DC | PRN

## 2023-03-01 MED ORDER — FENTANYL CITRATE (PF) 100 MCG/2ML IJ SOLN
INTRAMUSCULAR | Status: AC
  Filled 2023-03-01: qty 2

## 2023-03-01 MED ORDER — PROPOFOL 10 MG/ML IV BOLUS
INTRAVENOUS | Status: AC
  Filled 2023-03-01: qty 20

## 2023-03-01 MED ORDER — PROPOFOL 500 MG/50ML IV EMUL
INTRAVENOUS | Status: DC | PRN
  Administered 2023-03-01: 100 ug/kg/min via INTRAVENOUS

## 2023-03-01 MED ORDER — ACETAMINOPHEN 500 MG PO TABS
ORAL_TABLET | ORAL | Status: AC
  Filled 2023-03-01: qty 2

## 2023-03-01 MED ORDER — KETOROLAC TROMETHAMINE 15 MG/ML IJ SOLN
INTRAMUSCULAR | Status: DC | PRN
Start: 2023-03-01 — End: 2023-03-01
  Administered 2023-03-01: 15 mg via INTRAVENOUS

## 2023-03-01 MED ORDER — BUPIVACAINE-EPINEPHRINE (PF) 0.25% -1:200000 IJ SOLN
INTRAMUSCULAR | Status: AC
  Filled 2023-03-01: qty 30

## 2023-03-01 MED ORDER — SCOPOLAMINE 1 MG/3DAYS TD PT72
MEDICATED_PATCH | TRANSDERMAL | Status: AC
  Filled 2023-03-01: qty 1

## 2023-03-01 MED ORDER — ONDANSETRON HCL 4 MG/2ML IJ SOLN
INTRAMUSCULAR | Status: DC | PRN
  Administered 2023-03-01: 4 mg via INTRAVENOUS

## 2023-03-01 MED ORDER — OXYCODONE HCL 5 MG/5ML PO SOLN: 5.0000 mg | Freq: Once | ORAL | Status: DC | PRN

## 2023-03-01 MED ORDER — BUPIVACAINE HCL (PF) 0.25 % IJ SOLN
INTRAMUSCULAR | Status: AC
  Filled 2023-03-01: qty 60

## 2023-03-01 MED ORDER — DEXAMETHASONE SODIUM PHOSPHATE 10 MG/ML IJ SOLN
INTRAMUSCULAR | Status: DC | PRN
  Administered 2023-03-01: 4 mg via INTRAVENOUS

## 2023-03-01 MED ORDER — PROMETHAZINE HCL 25 MG/ML IJ SOLN: 6.2500 mg | INTRAMUSCULAR | Status: DC | PRN

## 2023-03-01 MED ORDER — ACETAMINOPHEN 160 MG/5ML PO SOLN: 325.0000 mg | ORAL | Status: DC | PRN

## 2023-03-01 MED ORDER — LACTATED RINGERS IV SOLN: INTRAVENOUS | Status: DC

## 2023-03-01 MED ORDER — SCOPOLAMINE 1 MG/3DAYS TD PT72
1.0000 | MEDICATED_PATCH | TRANSDERMAL | Status: DC
  Administered 2023-03-01: 1.5 mg via TRANSDERMAL

## 2023-03-01 MED ORDER — FENTANYL CITRATE (PF) 100 MCG/2ML IJ SOLN: 25.0000 ug | INTRAMUSCULAR | Status: DC | PRN

## 2023-03-01 MED ORDER — MIDAZOLAM HCL 2 MG/2ML IJ SOLN
INTRAMUSCULAR | Status: AC
  Filled 2023-03-01: qty 2

## 2023-03-01 SURGICAL SUPPLY — 30 items
ADH SKN CLS APL DERMABOND .7 (GAUZE/BANDAGES/DRESSINGS) ×1
APL PRP STRL LF DISP 70% ISPRP (MISCELLANEOUS) ×1
BLADE SURG 15 STRL LF DISP TIS (BLADE) ×1 IMPLANT
BLADE SURG 15 STRL SS (BLADE) ×1
CHLORAPREP W/TINT 26 (MISCELLANEOUS) ×1 IMPLANT
COVER BACK TABLE 60X90IN (DRAPES) ×1 IMPLANT
COVER MAYO STAND STRL (DRAPES) ×1 IMPLANT
DERMABOND ADVANCED .7 DNX12 (GAUZE/BANDAGES/DRESSINGS) ×1 IMPLANT
DRAPE LAPAROTOMY 100X72 PEDS (DRAPES) ×1 IMPLANT
DRAPE UTILITY XL STRL (DRAPES) ×1 IMPLANT
ELECT COATED BLADE 2.86 ST (ELECTRODE) IMPLANT
ELECT REM PT RETURN 9FT ADLT (ELECTROSURGICAL)
ELECTRODE REM PT RTRN 9FT ADLT (ELECTROSURGICAL) IMPLANT
GLOVE BIO SURGEON STRL SZ7.5 (GLOVE) ×1 IMPLANT
GLOVE BIOGEL PI IND STRL 7.5 (GLOVE) IMPLANT
GOWN STRL REUS W/ TWL LRG LVL3 (GOWN DISPOSABLE) ×2 IMPLANT
GOWN STRL REUS W/ TWL XL LVL3 (GOWN DISPOSABLE) IMPLANT
GOWN STRL REUS W/TWL LRG LVL3 (GOWN DISPOSABLE) ×2
GOWN STRL REUS W/TWL XL LVL3 (GOWN DISPOSABLE) ×1
NDL HYPO 25X1 1.5 SAFETY (NEEDLE) ×1 IMPLANT
NEEDLE HYPO 25X1 1.5 SAFETY (NEEDLE) ×1
PACK BASIN DAY SURGERY FS (CUSTOM PROCEDURE TRAY) ×1 IMPLANT
PENCIL SMOKE EVACUATOR (MISCELLANEOUS) IMPLANT
SLEEVE SCD COMPRESS KNEE MED (STOCKING) IMPLANT
SPIKE FLUID TRANSFER (MISCELLANEOUS) ×1 IMPLANT
SUT MON AB 4-0 PC3 18 (SUTURE) ×1 IMPLANT
SUT VIC AB 3-0 SH 27 (SUTURE) ×1
SUT VIC AB 3-0 SH 27X BRD (SUTURE) ×1 IMPLANT
SYR CONTROL 10ML LL (SYRINGE) ×1 IMPLANT
TOWEL GREEN STERILE FF (TOWEL DISPOSABLE) ×1 IMPLANT

## 2023-03-01 NOTE — H&P (Addendum)
REFERRING PHYSICIAN: Self  PROVIDER: Lindell Noe, MD  MRN: [id number] DOB: 12/17/1980 Subjective   Chief Complaint: Post Operative Visit   History of Present Illness: Nicole Maddox is a 42 y.o. female who is seen today as an office consultation for evaluation of Post Operative Visit .   The patient is now 3 weeks status post left breast lumpectomy for a T1c N0 left breast cancer that was weakly ER positive and PR negative and HER2 negative with a Ki-67 of 95%. Her right lumpectomy was benign. She did receive neoadjuvant therapy. She tolerated the surgery well. She also underwent bilateral breast reduction at the same time. She denies any significant breast pain.   Review of Systems: A complete review of systems was obtained from the patient. I have reviewed this information and discussed as appropriate with the patient. See HPI as well for other ROS.  ROS   Medical History: Past Medical History:  Diagnosis Date  GERD (gastroesophageal reflux disease)  Thyroid disease   Patient Active Problem List  Diagnosis  Malignant neoplasm of upper-outer quadrant of left female breast (CMS/HHS-HCC)   Past Surgical History:  Procedure Laterality Date  keloid    No Known Allergies  Current Outpatient Medications on File Prior to Visit  Medication Sig Dispense Refill  ASHWAGANDHA EXTRACT ORAL Take by mouth  etodolac (LODINE) 500 MG tablet Take 500 mg by mouth 2 (two) times daily  folic acid (FOLVITE) 1 MG tablet Take 1,000 mcg by mouth once daily  fructooligosaccharides (PREBIOTIC FIBER, FOS, ORAL) Take by mouth  ketoconazole (NIZORAL) 2 % shampoo as directed  L-TYROSINE ORAL Take 500 mg by mouth  methIMAzole (TAPAZOLE) 5 MG tablet Take 10 mg by mouth every morning  MYFEMBREE 40-1-0.5 mg Tab Take 1 tablet by mouth once daily  omeprazole (PRILOSEC) 40 MG DR capsule Take 40 mg by mouth once daily  SELENIUM ORAL Take 300 mg by mouth  sulfamethoxazole-trimethoprim (BACTRIM DS)  800-160 mg tablet TAKE 1 TABLET BY MOUTH TWICE A DAY FOR 3 DAYS  zinc acetate 50 mg (zinc) Cap Take by mouth   No current facility-administered medications on file prior to visit.   Family History  Problem Relation Age of Onset  Diabetes Mother  High blood pressure (Hypertension) Mother  Stroke Other  Skin cancer Other  Breast cancer Other    Social History   Tobacco Use  Smoking Status Never  Smokeless Tobacco Never    Social History   Socioeconomic History  Marital status: Single  Tobacco Use  Smoking status: Never  Smokeless tobacco: Never  Substance and Sexual Activity  Alcohol use: Defer  Drug use: Defer   Social Determinants of Health   Financial Resource Strain: Low Risk (07/04/2022)  Received from [facility] [place]  Overall Financial Resource Strain (CARDIA)  Difficulty of Paying Living Expenses: Not very hard  Food Insecurity: No Food Insecurity (06/28/2022)  Received from [facility], [place]  Hunger Vital Sign  Worried About Running Out of Food in the Last Year: Never true  Ran Out of Food in the Last Year: Never true  Transportation Needs: No Transportation Needs (06/28/2022)  Received from [facility] [place]  PRAPARE - Transportation  Lack of Transportation (Medical): No  Lack of Transportation (Non-Medical): No  Social Connections: Unknown (01/09/2023)  Received from [facility]  Social Connections  Frequency of Communication with Friends and Family: Not asked  Frequency of Social Gatherings with Friends and Family: Not asked   Objective:  Vitals:  PainSc: 0-No pain  PainLoc: Breast    There is no height or weight on file to calculate BMI.  Physical Exam Vitals reviewed.  Constitutional:  General: She is not in acute distress. Appearance: Normal appearance.  HENT:  Head: Normocephalic and atraumatic.  Right Ear: External ear normal.  Left Ear: External ear normal.  Nose: Nose normal.  Mouth/Throat:  Mouth: Mucous  membranes are moist.  Pharynx: Oropharynx is clear.  Eyes:  General: No scleral icterus. Extraocular Movements: Extraocular movements intact.  Conjunctiva/sclera: Conjunctivae normal.  Pupils: Pupils are equal, round, and reactive to light.  Cardiovascular:  Rate and Rhythm: Normal rate and regular rhythm.  Pulses: Normal pulses.  Heart sounds: Normal heart sounds.  Pulmonary:  Effort: Pulmonary effort is normal. No respiratory distress.  Breath sounds: Normal breath sounds.  Abdominal:  General: Bowel sounds are normal.  Palpations: Abdomen is soft.  Tenderness: There is no abdominal tenderness.  Musculoskeletal:  General: No swelling, tenderness or deformity. Normal range of motion.  Cervical back: Normal range of motion and neck supple.  Skin: General: Skin is warm and dry.  Coloration: Skin is not jaundiced.  Neurological:  General: No focal deficit present.  Mental Status: She is alert and oriented to person, place, and time.  Psychiatric:  Mood and Affect: Mood normal.  Behavior: Behavior normal.     Breast: The upper outer quadrant left breast incision is healing nicely with no sign of infection or significant seroma. Her breast reduction scars are also healing well.  Labs, Imaging and Diagnostic Testing:  Assessment and Plan:   Diagnoses and all orders for this visit:  Malignant neoplasm of upper-outer quadrant of left female breast, unspecified estrogen receptor status (CMS/HHS-HCC)    The patient is about 3 weeks status post left breast lumpectomy for breast cancer and right breast lumpectomy for benign disease. She tolerated the surgery well. At this point she will follow-up with medical and radiation oncology for adjuvant therapy. She will begin returning to normal activities without restriction as determined by plastic surgery. I will plan to see her back in about 6 months. She presents today for port removal

## 2023-03-01 NOTE — Interval H&P Note (Signed)
History and Physical Interval Note:  03/01/2023 9:01 AM  Nicole Maddox  has presented today for surgery, with the diagnosis of LEFT BREAST CANCER.  The various methods of treatment have been discussed with the patient and family. After consideration of risks, benefits and other options for treatment, the patient has consented to  Procedure(s): REMOVAL PORT-A-CATH (N/A) as a surgical intervention.  The patient's history has been reviewed, patient examined, no change in status, stable for surgery.  I have reviewed the patient's chart and labs.  Questions were answered to the patient's satisfaction.     Chevis Pretty III

## 2023-03-01 NOTE — Transfer of Care (Signed)
Immediate Anesthesia Transfer of Care Note  Patient: Nicole Maddox  Procedure(s) Performed: REMOVAL PORT-A-CATH  Patient Location: PACU  Anesthesia Type:MAC  Level of Consciousness: drowsy and patient cooperative  Airway & Oxygen Therapy: Patient Spontanous Breathing and Patient connected to face mask oxygen  Post-op Assessment: Report given to RN and Post -op Vital signs reviewed and stable  Post vital signs: Reviewed and stable  Last Vitals:  Vitals Value Taken Time  BP 115/66 03/01/23 0952  Temp    Pulse 100 03/01/23 0954  Resp 22 03/01/23 0954  SpO2 98 % 03/01/23 0954  Vitals shown include unfiled device data.  Last Pain:  Vitals:   03/01/23 0756  TempSrc: Temporal  PainSc: 0-No pain      Patients Stated Pain Goal: 3 (03/01/23 0756)  Complications: No notable events documented.

## 2023-03-01 NOTE — Anesthesia Preprocedure Evaluation (Addendum)
Anesthesia Evaluation  Patient identified by MRN, date of birth, ID band Patient awake    Reviewed: Allergy & Precautions, NPO status , Patient's Chart, lab work & pertinent test results  Airway Mallampati: I  TM Distance: >3 FB Neck ROM: Full    Dental  (+) Teeth Intact, Dental Advisory Given   Pulmonary neg pulmonary ROS   breath sounds clear to auscultation       Cardiovascular negative cardio ROS  Rhythm:Regular Rate:Normal     Neuro/Psych negative neurological ROS     GI/Hepatic Neg liver ROS,GERD  Medicated,,  Endo/Other  diabetes, Type 2, Oral Hypoglycemic Agents Hyperthyroidism   Renal/GU negative Renal ROS     Musculoskeletal negative musculoskeletal ROS (+)    Abdominal   Peds  Hematology negative hematology ROS (+)   Anesthesia Other Findings   Reproductive/Obstetrics                             Anesthesia Physical Anesthesia Plan  ASA: 2  Anesthesia Plan: MAC   Post-op Pain Management: Tylenol PO (pre-op)* and Toradol IV (intra-op)*   Induction: Intravenous  PONV Risk Score and Plan: 4 or greater and Ondansetron, Dexamethasone, Midazolam, Scopolamine patch - Pre-op and Propofol infusion  Airway Management Planned: Natural Airway and Simple Face Mask  Additional Equipment: None  Intra-op Plan:   Post-operative Plan: Extubation in OR  Informed Consent: I have reviewed the patients History and Physical, chart, labs and discussed the procedure including the risks, benefits and alternatives for the proposed anesthesia with the patient or authorized representative who has indicated his/her understanding and acceptance.     Dental advisory given  Plan Discussed with: CRNA  Anesthesia Plan Comments:        Anesthesia Quick Evaluation

## 2023-03-01 NOTE — Anesthesia Postprocedure Evaluation (Signed)
Anesthesia Post Note  Patient: EVERLIE VOELZ  Procedure(s) Performed: REMOVAL PORT-A-CATH     Patient location during evaluation: PACU Anesthesia Type: MAC Level of consciousness: awake and alert Pain management: pain level controlled Vital Signs Assessment: post-procedure vital signs reviewed and stable Respiratory status: spontaneous breathing, nonlabored ventilation, respiratory function stable and patient connected to nasal cannula oxygen Cardiovascular status: stable and blood pressure returned to baseline Postop Assessment: no apparent nausea or vomiting Anesthetic complications: no  No notable events documented.  Last Vitals:  Vitals:   03/01/23 1015 03/01/23 1045  BP: 104/73 103/79  Pulse: 72 83  Resp: 17 16  Temp:  (!) 36.2 C  SpO2: 99% 99%    Last Pain:  Vitals:   03/01/23 1045  TempSrc:   PainSc: 0-No pain                 Shelton Silvas

## 2023-03-01 NOTE — Discharge Instructions (Signed)
  Post Anesthesia Home Care Instructions  Activity: Get plenty of rest for the remainder of the day. A responsible individual must stay with you for 24 hours following the procedure.  For the next 24 hours, DO NOT: -Drive a car -Advertising copywriter -Drink alcoholic beverages -Take any medication unless instructed by your physician -Make any legal decisions or sign important papers.  Meals: Start with liquid foods such as gelatin or soup. Progress to regular foods as tolerated. Avoid greasy, spicy, heavy foods. If nausea and/or vomiting occur, drink only clear liquids until the nausea and/or vomiting subsides. Call your physician if vomiting continues.  Special Instructions/Symptoms: Your throat may feel dry or sore from the anesthesia or the breathing tube placed in your throat during surgery. If this causes discomfort, gargle with warm salt water. The discomfort should disappear within 24 hours.  If you had a scopolamine patch placed behind your ear for the management of post- operative nausea and/or vomiting:  1. The medication in the patch is effective for 72 hours, after which it should be removed.  Wrap patch in a tissue and discard in the trash. Wash hands thoroughly with soap and water. 2. You may remove the patch earlier than 72 hours if you experience unpleasant side effects which may include dry mouth, dizziness or visual disturbances. 3. Avoid touching the patch. Wash your hands with soap and water after contact with the patch.    Next dose of tylenol if needed is at 2:00pm

## 2023-03-01 NOTE — Op Note (Signed)
03/01/2023  9:49 AM  PATIENT:  Nicole Maddox  43 y.o. female  PRE-OPERATIVE DIAGNOSIS:  LEFT BREAST CANCER  POST-OPERATIVE DIAGNOSIS:  LEFT BREAST CANCER  PROCEDURE:  Procedure(s): REMOVAL PORT-A-CATH (N/A)  SURGEON:  Surgeons and Role:    * Griselda Miner, MD - Primary  PHYSICIAN ASSISTANT:   ASSISTANTS: none   ANESTHESIA:   local and IV sedation  EBL:  minimal   BLOOD ADMINISTERED:none  DRAINS: none   LOCAL MEDICATIONS USED:  MARCAINE     SPECIMEN:  No Specimen  DISPOSITION OF SPECIMEN:  N/A  COUNTS:  YES  TOURNIQUET:  * No tourniquets in log *  DICTATION: .Dragon Dictation  After informed consent was obtained the patient was brought to the operating room and placed in the supine position on the operating table.  After adequate IV sedation had been given the patient's right chest was prepped with ChloraPrep, allowed to dry, and draped in usual sterile manner.  An appropriate timeout was performed.  The area around the port was then infiltrated with quarter percent Marcaine until a good field block was created.  A small incision was made with a 15 blade knife through her previous incision.  The incision was carried through the subcutaneous tissue sharply with a 15 blade knife until the capsule surrounding the port was opened.  The 2 anchoring stitches were divided and removed.  The port was then gently pushed out of its pocket and with gentle traction was removed from the patient without difficulty.  Pressure was held for several minutes until the area was completely hemostatic.  The tubing tract was then closed with a figure-of-eight 3-0 Vicryl stitch.  The subcutaneous tissue was closed with interrupted 3-0 Vicryl stitches.  The skin was then closed with interrupted 4-0 Monocryl subcuticular stitches.  Dermabond dressings were applied.  The patient tolerated the procedure well.  At the end of the case all needle sponge and instrument counts were correct.  The patient was  then awakened and taken to recovery in stable condition.  PLAN OF CARE: Discharge to home after PACU  PATIENT DISPOSITION:  PACU - hemodynamically stable.   Delay start of Pharmacological VTE agent (>24hrs) due to surgical blood loss or risk of bleeding: not applicable

## 2023-03-02 ENCOUNTER — Encounter (HOSPITAL_BASED_OUTPATIENT_CLINIC_OR_DEPARTMENT_OTHER): Payer: Self-pay | Admitting: General Surgery

## 2023-03-16 ENCOUNTER — Inpatient Hospital Stay: Payer: Managed Care, Other (non HMO) | Attending: Hematology and Oncology | Admitting: Hematology and Oncology

## 2023-03-16 VITALS — BP 121/56 | HR 74 | Temp 97.7°F | Resp 14 | Ht 64.0 in | Wt 190.7 lb

## 2023-03-16 DIAGNOSIS — Z803 Family history of malignant neoplasm of breast: Secondary | ICD-10-CM | POA: Insufficient documentation

## 2023-03-16 DIAGNOSIS — Z170 Estrogen receptor positive status [ER+]: Secondary | ICD-10-CM | POA: Insufficient documentation

## 2023-03-16 DIAGNOSIS — C50412 Malignant neoplasm of upper-outer quadrant of left female breast: Secondary | ICD-10-CM | POA: Diagnosis present

## 2023-03-16 DIAGNOSIS — N898 Other specified noninflammatory disorders of vagina: Secondary | ICD-10-CM | POA: Insufficient documentation

## 2023-03-16 NOTE — Progress Notes (Signed)
[facility] Cancer Follow up:    Nicole Caffey, MD [address] [place] [address]   DIAGNOSIS:  Cancer Staging  Malignant neoplasm of upper-outer quadrant of left female breast [facility]) Staging form: Breast, AJCC 8th Edition - Clinical stage from 06/28/2022: Stage IB (cT1c, cN0(f), cM0, G3, ER+, PR-, HER2-) - Signed by Ronny Bacon, PA-C on 06/28/2022 Stage prefix: Initial diagnosis Method of lymph node assessment: Core biopsy Histologic grading system: 3 grade system   SUMMARY OF ONCOLOGIC HISTORY: Oncology History  Malignant neoplasm of upper-outer quadrant of left female breast (HCC)  06/28/2022 Initial Diagnosis   Primary malignant neoplasm of 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  The report date is July 05, 2022. Negative genetic testing on the Multi-cancer panel.  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  BLM c.3136G>A (p.Gly1046Ser), EGFR c.61G>A (p.Ala21Thr) and PDGFRA c.50G>T (p.Gly17Val) VUS identified.  The report date is July 11, 2022.  The STAT Breast cancer panel offered by [facility] includes sequencing and rearrangement analysis for the following 9 genes:  ATM, BRCA1, BRCA2, CDH1, CHEK2, PALB2, PTEN, STK11 and TP53.   The Multi-Cancer + RNA Panel offered by [facility] includes sequencing and/or deletion/duplication analysis of the following 70 genes:  AIP*, ALK, APC*, ATM*, AXIN2*, BAP1*, BARD1*, BLM*, BMPR1A*, BRCA1*, BRCA2*, BRIP1*, CDC73*, CDH1*, CDK4, CDKN1B*, CDKN2A, CHEK2*, CTNNA1*, DICER1*, EPCAM (del/dup only), EGFR, FH*, FLCN*, GREM1  (promoter dup only), HOXB13, KIT, LZTR1, MAX*, MBD4, MEN1*, MET, MITF, MLH1*, MSH2*, MSH3*, MSH6*, MUTYH*, NF1*, NF2*, NTHL1*, PALB2*, PDGFRA, PMS2*, POLD1*, POLE*, POT1*, PRKAR1A*, PTCH1*, PTEN*, RAD51C*, RAD51D*, RB1*, RET, SDHA* (sequencing only), SDHAF2*, SDHB*, SDHC*, SDHD*, SMAD4*, SMARCA4*, SMARCB1*, SMARCE1*, STK11*, SUFU*, TMEM127*, TP53*, TSC1*, TSC2*, VHL*. RNA analysis is performed for * genes.    07/20/2022 -  Chemotherapy   Patient is on Treatment Plan : BREAST ADJUVANT DOSE DENSE AC q14d / PACLitaxel q7d       CURRENT THERAPY: She will be starting radiation  INTERVAL HISTORY:  Nicole Maddox 42 y.o. female returns for f/u prior to treatment.   The patient, with a history of breast cancer, is currently undergoing radiation therapy and reports a sensation of heaviness and engorgement in the breast, as well as numbness in the surgical area. She describes the breast as feeling 'heavy' and 'engorged,' and notes numbness in the surgical area.  The patient is also experiencing irregular menstrual cycles and vaginal dryness, which she attributes to chemotherapy-induced temporary menopause. The patient is considering returning to work and expresses concern about potential side effects from a proposed oral chemotherapy regimen (Xeloda).  Rest of the pertinent 10 point ROS reviewed and negative  Patient Active Problem List   Diagnosis Date Noted   Port-A-Cath in place 07/20/2022   Genetic testing 07/07/2022   Family history of stomach cancer 06/29/2022   Malignant neoplasm of upper-outer quadrant of left female breast (HCC) 06/27/2022   Seborrheic dermatitis of scalp 02/25/2018    is allergic to adriamycin [doxorubicin].  MEDICAL HISTORY: Past Medical History:  Diagnosis Date   Breast cancer (HCC) 06/23/2022   Family history of adverse reaction  to anesthesia    mother with h/o slow to wake   Family history of stomach cancer    GERD (gastroesophageal reflux disease)     Hyperthyroidism    due to Graves' disease   Multinodular thyroid    Thyroid disease     SURGICAL HISTORY: Past Surgical History:  Procedure Laterality Date   BREAST BIOPSY Left 06/23/2022   [place] LT BREAST BX W LOC DEV 1ST LESION IMG BX SPEC US GUIDE 06/23/2022 [facility]   BREAST BIOPSY Left 01/02/2023   [place] LT RADIOACTIVE SEED LOC 01/02/2023 [facility]MO[facility]   BREAST BIOPSY  01/02/2023   MM RT RADIOACTIVE SEED LOC MAMMO GUIDE 01/02/2023 [facility]   BREAST LUMPECTOMY WITH RADIOACTIVE SEED AND SENTINEL LYMPH NODE BIOPSY Left 01/04/2023   Procedure: LEFT BREAST LUMPECTOMY WITH RADIOACTIVE SEED AND SENTINEL LYMPH NODE BIOPSY;  Surgeon: Griselda Miner, MD;  Location: [facility] OR;  Service: General;  Laterality: Left;  PEC BLOCK   BREAST LUMPECTOMY WITH RADIOACTIVE SEED LOCALIZATION Right 01/04/2023   Procedure: RIGHT BREAST LUMPECTOMY WITH RADIOACTIVE SEED LOCALIZATION;  Surgeon: Griselda Miner, MD;  Location: [facility] OR;  Service: General;  Laterality: Right;   BREAST REDUCTION WITH MASTOPEXY Bilateral 01/04/2023   Procedure: BREAST REDUCTION WITH MASTOPEXY;  Surgeon: Peggye Form, DO;  Location: [facility] OR;  Service: Plastics;  Laterality: Bilateral;   PORT-A-CATH REMOVAL N/A 03/01/2023   Procedure: REMOVAL PORT-A-CATH;  Surgeon: Griselda Miner, MD;  Location: [facility];  Service: General;  Laterality: N/A;   PORTACATH PLACEMENT Right 07/19/2022   Procedure: INSERTION PORT-A-CATH;  Surgeon: Griselda Miner, MD;  Location: [facility];  Service: General;  Laterality: Right;  60 MIN ROOM 8   WISDOM TOOTH EXTRACTION      SOCIAL HISTORY: Social History   Socioeconomic History   Marital status: Single    Spouse name: Not on file   Number of children: Not on file   Years of education: Not on file   Highest education level: Not on file  Occupational History   Not on file  Tobacco Use   Smoking status: Never   Smokeless tobacco: Never  Vaping Use   Vaping  status: Never Used  Substance and Sexual Activity   Alcohol use: Yes    Comment: socially   Drug use: Never   Sexual activity: Not Currently    Partners: Male    Birth control/protection: Condom    Comment: intercourse age 23, more than 6 sexual parters,   Other Topics Concern   Not on file  Social History Narrative   Not on file   Social Determinants of Health   Financial Resource Strain: Low Risk  (07/04/2022)   Overall Financial Resource Strain (CARDIA)    Difficulty of Paying Living Expenses: Not very hard  Food Insecurity: No Food Insecurity (06/28/2022)   Hunger Vital Sign    Worried About Running Out of Food in the Last Year: Never true    Ran Out of Food in the Last Year: Never true  Transportation Needs: No Transportation Needs (06/28/2022)   PRAPARE - Administrator, Civil Service (Medical): No    Lack of Transportation (Non-Medical): No  Physical Activity: Not on file  Stress: Not on file  Social Connections: Unknown (01/09/2023)   Received from [facility]   Social Connections    Frequency of Communication with Friends and Family: Not asked    Frequency of Social Gatherings with Friends and Family: Not  asked  Intimate Partner Violence: Unknown (01/09/2023)   Received from [facility]   Intimate Partner Violence    Fear of Current or Ex-Partner: Not asked    Emotionally Abused: Not asked    Physically Abused: Not asked    Sexually Abused: Not asked    FAMILY HISTORY: Family History  Problem Relation Age of Onset   Heart attack Father 11   Stomach cancer Maternal Aunt        dx > 50   Throat cancer Maternal Uncle        dx 80s   Stomach cancer Maternal Uncle        dx > 50   Heart Problems Maternal Grandmother    Breast cancer Cousin        mother's maternal first cousin    Review of Systems  Constitutional:  Positive for fatigue. Negative for appetite change, chills, fever and unexpected weight change.  HENT:   Negative for hearing loss,  lump/mass and trouble swallowing.   Eyes:  Negative for eye problems and icterus.  Respiratory:  Negative for chest tightness, cough and shortness of breath.   Cardiovascular:  Negative for chest pain, leg swelling and palpitations.  Gastrointestinal:  Negative for abdominal distention, abdominal pain, constipation, diarrhea, nausea and vomiting.  Endocrine: Positive for hot flashes.  Genitourinary:  Negative for difficulty urinating.   Musculoskeletal:  Negative for arthralgias.  Skin:  Negative for itching and rash.  Neurological:  Negative for dizziness, extremity weakness, headaches and numbness.  Hematological:  Negative for adenopathy. Does not bruise/bleed easily.  Psychiatric/Behavioral:  Positive for sleep disturbance. Negative for depression. The patient is not nervous/anxious.      PHYSICAL EXAMINATION  Vitals:   03/16/23 1123  BP: (!) 121/56  Pulse: 74  Resp: 14  Temp: 97.7 F (36.5 C)  SpO2: 100%   Physical Exam Constitutional:      Appearance: Normal appearance.  Cardiovascular:     Rate and Rhythm: Normal rate and regular rhythm.  Pulmonary:     Effort: Pulmonary effort is normal.     Breath sounds: Normal breath sounds.  Chest:     Comments: Left breast with ongoing rad changes. No palpable masses or regional adenopathy. Abdominal:     General: Abdomen is flat.     Palpations: Abdomen is soft.  Musculoskeletal:        General: Normal range of motion.     Cervical back: Normal range of motion and neck supple. No rigidity.  Lymphadenopathy:     Cervical: No cervical adenopathy.  Skin:    General: Skin is warm and dry.  Neurological:     General: No focal deficit present.     Mental Status: She is alert.    LABORATORY DATA:  CBC    Component Value Date/Time   WBC 6.4 12/28/2022 1440   RBC 4.47 12/28/2022 1440   HGB 12.1 12/28/2022 1440   HGB 10.8 (L) 11/30/2022 0758   HCT 38.0 12/28/2022 1440   PLT 283 12/28/2022 1440   PLT 294 11/30/2022  0758   MCV 85.0 12/28/2022 1440   MCH 27.1 12/28/2022 1440   MCHC 31.8 12/28/2022 1440   RDW 18.6 (H) 12/28/2022 1440   LYMPHSABS 1.7 11/30/2022 0758   MONOABS 0.2 11/30/2022 0758   EOSABS 0.2 11/30/2022 0758   BASOSABS 0.1 11/30/2022 0758    CMP     Component Value Date/Time   NA 136 12/28/2022 1440   K 3.7 12/28/2022 1440  CL 101 12/28/2022 1440   CO2 27 12/28/2022 1440   GLUCOSE 79 12/28/2022 1440   BUN 5 (L) 12/28/2022 1440   CREATININE 0.69 12/28/2022 1440   CREATININE 0.72 11/30/2022 0758   CALCIUM 9.0 12/28/2022 1440   PROT 6.6 11/30/2022 0758   ALBUMIN 3.8 11/30/2022 0758   AST 17 11/30/2022 0758   ALT 18 11/30/2022 0758   ALKPHOS 62 11/30/2022 0758   BILITOT 0.2 (L) 11/30/2022 0758   GFRNONAA >60 12/28/2022 1440   GFRNONAA >60 11/30/2022 0758    PENDING LABS:   RADIOGRAPHIC STUDIES:  No results found.   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  She received neoadjuvant AC-T.  She tolerated chemotherapy very well.  Final surgery showed residual IDC with focal sarcomatoid changes measuring 1.6 cm grade 3, high-grade DCIS, negative margins, no lymph node involvement no evidence of lymphovascular or perineural invasion in the primary tumor.  I have reviewed the mechanism of action, adverse effects of capecitabine including but not limited to fatigue, nausea, vomiting, diarrhea, hand-foot syndrome, coronary vasospasm and severe toxicity in  patients with DPD deficiency.  She is interested in proceeding with adjuvant radiation followed by capecitabine. No clear role for antiestrogens but we will discuss about this again.  Breast Cancer Post-Surgical and Radiation Changes Reports of breast heaviness and numbness. Currently undergoing radiation therapy. - Reassured that these symptoms are expected and will improve over time.  Chemotherapy Plan Discussed starting Xeloda (oral chemotherapy) after completion of radiation therapy. - Plan to start Xeloda in the second week of November, with a regimen of two weeks on, one week off for six cycles. - Follow-up every three weeks once treatment starts.  Vaginal Dryness Likely induced by chemotherapy causing a temporary menopause-like state. - Suggested use of over-the-counter lubrication or coconut oil. - If not improved, will consider other interventions such as pelvic rehabilitation.  Breast Changes Noted concern about changes in the breast. - Reassured that changes are likely due to ongoing radiation therapy and will be monitored closely.  General Health Maintenance - Encouraged to resume normal activities such as getting nails done. - Follow-up in the second week of November to start chemotherapy.   All questions were answered. The patient knows to call the clinic with any problems, questions or concerns. We can certainly see the patient much sooner if necessary.  Total encounter time:30 minutes*in face-to-face visit time, chart review, lab review, care coordination, order entry, and documentation of the encounter time.  *Total Encounter Time as defined by the Centers for Medicare and Medicaid Services includes, in addition to the face-to-face time of a patient visit (documented in the note above) non-face-to-face time: obtaining and reviewing outside history, ordering and reviewing medications, tests or procedures, care coordination (communications with other health care  professionals or caregivers) and documentation in the medical record.

## 2023-03-17 ENCOUNTER — Other Ambulatory Visit: Payer: Self-pay

## 2023-03-22 ENCOUNTER — Telehealth: Payer: Self-pay | Admitting: Hematology and Oncology

## 2023-03-22 NOTE — Telephone Encounter (Signed)
Per [facility] LOS on 10/11 patient is aware of scheduled appointment times/dates

## 2023-03-23 ENCOUNTER — Other Ambulatory Visit: Payer: Self-pay

## 2023-03-29 ENCOUNTER — Encounter: Payer: Self-pay | Admitting: *Deleted

## 2023-03-31 ENCOUNTER — Other Ambulatory Visit: Payer: Self-pay

## 2023-04-02 ENCOUNTER — Encounter: Payer: Self-pay | Admitting: Hematology and Oncology

## 2023-04-17 ENCOUNTER — Other Ambulatory Visit (HOSPITAL_COMMUNITY): Payer: Self-pay

## 2023-04-17 ENCOUNTER — Inpatient Hospital Stay: Payer: Managed Care, Other (non HMO)

## 2023-04-17 ENCOUNTER — Telehealth: Payer: Self-pay | Admitting: Pharmacy Technician

## 2023-04-17 ENCOUNTER — Encounter: Payer: Self-pay | Admitting: Hematology and Oncology

## 2023-04-17 ENCOUNTER — Telehealth: Payer: Self-pay

## 2023-04-17 ENCOUNTER — Inpatient Hospital Stay: Payer: Managed Care, Other (non HMO) | Attending: Hematology and Oncology | Admitting: Hematology and Oncology

## 2023-04-17 VITALS — BP 125/55 | HR 84 | Temp 97.5°F | Resp 16 | Wt 187.4 lb

## 2023-04-17 DIAGNOSIS — Z170 Estrogen receptor positive status [ER+]: Secondary | ICD-10-CM | POA: Insufficient documentation

## 2023-04-17 DIAGNOSIS — Z923 Personal history of irradiation: Secondary | ICD-10-CM | POA: Diagnosis not present

## 2023-04-17 DIAGNOSIS — Z803 Family history of malignant neoplasm of breast: Secondary | ICD-10-CM | POA: Diagnosis not present

## 2023-04-17 DIAGNOSIS — C50412 Malignant neoplasm of upper-outer quadrant of left female breast: Secondary | ICD-10-CM | POA: Insufficient documentation

## 2023-04-17 DIAGNOSIS — R635 Abnormal weight gain: Secondary | ICD-10-CM | POA: Diagnosis not present

## 2023-04-17 LAB — CMP (CANCER CENTER ONLY)
ALT: 27 U/L (ref 0–44)
AST: 21 U/L (ref 15–41)
Albumin: 4.4 g/dL (ref 3.5–5.0)
Alkaline Phosphatase: 68 U/L (ref 38–126)
Anion gap: 7 (ref 5–15)
BUN: 6 mg/dL (ref 6–20)
CO2: 29 mmol/L (ref 22–32)
Calcium: 9.3 mg/dL (ref 8.9–10.3)
Chloride: 104 mmol/L (ref 98–111)
Creatinine: 0.69 mg/dL (ref 0.44–1.00)
GFR, Estimated: >60 mL/min (ref >60)
Glucose, Bld: 86 mg/dL (ref 70–99)
Potassium: 3.6 mmol/L (ref 3.5–5.1)
Sodium: 140 mmol/L (ref 135–145)
Total Bilirubin: 0.2 mg/dL (ref <1.2)
Total Protein: 7.5 g/dL (ref 6.5–8.1)

## 2023-04-17 LAB — CBC WITH DIFFERENTIAL/PLATELET
Abs Immature Granulocytes: 0.02 10*3/uL (ref 0.00–0.07)
Basophils Absolute: 0 10*3/uL (ref 0.0–0.1)
Basophils Relative: 1 %
Eosinophils Absolute: 0.3 10*3/uL (ref 0.0–0.5)
Eosinophils Relative: 7 %
HCT: 34.9 % — ABNORMAL LOW (ref 36.0–46.0)
Hemoglobin: 11 g/dL — ABNORMAL LOW (ref 12.0–15.0)
Immature Granulocytes: 0 %
Lymphocytes Relative: 31 %
Lymphs Abs: 1.5 10*3/uL (ref 0.7–4.0)
MCH: 24.1 pg — ABNORMAL LOW (ref 26.0–34.0)
MCHC: 31.5 g/dL (ref 30.0–36.0)
MCV: 76.5 fL — ABNORMAL LOW (ref 80.0–100.0)
Monocytes Absolute: 0.4 10*3/uL (ref 0.1–1.0)
Monocytes Relative: 8 %
Neutro Abs: 2.6 10*3/uL (ref 1.7–7.7)
Neutrophils Relative %: 53 %
Platelets: 303 10*3/uL (ref 150–400)
RBC: 4.56 MIL/uL (ref 3.87–5.11)
RDW: 16.4 % — ABNORMAL HIGH (ref 11.5–15.5)
WBC: 4.8 10*3/uL (ref 4.0–10.5)
nRBC: 0 % (ref 0.0–0.2)

## 2023-04-17 MED ORDER — CAPECITABINE 500 MG PO TABS: 1000.0000 mg/m2 | ORAL_TABLET | Freq: Two times a day (BID) | ORAL | 5 refills | Status: DC

## 2023-04-17 NOTE — Progress Notes (Signed)
[facility] Cancer Follow up:    Nicole Caffey, MD [address] [place] [address]   DIAGNOSIS:  Cancer Staging  Malignant neoplasm of upper-outer quadrant of left female breast [facility]) Staging form: Breast, AJCC 8th Edition - Clinical stage from 06/28/2022: Stage IB (cT1c, cN0(f), cM0, G3, ER+, PR-, HER2-) - Signed by Ronny Bacon, PA-C on 06/28/2022 Stage prefix: Initial diagnosis Method of lymph node assessment: Core biopsy Histologic grading system: 3 grade system   SUMMARY OF ONCOLOGIC HISTORY: Oncology History  Malignant neoplasm of upper-outer quadrant of left female breast (HCC)  06/28/2022 Initial Diagnosis   Primary malignant neoplasm of 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  The report date is July 05, 2022. Negative genetic testing on the Multi-cancer panel.  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  BLM c.3136G>A (p.Gly1046Ser), EGFR c.61G>A (p.Ala21Thr) and PDGFRA c.50G>T (p.Gly17Val) VUS identified.  The report date is July 11, 2022.  The STAT Breast cancer panel offered by [facility] includes sequencing and rearrangement analysis for the following 9 genes:  ATM, BRCA1, BRCA2, CDH1, CHEK2, PALB2, PTEN, STK11 and TP53.   The Multi-Cancer + RNA Panel offered by [facility] includes sequencing and/or deletion/duplication analysis of the following 70 genes:  AIP*, ALK, APC*, ATM*, AXIN2*, BAP1*, BARD1*, BLM*, BMPR1A*, BRCA1*, BRCA2*, BRIP1*, CDC73*, CDH1*, CDK4, CDKN1B*, CDKN2A, CHEK2*, CTNNA1*, DICER1*, EPCAM (del/dup only), EGFR, FH*, FLCN*, GREM1  (promoter dup only), HOXB13, KIT, LZTR1, MAX*, MBD4, MEN1*, MET, MITF, MLH1*, MSH2*, MSH3*, MSH6*, MUTYH*, NF1*, NF2*, NTHL1*, PALB2*, PDGFRA, PMS2*, POLD1*, POLE*, POT1*, PRKAR1A*, PTCH1*, PTEN*, RAD51C*, RAD51D*, RB1*, RET, SDHA* (sequencing only), SDHAF2*, SDHB*, SDHC*, SDHD*, SMAD4*, SMARCA4*, SMARCB1*, SMARCE1*, STK11*, SUFU*, TMEM127*, TP53*, TSC1*, TSC2*, VHL*. RNA analysis is performed for * genes.    07/20/2022 -  Chemotherapy   Patient is on Treatment Plan : BREAST ADJUVANT DOSE DENSE AC q14d / PACLitaxel q7d       CURRENT THERAPY: She will be starting radiation  INTERVAL HISTORY:  Nicole Maddox 42 y.o. female returns for f/u.  Discussed the use of AI scribe software for clinical note transcription with the patient, who gave verbal consent to proceed.  History of Present Illness    The patient, with a history of breast cancer, recently completed radiation therapy about two weeks ago. She also had an upper respiratory infection, which she describes as a sinus infection, but without any post-nasal drip. She experienced loss of voice and a sore throat during this time. The patient also mentions feeling better after the radiation therapy, with the exception of some darkening in the underarms and some peeling. She also expresses concerns about weight gain, which she attributes to the chemotherapy inducing a temporary menopause. The patient is eager to return to work and is concerned about managing the side effects of the new medication, Xeloda, while working.  Rest of the pertinent 10 point ROS reviewed and negative  Patient Active Problem List   Diagnosis Date Noted   Port-A-Cath in place 07/20/2022   Genetic testing 07/07/2022   Family history of stomach cancer 06/29/2022   Malignant neoplasm of  upper-outer quadrant of left female breast (HCC) 06/27/2022   Seborrheic dermatitis of scalp 02/25/2018    is allergic to adriamycin [doxorubicin].  MEDICAL HISTORY: Past Medical  History:  Diagnosis Date   Breast cancer (HCC) 06/23/2022   Family history of adverse reaction to anesthesia    mother with h/o slow to wake   Family history of stomach cancer    GERD (gastroesophageal reflux disease)    Hyperthyroidism    due to Graves' disease   Multinodular thyroid    Thyroid disease     SURGICAL HISTORY: Past Surgical History:  Procedure Laterality Date   BREAST BIOPSY Left 06/23/2022   [place] LT BREAST BX W LOC DEV 1ST LESION IMG BX SPEC US GUIDE 06/23/2022 [facility]   BREAST BIOPSY Left 01/02/2023   [place] LT RADIOACTIVE SEED LOC 01/02/2023 [facility]   BREAST BIOPSY  01/02/2023   MM RT RADIOACTIVE SEED LOC MAMMO GUIDE 01/02/2023 [facility]   BREAST LUMPECTOMY WITH RADIOACTIVE SEED AND SENTINEL LYMPH NODE BIOPSY Left 01/04/2023   Procedure: LEFT BREAST LUMPECTOMY WITH RADIOACTIVE SEED AND SENTINEL LYMPH NODE BIOPSY;  Surgeon: Griselda Miner, MD;  Location: [facility] OR;  Service: General;  Laterality: Left;  PEC BLOCK   BREAST LUMPECTOMY WITH RADIOACTIVE SEED LOCALIZATION Right 01/04/2023   Procedure: RIGHT BREAST LUMPECTOMY WITH RADIOACTIVE SEED LOCALIZATION;  Surgeon: Griselda Miner, MD;  Location: [facility] OR;  Service: General;  Laterality: Right;   BREAST REDUCTION WITH MASTOPEXY Bilateral 01/04/2023   Procedure: BREAST REDUCTION WITH MASTOPEXY;  Surgeon: Peggye Form, DO;  Location: [facility] OR;  Service: Plastics;  Laterality: Bilateral;   PORT-A-CATH REMOVAL N/A 03/01/2023   Procedure: REMOVAL PORT-A-CATH;  Surgeon: Griselda Miner, MD;  Location: [facility];  Service: General;  Laterality: N/A;   PORTACATH PLACEMENT Right 07/19/2022   Procedure: INSERTION PORT-A-CATH;  Surgeon: Griselda Miner, MD;  Location: [facility];  Service: General;  Laterality: Right;  60 MIN ROOM 8   WISDOM TOOTH EXTRACTION      SOCIAL HISTORY: Social History   Socioeconomic History   Marital status: Single    Spouse name: Not on file   Number of  children: Not on file   Years of education: Not on file   Highest education level: Not on file  Occupational History   Not on file  Tobacco Use   Smoking status: Never   Smokeless tobacco: Never  Vaping Use   Vaping status: Never Used  Substance and Sexual Activity   Alcohol use: Yes    Comment: socially   Drug use: Never   Sexual activity: Not Currently    Partners: Male    Birth control/protection: Condom    Comment: intercourse age 74, more than 6 sexual parters,   Other Topics Concern   Not on file  Social History Narrative   Not on file   Social Determinants of Health   Financial Resource Strain: Low Risk  (07/04/2022)   Overall Financial Resource Strain (CARDIA)    Difficulty of Paying Living Expenses: Not very hard  Food Insecurity: No Food Insecurity (06/28/2022)   Hunger Vital Sign    Worried About Running Out of Food in the Last Year: Never true    Ran Out of Food in the Last Year: Never true  Transportation Needs: No Transportation Needs (06/28/2022)   PRAPARE - Administrator, Civil Service (Medical): No    Lack of Transportation (Non-Medical): No  Physical Activity: Not on  file  Stress: Not on file  Social Connections: Unknown (01/09/2023)   Received from [facility]   Social Connections    Frequency of Communication with Friends and Family: Not asked    Frequency of Social Gatherings with Friends and Family: Not asked  Intimate Partner Violence: Unknown (01/09/2023)   Received from [facility]   Intimate Partner Violence    Fear of Current or Ex-Partner: Not asked    Emotionally Abused: Not asked    Physically Abused: Not asked    Sexually Abused: Not asked    FAMILY HISTORY: Family History  Problem Relation Age of Onset   Heart attack Father 66   Stomach cancer Maternal Aunt        dx > 50   Throat cancer Maternal Uncle        dx 31s   Stomach cancer Maternal Uncle        dx > 50   Heart Problems Maternal Grandmother    Breast  cancer Cousin        mother's maternal first cousin    Review of Systems  Constitutional:  Positive for fatigue. Negative for appetite change, chills, fever and unexpected weight change.  HENT:   Negative for hearing loss, lump/mass and trouble swallowing.   Eyes:  Negative for eye problems and icterus.  Respiratory:  Negative for chest tightness, cough and shortness of breath.   Cardiovascular:  Negative for chest pain, leg swelling and palpitations.  Gastrointestinal:  Negative for abdominal distention, abdominal pain, constipation, diarrhea, nausea and vomiting.  Endocrine: Positive for hot flashes.  Genitourinary:  Negative for difficulty urinating.   Musculoskeletal:  Negative for arthralgias.  Skin:  Negative for itching and rash.  Neurological:  Negative for dizziness, extremity weakness, headaches and numbness.  Hematological:  Negative for adenopathy. Does not bruise/bleed easily.  Psychiatric/Behavioral:  Positive for sleep disturbance. Negative for depression. The patient is not nervous/anxious.      PHYSICAL EXAMINATION  Vitals:   04/17/23 1448  BP: (!) 125/55  Pulse: 84  Resp: 16  Temp: (!) 97.5 F (36.4 C)  SpO2: 93%   PE deferred in lieu of counseling  LABORATORY DATA:  CBC    Component Value Date/Time   WBC 6.4 12/28/2022 1440   RBC 4.47 12/28/2022 1440   HGB 12.1 12/28/2022 1440   HGB 10.8 (L) 11/30/2022 0758   HCT 38.0 12/28/2022 1440   PLT 283 12/28/2022 1440   PLT 294 11/30/2022 0758   MCV 85.0 12/28/2022 1440   MCH 27.1 12/28/2022 1440   MCHC 31.8 12/28/2022 1440   RDW 18.6 (H) 12/28/2022 1440   LYMPHSABS 1.7 11/30/2022 0758   MONOABS 0.2 11/30/2022 0758   EOSABS 0.2 11/30/2022 0758   BASOSABS 0.1 11/30/2022 0758    CMP     Component Value Date/Time   NA 136 12/28/2022 1440   K 3.7 12/28/2022 1440   CL 101 12/28/2022 1440   CO2 27 12/28/2022 1440   GLUCOSE 79 12/28/2022 1440   BUN 5 (L) 12/28/2022 1440   CREATININE 0.69 12/28/2022  1440   CREATININE 0.72 11/30/2022 0758   CALCIUM 9.0 12/28/2022 1440   PROT 6.6 11/30/2022 0758   ALBUMIN 3.8 11/30/2022 0758   AST 17 11/30/2022 0758   ALT 18 11/30/2022 0758   ALKPHOS 62 11/30/2022 0758   BILITOT 0.2 (L) 11/30/2022 0758   GFRNONAA >60 12/28/2022 1440   GFRNONAA >60 11/30/2022 0758    PENDING LABS:   RADIOGRAPHIC STUDIES:  No results found.   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  She received neoadjuvant AC-T.  She tolerated chemotherapy very well.  Final surgery showed residual IDC with focal sarcomatoid changes measuring 1.6 cm grade 3, high-grade DCIS, negative margins, no lymph node involvement no evidence of lymphovascular or perineural invasion in the primary tumor. She is interested in proceeding with adjuvant capecitabine. No clear role for antiestrogens.  Breast Cancer Completed radiation therapy two weeks ago. ER/PR negative.  Discussed starting Xeloda (oral chemotherapy)  I have reviewed the mechanism of action, adverse effects of capecitabine including but not limited to fatigue, nausea, vomiting, diarrhea, hand-foot syndrome, coronary vasospasm and severe toxicity in patients with DPD deficiency.   -Start Xeloda, 4 pills in the morning and 4 pills in the evening for two weeks, followed by one week off. -Perform baseline labs today before starting Xeloda. -Follow up in three weeks to monitor side effects and response to  therapy.  Upper Respiratory Infection Recent sinus infection with sore throat and voice loss. -Continue current treatment and monitor symptoms.  Weight Gain Patient reports weight gain, likely due to chemotherapy-induced temporary menopause. -Encourage continued exercise and healthy eating habits.  Return to Work Patient expresses desire to return to work. -Update work accommodations as needed to allow for medical appointments and treatment.  Total encounter time:30 minutes*in face-to-face visit time, chart review, lab review, care coordination, order entry, and documentation of the encounter time.  *Total Encounter Time as defined by the Centers for Medicare and Medicaid Services includes, in addition to the face-to-face time of a patient visit (documented in the note above) non-face-to-face time: obtaining and reviewing outside history, ordering and reviewing medications, tests or procedures, care coordination (communications with other health care professionals or caregivers) and documentation in the medical record.

## 2023-04-17 NOTE — Telephone Encounter (Signed)
Oral Oncology Patient Advocate Encounter   Received notification that prior authorization for capecitabine is required.   PA submitted on 04/18/23 Key [id number] Status is pending     Jinger Neighbors, CPhT-Adv Oncology Pharmacy Patient Advocate [facility] Direct Number: [phone]  Fax: [phone]

## 2023-04-18 ENCOUNTER — Other Ambulatory Visit (HOSPITAL_COMMUNITY): Payer: Self-pay

## 2023-04-18 NOTE — Telephone Encounter (Addendum)
Oral Oncology Pharmacist Encounter  Received new prescription for Xeloda (Capecitabine) for the treatment of newly diagnosed triple negative breast cancer, planned duration of 6-8 cycles or unacceptability toxicity.  Labs from 04/17/2023 assessed, CMP WNL with Scr 0.69 (CrCl 145.43). CBC: ANC 2.6 K/uL, Platelets 303 K/uL. Hgb 11.0. Prescription dose and frequency assessed for appropriateness.  Current medication list in Epic reviewed, DDIs with Xeloda identified: - Category C DDI with Xeloda and Methimazole. Myelosuppressive agents may enhance the neutropenic effect of antithyroid agents. - Category C DDI with Xeloda and Omeprazole. PPIs may diminish the therapeutic effect of Capecitabine. Will discuss with patient about switching to famotidine.  - Category B DDI with Xeloda and Loratadine. May increase patient's QT interval. No adjustment needed at this time.  Evaluated chart and no patient barriers to medication adherence noted.   Patient agreement for treatment documented in MD note on 04/17/2023.  Prescription has been e-scribed to the [facility] for benefits analysis and approval.  Oral Oncology Clinic will continue to follow for insurance authorization, copayment issues, initial counseling and start date.  Francetta Found, PharmD Candidate Class of 2025  04/18/2023 8:35 AM Oral Oncology Clinic [phone]

## 2023-04-18 NOTE — Telephone Encounter (Signed)
Oral Oncology Patient Advocate Encounter  Prior Authorization for capecitabine has been approved.    PA# [id number] Effective dates: 04/18/23 through 04/17/24  Patient must fill at [facility].    Jinger Neighbors, CPhT-Adv Oncology Pharmacy Patient Advocate [facility] Direct Number: [phone]  Fax: [phone]

## 2023-04-26 ENCOUNTER — Inpatient Hospital Stay: Payer: Managed Care, Other (non HMO)

## 2023-04-26 ENCOUNTER — Other Ambulatory Visit: Payer: Self-pay | Admitting: Hematology and Oncology

## 2023-04-26 ENCOUNTER — Telehealth: Payer: Self-pay | Admitting: *Deleted

## 2023-04-26 DIAGNOSIS — C50412 Malignant neoplasm of upper-outer quadrant of left female breast: Secondary | ICD-10-CM | POA: Diagnosis not present

## 2023-04-26 LAB — CBC WITH DIFFERENTIAL/PLATELET
Abs Immature Granulocytes: 0.01 10*3/uL (ref 0.00–0.07)
Basophils Absolute: 0 10*3/uL (ref 0.0–0.1)
Basophils Relative: 1 %
Eosinophils Absolute: 0.4 10*3/uL (ref 0.0–0.5)
Eosinophils Relative: 7 %
HCT: 36.6 % (ref 36.0–46.0)
Hemoglobin: 11.3 g/dL — ABNORMAL LOW (ref 12.0–15.0)
Immature Granulocytes: 0 %
Lymphocytes Relative: 31 %
Lymphs Abs: 1.7 10*3/uL (ref 0.7–4.0)
MCH: 24 pg — ABNORMAL LOW (ref 26.0–34.0)
MCHC: 30.9 g/dL (ref 30.0–36.0)
MCV: 77.7 fL — ABNORMAL LOW (ref 80.0–100.0)
Monocytes Absolute: 0.3 10*3/uL (ref 0.1–1.0)
Monocytes Relative: 6 %
Neutro Abs: 3.1 10*3/uL (ref 1.7–7.7)
Neutrophils Relative %: 55 %
Platelets: 298 10*3/uL (ref 150–400)
RBC: 4.71 MIL/uL (ref 3.87–5.11)
RDW: 17 % — ABNORMAL HIGH (ref 11.5–15.5)
WBC: 5.6 10*3/uL (ref 4.0–10.5)
nRBC: 0 % (ref 0.0–0.2)

## 2023-04-26 NOTE — Progress Notes (Signed)
DPD def evaluation requested.  Nicole Maddox

## 2023-04-26 NOTE — Telephone Encounter (Signed)
This  RN spoke with pt per request for genetic predisposition for 5 fu toxicity lab- and scheduled pt for lab.  Pt verbalized understanding that test takes up to 7 days to result and she is to not start on the xeloda until we call her with the results.  Pt appreciative of plan.

## 2023-04-26 NOTE — Telephone Encounter (Addendum)
Oral Chemotherapy Pharmacist Encounter  I spoke with patient for overview of: Xeloda (capecitabine) for the  treatment of newly diagnosed triple negative breast cancer, planned duration until disease progression or unacceptable drug toxicity.  Counseled patient on administration, dosing, side effects, monitoring, drug-food interactions, safe handling, storage, and disposal.  Patient will take Xeloda 500mg  tablets, 4 tablets (2000 mg) by mouth in AM and 4 tabs (2000 mg) by mouth in PM, within 30 minutes of finishing meals, for 14 days on, 7 days off, repeated every 21 days.  Xeloda start date: Pending  MD has ordered DPD enzyme testing to determine if patient has DPD deficiency which would prevent the usage of Xeloda. RN is reaching out to patient.   Adverse effects include but are not limited to: fatigue, decreased blood counts, GI upset, diarrhea, mouth sores, and hand-foot syndrome.  Patient has anti-emetic on hand and knows to take it if nausea develops.   Patient will obtain anti diarrheal and alert the office of 4 or more loose stools above baseline.  Patient offered referral to diclofenac prophylaxis hand-foot syndrome research project. Patient agreed to referral.    Reviewed with patient importance of keeping a medication schedule and plan for any missed doses. No barriers to medication adherence identified.  Patient was informed of interaction between Xeloda and Omeprazole and planned to stop Omeprazole and take Famotidine if experiencing gastric reflux.  Medication reconciliation performed and medication/allergy list updated.  Patient is getting medication delivered by [facility].  All questions answered.  Patient voiced understanding and appreciation.   Medication education handout placed in mail for patient. Patient knows to call the office with questions or concerns. Oral Chemotherapy Clinic phone number provided to patient.   Francetta Found, PharmD Candidate Class  of 2025  01/20/2020   1:33 PM Oral Oncology Clinic [phone]

## 2023-04-27 ENCOUNTER — Encounter: Payer: Self-pay | Admitting: Hematology and Oncology

## 2023-04-30 ENCOUNTER — Telehealth: Payer: Self-pay | Admitting: *Deleted

## 2023-04-30 NOTE — Telephone Encounter (Signed)
Today received Francesco Sor accommodation form signed by provider.  Successfully faxed.  E-mailed to patient.

## 2023-05-01 ENCOUNTER — Encounter: Payer: Self-pay | Admitting: Hematology and Oncology

## 2023-05-13 ENCOUNTER — Encounter: Payer: Self-pay | Admitting: Hematology and Oncology

## 2023-05-14 ENCOUNTER — Other Ambulatory Visit: Payer: Self-pay

## 2023-05-14 DIAGNOSIS — Z170 Estrogen receptor positive status [ER+]: Secondary | ICD-10-CM

## 2023-05-14 NOTE — Telephone Encounter (Signed)
Oral Oncology Pharmacist Encounter  Patients DPD deficiency test came back negative and patient notified. She states she will start the xeloda today, 05/14/23.   Bethel Born, PharmD Hematology/Oncology Clinical Pharmacist [facility] [phone]

## 2023-05-14 NOTE — Progress Notes (Signed)
Research Note - Consent Authorization   Study Name: Evaluation of Topical Diclofenac Use on the Incidence and Severity of Hand Foot Syndrome in Patients with Breast and Gastrointestinal Malignancies Taking Capecitabine   IRB# [id number]   A summary of the study was presented to the patient, including potential risks and benefits from study participation, alternatives treatment options available other than study participation, how their confidentiality will be maintained, and who to contact if they had questions regarding their rights as a study participant or if they experience an injury associated with their participation. They were told that participation is completely voluntary and choosing not to participate would not impact care they would otherwise have access to. They were also told that even if they choose to participate, they can withdraw their consent to participate at any time in the future. Any questions the patient asked were addressed by a member of the research team.   After having all of their questions answered, the patient agreed to participate in the study by confirming their willingness to consent to participation verbally over the phone on 05/14/2023 at 3:16 PM.    Jerry Caras, PharmD PGY2 Oncology Pharmacy Resident  Principal Investigator  [phone]

## 2023-05-15 ENCOUNTER — Other Ambulatory Visit (HOSPITAL_COMMUNITY): Payer: Self-pay

## 2023-05-15 ENCOUNTER — Encounter: Payer: Self-pay | Admitting: Hematology and Oncology

## 2023-05-15 ENCOUNTER — Other Ambulatory Visit: Payer: Self-pay

## 2023-05-15 MED ORDER — DICLOFENAC SODIUM 1 % EX GEL
CUTANEOUS | 0 refills | Status: AC
Start: 2023-05-15 — End: ?
  Filled 2023-05-15: qty 400, 30d supply, fill #0
  Filled 2023-05-15: qty 400, 84d supply, fill #0

## 2023-05-16 LAB — DPD 5-FLUOROURACIL TOXICITY

## 2023-05-22 ENCOUNTER — Inpatient Hospital Stay: Payer: Managed Care, Other (non HMO) | Attending: Hematology and Oncology | Admitting: Hematology and Oncology

## 2023-05-22 VITALS — BP 104/50 | HR 87 | Temp 98.1°F | Resp 16 | Wt 192.6 lb

## 2023-05-22 DIAGNOSIS — Z803 Family history of malignant neoplasm of breast: Secondary | ICD-10-CM | POA: Insufficient documentation

## 2023-05-22 DIAGNOSIS — Z170 Estrogen receptor positive status [ER+]: Secondary | ICD-10-CM | POA: Diagnosis not present

## 2023-05-22 DIAGNOSIS — M898X9 Other specified disorders of bone, unspecified site: Secondary | ICD-10-CM | POA: Diagnosis not present

## 2023-05-22 DIAGNOSIS — C50412 Malignant neoplasm of upper-outer quadrant of left female breast: Secondary | ICD-10-CM | POA: Insufficient documentation

## 2023-05-22 DIAGNOSIS — Z808 Family history of malignant neoplasm of other organs or systems: Secondary | ICD-10-CM | POA: Diagnosis not present

## 2023-05-22 DIAGNOSIS — Z800 Family history of malignant neoplasm of digestive organs: Secondary | ICD-10-CM | POA: Diagnosis not present

## 2023-05-22 NOTE — Progress Notes (Signed)
[facility] Cancer Follow up:    Nicole Caffey, MD [address] [place] [address]   DIAGNOSIS:  Cancer Staging  Malignant neoplasm of upper-outer quadrant of left female breast [facility]) Staging form: Breast, AJCC 8th Edition - Clinical stage from 06/28/2022: Stage IB (cT1c, cN0(f), cM0, G3, ER+, PR-, HER2-) - Signed by Ronny Bacon, PA-C on 06/28/2022 Stage prefix: Initial diagnosis Method of lymph node assessment: Core biopsy Histologic grading system: 3 grade system   SUMMARY OF ONCOLOGIC HISTORY: Oncology History  Malignant neoplasm of upper-outer quadrant of left female breast (HCC)  06/28/2022 Initial Diagnosis   Primary malignant neoplasm of 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  The report date is July 05, 2022. Negative genetic testing on the Multi-cancer panel.  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  BLM c.3136G>A (p.Gly1046Ser), EGFR c.61G>A (p.Ala21Thr) and PDGFRA c.50G>T (p.Gly17Val) VUS identified.  The report date is July 11, 2022.  The STAT Breast cancer panel offered by [facility] includes sequencing and rearrangement analysis for the following 9 genes:  ATM, BRCA1, BRCA2, CDH1, CHEK2, PALB2, PTEN, STK11 and TP53.   The Multi-Cancer + RNA Panel offered by [facility] includes sequencing and/or deletion/duplication analysis of the following 70 genes:  AIP*, ALK, APC*, ATM*, AXIN2*, BAP1*, BARD1*, BLM*, BMPR1A*, BRCA1*, BRCA2*, BRIP1*, CDC73*, CDH1*, CDK4, CDKN1B*, CDKN2A, CHEK2*, CTNNA1*, DICER1*, EPCAM (del/dup only), EGFR, FH*, FLCN*, GREM1  (promoter dup only), HOXB13, KIT, LZTR1, MAX*, MBD4, MEN1*, MET, MITF, MLH1*, MSH2*, MSH3*, MSH6*, MUTYH*, NF1*, NF2*, NTHL1*, PALB2*, PDGFRA, PMS2*, POLD1*, POLE*, POT1*, PRKAR1A*, PTCH1*, PTEN*, RAD51C*, RAD51D*, RB1*, RET, SDHA* (sequencing only), SDHAF2*, SDHB*, SDHC*, SDHD*, SMAD4*, SMARCA4*, SMARCB1*, SMARCE1*, STK11*, SUFU*, TMEM127*, TP53*, TSC1*, TSC2*, VHL*. RNA analysis is performed for * genes.    07/20/2022 - 11/30/2022 Chemotherapy   Patient is on Treatment Plan : BREAST ADJUVANT DOSE DENSE AC q14d / PACLitaxel q7d     04/17/2023 -  Chemotherapy   Patient is on Treatment Plan : BREAST Capecitabine q21d       CURRENT THERAPY: She will be starting radiation  INTERVAL HISTORY:  Nicole Maddox 42 y.o. female returns for f/u.  Discussed the use of AI scribe software for clinical note transcription with the patient, who gave verbal consent to proceed.  History of Present Illness    The patient, with a history of breast cancer, presents with new onset bone pain, bilateral leg tingling, nausea, and night sweats. These symptoms started a week ago, shortly after initiating capecitabine (Xeloda) for breast cancer treatment. The bone pain is described as aching and is more pronounced when lying flat. The patient also reports feeling hot at night, which she likens to a hot flash. The bilateral leg tingling is accompanied by a sensation of heaviness. The patient also experiences nausea, but it is not severe. She denies any rash on her hands or feet and any diarrhea, but she reports that her stools have returned to being small and pebble-like. The patient also reports feeling exhausted, a symptom that had previously eased but has now returned with intensity.  Rest of the pertinent 10  point ROS reviewed and negative  Patient Active Problem List   Diagnosis Date Noted   Port-A-Cath in place 07/20/2022   Genetic testing 07/07/2022   Family history of stomach cancer 06/29/2022   Malignant  neoplasm of upper-outer quadrant of left female breast (HCC) 06/27/2022   Seborrheic dermatitis of scalp 02/25/2018    is allergic to adriamycin [doxorubicin].  MEDICAL HISTORY: Past Medical History:  Diagnosis Date   Breast cancer (HCC) 06/23/2022   Family history of adverse reaction to anesthesia    mother with h/o slow to wake   Family history of stomach cancer    GERD (gastroesophageal reflux disease)    Hyperthyroidism    due to Graves' disease   Multinodular thyroid    Thyroid disease     SURGICAL HISTORY: Past Surgical History:  Procedure Laterality Date   BREAST BIOPSY Left 06/23/2022   [place] LT BREAST BX W LOC DEV 1ST LESION IMG BX SPEC US GUIDE 06/23/2022 [facility]   BREAST BIOPSY Left 01/02/2023   [place] LT RADIOACTIVE SEED LOC 01/02/2023 [facility]   BREAST BIOPSY  01/02/2023   MM RT RADIOACTIVE SEED LOC MAMMO GUIDE 01/02/2023 [facility]   BREAST LUMPECTOMY WITH RADIOACTIVE SEED AND SENTINEL LYMPH NODE BIOPSY Left 01/04/2023   Procedure: LEFT BREAST LUMPECTOMY WITH RADIOACTIVE SEED AND SENTINEL LYMPH NODE BIOPSY;  Surgeon: Griselda Miner, MD;  Location: [facility] OR;  Service: General;  Laterality: Left;  PEC BLOCK   BREAST LUMPECTOMY WITH RADIOACTIVE SEED LOCALIZATION Right 01/04/2023   Procedure: RIGHT BREAST LUMPECTOMY WITH RADIOACTIVE SEED LOCALIZATION;  Surgeon: Griselda Miner, MD;  Location: [facility] OR;  Service: General;  Laterality: Right;   BREAST REDUCTION WITH MASTOPEXY Bilateral 01/04/2023   Procedure: BREAST REDUCTION WITH MASTOPEXY;  Surgeon: Peggye Form, DO;  Location: [facility] OR;  Service: Plastics;  Laterality: Bilateral;   PORT-A-CATH REMOVAL N/A 03/01/2023   Procedure: REMOVAL PORT-A-CATH;  Surgeon: Griselda Miner, MD;  Location: [facility];  Service: General;  Laterality: N/A;   PORTACATH PLACEMENT Right 07/19/2022   Procedure: INSERTION PORT-A-CATH;  Surgeon: Griselda Miner, MD;  Location: [facility];  Service: General;   Laterality: Right;  60 MIN ROOM 8   WISDOM TOOTH EXTRACTION      SOCIAL HISTORY: Social History   Socioeconomic History   Marital status: Single    Spouse name: Not on file   Number of children: Not on file   Years of education: Not on file   Highest education level: Not on file  Occupational History   Not on file  Tobacco Use   Smoking status: Never   Smokeless tobacco: Never  Vaping Use   Vaping status: Never Used  Substance and Sexual Activity   Alcohol use: Yes    Comment: socially   Drug use: Never   Sexual activity: Not Currently    Partners: Male    Birth control/protection: Condom    Comment: intercourse age 10, more than 6 sexual parters,   Other Topics Concern   Not on file  Social History Narrative   Not on file   Social Drivers of Health   Financial Resource Strain: Low Risk  (07/04/2022)   Overall Financial Resource Strain (CARDIA)    Difficulty of Paying Living Expenses: Not very hard  Food Insecurity: No Food Insecurity (06/28/2022)   Hunger Vital Sign    Worried About Running Out of Food in the Last Year: Never true    Ran Out of Food  in the Last Year: Never true  Transportation Needs: No Transportation Needs (06/28/2022)   PRAPARE - Administrator, Civil Service (Medical): No    Lack of Transportation (Non-Medical): No  Physical Activity: Not on file  Stress: Not on file  Social Connections: Unknown (01/09/2023)   Received from [facility]   Social Connections    Frequency of Communication with Friends and Family: Not asked    Frequency of Social Gatherings with Friends and Family: Not asked  Intimate Partner Violence: Unknown (01/09/2023)   Received from [facility]   Intimate Partner Violence    Fear of Current or Ex-Partner: Not asked    Emotionally Abused: Not asked    Physically Abused: Not asked    Sexually Abused: Not asked    FAMILY HISTORY: Family History  Problem Relation Age of Onset   Heart attack Father 19    Stomach cancer Maternal Aunt        dx > 50   Throat cancer Maternal Uncle        dx 53s   Stomach cancer Maternal Uncle        dx > 50   Heart Problems Maternal Grandmother    Breast cancer Cousin        mother's maternal first cousin     PHYSICAL EXAMINATION  Vitals:   05/22/23 1210  BP: (!) 104/50  Pulse: 87  Resp: 16  Temp: 98.1 F (36.7 C)  SpO2: 100%   No HFS CTA bilaterally Heart: RRR Abdomen: Soft, non tender, non distended,  No LE edema.  LABORATORY DATA:  CBC    Component Value Date/Time   WBC 5.6 04/26/2023 1530   RBC 4.71 04/26/2023 1530   HGB 11.3 (L) 04/26/2023 1530   HGB 10.8 (L) 11/30/2022 0758   HCT 36.6 04/26/2023 1530   PLT 298 04/26/2023 1530   PLT 294 11/30/2022 0758   MCV 77.7 (L) 04/26/2023 1530   MCH 24.0 (L) 04/26/2023 1530   MCHC 30.9 04/26/2023 1530   RDW 17.0 (H) 04/26/2023 1530   LYMPHSABS 1.7 04/26/2023 1530   MONOABS 0.3 04/26/2023 1530   EOSABS 0.4 04/26/2023 1530   BASOSABS 0.0 04/26/2023 1530    CMP     Component Value Date/Time   NA 140 04/17/2023 1524   K 3.6 04/17/2023 1524   CL 104 04/17/2023 1524   CO2 29 04/17/2023 1524   GLUCOSE 86 04/17/2023 1524   BUN 6 04/17/2023 1524   CREATININE 0.69 04/17/2023 1524   CALCIUM 9.3 04/17/2023 1524   PROT 7.5 04/17/2023 1524   ALBUMIN 4.4 04/17/2023 1524   AST 21 04/17/2023 1524   ALT 27 04/17/2023 1524   ALKPHOS 68 04/17/2023 1524   BILITOT 0.2 04/17/2023 1524   GFRNONAA >60 04/17/2023 1524    PENDING LABS:   RADIOGRAPHIC STUDIES:  No results found.   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  She received neoadjuvant AC-T.  She tolerated chemotherapy very well.  Final surgery showed residual IDC with  focal sarcomatoid changes measuring 1.6 cm grade 3, high-grade DCIS, negative margins, no lymph node involvement no evidence of lymphovascular or perineural invasion in the primary tumor. She is now on adj capecitabine.  Breast Cancer on Capecitabine (Xeloda) New onset of bone pain, tingling in legs, night sweats, and nausea since starting medication. No diarrhea or hand-foot syndrome. Constipation noted with small, pebble-like stools. -Continue current regimen of Capecitabine 4 tablets BID for 2 weeks on, 1 week off. -Monitor symptoms during week off medication to assess for correlation. -Consider dose reduction if symptoms worsen or become intolerable. -Plan to repeat complementary DNA testing every 6 months to monitor for tumor DNA in blood using signatera or Guardant reveal.  Fatigue Persistent fatigue, worsened since starting Capecitabine. -Continue to monitor and assess at next visit. -Consider work modifications or part-time work schedule.  Patient Advocacy Expressed interest in becoming a patient advocate. -Provide information on patient advocacy and mentor training programs.  Follow-up in 4 weeks during week off Capecitabine.  Total encounter time:30 minutes*in face-to-face visit time, chart review, lab review, care coordination, order entry, and documentation of the encounter time.  *Total Encounter Time as defined by the Centers for Medicare and Medicaid Services includes, in addition to the face-to-face time of a patient visit (documented in the note above) non-face-to-face time: obtaining and reviewing outside history, ordering and reviewing medications, tests or procedures, care coordination  (communications with other health care professionals or caregivers) and documentation in the medical record.

## 2023-05-24 ENCOUNTER — Other Ambulatory Visit: Payer: Self-pay

## 2023-05-25 ENCOUNTER — Other Ambulatory Visit: Payer: Self-pay

## 2023-05-31 ENCOUNTER — Encounter: Payer: Self-pay | Admitting: Hematology and Oncology

## 2023-06-01 ENCOUNTER — Telehealth: Payer: Self-pay

## 2023-06-01 NOTE — Telephone Encounter (Signed)
 Per md orders entered for [employer]ant Reveal and all supported documents faxed to [phone]. Faxed confirmation was received.

## 2023-06-14 ENCOUNTER — Encounter: Payer: Self-pay | Admitting: *Deleted

## 2023-06-15 ENCOUNTER — Inpatient Hospital Stay: Payer: Managed Care, Other (non HMO) | Attending: Hematology and Oncology | Admitting: Adult Health

## 2023-06-15 ENCOUNTER — Telehealth: Payer: Self-pay | Admitting: *Deleted

## 2023-06-15 DIAGNOSIS — C50412 Malignant neoplasm of upper-outer quadrant of left female breast: Secondary | ICD-10-CM | POA: Insufficient documentation

## 2023-06-15 DIAGNOSIS — Z9221 Personal history of antineoplastic chemotherapy: Secondary | ICD-10-CM | POA: Diagnosis not present

## 2023-06-15 DIAGNOSIS — R109 Unspecified abdominal pain: Secondary | ICD-10-CM | POA: Insufficient documentation

## 2023-06-15 DIAGNOSIS — R194 Change in bowel habit: Secondary | ICD-10-CM | POA: Insufficient documentation

## 2023-06-15 DIAGNOSIS — Z79620 Long term (current) use of immunosuppressive biologic: Secondary | ICD-10-CM | POA: Insufficient documentation

## 2023-06-15 DIAGNOSIS — Z803 Family history of malignant neoplasm of breast: Secondary | ICD-10-CM | POA: Insufficient documentation

## 2023-06-15 DIAGNOSIS — Z923 Personal history of irradiation: Secondary | ICD-10-CM | POA: Insufficient documentation

## 2023-06-15 DIAGNOSIS — Z1722 Progesterone receptor negative status: Secondary | ICD-10-CM | POA: Insufficient documentation

## 2023-06-15 DIAGNOSIS — Z170 Estrogen receptor positive status [ER+]: Secondary | ICD-10-CM | POA: Diagnosis not present

## 2023-06-15 DIAGNOSIS — R635 Abnormal weight gain: Secondary | ICD-10-CM | POA: Diagnosis not present

## 2023-06-15 DIAGNOSIS — Z808 Family history of malignant neoplasm of other organs or systems: Secondary | ICD-10-CM | POA: Diagnosis not present

## 2023-06-15 DIAGNOSIS — Z1732 Human epidermal growth factor receptor 2 negative status: Secondary | ICD-10-CM | POA: Insufficient documentation

## 2023-06-15 DIAGNOSIS — N926 Irregular menstruation, unspecified: Secondary | ICD-10-CM | POA: Insufficient documentation

## 2023-06-15 DIAGNOSIS — Z79899 Other long term (current) drug therapy: Secondary | ICD-10-CM | POA: Diagnosis not present

## 2023-06-15 DIAGNOSIS — Z800 Family history of malignant neoplasm of digestive organs: Secondary | ICD-10-CM | POA: Diagnosis not present

## 2023-06-15 DIAGNOSIS — R5383 Other fatigue: Secondary | ICD-10-CM | POA: Diagnosis not present

## 2023-06-15 NOTE — Progress Notes (Signed)
 SURVIVORSHIP VISIT:  BRIEF ONCOLOGIC HISTORY:  Oncology History  Malignant neoplasm of upper-outer quadrant of left female breast (HCC)  06/28/2022 Initial Diagnosis   Primary malignant neoplasm of upper inner quadrant of left female breast (HCC)   06/28/2022 Cancer Staging   Staging form: Breast, AJCC 8th Edition - Clinical stage from 06/28/2022: Stage IB (cT1c, cN0(f), cM0, G3, ER+, PR-, HER2-) - Signed by Lanell Donald Stagger, PA-C on 06/28/2022 Stage prefix: Initial diagnosis Method of lymph node assessment: Core biopsy Histologic grading system: 3 grade system   07/11/2022 Genetic Testing   Negative genetic testing on the 9 gene STAT panel.  The report date is July 05, 2022. Negative genetic testing on the Multi-cancer panel.  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  BLM c.3136G>A (p.Gly1046Ser), EGFR c.61G>A (p.Ala21Thr) and PDGFRA c.50G>T (p.Gly17Val) VUS identified.  The report date is July 11, 2022.  The STAT Breast cancer panel offered by [facility] includes sequencing and rearrangement analysis for the following 9 genes:  ATM, BRCA1, BRCA2, CDH1, CHEK2, PALB2, PTEN, STK11 and TP53.   The Multi-Cancer + RNA Panel offered by [facility] includes sequencing and/or deletion/duplication analysis of the following 70 genes:  AIP*, ALK, APC*, ATM*, AXIN2*, BAP1*, BARD1*, BLM*, BMPR1A*, BRCA1*, BRCA2*, BRIP1*, CDC73*, CDH1*, CDK4, CDKN1B*, CDKN2A, CHEK2*, CTNNA1*, DICER1*, EPCAM (del/dup only), EGFR, FH*, FLCN*, GREM1 (promoter dup only), HOXB13, KIT, LZTR1, MAX*, MBD4, MEN1*, MET, MITF, MLH1*, MSH2*, MSH3*, MSH6*, MUTYH*, NF1*, NF2*, NTHL1*, PALB2*, PDGFRA, PMS2*, POLD1*, POLE*, POT1*, PRKAR1A*, PTCH1*, PTEN*, RAD51C*, RAD51D*, RB1*, RET, SDHA* (sequencing only), SDHAF2*, SDHB*, SDHC*, SDHD*, SMAD4*, SMARCA4*, SMARCB1*, SMARCE1*, STK11*, SUFU*, TMEM127*, TP53*, TSC1*, TSC2*, VHL*. RNA analysis is performed for * genes.    07/20/2022 - 11/30/2022 Chemotherapy   Patient is on Treatment Plan : BREAST ADJUVANT DOSE  DENSE AC q14d / PACLitaxel  q7d     04/17/2023 -  Chemotherapy   Patient is on Treatment Plan : BREAST Capecitabine  q21d      - 03/29/2023 Radiation Therapy   Left breast: 40.05 Gy in 15 treatments "Boost": 10 cGy in 5 treatments      INTERVAL HISTORY:  Nicole Maddox to review her survivorship care plan detailing her treatment course for breast cancer, as well as monitoring long-term side effects of that treatment, education regarding health maintenance, screening, and overall wellness and health promotion.     Overall, Nicole Maddox reports feeling quite well.  She is taking capecitabine  as recommended 2 weeks on and 1 week off every 21 days and is tolerating this moderately well.  REVIEW OF SYSTEMS:  Review of Systems  Constitutional:  Negative for appetite change, chills, fatigue, fever and unexpected weight change.  HENT:   Negative for hearing loss, lump/mass and trouble swallowing.   Eyes:  Negative for eye problems and icterus.  Respiratory:  Negative for chest tightness, cough and shortness of breath.   Cardiovascular:  Negative for chest pain, leg swelling and palpitations.  Gastrointestinal:  Negative for abdominal distention, abdominal pain, constipation, diarrhea, nausea and vomiting.  Endocrine: Negative for hot flashes.  Genitourinary:  Negative for difficulty urinating.   Musculoskeletal:  Negative for arthralgias.  Skin:  Negative for itching and rash.  Neurological:  Negative for dizziness, extremity weakness, headaches and numbness.  Hematological:  Negative for adenopathy. Does not bruise/bleed easily.  Psychiatric/Behavioral:  Negative for depression. The patient is not nervous/anxious.   Breast: Denies any new nodularity, masses, tenderness, nipple changes, or nipple discharge.       PAST MEDICAL/SURGICAL HISTORY:  Past Medical History:  Diagnosis  Date   Breast cancer (HCC) 06/23/2022   Family history of adverse reaction to anesthesia    mother with h/o slow to wake    Family history of stomach cancer    GERD (gastroesophageal reflux disease)    Hyperthyroidism    due to Graves' disease   Multinodular thyroid     Thyroid  disease    Past Surgical History:  Procedure Laterality Date   BREAST BIOPSY Left 06/23/2022   US  LT BREAST BX W LOC DEV 1ST LESION IMG BX SPEC US  GUIDE 06/23/2022 [facility]   BREAST BIOPSY Left 01/02/2023   US  LT RADIOACTIVE SEED LOC 01/02/2023 [facility]   BREAST BIOPSY  01/02/2023   MM RT RADIOACTIVE SEED LOC MAMMO GUIDE 01/02/2023 [facility]   BREAST LUMPECTOMY WITH RADIOACTIVE SEED AND SENTINEL LYMPH NODE BIOPSY Left 01/04/2023   Procedure: LEFT BREAST LUMPECTOMY WITH RADIOACTIVE SEED AND SENTINEL LYMPH NODE BIOPSY;  Surgeon: Curvin Deward MOULD, MD;  Location: [facility] OR;  Service: General;  Laterality: Left;  PEC BLOCK   BREAST LUMPECTOMY WITH RADIOACTIVE SEED LOCALIZATION Right 01/04/2023   Procedure: RIGHT BREAST LUMPECTOMY WITH RADIOACTIVE SEED LOCALIZATION;  Surgeon: Curvin Deward MOULD, MD;  Location: [facility] OR;  Service: General;  Laterality: Right;   BREAST REDUCTION WITH MASTOPEXY Bilateral 01/04/2023   Procedure: BREAST REDUCTION WITH MASTOPEXY;  Surgeon: Lowery Estefana RAMAN, DO;  Location: [facility] OR;  Service: Plastics;  Laterality: Bilateral;   PORT-A-CATH REMOVAL N/A 03/01/2023   Procedure: REMOVAL PORT-A-CATH;  Surgeon: Curvin Deward MOULD, MD;  Location: [facility];  Service: General;  Laterality: N/A;   PORTACATH PLACEMENT Right 07/19/2022   Procedure: INSERTION PORT-A-CATH;  Surgeon: Curvin Deward MOULD, MD;  Location: [facility];  Service: General;  Laterality: Right;  60 MIN ROOM 8   WISDOM TOOTH EXTRACTION       ALLERGIES:  Allergies  Allergen Reactions   Adriamycin  [Doxorubicin ] Other (See Comments)    Patient c/o of lower back pain and right arm pain. See progress note from 07/20/22     CURRENT MEDICATIONS:  Outpatient Encounter Medications as of 06/15/2023  Medication Sig   capecitabine   (XELODA ) 500 MG tablet Take 4 tablets (2,000 mg total) by mouth 2 (two) times daily after a meal. Take on days 1-14. Repeat every 21 days.   COD LIVER OIL PO Take by mouth daily.   Cyanocobalamin (VITAMIN B-12 PO) Take by mouth daily.   DENTA 5000 PLUS 1.1 % CREA dental cream Place 1 Application onto teeth daily.   diclofenac  Sodium (VOLTAREN ) 1 % GEL Research Patient: Apply 0.5 grams (1 fingertip) to each hand and each foot twice daily for up to 12 weeks   folic acid  (FOLVITE ) 1 MG tablet Take 1 mg by mouth daily.   loratadine (CLARITIN) 10 MG tablet Take 10 mg by mouth daily.   metFORMIN (GLUCOPHAGE) 500 MG tablet    methimazole (TAPAZOLE) 5 MG tablet Take 15 mg by mouth 2 (two) times daily.   omeprazole (PRILOSEC) 40 MG capsule Take 40 mg by mouth daily.   oxyCODONE  (ROXICODONE ) 5 MG immediate release tablet Take 1 tablet (5 mg total) by mouth every 6 (six) hours as needed for severe pain.   oxyCODONE  (ROXICODONE ) 5 MG immediate release tablet Take 1 tablet (5 mg total) by mouth every 6 (six) hours as needed for severe pain.   No facility-administered encounter medications on file as of 06/15/2023.     ONCOLOGIC FAMILY HISTORY:  Family History  Problem Relation Age  of Onset   Heart attack Father 21   Stomach cancer Maternal Aunt        dx > 50   Throat cancer Maternal Uncle        dx 40s   Stomach cancer Maternal Uncle        dx > 50   Heart Problems Maternal Grandmother    Breast cancer Cousin        mother's maternal first cousin     SOCIAL HISTORY:  Social History   Socioeconomic History   Marital status: Single    Spouse name: Not on file   Number of children: Not on file   Years of education: Not on file   Highest education level: Not on file  Occupational History   Not on file  Tobacco Use   Smoking status: Never   Smokeless tobacco: Never  Vaping Use   Vaping status: Never Used  Substance and Sexual Activity   Alcohol  use: Yes    Comment: socially   Drug  use: Never   Sexual activity: Not Currently    Partners: Male    Birth control/protection: Condom    Comment: intercourse age 20, more than 6 sexual parters,   Other Topics Concern   Not on file  Social History Narrative   Not on file   Social Drivers of Health   Financial Resource Strain: Low Risk  (07/04/2022)   Overall Financial Resource Strain (CARDIA)    Difficulty of Paying Living Expenses: Not very hard  Food Insecurity: No Food Insecurity (06/28/2022)   Hunger Vital Sign    Worried About Running Out of Food in the Last Year: Never true    Ran Out of Food in the Last Year: Never true  Transportation Needs: No Transportation Needs (06/28/2022)   PRAPARE - Administrator, Civil Service (Medical): No    Lack of Transportation (Non-Medical): No  Physical Activity: Not on file  Stress: Not on file  Social Connections: Unknown (01/09/2023)   Received from [facility]   Social Connections    Frequency of Communication with Friends and Family: Not asked    Frequency of Social Gatherings with Friends and Family: Not asked  Intimate Partner Violence: Unknown (01/09/2023)   Received [facility]   Intimate Partner Violence    Fear of Current or Ex-Partner: Not asked    Emotionally Abused: Not asked    Physically Abused: Not asked    Sexually Abused: Not asked     OBSERVATIONS/OBJECTIVE:  Patient sounds well and is in no apparent distress.  Mood and behavior are normal.  Speech is normal.  Skin visualized without rash or lesion.  Her breathing is nonlabored.  LABORATORY DATA:  None for this visit.  DIAGNOSTIC IMAGING:  None for this visit.      ASSESSMENT AND PLAN:  Nicole Maddox is a pleasant 43 y.o. female with Stage IB left breast invasive ductal carcinoma, ER-/PR-/HER2-, diagnosed in 06/2022, treated with neoadjuvant chemotherapy, lumpectomy, adjuvant radiation therapy, and adjuvant capecitabine . She presents to the Survivorship Clinic for our initial meeting  and routine follow-up post-completion of treatment for breast cancer.    1. Stage 1B left breast cancer:  Nicole Maddox is continuing to recover from definitive treatment for breast cancer. She will follow-up with her medical oncologist, Dr.  Loretha next week with history and physical exam per surveillance protocol. Thus far, she is tolerating the Capecitabine  well, with minimal side effects. Her mammogram is due 09/2023; orders  placed today.   Today, a comprehensive survivorship care plan and treatment summary was reviewed with the patient today detailing her breast cancer diagnosis, treatment course, potential late/long-term effects of treatment, appropriate follow-up care with recommendations for the future, and patient education resources.  A copy of this summary, along with a letter will be sent to the patient's primary care provider via mail/fax/In Basket message after today's visit.    2. Bone health:    She was given education on specific activities to promote bone health.  3. Cancer screening:  Due to Nicole Maddox's history and her age, she should receive screening for skin cancers, colon cancer, and gynecologic cancers.  The information and recommendations are listed on the patient's comprehensive care plan/treatment summary and were reviewed in detail with the patient.    4. Health maintenance and wellness promotion: Nicole Maddox was encouraged to consume 5-7 servings of fruits and vegetables per day. We reviewed dietary recommendations.  She was also encouraged to engage in moderate to vigorous exercise for 30 minutes per day most days of the week.  She was instructed to limit her alcohol  consumption and continue to abstain from tobacco use.     5. Support services/counseling: It is not uncommon for this period of the patient's cancer care trajectory to be one of many emotions and stressors.   She was given information regarding our available services and encouraged to contact me with any questions or for  help enrolling in any of our support group/programs.    Follow up instructions:    -Return to cancer center 1/15 for regular f/u with Dr. Loretha  -Mammogram due in 09/2023 -She is welcome to return back to the Survivorship Clinic at any time; no additional follow-up needed at this time.  -Consider referral back to survivorship as a long-term survivor for continued surveillance    The patient was provided an opportunity to ask questions and all were answered. The patient agreed with the plan and demonstrated an understanding of the instructions.   The patient was advised to call back or seek an in-person evaluation if the symptoms worsen or if the condition fails to improve as anticipated.   I provided 30 minutes of face-to-face video visit time during this encounter, and > 50% was spent counseling as documented under my assessment & plan.    Morna Kendall, NP 06/15/23 9:25 AM Medical Oncology and Hematology [facility] [address] [place], [place] [address] Tel. [phone]    Fax. [phone]  *Total Encounter Time as defined by the Centers for Medicare and Medicaid Services includes, in addition to the face-to-face time of a patient visit (documented in the note above) non-face-to-face time: obtaining and reviewing outside history, ordering and reviewing medications, tests or procedures, care coordination (communications with other health care professionals or caregivers) and documentation in the medical record.

## 2023-06-15 NOTE — Telephone Encounter (Signed)
Per MD request RN placed call to pt with recent Guardant Reveal results being negative.  No answer, LVM for pt to return call to the office.

## 2023-06-19 ENCOUNTER — Encounter: Payer: Self-pay | Admitting: Adult Health

## 2023-06-19 ENCOUNTER — Encounter: Payer: Self-pay | Admitting: Hematology and Oncology

## 2023-06-20 ENCOUNTER — Inpatient Hospital Stay (HOSPITAL_BASED_OUTPATIENT_CLINIC_OR_DEPARTMENT_OTHER): Payer: Managed Care, Other (non HMO) | Admitting: Hematology and Oncology

## 2023-06-20 ENCOUNTER — Encounter: Payer: Self-pay | Admitting: Hematology and Oncology

## 2023-06-20 ENCOUNTER — Inpatient Hospital Stay: Payer: Managed Care, Other (non HMO)

## 2023-06-20 VITALS — BP 107/56 | HR 81 | Temp 97.9°F | Resp 16 | Wt 190.0 lb

## 2023-06-20 DIAGNOSIS — Z170 Estrogen receptor positive status [ER+]: Secondary | ICD-10-CM

## 2023-06-20 DIAGNOSIS — C50412 Malignant neoplasm of upper-outer quadrant of left female breast: Secondary | ICD-10-CM | POA: Diagnosis not present

## 2023-06-20 LAB — CBC WITH DIFFERENTIAL/PLATELET
Abs Immature Granulocytes: 0.05 10*3/uL (ref 0.00–0.07)
Basophils Absolute: 0 10*3/uL (ref 0.0–0.1)
Basophils Relative: 1 %
Eosinophils Absolute: 0.4 10*3/uL (ref 0.0–0.5)
Eosinophils Relative: 6 %
HCT: 33.4 % — ABNORMAL LOW (ref 36.0–46.0)
Hemoglobin: 10.9 g/dL — ABNORMAL LOW (ref 12.0–15.0)
Immature Granulocytes: 1 %
Lymphocytes Relative: 24 %
Lymphs Abs: 1.6 10*3/uL (ref 0.7–4.0)
MCH: 25.6 pg — ABNORMAL LOW (ref 26.0–34.0)
MCHC: 32.6 g/dL (ref 30.0–36.0)
MCV: 78.4 fL — ABNORMAL LOW (ref 80.0–100.0)
Monocytes Absolute: 0.5 10*3/uL (ref 0.1–1.0)
Monocytes Relative: 7 %
Neutro Abs: 4.1 10*3/uL (ref 1.7–7.7)
Neutrophils Relative %: 61 %
Platelets: 261 10*3/uL (ref 150–400)
RBC: 4.26 MIL/uL (ref 3.87–5.11)
RDW: 25.1 % — ABNORMAL HIGH (ref 11.5–15.5)
WBC: 6.6 10*3/uL (ref 4.0–10.5)
nRBC: 0.3 % — ABNORMAL HIGH (ref 0.0–0.2)

## 2023-06-20 LAB — CMP (CANCER CENTER ONLY)
ALT: 25 U/L (ref 0–44)
AST: 23 U/L (ref 15–41)
Albumin: 4.4 g/dL (ref 3.5–5.0)
Alkaline Phosphatase: 58 U/L (ref 38–126)
Anion gap: 5 (ref 5–15)
BUN: 7 mg/dL (ref 6–20)
CO2: 30 mmol/L (ref 22–32)
Calcium: 9.6 mg/dL (ref 8.9–10.3)
Chloride: 104 mmol/L (ref 98–111)
Creatinine: 0.77 mg/dL (ref 0.44–1.00)
GFR, Estimated: >60 mL/min (ref >60)
Glucose, Bld: 59 mg/dL — ABNORMAL LOW (ref 70–99)
Potassium: 3.9 mmol/L (ref 3.5–5.1)
Sodium: 139 mmol/L (ref 135–145)
Total Bilirubin: 0.4 mg/dL (ref 0.0–1.2)
Total Protein: 6.8 g/dL (ref 6.5–8.1)

## 2023-06-20 LAB — TSH: TSH: 0.457 u[IU]/mL (ref 0.350–4.500)

## 2023-06-20 NOTE — Progress Notes (Signed)
 BRIEF ONCOLOGIC HISTORY:  Oncology History  Malignant neoplasm of upper-outer quadrant of left female breast (HCC)  06/23/2022 Initial Diagnosis   Left breast needle core biopsy at 1:00, 10 cmfn: IDC< grade 3, 1.1 cm, ER 30% positive, weak staining intensity, PR negative, Ki-67 95%, HER2 negative.  Axillary node negative.    06/28/2022 Cancer Staging   Staging form: Breast, AJCC 8th Edition - Clinical stage from 06/28/2022: Stage IB (cT1c, cN0(f), cM0, G3, ER+, PR-, HER2-) - Signed by Bettejane Brownie, PA-C on 06/28/2022 Stage prefix: Initial diagnosis Method of lymph node assessment: Core biopsy Histologic grading system: 3 grade system   07/11/2022 Genetic Testing   Negative genetic testing on the 9 gene STAT panel.  The report date is July 05, 2022. Negative genetic testing on the Multi-cancer panel.  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  BLM c.3136G>A (p.Gly1046Ser), EGFR c.61G>A (p.Ala21Thr) and PDGFRA c.50G>T (p.Gly17Val) VUS identified.  The report date is July 11, 2022.  The STAT Breast cancer panel offered by [facility] includes sequencing and rearrangement analysis for the following 9 genes:  ATM, BRCA1, BRCA2, CDH1, CHEK2, PALB2, PTEN, STK11 and TP53.   The Multi-Cancer + RNA Panel offered by [facility] includes sequencing and/or deletion/duplication analysis of the following 70 genes:  AIP*, ALK, APC*, ATM*, AXIN2*, BAP1*, BARD1*, BLM*, BMPR1A*, BRCA1*, BRCA2*, BRIP1*, CDC73*, CDH1*, CDK4, CDKN1B*, CDKN2A, CHEK2*, CTNNA1*, DICER1*, EPCAM (del/dup only), EGFR, FH*, FLCN*, GREM1 (promoter dup only), HOXB13, KIT, LZTR1, MAX*, MBD4, MEN1*, MET, MITF, MLH1*, MSH2*, MSH3*, MSH6*, MUTYH*, NF1*, NF2*, NTHL1*, PALB2*, PDGFRA, PMS2*, POLD1*, POLE*, POT1*, PRKAR1A*, PTCH1*, PTEN*, RAD51C*, RAD51D*, RB1*, RET, SDHA* (sequencing only), SDHAF2*, SDHB*, SDHC*, SDHD*, SMAD4*, SMARCA4*, SMARCB1*, SMARCE1*, STK11*, SUFU*, TMEM127*, TP53*, TSC1*, TSC2*, VHL*. RNA analysis is performed for * genes.    07/20/2022 -  11/30/2022 Chemotherapy   Patient is on Treatment Plan : BREAST ADJUVANT DOSE DENSE AC q14d / PACLitaxel  q7d     07/21/2022 Procedure   Right breast needle core biopsy upper outer breast x 2: Negative for malignancy, left breast needle core biopsy lower: Negative for malignancy.   08/07/2022 Procedure   2 additional right breast needle core biopsies both negative for malignancy   01/04/2023 Surgery   Left breast lumpectomy: Invasive ductal carcinoma, 1.6 cm, grade 3, margins negative, 6 lymph nodes negative for malignancy.  Repeat prognostic panel: Triple negative breast cancer with a Ki67 of 90%.  Right breast lumpectomy: Negative for carcinoma.  Patient also underwent bilateral mammoplasties and path was negative for carcinoma on both specimen submitted.    - 03/29/2023 Radiation Therapy   Left breast: 40.05 Gy in 15 treatments "Boost": 10 cGy in 5 treatments    04/17/2023 -  Chemotherapy   Patient is on Treatment Plan : BREAST Capecitabine  q21d       INTERVAL HISTORY:  Nicole Maddox is here for follow up.  Discussed the use of AI scribe software for clinical note transcription with the patient, who gave verbal consent to proceed.  History of Present Illness    The patient, with a history of breast cancer, presents with multiple complaints. She reports skeletal pain, particularly when lying down, and heaviness in her legs. She also notes a lack of appetite, yet she has been gaining weight. She has not resumed menstrual cycles since her cancer treatment, but she did have a period in August, September, and October. She also reports intense stomach cramps, darkening of her hands, and a sensation on her tongue. She experiences sharp, intermittent pains in both breasts, which  radiate to the nipples. She also reports increased frequency of bowel movements, but the stool is often small and pellet-like. She also reports fatigue and pain in the area where she was treated for breast cancer. She suspects she  may be experiencing depression due to her ongoing health issues.  Rest of the pertinent 10 point ROS reviewed and neg.  PAST MEDICAL/SURGICAL HISTORY:  Past Medical History:  Diagnosis Date   Breast cancer (HCC) 06/23/2022   Family history of adverse reaction to anesthesia    mother with h/o slow to wake   Family history of stomach cancer    GERD (gastroesophageal reflux disease)    Hyperthyroidism    due to Graves' disease   Multinodular thyroid     Thyroid  disease    Past Surgical History:  Procedure Laterality Date   BREAST BIOPSY Left 06/23/2022   US  LT BREAST BX W LOC DEV 1ST LESION IMG BX SPEC US  GUIDE 06/23/2022 [facility]   BREAST BIOPSY Left 01/02/2023   US  LT RADIOACTIVE SEED LOC 01/02/2023 [facility]   BREAST BIOPSY  01/02/2023   MM RT RADIOACTIVE SEED LOC MAMMO GUIDE 01/02/2023 [facility]   BREAST LUMPECTOMY WITH RADIOACTIVE SEED AND SENTINEL LYMPH NODE BIOPSY Left 01/04/2023   Procedure: LEFT BREAST LUMPECTOMY WITH RADIOACTIVE SEED AND SENTINEL LYMPH NODE BIOPSY;  Surgeon: Caralyn Chandler, MD;  Location: [facility] OR;  Service: General;  Laterality: Left;  PEC BLOCK   BREAST LUMPECTOMY WITH RADIOACTIVE SEED LOCALIZATION Right 01/04/2023   Procedure: RIGHT BREAST LUMPECTOMY WITH RADIOACTIVE SEED LOCALIZATION;  Surgeon: Caralyn Chandler, MD;  Location: [facility];  Service: General;  Laterality: Right;   BREAST REDUCTION WITH MASTOPEXY Bilateral 01/04/2023   Procedure: BREAST REDUCTION WITH MASTOPEXY;  Surgeon: Thornell Flirt, DO;  Location: [facility] OR;  Service: Plastics;  Laterality: Bilateral;   PORT-A-CATH REMOVAL N/A 03/01/2023   Procedure: REMOVAL PORT-A-CATH;  Surgeon: Caralyn Chandler, MD;  Location: [facility];  Service: General;  Laterality: N/A;   PORTACATH PLACEMENT Right 07/19/2022   Procedure: INSERTION PORT-A-CATH;  Surgeon: Caralyn Chandler, MD;  Location: [facility];  Service: General;  Laterality: Right;  60 MIN ROOM 8   WISDOM  TOOTH EXTRACTION       ALLERGIES:  Allergies  Allergen Reactions   Adriamycin  [Doxorubicin ] Other (See Comments)    Patient c/o of lower back pain and right arm pain. See progress note from 07/20/22     CURRENT MEDICATIONS:  Outpatient Encounter Medications as of 06/20/2023  Medication Sig   capecitabine  (XELODA ) 500 MG tablet Take 4 tablets (2,000 mg total) by mouth 2 (two) times daily after a meal. Take on days 1-14. Repeat every 21 days.   COD LIVER OIL PO Take by mouth daily.   Cyanocobalamin (VITAMIN B-12 PO) Take by mouth daily.   DENTA 5000 PLUS 1.1 % CREA dental cream Place 1 Application onto teeth daily.   diclofenac  Sodium (VOLTAREN ) 1 % GEL Research Patient: Apply 0.5 grams (1 fingertip) to each hand and each foot twice daily for up to 12 weeks   folic acid  (FOLVITE ) 1 MG tablet Take 1 mg by mouth daily.   loratadine (CLARITIN) 10 MG tablet Take 10 mg by mouth daily.   metFORMIN (GLUCOPHAGE) 500 MG tablet    methimazole (TAPAZOLE) 5 MG tablet Take 15 mg by mouth 2 (two) times daily.   omeprazole (PRILOSEC) 40 MG capsule Take 40 mg by mouth daily.   No facility-administered encounter medications on  file as of 06/20/2023.     ONCOLOGIC FAMILY HISTORY:  Family History  Problem Relation Age of Onset   Heart attack Father 78   Stomach cancer Maternal Aunt        dx > 50   Throat cancer Maternal Uncle        dx 84s   Stomach cancer Maternal Uncle        dx > 50   Heart Problems Maternal Grandmother    Breast cancer Cousin        mother's maternal first cousin     SOCIAL HISTORY:  Social History   Socioeconomic History   Marital status: Single    Spouse name: Not on file   Number of children: Not on file   Years of education: Not on file   Highest education level: Not on file  Occupational History   Not on file  Tobacco Use   Smoking status: Never   Smokeless tobacco: Never  Vaping Use   Vaping status: Never Used  Substance and Sexual Activity   Alcohol   use: Yes    Comment: socially   Drug use: Never   Sexual activity: Not Currently    Partners: Male    Birth control/protection: Condom    Comment: intercourse age 41, more than 6 sexual parters,   Other Topics Concern   Not on file  Social History Narrative   Not on file   Social Drivers of Health   Financial Resource Strain: Low Risk  (07/04/2022)   Overall Financial Resource Strain (CARDIA)    Difficulty of Paying Living Expenses: Not very hard  Food Insecurity: No Food Insecurity (06/28/2022)   Hunger Vital Sign    Worried About Running Out of Food in the Last Year: Never true    Ran Out of Food in the Last Year: Never true  Transportation Needs: No Transportation Needs (06/28/2022)   PRAPARE - Administrator, Civil Service (Medical): No    Lack of Transportation (Non-Medical): No  Physical Activity: Not on file  Stress: Not on file  Social Connections: Unknown (01/09/2023)   Received from [facility]   Social Connections    Frequency of Communication with Friends and Family: Not asked    Frequency of Social Gatherings with Friends and Family: Not asked  Intimate Partner Violence: Unknown (01/09/2023)   Received from [facility]   Intimate Partner Violence    Fear of Current or Ex-Partner: Not asked    Emotionally Abused: Not asked    Physically Abused: Not asked    Sexually Abused: Not asked    OBSERVATIONS/OBJECTIVE:  Physical Exam Constitutional:      Appearance: Normal appearance.  Cardiovascular:     Rate and Rhythm: Normal rate and regular rhythm.     Pulses: Normal pulses.     Heart sounds: Normal heart sounds.  Pulmonary:     Effort: Pulmonary effort is normal.     Breath sounds: Normal breath sounds.  Musculoskeletal:        General: No swelling. Normal range of motion.     Cervical back: Normal range of motion.  Lymphadenopathy:     Cervical: No cervical adenopathy.  Skin:    General: Skin is warm and dry.  Neurological:     General: No  focal deficit present.     Mental Status: She is alert.      LABORATORY DATA:  None for this visit.  DIAGNOSTIC IMAGING:  None for this visit.  ASSESSMENT AND PLAN:  Nicole Maddox is a pleasant 43 y.o. female with Stage IB left breast invasive ductal carcinoma, ER-/PR-/HER2-, diagnosed in 06/2022, treated with neoadjuvant chemotherapy, lumpectomy, adjuvant radiation therapy, and adjuvant capecitabine .   Breast Cancer Patient is currently on Xeloda  with side effects including skin discoloration, abdominal cramping, and increased stool frequency. Patient also reports fatigue and pain in the treated area. -Continue Xeloda  at current dose. -Refer to physical therapy for assessment and potential lymphedema management. -Check TSH and CMP levels.  Menstrual Irregularities Patient reports cessation of menstrual cycles with intermittent resumption. Symptoms of menopause including weight gain, mood changes, and breast pain are present. -Expect resumption of regular menstrual cycles within a year of stopping chemotherapy.  Weight Gain Patient reports weight gain despite decreased appetite. -Refer to weight loss clinic for consultation.  Work-related Stress Patient reports difficulty concentrating and staying focused at work. -Provide paperwork for short-term leave from work.  Follow-up in 3 weeks.  *Total Encounter Time as defined by the Centers for Medicare and Medicaid Services includes, in addition to the face-to-face time of a patient visit (documented in the note above) non-face-to-face time: obtaining and reviewing outside history, ordering and reviewing medications, tests or procedures, care coordination (communications with other health care professionals or caregivers) and documentation in the medical record.

## 2023-06-21 ENCOUNTER — Other Ambulatory Visit: Payer: Self-pay

## 2023-06-24 NOTE — Therapy (Signed)
OUTPATIENT PHYSICAL THERAPY  UPPER EXTREMITY ONCOLOGY EVALUATION  Patient Name: Nicole Maddox MRN: [id number] DOB:18-Dec-1980, 43 y.o., female Today's Date: 06/25/2023  END OF SESSION:  PT End of Session - 06/25/23 0951     Visit Number 1    Number of Visits 9    Date for PT Re-Evaluation 08/06/23    Authorization Type none    PT Start Time 0903    PT Stop Time 0951    PT Time Calculation (min) 48 min    Activity Tolerance Patient tolerated treatment well    Behavior During Therapy [facility] for tasks assessed/performed             Past Medical History:  Diagnosis Date   Breast cancer (HCC) 06/23/2022   Family history of adverse reaction to anesthesia    mother with h/o slow to wake   Family history of stomach cancer    GERD (gastroesophageal reflux disease)    Hyperthyroidism    due to Graves' disease   Multinodular thyroid    Thyroid disease    Past Surgical History:  Procedure Laterality Date   BREAST BIOPSY Left 06/23/2022   [place] LT BREAST BX W LOC DEV 1ST LESION IMG BX SPEC US GUIDE 06/23/2022 [facility]   BREAST BIOPSY Left 01/02/2023   [place] LT RADIOACTIVE SEED LOC 01/02/2023 [facility]   BREAST BIOPSY  01/02/2023   MM RT RADIOACTIVE SEED LOC MAMMO GUIDE 01/02/2023 [facility]   BREAST LUMPECTOMY WITH RADIOACTIVE SEED AND SENTINEL LYMPH NODE BIOPSY Left 01/04/2023   Procedure: LEFT BREAST LUMPECTOMY WITH RADIOACTIVE SEED AND SENTINEL LYMPH NODE BIOPSY;  Surgeon: Griselda Miner, MD;  Location: [facility] OR;  Service: General;  Laterality: Left;  PEC BLOCK   BREAST LUMPECTOMY WITH RADIOACTIVE SEED LOCALIZATION Right 01/04/2023   Procedure: RIGHT BREAST LUMPECTOMY WITH RADIOACTIVE SEED LOCALIZATION;  Surgeon: Griselda Miner, MD;  Location: [facility] OR;  Service: General;  Laterality: Right;   BREAST REDUCTION WITH MASTOPEXY Bilateral 01/04/2023   Procedure: BREAST REDUCTION WITH MASTOPEXY;  Surgeon: Peggye Form, DO;  Location: [facility] OR;  Service: Plastics;   Laterality: Bilateral;   PORT-A-CATH REMOVAL N/A 03/01/2023   Procedure: REMOVAL PORT-A-CATH;  Surgeon: Griselda Miner, MD;  Location: [facility];  Service: General;  Laterality: N/A;   PORTACATH PLACEMENT Right 07/19/2022   Procedure: INSERTION PORT-A-CATH;  Surgeon: Griselda Miner, MD;  Location: [facility];  Service: General;  Laterality: Right;  60 MIN ROOM 8   WISDOM TOOTH EXTRACTION     Patient Active Problem List   Diagnosis Date Noted   Port-A-Cath in place 07/20/2022   Genetic testing 07/07/2022   Family history of stomach cancer 06/29/2022   Malignant neoplasm of upper-outer quadrant of left female breast (HCC) 06/27/2022   Seborrheic dermatitis of scalp 02/25/2018    PCP: Dr. Aviva Signs  REFERRING PROVIDER: Dr. Rachel Moulds  REFERRING DIAG:  Diagnosis  C50.412,Z17.0 (ICD-10-CM) - Malignant neoplasm of upper-outer quadrant of left breast in female, estrogen receptor positive (HCC)    THERAPY DIAG:  Malignant neoplasm of upper-outer quadrant of left female breast, unspecified estrogen receptor status (HCC)  Abnormal posture  Aftercare following surgery for neoplasm  At risk for lymphedema  Localized edema  ONSET DATE: 06/23/2022  Rationale for Evaluation and Treatment: Rehabilitation  SUBJECTIVE:  SUBJECTIVE STATEMENT: I am having swelling in the breast and it hurts to raise my arm.  It has been going on since after radiation.  I have the compression surgery bra.    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  Completed chemotherapy. Lt lumpectomy with 6 negative nodes. Completed radiation 03/29/23. On Capecitabine (Xeloda) chemo.   PAIN:  Are you having pain? Yes.  I am aware that I have a left side of my body but not my right.  It is not  painful to touch.  The arm is also not painful just tight  PRECAUTIONS: Lt lymphedema risk  RED FLAGS: None   WEIGHT BEARING RESTRICTIONS: No  FALLS:  Has patient fallen in last 6 months? No  LIVING ENVIRONMENT: Lives with: lives alone Lives in: House/apartment  OCCUPATION: on leave from mental health therapist  LEISURE: reading, shopping, light exercise with the trainer 2x per week   HAND DOMINANCE: right   PRIOR LEVEL OF FUNCTION: Independent  PATIENT GOALS: learn what to do for the upper body.     OBJECTIVE: Note: Objective measures were completed at Evaluation unless otherwise noted.  COGNITION: Overall cognitive status: Within functional limits for tasks assessed   PALPATION: Increased fibrosis along reduction incisions inferior and line to nipple as well as axillary incision. Rt incisions are slightly firm but not painful  OBSERVATIONS / OTHER ASSESSMENTS: Lt breast appears around the same size as the Rt but after radiation should be a bit smaller.  No enlarged pores  POSTURE: WNL  UPPER EXTREMITY AROM/PROM:  A/PROM baseline RIGHT   eval   Shoulder extension 51 55  Shoulder flexion 150 155  Shoulder abduction 180 165  Shoulder internal rotation 60 60  Shoulder external rotation 100 80    (Blank rows = not tested)  A/PROM baseline LEFT   eval  Shoulder extension 52 50  Shoulder flexion 160 160 - tight lat  Shoulder abduction 180 170 - tight lateral breast  Shoulder internal rotation 53   Shoulder external rotation 97 92    (Blank rows = not tested)   UPPER EXTREMITY STRENGTH: WNL  LYMPHEDEMA ASSESSMENTS:   L-DEX LYMPHEDEMA SCREENING: L-DEX LYMPHEDEMA SCREENING Measurement Type: Unilateral L-DEX MEASUREMENT EXTREMITY: Upper Extremity POSITION : Standing DOMINANT SIDE: Right At Risk Side: Left BASELINE SCORE (UNILATERAL): 0.4 L-DEX SCORE (UNILATERAL): 0 VALUE CHANGE (UNILAT): -0.4 Comment: 189, Rt permanent bracelet  QUICK DASH SURVEY:  36% from 2.27%  BREAST COMPLAINTS QUESTIONNAIRE    06/25/23 Pain:   0 Heaviness:  5 Swollen feeling: 5 Tense Skin:  5 Redness:  0 Bra Print:  0 Size of Pores:  0 Hard feeling:   10 Total:   25  /80 A Score over 9 indicates lymphedema issues in the breast                                                                                                                             TREATMENT DATE:  06/25/23 06/25/23 Eval performed Instruction on exercises per below with performance x 2 of each. Pt concerned about scar tissue ripping so gave repeated reminders that this was not possible and showed her picture of radiation fibrosis to demonstrate how if feels different after radiation.   Pt is already applying oil to the breast and doing massage and using compression bra.    PATIENT EDUCATION:  Education details: per today's note Person educated: Patient Education method: Explanation, Demonstration, Actor cues, Verbal cues, and Handouts Education comprehension: verbalized understanding, returned demonstration, verbal cues required, and needs further education  HOME EXERCISE PROGRAM: Access Code: [email or web address] URL: [email or web address] Date: 06/25/2023 Prepared by: Gwenevere Abbot  Exercises - Single Arm Doorway Pec Stretch at 90 Degrees Abduction  - 1 x daily - 7 x weekly - 3 reps - 20-30 second hold - Doorway Pec Stretch at 90 Degrees Abduction  - 1 x daily - 7 x weekly - 1 sets - 3 reps - 20-30 seconds hold - Standing Shoulder Flexion Wall Walk  - 1 x daily - 7 x weekly - 1 sets - 5 reps - 5sec hold - Supine Chest Stretch with Elbows Bent  - 1 x daily - 7 x weekly - 1 sets - 3 reps - 30-60seconds hold  ASSESSMENT:  CLINICAL IMPRESSION: Patient is a 43 y.o. female who was seen today for physical therapy evaluation and treatment for her continued Lt breast tightness and feelings of edema.  She does not seem to have breast lymphedema but does have some general incisional  tenderness and maybe soft general edema.  She is limited in her AROM bilaterally compared to baseline and is a bit fearful of tight sensations.  Her SOZO was almost unchanged at this time.  She will benefit from PT at this time to improve Lt shoulder mobility and ROM bilaterally, decrease feelings of tightness in the left breast and axilla and work into exercise to feel comfortable doing upper body work in the gym.    OBJECTIVE IMPAIRMENTS: decreased activity tolerance, decreased knowledge of condition, decreased knowledge of use of DME, decreased ROM, increased edema, and impaired UE functional use.   ACTIVITY LIMITATIONS: lifting  PARTICIPATION LIMITATIONS: job, gym  PERSONAL FACTORS: 1-2 comorbidities: SLNB, radiation  are also affecting patient's functional outcome.   REHAB POTENTIAL: Excellent  CLINICAL DECISION MAKING: Stable/uncomplicated  EVALUATION COMPLEXITY: Low  GOALS: Goals reviewed with patient? Yes  SHORT TERM GOALS=LTGs: Target date: 08/06/23  Pt will be ind with stretches for continued tightness post radiation Baseline: Goal status: INITIAL  2.  Pt will feel ready to work with trainer and include upper body exercises.  Baseline:  Goal status: INITIAL  3.  Pt will be ind with self MLD for the left breast  Baseline:  Goal status: INITIAL  4.  Pt will decrease breast edema questionnaire to 9/80 or less Baseline: 25 Goal status: INITIAL   PLAN:  PT FREQUENCY: 1-2x/week  PT DURATION: 4 weeks  PLANNED INTERVENTIONS: 97110-Therapeutic exercises, 97535- Self Care, [id number]- Manual therapy, Taping, Manual lymph drainage, DME instructions, Therapeutic exercises, Therapeutic activity, Neuromuscular re-education, Gait training, and Self Care  PLAN FOR NEXT SESSION: stretches bil shoulders, supine foam roll series maybe without roll first, STM Lt axilla and scar tissue - try small cupping eventually or taping  Leidy Massar, Julieanne Manson, PT 06/25/2023, 9:51 AM

## 2023-06-25 ENCOUNTER — Ambulatory Visit: Payer: Managed Care, Other (non HMO) | Attending: Hematology and Oncology | Admitting: Rehabilitation

## 2023-06-25 ENCOUNTER — Encounter: Payer: Self-pay | Admitting: Rehabilitation

## 2023-06-25 ENCOUNTER — Other Ambulatory Visit: Payer: Self-pay

## 2023-06-25 DIAGNOSIS — R293 Abnormal posture: Secondary | ICD-10-CM | POA: Diagnosis present

## 2023-06-25 DIAGNOSIS — C50412 Malignant neoplasm of upper-outer quadrant of left female breast: Secondary | ICD-10-CM | POA: Diagnosis present

## 2023-06-25 DIAGNOSIS — Z9189 Other specified personal risk factors, not elsewhere classified: Secondary | ICD-10-CM

## 2023-06-25 DIAGNOSIS — Z170 Estrogen receptor positive status [ER+]: Secondary | ICD-10-CM | POA: Diagnosis not present

## 2023-06-25 DIAGNOSIS — Z483 Aftercare following surgery for neoplasm: Secondary | ICD-10-CM

## 2023-06-25 DIAGNOSIS — R600 Localized edema: Secondary | ICD-10-CM | POA: Diagnosis present

## 2023-06-26 ENCOUNTER — Other Ambulatory Visit: Payer: Managed Care, Other (non HMO)

## 2023-06-26 ENCOUNTER — Ambulatory Visit: Payer: Managed Care, Other (non HMO) | Admitting: Hematology and Oncology

## 2023-06-26 ENCOUNTER — Other Ambulatory Visit: Payer: Self-pay

## 2023-06-29 ENCOUNTER — Telehealth: Payer: Self-pay

## 2023-06-29 NOTE — Telephone Encounter (Signed)
Notified patient that FMLA and APS documents had been completed and faxed back to company. Fax confirmation received. Copies of documents placed up front for pick up at next visit. No further concerns at this time.

## 2023-07-02 ENCOUNTER — Ambulatory Visit: Payer: Managed Care, Other (non HMO) | Admitting: Rehabilitation

## 2023-07-02 ENCOUNTER — Encounter: Payer: Self-pay | Admitting: Rehabilitation

## 2023-07-02 DIAGNOSIS — Z9189 Other specified personal risk factors, not elsewhere classified: Secondary | ICD-10-CM

## 2023-07-02 DIAGNOSIS — C50412 Malignant neoplasm of upper-outer quadrant of left female breast: Secondary | ICD-10-CM | POA: Diagnosis not present

## 2023-07-02 DIAGNOSIS — Z483 Aftercare following surgery for neoplasm: Secondary | ICD-10-CM

## 2023-07-02 DIAGNOSIS — R600 Localized edema: Secondary | ICD-10-CM

## 2023-07-02 DIAGNOSIS — R293 Abnormal posture: Secondary | ICD-10-CM

## 2023-07-02 NOTE — Therapy (Signed)
OUTPATIENT PHYSICAL THERAPY  UPPER EXTREMITY ONCOLOGY  Patient Name: Nicole Maddox MRN: [id number] DOB:[birth year], 43 y.o., female Today's Date: 07/02/2023  END OF SESSION:  PT End of Session - 07/02/23 0803     Visit Number 2    Number of Visits 9    Date for PT Re-Evaluation 08/06/23    PT Start Time 0803    PT Stop Time 0856    PT Time Calculation (min) 53 min    Activity Tolerance Patient tolerated treatment well    Behavior During Therapy [facility] for tasks assessed/performed             Past Medical History:  Diagnosis Date   Breast cancer (HCC) 06/23/2022   Family history of adverse reaction to anesthesia    mother with h/o slow to wake   Family history of stomach cancer    GERD (gastroesophageal reflux disease)    Hyperthyroidism    due to Graves' disease   Multinodular thyroid    Thyroid disease    Past Surgical History:  Procedure Laterality Date   BREAST BIOPSY Left 06/23/2022   [place] LT BREAST BX W LOC DEV 1ST LESION IMG BX SPEC US GUIDE 06/23/2022 [facility]   BREAST BIOPSY Left 01/02/2023   [place] LT RADIOACTIVE SEED LOC 01/02/2023 [facility]   BREAST BIOPSY  01/02/2023   MM RT RADIOACTIVE SEED LOC MAMMO GUIDE 01/02/2023 [facility]   BREAST LUMPECTOMY WITH RADIOACTIVE SEED AND SENTINEL LYMPH NODE BIOPSY Left 01/04/2023   Procedure: LEFT BREAST LUMPECTOMY WITH RADIOACTIVE SEED AND SENTINEL LYMPH NODE BIOPSY;  Surgeon: Griselda Miner, MD;  Location: [facility];  Service: General;  Laterality: Left;  PEC BLOCK   BREAST LUMPECTOMY WITH RADIOACTIVE SEED LOCALIZATION Right 01/04/2023   Procedure: RIGHT BREAST LUMPECTOMY WITH RADIOACTIVE SEED LOCALIZATION;  Surgeon: Griselda Miner, MD;  Location: [facility];  Service: General;  Laterality: Right;   BREAST REDUCTION WITH MASTOPEXY Bilateral 01/04/2023   Procedure: BREAST REDUCTION WITH MASTOPEXY;  Surgeon: Peggye Form, DO;  Location: [facility] OR;  Service: Plastics;  Laterality: Bilateral;   PORT-A-CATH REMOVAL N/A  03/01/2023   Procedure: REMOVAL PORT-A-CATH;  Surgeon: Griselda Miner, MD;  Location: [facility];  Service: General;  Laterality: N/A;   PORTACATH PLACEMENT Right 07/19/2022   Procedure: INSERTION PORT-A-CATH;  Surgeon: Griselda Miner, MD;  Location: [facility];  Service: General;  Laterality: Right;  60 MIN ROOM 8   WISDOM TOOTH EXTRACTION     Patient Active Problem List   Diagnosis Date Noted   Port-A-Cath in place 07/20/2022   Genetic testing 07/07/2022   Family history of stomach cancer 06/29/2022   Malignant neoplasm of upper-outer quadrant of left female breast (HCC) 06/27/2022   Seborrheic dermatitis of scalp 02/25/2018    PCP: Dr. Aviva Signs  REFERRING PROVIDER: Dr. Rachel Moulds  REFERRING DIAG:  Diagnosis  C50.412,Z17.0 (ICD-10-CM) - Malignant neoplasm of upper-outer quadrant of left breast in female, estrogen receptor positive (HCC)    THERAPY DIAG:  Malignant neoplasm of upper-outer quadrant of left female breast, unspecified estrogen receptor status (HCC)  Abnormal posture  Aftercare following surgery for neoplasm  At risk for lymphedema  Localized edema  ONSET DATE: 06/23/2022  Rationale for Evaluation and Treatment: Rehabilitation  SUBJECTIVE:  SUBJECTIVE STATEMENT: Today is a good today.    EVAL: I am having swelling in the breast and it hurts to raise my arm.  It has been going on since after radiation.  I have the compression surgery bra.    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  Completed chemotherapy. Lt lumpectomy with 6 negative nodes. Completed radiation 03/29/23. On Capecitabine (Xeloda) chemo.   PAIN:  Are you having pain? No  PRECAUTIONS: Lt lymphedema risk  RED FLAGS: None   WEIGHT BEARING RESTRICTIONS:  No  FALLS:  Has patient fallen in last 6 months? No  LIVING ENVIRONMENT: Lives with: lives alone Lives in: House/apartment  OCCUPATION: on leave from mental health therapist  LEISURE: reading, shopping, light exercise with the trainer 2x per week   HAND DOMINANCE: right   PRIOR LEVEL OF FUNCTION: Independent  PATIENT GOALS: learn what to do for the upper body.     OBJECTIVE: Note: Objective measures were completed at Evaluation unless otherwise noted.  COGNITION: Overall cognitive status: Within functional limits for tasks assessed   PALPATION: Increased fibrosis along reduction incisions inferior and line to nipple as well as axillary incision. Rt incisions are slightly firm but not painful  OBSERVATIONS / OTHER ASSESSMENTS: Lt breast appears around the same size as the Rt but after radiation should be a bit smaller.  No enlarged pores  POSTURE: WNL  UPPER EXTREMITY AROM/PROM:  A/PROM baseline RIGHT   eval   Shoulder extension 51 55  Shoulder flexion 150 155  Shoulder abduction 180 165  Shoulder internal rotation 60 60  Shoulder external rotation 100 80    (Blank rows = not tested)  A/PROM baseline LEFT   eval  Shoulder extension 52 50  Shoulder flexion 160 160 - tight lat  Shoulder abduction 180 170 - tight lateral breast  Shoulder internal rotation 53   Shoulder external rotation 97 92    (Blank rows = not tested)   UPPER EXTREMITY STRENGTH: WNL  LYMPHEDEMA ASSESSMENTS:   L-DEX LYMPHEDEMA SCREENING: L-DEX LYMPHEDEMA SCREENING Measurement Type: Unilateral L-DEX MEASUREMENT EXTREMITY: Upper Extremity POSITION : Standing DOMINANT SIDE: Right At Risk Side: Left BASELINE SCORE (UNILATERAL): 0.4 L-DEX SCORE (UNILATERAL): 0 VALUE CHANGE (UNILAT): -0.4 Comment: 189, Rt permanent bracelet  QUICK DASH SURVEY: 36% from 2.27%  BREAST COMPLAINTS QUESTIONNAIRE    06/25/23 Pain:   0 Heaviness:  5 Swollen feeling: 5 Tense Skin:  5 Redness:  0 Bra  Print:  0 Size of Pores:  0 Hard feeling:   10 Total:   25  /80 A Score over 9 indicates lymphedema issues in the breast                                                                                                                             TREATMENT DATE: 06/25/23 07/02/23 Therapeutic Exercise Pulleys with initial instruction x into flexion and abduction with initial cueing and instruction Wall ball flexion x 5  Supine alternating flexion, snow angel x 6, horizontal abduction alternating x 6, pro/ret x 6, chest stretch x 60"  Manual Therapy  STM Left upper quadrant - pectoralis, axilla, lat with cocoa butter PROM with blocking at lat  06/25/23 Eval performed Instruction on exercises per below with performance x 2 of each. Pt concerned about scar tissue ripping so gave repeated reminders that this was not possible and showed her picture of radiation fibrosis to demonstrate how if feels different after radiation.   Pt is already applying oil to the breast and doing massage and using compression bra.    PATIENT EDUCATION:  Education details: per today's note Person educated: Patient Education method: Explanation, Demonstration, Actor cues, Verbal cues, and Handouts Education comprehension: verbalized understanding, returned demonstration, verbal cues required, and needs further education  HOME EXERCISE PROGRAM: Access Code: [email or web address] URL: [email or web address] Date: 06/25/2023 Prepared by: Gwenevere Abbot  Exercises - Single Arm Doorway Pec Stretch at 90 Degrees Abduction  - 1 x daily - 7 x weekly - 3 reps - 20-30 second hold - Doorway Pec Stretch at 90 Degrees Abduction  - 1 x daily - 7 x weekly - 1 sets - 3 reps - 20-30 seconds hold - Standing Shoulder Flexion Wall Walk  - 1 x daily - 7 x weekly - 1 sets - 5 reps - 5sec hold - Supine Chest Stretch with Elbows Bent  - 1 x daily - 7 x weekly - 1 sets - 3 reps - 30-60seconds hold  ASSESSMENT:  CLINICAL  IMPRESSION: Pt tolerated first session of PT well.  She noted more discomfort on the Rt shoulder vs the Left chest with exercise for unknown reason.   OBJECTIVE IMPAIRMENTS: decreased activity tolerance, decreased knowledge of condition, decreased knowledge of use of DME, decreased ROM, increased edema, and impaired UE functional use.   ACTIVITY LIMITATIONS: lifting  PARTICIPATION LIMITATIONS: job, gym  PERSONAL FACTORS: 1-2 comorbidities: SLNB, radiation  are also affecting patient's functional outcome.   REHAB POTENTIAL: Excellent  CLINICAL DECISION MAKING: Stable/uncomplicated  EVALUATION COMPLEXITY: Low  GOALS: Goals reviewed with patient? Yes  SHORT TERM GOALS=LTGs: Target date: 08/06/23  Pt will be ind with stretches for continued tightness post radiation Baseline: Goal status: INITIAL  2.  Pt will feel ready to work with trainer and include upper body exercises.  Baseline:  Goal status: INITIAL  3.  Pt will be ind with self MLD for the left breast  Baseline:  Goal status: INITIAL  4.  Pt will decrease breast edema questionnaire to 9/80 or less Baseline: 25 Goal status: INITIAL   PLAN:  PT FREQUENCY: 1-2x/week  PT DURATION: 4 weeks  PLANNED INTERVENTIONS: 97110-Therapeutic exercises, 97535- Self Care, 29528- Manual therapy, Taping, Manual lymph drainage, DME instructions, Therapeutic exercises, Therapeutic activity, Neuromuscular re-education, Gait training, and Self Care  PLAN FOR NEXT SESSION: stretches bil shoulders, supine foam roll series maybe without roll first, STM Lt axilla and scar tissue - try small cupping eventually or taping  Ashari Llewellyn, Julieanne Manson, PT 07/02/2023, 8:59 AM

## 2023-07-11 ENCOUNTER — Inpatient Hospital Stay: Payer: Managed Care, Other (non HMO) | Attending: Hematology and Oncology

## 2023-07-11 ENCOUNTER — Ambulatory Visit: Payer: Managed Care, Other (non HMO) | Attending: Hematology and Oncology | Admitting: Rehabilitation

## 2023-07-11 ENCOUNTER — Inpatient Hospital Stay (HOSPITAL_BASED_OUTPATIENT_CLINIC_OR_DEPARTMENT_OTHER): Payer: Managed Care, Other (non HMO) | Admitting: Hematology and Oncology

## 2023-07-11 VITALS — BP 120/72 | HR 88 | Temp 97.5°F | Resp 16 | Wt 189.3 lb

## 2023-07-11 DIAGNOSIS — R600 Localized edema: Secondary | ICD-10-CM | POA: Diagnosis present

## 2023-07-11 DIAGNOSIS — Z170 Estrogen receptor positive status [ER+]: Secondary | ICD-10-CM

## 2023-07-11 DIAGNOSIS — R293 Abnormal posture: Secondary | ICD-10-CM | POA: Diagnosis present

## 2023-07-11 DIAGNOSIS — Z9189 Other specified personal risk factors, not elsewhere classified: Secondary | ICD-10-CM | POA: Diagnosis present

## 2023-07-11 DIAGNOSIS — Z79899 Other long term (current) drug therapy: Secondary | ICD-10-CM | POA: Insufficient documentation

## 2023-07-11 DIAGNOSIS — Z1732 Human epidermal growth factor receptor 2 negative status: Secondary | ICD-10-CM | POA: Insufficient documentation

## 2023-07-11 DIAGNOSIS — D649 Anemia, unspecified: Secondary | ICD-10-CM | POA: Insufficient documentation

## 2023-07-11 DIAGNOSIS — Z1722 Progesterone receptor negative status: Secondary | ICD-10-CM | POA: Insufficient documentation

## 2023-07-11 DIAGNOSIS — Z79620 Long term (current) use of immunosuppressive biologic: Secondary | ICD-10-CM | POA: Insufficient documentation

## 2023-07-11 DIAGNOSIS — C50412 Malignant neoplasm of upper-outer quadrant of left female breast: Secondary | ICD-10-CM | POA: Insufficient documentation

## 2023-07-11 DIAGNOSIS — Z483 Aftercare following surgery for neoplasm: Secondary | ICD-10-CM | POA: Diagnosis present

## 2023-07-11 DIAGNOSIS — L271 Localized skin eruption due to drugs and medicaments taken internally: Secondary | ICD-10-CM | POA: Insufficient documentation

## 2023-07-11 DIAGNOSIS — Z171 Estrogen receptor negative status [ER-]: Secondary | ICD-10-CM | POA: Insufficient documentation

## 2023-07-11 LAB — CBC WITH DIFFERENTIAL/PLATELET
Abs Immature Granulocytes: 0.06 10*3/uL (ref 0.00–0.07)
Basophils Absolute: 0.1 10*3/uL (ref 0.0–0.1)
Basophils Relative: 1 %
Eosinophils Absolute: 0.3 10*3/uL (ref 0.0–0.5)
Eosinophils Relative: 5 %
HCT: 29.4 % — ABNORMAL LOW (ref 36.0–46.0)
Hemoglobin: 9.8 g/dL — ABNORMAL LOW (ref 12.0–15.0)
Immature Granulocytes: 1 %
Lymphocytes Relative: 31 %
Lymphs Abs: 2 10*3/uL (ref 0.7–4.0)
MCH: 26.6 pg (ref 26.0–34.0)
MCHC: 33.3 g/dL (ref 30.0–36.0)
MCV: 79.9 fL — ABNORMAL LOW (ref 80.0–100.0)
Monocytes Absolute: 0.5 10*3/uL (ref 0.1–1.0)
Monocytes Relative: 7 %
Neutro Abs: 3.6 10*3/uL (ref 1.7–7.7)
Neutrophils Relative %: 55 %
Platelets: 264 10*3/uL (ref 150–400)
RBC: 3.68 MIL/uL — ABNORMAL LOW (ref 3.87–5.11)
RDW: 28.4 % — ABNORMAL HIGH (ref 11.5–15.5)
WBC: 6.5 10*3/uL (ref 4.0–10.5)
nRBC: 0 % (ref 0.0–0.2)

## 2023-07-11 LAB — CMP (CANCER CENTER ONLY)
ALT: 29 U/L (ref 0–44)
AST: 26 U/L (ref 15–41)
Albumin: 4.4 g/dL (ref 3.5–5.0)
Alkaline Phosphatase: 58 U/L (ref 38–126)
Anion gap: 5 (ref 5–15)
BUN: 6 mg/dL (ref 6–20)
CO2: 28 mmol/L (ref 22–32)
Calcium: 8.8 mg/dL — ABNORMAL LOW (ref 8.9–10.3)
Chloride: 106 mmol/L (ref 98–111)
Creatinine: 0.73 mg/dL (ref 0.44–1.00)
GFR, Estimated: >60 mL/min (ref >60)
Glucose, Bld: 86 mg/dL (ref 70–99)
Potassium: 3.1 mmol/L — ABNORMAL LOW (ref 3.5–5.1)
Sodium: 139 mmol/L (ref 135–145)
Total Bilirubin: 0.4 mg/dL (ref 0.0–1.2)
Total Protein: 6.8 g/dL (ref 6.5–8.1)

## 2023-07-11 LAB — TSH: TSH: 0.409 u[IU]/mL (ref 0.350–4.500)

## 2023-07-11 NOTE — Progress Notes (Signed)
 BRIEF ONCOLOGIC HISTORY:  Oncology History  Malignant neoplasm of upper-outer quadrant of left female breast (HCC)  06/23/2022 Initial Diagnosis   Left breast needle core biopsy at 1:00, 10 cmfn: IDC< grade 3, 1.1 cm, ER 30% positive, weak staining intensity, PR negative, Ki-67 95%, HER2 negative.  Axillary node negative.    06/28/2022 Cancer Staging   Staging form: Breast, AJCC 8th Edition - Clinical stage from 06/28/2022: Stage IB (cT1c, cN0(f), cM0, G3, ER+, PR-, HER2-) - Signed by Lanell Donald Stagger, PA-C on 06/28/2022 Stage prefix: Initial diagnosis Method of lymph node assessment: Core biopsy Histologic grading system: 3 grade system   07/11/2022 Genetic Testing   Negative genetic testing on the 9 gene STAT panel.  The report date is July 05, 2022. Negative genetic testing on the Multi-cancer panel.  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  BLM c.3136G>A (p.Gly1046Ser), EGFR c.61G>A (p.Ala21Thr) and PDGFRA c.50G>T (p.Gly17Val) VUS identified.  The report date is July 11, 2022.  The STAT Breast cancer panel offered by [facility] includes sequencing and rearrangement analysis for the following 9 genes:  ATM, BRCA1, BRCA2, CDH1, CHEK2, PALB2, PTEN, STK11 and TP53.   The Multi-Cancer + RNA Panel offered by [facility] includes sequencing and/or deletion/duplication analysis of the following 70 genes:  AIP*, ALK, APC*, ATM*, AXIN2*, BAP1*, BARD1*, BLM*, BMPR1A*, BRCA1*, BRCA2*, BRIP1*, CDC73*, CDH1*, CDK4, CDKN1B*, CDKN2A, CHEK2*, CTNNA1*, DICER1*, EPCAM (del/dup only), EGFR, FH*, FLCN*, GREM1 (promoter dup only), HOXB13, KIT, LZTR1, MAX*, MBD4, MEN1*, MET, MITF, MLH1*, MSH2*, MSH3*, MSH6*, MUTYH*, NF1*, NF2*, NTHL1*, PALB2*, PDGFRA, PMS2*, POLD1*, POLE*, POT1*, PRKAR1A*, PTCH1*, PTEN*, RAD51C*, RAD51D*, RB1*, RET, SDHA* (sequencing only), SDHAF2*, SDHB*, SDHC*, SDHD*, SMAD4*, SMARCA4*, SMARCB1*, SMARCE1*, STK11*, SUFU*, TMEM127*, TP53*, TSC1*, TSC2*, VHL*. RNA analysis is performed for * genes.    07/20/2022 -  11/30/2022 Chemotherapy   Patient is on Treatment Plan : BREAST ADJUVANT DOSE DENSE AC q14d / PACLitaxel  q7d     07/21/2022 Procedure   Right breast needle core biopsy upper outer breast x 2: Negative for malignancy, left breast needle core biopsy lower: Negative for malignancy.   08/07/2022 Procedure   2 additional right breast needle core biopsies both negative for malignancy   01/04/2023 Surgery   Left breast lumpectomy: Invasive ductal carcinoma, 1.6 cm, grade 3, margins negative, 6 lymph nodes negative for malignancy.  Repeat prognostic panel: Triple negative breast cancer with a Ki67 of 90%.  Right breast lumpectomy: Negative for carcinoma.  Patient also underwent bilateral mammoplasties and path was negative for carcinoma on both specimen submitted.    - 03/29/2023 Radiation Therapy   Left breast: 40.05 Gy in 15 treatments "Boost": 10 cGy in 5 treatments    04/17/2023 -  Chemotherapy   Patient is on Treatment Plan : BREAST Capecitabine  q21d       INTERVAL HISTORY:  [name] is here for follow up.  Discussed the use of AI scribe software for clinical note transcription with the patient, who gave verbal consent to proceed.  History of Present Illness    Nicole Maddox is a 43 year old female with breast cancer who presents with ongoing symptoms related to chemotherapy treatment.  She is currently undergoing chemotherapy for breast cancer, specifically on her third cycle of Xeloda . This week is her scheduled week off from Xeloda .  She experiences diarrhea, characterized by liquid stools occurring once a day. Initially, she did not recognize it as diarrhea. She notes having more good days than bad days.  She describes darkening of her hands and feet,  with occasional itchiness, a known side effect of Xeloda . Her hands are darker than her feet.  She continues to experience stomach pains and cramps, though they have become less frequent since her medication dose was reduced. She also  reports skeletal pain when lying down and discomfort at her incision sites, particularly after attempting to exercise.  She expresses fatigue and notes a decreased appetite, with food not tasting the same. She is taking  four  pills of xeloda  in the morning and four at night, and finds it challenging to remember to take them due to her lack of appetite.  She mentions that her legs feel funny when sitting up, describing soreness in her shins. She tries to maintain hydration and occasionally eats bananas and dark chocolate.  She is currently out of work and has decided to stay out until her treatment is completed. She has no children or pets and lives alone.  Rest of the pertinent 10 point ROS reviewed and neg.  PAST MEDICAL/SURGICAL HISTORY:  Past Medical History:  Diagnosis Date   Breast cancer (HCC) 06/23/2022   Family history of adverse reaction to anesthesia    mother with h/o slow to wake   Family history of stomach cancer    GERD (gastroesophageal reflux disease)    Hyperthyroidism    due to Graves' disease   Multinodular thyroid     Thyroid  disease    Past Surgical History:  Procedure Laterality Date   BREAST BIOPSY Left 06/23/2022   US  LT BREAST BX W LOC DEV 1ST LESION IMG BX SPEC US  GUIDE 06/23/2022 [facility]   BREAST BIOPSY Left 01/02/2023   US  LT RADIOACTIVE SEED LOC 01/02/2023 [facility]   BREAST BIOPSY  01/02/2023   MM RT RADIOACTIVE SEED LOC MAMMO GUIDE 01/02/2023 [facility]   BREAST LUMPECTOMY WITH RADIOACTIVE SEED AND SENTINEL LYMPH NODE BIOPSY Left 01/04/2023   Procedure: LEFT BREAST LUMPECTOMY WITH RADIOACTIVE SEED AND SENTINEL LYMPH NODE BIOPSY;  Surgeon: Curvin Deward MOULD, MD;  Location: [facility];  Service: General;  Laterality: Left;  PEC BLOCK   BREAST LUMPECTOMY WITH RADIOACTIVE SEED LOCALIZATION Right 01/04/2023   Procedure: RIGHT BREAST LUMPECTOMY WITH RADIOACTIVE SEED LOCALIZATION;  Surgeon: Curvin Deward MOULD, MD;  Location: [facility] OR;  Service: General;   Laterality: Right;   BREAST REDUCTION WITH MASTOPEXY Bilateral 01/04/2023   Procedure: BREAST REDUCTION WITH MASTOPEXY;  Surgeon: Lowery Estefana RAMAN, DO;  Location: [facility];  Service: Plastics;  Laterality: Bilateral;   PORT-A-CATH REMOVAL N/A 03/01/2023   Procedure: REMOVAL PORT-A-CATH;  Surgeon: Curvin Deward MOULD, MD;  Location: [facility];  Service: General;  Laterality: N/A;   PORTACATH PLACEMENT Right 07/19/2022   Procedure: INSERTION PORT-A-CATH;  Surgeon: Curvin Deward MOULD, MD;  Location: [facility];  Service: General;  Laterality: Right;  60 MIN ROOM 8   WISDOM TOOTH EXTRACTION       ALLERGIES:  Allergies  Allergen Reactions   Adriamycin  [Doxorubicin ] Other (See Comments)    Patient c/o of lower back pain and right arm pain. See progress note from 07/20/22     CURRENT MEDICATIONS:  Outpatient Encounter Medications as of 07/11/2023  Medication Sig   capecitabine  (XELODA ) 500 MG tablet Take 4 tablets (2,000 mg total) by mouth 2 (two) times daily after a meal. Take on days 1-14. Repeat every 21 days.   COD LIVER OIL PO Take by mouth daily.   Cyanocobalamin (VITAMIN B-12 PO) Take by mouth daily.   DENTA 5000 PLUS 1.1 %  CREA dental cream Place 1 Application onto teeth daily.   diclofenac  Sodium (VOLTAREN ) 1 % G[employer] Patient: Apply 0.5 grams (1 fingertip) to each hand and each foot twice daily for up to 12 weeks   folic acid  (FOLVITE ) 1 MG tablet Take 1 mg by mouth daily.   loratadine (CLARITIN) 10 MG tablet Take 10 mg by mouth daily.   metFORMIN (GLUCOPHAGE) 500 MG tablet    methimazole (TAPAZOLE) 5 MG tablet Take 15 mg by mouth 2 (two) times daily.   omeprazole (PRILOSEC) 40 MG capsule Take 40 mg by mouth daily.   No facility-administered encounter medications on file as of 07/11/2023.     ONCOLOGIC FAMILY HISTORY:  Family History  Problem Relation Age of Onset   Heart attack Father 6   Stomach cancer Maternal Aunt        dx > 50   Throat cancer  Maternal Uncle        dx 28s   Stomach cancer Maternal Uncle        dx > 50   Heart Problems Maternal Grandmother    Breast cancer Cousin        mother's maternal first cousin     SOCIAL HISTORY:  Social History   Socioeconomic History   Marital status: Single    Spouse name: Not on file   Number of children: Not on file   Years of education: Not on file   Highest education level: Not on file  Occupational History   Not on file  Tobacco Use   Smoking status: Never   Smokeless tobacco: Never  Vaping Use   Vaping status: Never Used  Substance and Sexual Activity   Alcohol  use: Yes    Comment: socially   Drug use: Never   Sexual activity: Not Currently    Partners: Male    Birth control/protection: Condom    Comment: intercourse age 84, more than 6 sexual parters,   Other Topics Concern   Not on file  Social History Narrative   Not on file   Social Drivers of Health   Financial Resource Strain: Low Risk  (07/04/2022)   Overall Financial Resource Strain (CARDIA)    Difficulty of Paying Living Expenses: Not very hard  Food Insecurity: No Food Insecurity (06/28/2022)   Hunger Vital Sign    Worried About Running Out of Food in the Last Year: Never true    Ran Out of Food in the Last Year: Never true  Transportation Needs: No Transportation Needs (06/28/2022)   PRAPARE - Administrator, Civil Service (Medical): No    Lack of Transportation (Non-Medical): No  Physical Activity: Not on file  Stress: Not on file  Social Connections: Unknown (01/09/2023)   Received [facility]   Social Connections    Frequency of Communication with Friends and Family: Not asked    Frequency of Social Gatherings with Friends and Family: Not asked  Intimate Partner Violence: Unknown (01/09/2023)   Received from [facility]   Intimate Partner Violence    Fear of Current or Ex-Partner: Not asked    Emotionally Abused: Not asked    Physically Abused: Not asked    Sexually  Abused: Not asked    OBSERVATIONS/OBJECTIVE:  Physical Exam Constitutional:      Appearance: Normal appearance.  Cardiovascular:     Rate and Rhythm: Normal rate and regular rhythm.     Pulses: Normal pulses.     Heart sounds: Normal heart sounds.  Pulmonary:     Effort: Pulmonary effort is normal.     Breath sounds: Normal breath sounds.  Musculoskeletal:        General: No swelling. Normal range of motion.     Cervical back: Normal range of motion.  Lymphadenopathy:     Cervical: No cervical adenopathy.  Skin:    General: Skin is warm and dry.  Neurological:     General: No focal deficit present.     Mental Status: She is alert.      LABORATORY DATA:  None for this visit.  DIAGNOSTIC IMAGING:  None for this visit.      ASSESSMENT AND PLAN:  Ms.. Forquer is a pleasant 43 y.o. female with Stage IB left breast invasive ductal carcinoma, ER-/PR-/HER2-, diagnosed in 06/2022, treated with neoadjuvant chemotherapy, lumpectomy, adjuvant radiation therapy, and adjuvant capecitabine .   Breast Cancer Patient is currently on Xeloda . Patient reports mild diarrhea, darkening of hands and feet, stomach pains, and skeletal discomfort. Noted pain in the breast with exertion and a hard feeling in the surgical area. No concern for recurrence, post treatment changes. -Continue current treatment regimen. -Consider reducing Xeloda  dose to 3 pills in the morning and 4 in the evening to potentially alleviate side effects. -Continue physical therapy for post-surgical recovery. - Due for mammogram, scheduled 2/13.  Hand-Foot Syndrome Darkening of hands and feet, dryness, and dark spots noted. Likely side effect of Xeloda . -Continue current treatment regimen. -Consider reducing Xeloda  dose to potentially alleviate side effects.  Normocytic normochromic anemia, mildly worse compared to last visit.  No indication for transfusion.  Will continue to monitor  General Health  Maintenance -Encourage adequate hydration -Follow-up in three weeks.  Follow-up in 3 weeks.  *Total Encounter Time as defined by the Centers for Medicare and Medicaid Services includes, in addition to the face-to-face time of a patient visit (documented in the note above) non-face-to-face time: obtaining and reviewing outside history, ordering and reviewing medications, tests or procedures, care coordination (communications with other health care professionals or caregivers) and documentation in the medical record.

## 2023-07-11 NOTE — Therapy (Signed)
 OUTPATIENT PHYSICAL THERAPY  UPPER EXTREMITY ONCOLOGY  Patient Name: Nicole Maddox MRN: [id number] DOB:12/06/1980, 43 y.o., female Today's Date: 07/11/2023  END OF SESSION:  PT End of Session - 07/11/23 1555     Visit Number 3    Number of Visits 9    Date for PT Re-Evaluation 08/06/23    PT Start Time 1502    PT Stop Time 1550    PT Time Calculation (min) 48 min    Activity Tolerance Patient tolerated treatment well    Behavior During Therapy [facility] for tasks assessed/performed              Past Medical History:  Diagnosis Date   Breast cancer (HCC) 06/23/2022   Family history of adverse reaction to anesthesia    mother with h/o slow to wake   Family history of stomach cancer    GERD (gastroesophageal reflux disease)    Hyperthyroidism    due to Graves' disease   Multinodular thyroid     Thyroid  disease    Past Surgical History:  Procedure Laterality Date   BREAST BIOPSY Left 06/23/2022   US  LT BREAST BX W LOC DEV 1ST LESION IMG BX SPEC US  GUIDE 06/23/2022 [facility]   BREAST BIOPSY Left 01/02/2023   US  LT RADIOACTIVE SEED LOC 01/02/2023 [facility]   BREAST BIOPSY  01/02/2023   MM RT RADIOACTIVE SEED LOC MAMMO GUIDE 01/02/2023 [facility]   BREAST LUMPECTOMY WITH RADIOACTIVE SEED AND SENTINEL LYMPH NODE BIOPSY Left 01/04/2023   Procedure: LEFT BREAST LUMPECTOMY WITH RADIOACTIVE SEED AND SENTINEL LYMPH NODE BIOPSY;  Surgeon: Curvin Deward MOULD, MD;  Location: [facility] OR;  Service: General;  Laterality: Left;  PEC BLOCK   BREAST LUMPECTOMY WITH RADIOACTIVE SEED LOCALIZATION Right 01/04/2023   Procedure: RIGHT BREAST LUMPECTOMY WITH RADIOACTIVE SEED LOCALIZATION;  Surgeon: Curvin Deward MOULD, MD;  Location: [facility] OR;  Service: General;  Laterality: Right;   BREAST REDUCTION WITH MASTOPEXY Bilateral 01/04/2023   Procedure: BREAST REDUCTION WITH MASTOPEXY;  Surgeon: Lowery Estefana GORMAN, DO;  Location: [facility] OR;  Service: Plastics;  Laterality: Bilateral;   PORT-A-CATH REMOVAL  N/A 03/01/2023   Procedure: REMOVAL PORT-A-CATH;  Surgeon: Curvin Deward MOULD, MD;  Location: [facility];  Service: General;  Laterality: N/A;   PORTACATH PLACEMENT Right 07/19/2022   Procedure: INSERTION PORT-A-CATH;  Surgeon: Curvin Deward MOULD, MD;  Location: [facility];  Service: General;  Laterality: Right;  60 MIN ROOM 8   WISDOM TOOTH EXTRACTION     Patient Active Problem List   Diagnosis Date Noted   Port-A-Cath in place 07/20/2022   Genetic testing 07/07/2022   Family history of stomach cancer 06/29/2022   Malignant neoplasm of upper-outer quadrant of left female breast (HCC) 06/27/2022   Seborrheic dermatitis of scalp 02/25/2018    PCP: Dr. Reece Gave  REFERRING PROVIDER: Dr. Amber Stalls  REFERRING DIAG:  Diagnosis  C50.412,Z17.0 (ICD-10-CM) - Malignant neoplasm of upper-outer quadrant of left breast in female, estrogen receptor positive (HCC)    THERAPY DIAG:  Malignant neoplasm of upper-outer quadrant of left female breast, unspecified estrogen receptor status (HCC)  Abnormal posture  Aftercare following surgery for neoplasm  Localized edema  At risk for lymphedema  ONSET DATE: 06/23/2022  Rationale for Evaluation and Treatment: Rehabilitation  SUBJECTIVE:  SUBJECTIVE STATEMENT: I tried the gym and it hurt - bicep curls and overhead press with 5#   EVAL: I am having swelling in the breast and it hurts to raise my arm.  It has been going on since after radiation.  I have the compression surgery bra.    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  Completed chemotherapy. Lt lumpectomy with 6 negative nodes. Completed radiation 03/29/23. On Capecitabine  (Xeloda ) chemo.   PAIN:  Are you having pain? No  PRECAUTIONS: Lt lymphedema risk  RED  FLAGS: None   WEIGHT BEARING RESTRICTIONS: No  FALLS:  Has patient fallen in last 6 months? No  LIVING ENVIRONMENT: Lives with: lives alone Lives in: House/apartment  OCCUPATION: on leave from mental health therapist  LEISURE: reading, shopping, light exercise with the trainer 2x per week   HAND DOMINANCE: right   PRIOR LEVEL OF FUNCTION: Independent  PATIENT GOALS: learn what to do for the upper body.     OBJECTIVE: Note: Objective measures were completed at Evaluation unless otherwise noted.  COGNITION: Overall cognitive status: Within functional limits for tasks assessed   PALPATION: Increased fibrosis along reduction incisions inferior and line to nipple as well as axillary incision. Rt incisions are slightly firm but not painful  OBSERVATIONS / OTHER ASSESSMENTS: Lt breast appears around the same size as the Rt but after radiation should be a bit smaller.  No enlarged pores  POSTURE: WNL  UPPER EXTREMITY AROM/PROM:  A/PROM baseline RIGHT   eval   Shoulder extension 51 55  Shoulder flexion 150 155  Shoulder abduction 180 165  Shoulder internal rotation 60 60  Shoulder external rotation 100 80    (Blank rows = not tested)  A/PROM baseline LEFT   eval  Shoulder extension 52 50  Shoulder flexion 160 160 - tight lat  Shoulder abduction 180 170 - tight lateral breast  Shoulder internal rotation 53   Shoulder external rotation 97 92    (Blank rows = not tested)   UPPER EXTREMITY STRENGTH: WNL  LYMPHEDEMA ASSESSMENTS:   L-DEX LYMPHEDEMA SCREENING: L-DEX LYMPHEDEMA SCREENING Measurement Type: Unilateral L-DEX MEASUREMENT EXTREMITY: Upper Extremity POSITION : Standing DOMINANT SIDE: Right At Risk Side: Left BASELINE SCORE (UNILATERAL): 0.4 L-DEX SCORE (UNILATERAL): 0 VALUE CHANGE (UNILAT): -0.4 Comment: 189, Rt permanent bracelet  QUICK DASH SURVEY: 36% from 2.27%  BREAST COMPLAINTS QUESTIONNAIRE    06/25/23 Pain:   0 Heaviness:  5 Swollen  feeling: 5 Tense Skin:  5 Redness:  0 Bra Print:  0 Size of Pores:  0 Hard feeling:   10 Total:   25  /80 A Score over 9 indicates lymphedema issues in the breast                                                                                                                             TREATMENT DATE:  07/11/23 Therapeutic Exercise Pulleys with initial instruction x into flexion and abduction Wall  ball flexion x 5  Supine alternating flexion x 6, snow angel x 6, pro/ret x 6, chest stretch x 60  Manual Therapy  STM Left upper quadrant - pectoralis, axilla, lat with cocoa butter PROM with blocking at lat  07/02/23 Therapeutic Exercise Pulleys with initial instruction x into flexion and abduction with initial cueing and instruction Wall ball flexion x 5  Supine alternating flexion, snow angel x 6, horizontal abduction alternating x 6, pro/ret x 6, chest stretch x 60  Manual Therapy  STM Left upper quadrant - pectoralis, axilla, lat with cocoa butter PROM with blocking at lat  06/25/23 Eval performed Instruction on exercises per below with performance x 2 of each. Pt concerned about scar tissue ripping so gave repeated reminders that this was not possible and showed her picture of radiation fibrosis to demonstrate how if feels different after radiation.   Pt is already applying oil to the breast and doing massage and using compression bra.    PATIENT EDUCATION:  Education details: per today's note Person educated: Patient Education method: Explanation, Demonstration, Actor cues, Verbal cues, and Handouts Education comprehension: verbalized understanding, returned demonstration, verbal cues required, and needs further education  HOME EXERCISE PROGRAM: Access Code: [email or web address] URL: [email or web address] Date: 06/25/2023 Prepared by: Saddie Raw  Exercises - Single Arm Doorway Pec Stretch at 90 Degrees Abduction  - 1 x daily - 7 x weekly - 3 reps - 20-30  second hold - Doorway Pec Stretch at 90 Degrees Abduction  - 1 x daily - 7 x weekly - 1 sets - 3 reps - 20-30 seconds hold - Standing Shoulder Flexion Wall Walk  - 1 x daily - 7 x weekly - 1 sets - 5 reps - 5sec hold - Supine Chest Stretch with Elbows Bent  - 1 x daily - 7 x weekly - 1 sets - 3 reps - 30-60seconds hold  ASSESSMENT:  CLINICAL IMPRESSION: Pt had increased lateral breast pain near the incision after overhead press of 5# with her trainer this past week.  Discussed doing this without weight to start and then 2-3-5# progression.  Encouraged to keep trying other exercises. Still trouble relaxing with abduction PROM but decreased ttp in the axilla overall.   OBJECTIVE IMPAIRMENTS: decreased activity tolerance, decreased knowledge of condition, decreased knowledge of use of DME, decreased ROM, increased edema, and impaired UE functional use.   ACTIVITY LIMITATIONS: lifting  PARTICIPATION LIMITATIONS: job, gym  PERSONAL FACTORS: 1-2 comorbidities: SLNB, radiation  are also affecting patient's functional outcome.   REHAB POTENTIAL: Excellent  CLINICAL DECISION MAKING: Stable/uncomplicated  EVALUATION COMPLEXITY: Low  GOALS: Goals reviewed with patient? Yes  SHORT TERM GOALS=LTGs: Target date: 08/06/23  Pt will be ind with stretches for continued tightness post radiation Baseline: Goal status: INITIAL  2.  Pt will feel ready to work with trainer and include upper body exercises.  Baseline:  Goal status: INITIAL  3.  Pt will be ind with self MLD for the left breast  Baseline:  Goal status: INITIAL  4.  Pt will decrease breast edema questionnaire to 9/80 or less Baseline: 25 Goal status: INITIAL   PLAN:  PT FREQUENCY: 1-2x/week  PT DURATION: 4 weeks  PLANNED INTERVENTIONS: 97110-Therapeutic exercises, 97535- Self Care, [id number]- Manual therapy, Taping, Manual lymph drainage, DME instructions, Therapeutic exercises, Therapeutic activity, Neuromuscular re-education,  Gait training, and Self Care  PLAN FOR NEXT SESSION: stretches bil shoulders, supine foam roll series maybe without roll first, STM Lt axilla and scar tissue - try  small cupping eventually or taping  Larue Saddie SAUNDERS, PT 07/11/2023, 3:55 PM

## 2023-07-18 ENCOUNTER — Telehealth: Payer: Self-pay

## 2023-07-18 NOTE — Telephone Encounter (Signed)
Notified patient that her [employer] and medical accommodations documents had been completed and faxed back to company. Fax confirmation received. Copy of documents placed up front for pick up

## 2023-07-19 ENCOUNTER — Ambulatory Visit
Admission: RE | Admit: 2023-07-19 | Discharge: 2023-07-19 | Disposition: A | Discharge status: Home / Self Care | Payer: Managed Care, Other (non HMO) | Source: Ambulatory Visit | Attending: Adult Health | Admitting: Adult Health

## 2023-07-19 ENCOUNTER — Ambulatory Visit: Payer: Managed Care, Other (non HMO) | Admitting: Rehabilitation

## 2023-07-19 DIAGNOSIS — C50412 Malignant neoplasm of upper-outer quadrant of left female breast: Secondary | ICD-10-CM | POA: Diagnosis not present

## 2023-07-19 DIAGNOSIS — R600 Localized edema: Secondary | ICD-10-CM

## 2023-07-19 DIAGNOSIS — Z170 Estrogen receptor positive status [ER+]: Secondary | ICD-10-CM

## 2023-07-19 DIAGNOSIS — Z483 Aftercare following surgery for neoplasm: Secondary | ICD-10-CM

## 2023-07-19 DIAGNOSIS — R293 Abnormal posture: Secondary | ICD-10-CM

## 2023-07-19 DIAGNOSIS — Z9189 Other specified personal risk factors, not elsewhere classified: Secondary | ICD-10-CM

## 2023-07-19 HISTORY — DX: Personal history of antineoplastic chemotherapy: Z92.21

## 2023-07-19 HISTORY — DX: Personal history of irradiation: Z92.3

## 2023-07-19 NOTE — Therapy (Signed)
OUTPATIENT PHYSICAL THERAPY  UPPER EXTREMITY ONCOLOGY  Patient Name: Nicole Maddox MRN: [id number] DOB:04-Apr-1981, 43 y.o., female Today's Date: 07/19/2023  END OF SESSION:  PT End of Session - 07/19/23 1151     Visit Number 4    Number of Visits 9    Date for PT Re-Evaluation 08/06/23    PT Start Time 1101    PT Stop Time 1151    PT Time Calculation (min) 50 min    Activity Tolerance Patient tolerated treatment well    Behavior During Therapy [facility] for tasks assessed/performed               Past Medical History:  Diagnosis Date   Breast cancer (HCC) 06/23/2022   Family history of adverse reaction to anesthesia    mother with h/o slow to wake   Family history of stomach cancer    GERD (gastroesophageal reflux disease)    Hyperthyroidism    due to Graves' disease   Multinodular thyroid    Thyroid disease    Past Surgical History:  Procedure Laterality Date   BREAST BIOPSY Left 06/23/2022   [place] LT BREAST BX W LOC DEV 1ST LESION IMG BX SPEC US GUIDE 06/23/2022 [facility]   BREAST BIOPSY Left 01/02/2023   [place] LT RADIOACTIVE SEED LOC 01/02/2023 [facility]   BREAST BIOPSY  01/02/2023   MM RT RADIOACTIVE SEED LOC MAMMO GUIDE 01/02/2023 [facility]   BREAST LUMPECTOMY WITH RADIOACTIVE SEED AND SENTINEL LYMPH NODE BIOPSY Left 01/04/2023   Procedure: LEFT BREAST LUMPECTOMY WITH RADIOACTIVE SEED AND SENTINEL LYMPH NODE BIOPSY;  Surgeon: Griselda Miner, MD;  Location: [facility] OR;  Service: General;  Laterality: Left;  PEC BLOCK   BREAST LUMPECTOMY WITH RADIOACTIVE SEED LOCALIZATION Right 01/04/2023   Procedure: RIGHT BREAST LUMPECTOMY WITH RADIOACTIVE SEED LOCALIZATION;  Surgeon: Griselda Miner, MD;  Location: [facility] OR;  Service: General;  Laterality: Right;   BREAST REDUCTION WITH MASTOPEXY Bilateral 01/04/2023   Procedure: BREAST REDUCTION WITH MASTOPEXY;  Surgeon: Peggye Form, DO;  Location: [facility] OR;  Service: Plastics;  Laterality: Bilateral;   PORT-A-CATH REMOVAL  N/A 03/01/2023   Procedure: REMOVAL PORT-A-CATH;  Surgeon: Griselda Miner, MD;  Location: [facility];  Service: General;  Laterality: N/A;   PORTACATH PLACEMENT Right 07/19/2022   Procedure: INSERTION PORT-A-CATH;  Surgeon: Griselda Miner, MD;  Location: [facility];  Service: General;  Laterality: Right;  60 MIN ROOM 8   WISDOM TOOTH EXTRACTION     Patient Active Problem List   Diagnosis Date Noted   Port-A-Cath in place 07/20/2022   Genetic testing 07/07/2022   Family history of stomach cancer 06/29/2022   Malignant neoplasm of upper-outer quadrant of left female breast (HCC) 06/27/2022   Seborrheic dermatitis of scalp 02/25/2018    PCP: Dr. Aviva Signs  REFERRING PROVIDER: Dr. Rachel Moulds  REFERRING DIAG:  Diagnosis  C50.412,Z17.0 (ICD-10-CM) - Malignant neoplasm of upper-outer quadrant of left breast in female, estrogen receptor positive (HCC)    THERAPY DIAG:  At risk for lymphedema  Localized edema  Aftercare following surgery for neoplasm  Abnormal posture  Malignant neoplasm of upper-outer quadrant of left female breast, unspecified estrogen receptor status (HCC)  ONSET DATE: 06/23/2022  Rationale for Evaluation and Treatment: Rehabilitation  SUBJECTIVE:  SUBJECTIVE STATEMENT: I have been up and down since the last session. I am not going back to work out because of the pain.   EVAL: I am having swelling in the breast and it hurts to raise my arm.  It has been going on since after radiation.  I have the compression surgery bra.    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  Completed chemotherapy. Lt lumpectomy with 6 negative nodes. Completed radiation 03/29/23. On Capecitabine (Xeloda) chemo.   PAIN:  Are you having pain?  No  PRECAUTIONS: Lt lymphedema risk  RED FLAGS: None   WEIGHT BEARING RESTRICTIONS: No  FALLS:  Has patient fallen in last 6 months? No  LIVING ENVIRONMENT: Lives with: lives alone Lives in: House/apartment  OCCUPATION: on leave from mental health therapist  LEISURE: reading, shopping, light exercise with the trainer 2x per week   HAND DOMINANCE: right   PRIOR LEVEL OF FUNCTION: Independent  PATIENT GOALS: learn what to do for the upper body.     OBJECTIVE: Note: Objective measures were completed at Evaluation unless otherwise noted.  COGNITION: Overall cognitive status: Within functional limits for tasks assessed   PALPATION: Increased fibrosis along reduction incisions inferior and line to nipple as well as axillary incision. Rt incisions are slightly firm but not painful  OBSERVATIONS / OTHER ASSESSMENTS: Lt breast appears around the same size as the Rt but after radiation should be a bit smaller.  No enlarged pores  POSTURE: WNL  UPPER EXTREMITY AROM/PROM:  A/PROM baseline RIGHT   eval   Shoulder extension 51 55  Shoulder flexion 150 155  Shoulder abduction 180 165  Shoulder internal rotation 60 60  Shoulder external rotation 100 80    (Blank rows = not tested)  A/PROM baseline LEFT   eval  Shoulder extension 52 50  Shoulder flexion 160 160 - tight lat  Shoulder abduction 180 170 - tight lateral breast  Shoulder internal rotation 53   Shoulder external rotation 97 92    (Blank rows = not tested)   UPPER EXTREMITY STRENGTH: WNL  LYMPHEDEMA ASSESSMENTS:   L-DEX LYMPHEDEMA SCREENING: L-DEX LYMPHEDEMA SCREENING Measurement Type: Unilateral L-DEX MEASUREMENT EXTREMITY: Upper Extremity POSITION : Standing DOMINANT SIDE: Right At Risk Side: Left BASELINE SCORE (UNILATERAL): 0.4 L-DEX SCORE (UNILATERAL): 0 VALUE CHANGE (UNILAT): -0.4 Comment: 189, Rt permanent bracelet  QUICK DASH SURVEY: 36% from 2.27%  BREAST COMPLAINTS  QUESTIONNAIRE    06/25/23 Pain:   0 Heaviness:  5 Swollen feeling: 5 Tense Skin:  5 Redness:  0 Bra Print:  0 Size of Pores:  0 Hard feeling:   10 Total:   25  /80 A Score over 9 indicates lymphedema issues in the breast                                                                                                                             TREATMENT DATE:  07/19/23 Therapeutic Exercise Pulleys x into  flexion and abduction Wall ball flexion x 3 - pt reports pain in R arm and no discomfort in L  Supine alternating flexion x 10, snow angel x 10 with v/c to try and move LUE to point of a stretch past the comfortable zone to help improve ROM, discussed difference between stretching sensation and pain , pro/ret x 10, chest stretch x 60"  Manual Therapy  STM Left upper quadrant - pectoralis, axilla, lats, rhomboids, upper traps with numerous areas of muscle tightness noted PROM to L shoulder in to flexion and abduction with frequent v/c to relax as pt is very guarded  07/11/23 Therapeutic Exercise Pulleys with initial instruction x into flexion and abduction Wall ball flexion x 5  Supine alternating flexion x 6, snow angel x 6, pro/ret x 6, chest stretch x 60"  Manual Therapy  STM Left upper quadrant - pectoralis, axilla, lat with cocoa butter PROM with blocking at lat  07/02/23 Therapeutic Exercise Pulleys with initial instruction x into flexion and abduction with initial cueing and instruction Wall ball flexion x 5  Supine alternating flexion, snow angel x 6, horizontal abduction alternating x 6, pro/ret x 6, chest stretch x 60"  Manual Therapy  STM Left upper quadrant - pectoralis, axilla, lat with cocoa butter PROM with blocking at lat  06/25/23 Eval performed Instruction on exercises per below with performance x 2 of each. Pt concerned about scar tissue ripping so gave repeated reminders that this was not possible and showed her picture of radiation fibrosis to  demonstrate how if feels different after radiation.   Pt is already applying oil to the breast and doing massage and using compression bra.    PATIENT EDUCATION:  Education details: using tennis ball on wall in stockinette for self STM, difference between pain and stretching sensation  Person educated: Patient Education method: Explanation Education comprehension: verbalized understanding  HOME EXERCISE PROGRAM: Access Code: [email or web address] URL: [email or web address] Date: 06/25/2023 Prepared by: Gwenevere Abbot  Exercises - Single Arm Doorway Pec Stretch at 90 Degrees Abduction  - 1 x daily - 7 x weekly - 3 reps - 20-30 second hold - Doorway Pec Stretch at 90 Degrees Abduction  - 1 x daily - 7 x weekly - 1 sets - 3 reps - 20-30 seconds hold - Standing Shoulder Flexion Wall Walk  - 1 x daily - 7 x weekly - 1 sets - 5 reps - 5sec hold - Supine Chest Stretch with Elbows Bent  - 1 x daily - 7 x weekly - 1 sets - 3 reps - 30-60seconds hold  ASSESSMENT:  CLINICAL IMPRESSION: Continued ROM exercises today and educated importance of pushing in to a stretch and the difference between a stretch and pain. Pt guarded during PROM and requires frequent cues to relax muscles to allow PROM. Pt tolerated abduction with less guarding compared to flexion. Educated her in self STM using tennis ball in stockinette on well.   OBJECTIVE IMPAIRMENTS: decreased activity tolerance, decreased knowledge of condition, decreased knowledge of use of DME, decreased ROM, increased edema, and impaired UE functional use.   ACTIVITY LIMITATIONS: lifting  PARTICIPATION LIMITATIONS: job, gym  PERSONAL FACTORS: 1-2 comorbidities: SLNB, radiation  are also affecting patient's functional outcome.   REHAB POTENTIAL: Excellent  CLINICAL DECISION MAKING: Stable/uncomplicated  EVALUATION COMPLEXITY: Low  GOALS: Goals reviewed with patient? Yes  SHORT TERM GOALS=LTGs: Target date: 08/06/23  Pt will be ind with  stretches for continued tightness post radiation Baseline: Goal status: INITIAL  2.  Pt will feel ready to work with trainer and include upper body exercises.  Baseline:  Goal status: INITIAL  3.  Pt will be ind with self MLD for the left breast  Baseline:  Goal status: INITIAL  4.  Pt will decrease breast edema questionnaire to 9/80 or less Baseline: 25 Goal status: INITIAL   PLAN:  PT FREQUENCY: 1-2x/week  PT DURATION: 4 weeks  PLANNED INTERVENTIONS: 97110-Therapeutic exercises, 97535- Self Care, [id number]- Manual therapy, Taping, Manual lymph drainage, DME instructions, Therapeutic exercises, Therapeutic activity, Neuromuscular re-education, Gait training, and Self Care  PLAN FOR NEXT SESSION: stretches bil shoulders, supine foam roll series maybe without roll first, STM Lt axilla and scar tissue - try small cupping eventually or taping  [employer], PT 07/19/2023, 11:56 AM

## 2023-07-25 ENCOUNTER — Ambulatory Visit: Payer: Managed Care, Other (non HMO) | Admitting: Rehabilitation

## 2023-07-25 ENCOUNTER — Encounter: Payer: Self-pay | Admitting: Rehabilitation

## 2023-07-25 DIAGNOSIS — Z9189 Other specified personal risk factors, not elsewhere classified: Secondary | ICD-10-CM

## 2023-07-25 DIAGNOSIS — C50412 Malignant neoplasm of upper-outer quadrant of left female breast: Secondary | ICD-10-CM

## 2023-07-25 DIAGNOSIS — R600 Localized edema: Secondary | ICD-10-CM

## 2023-07-25 DIAGNOSIS — Z483 Aftercare following surgery for neoplasm: Secondary | ICD-10-CM

## 2023-07-25 DIAGNOSIS — R293 Abnormal posture: Secondary | ICD-10-CM

## 2023-07-25 NOTE — Therapy (Signed)
OUTPATIENT PHYSICAL THERAPY  UPPER EXTREMITY ONCOLOGY  Patient Name: Nicole HADSALL MRN: [id number] DOB:November 17, 1980, 43 y.o., female Today's Date: 07/25/2023  END OF SESSION:  PT End of Session - 07/25/23 0949     Visit Number 5    Number of Visits 9    Date for PT Re-Evaluation 08/06/23    PT Start Time 0952    PT Stop Time 1045    PT Time Calculation (min) 53 min    Activity Tolerance Patient tolerated treatment well    Behavior During Therapy [facility] for tasks assessed/performed               Past Medical History:  Diagnosis Date   Breast cancer (HCC) 06/23/2022   Family history of adverse reaction to anesthesia    mother with h/o slow to wake   Family history of stomach cancer    GERD (gastroesophageal reflux disease)    Hyperthyroidism    due to Graves' disease   Multinodular thyroid    Personal history of chemotherapy    Personal history of radiation therapy    Thyroid disease    Past Surgical History:  Procedure Laterality Date   BREAST BIOPSY Left 06/23/2022   [place] LT BREAST BX W LOC DEV 1ST LESION IMG BX SPEC US GUIDE 06/23/2022 [facility]   BREAST BIOPSY Left 01/02/2023   [place] LT RADIOACTIVE SEED LOC 01/02/2023 [facility]   BREAST BIOPSY  01/02/2023   MM RT RADIOACTIVE SEED LOC MAMMO GUIDE 01/02/2023 [facility]   BREAST LUMPECTOMY Left 01/04/2023   BREAST LUMPECTOMY WITH RADIOACTIVE SEED AND SENTINEL LYMPH NODE BIOPSY Left 01/04/2023   Procedure: LEFT BREAST LUMPECTOMY WITH RADIOACTIVE SEED AND SENTINEL LYMPH NODE BIOPSY;  Surgeon: Griselda Miner, MD;  Location: [facility] OR;  Service: General;  Laterality: Left;  PEC BLOCK   BREAST LUMPECTOMY WITH RADIOACTIVE SEED LOCALIZATION Right 01/04/2023   Procedure: RIGHT BREAST LUMPECTOMY WITH RADIOACTIVE SEED LOCALIZATION;  Surgeon: Griselda Miner, MD;  Location[facility] OR;  Service: General;  Laterality: Right;   BREAST REDUCTION WITH MASTOPEXY Bilateral 01/04/2023   Procedure: BREAST REDUCTION WITH  MASTOPEXY;  Surgeon: Peggye Form, DO;  Location: [facility] OR;  Service: Plastics;  Laterality: Bilateral;   PORT-A-CATH REMOVAL N/A 03/01/2023   Procedure: REMOVAL PORT-A-CATH;  Surgeon: Griselda Miner, MD;  Location: [facility];  Service: General;  Laterality: N/A;   PORTACATH PLACEMENT Right 07/19/2022   Procedure: INSERTION PORT-A-CATH;  Surgeon: Griselda Miner, MD;  Location: [facility];  Service: General;  Laterality: Right;  60 MIN ROOM 8   WISDOM TOOTH EXTRACTION     Patient Active Problem List   Diagnosis Date Noted   Port-A-Cath in place 07/20/2022   Genetic testing 07/07/2022   Family history of stomach cancer 06/29/2022   Malignant neoplasm of upper-outer quadrant of left female breast (HCC) 06/27/2022   Seborrheic dermatitis of scalp 02/25/2018    PCP: Dr. Aviva Signs  REFERRING PROVIDER: Dr. Rachel Moulds  REFERRING DIAG:  Diagnosis  C50.412,Z17.0 (ICD-10-CM) - Malignant neoplasm of upper-outer quadrant of left breast in female, estrogen receptor positive (HCC)    THERAPY DIAG:  At risk for lymphedema  Localized edema  Aftercare following surgery for neoplasm  Abnormal posture  Malignant neoplasm of upper-outer quadrant of left female breast, unspecified estrogen receptor status (HCC)  ONSET DATE: 06/23/2022  Rationale for Evaluation and Treatment: Rehabilitation  SUBJECTIVE:  SUBJECTIVE STATEMENT:   It feels the same.  Still the worst when I raise my arm.    EVAL: I am having swelling in the breast and it hurts to raise my arm.  It has been going on since after radiation.  I have the compression surgery bra.    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  Completed chemotherapy. Lt lumpectomy with 6 negative nodes. Completed  radiation 03/29/23. On Capecitabine (Xeloda) chemo.   PAIN:  Are you having pain? No  PRECAUTIONS: Lt lymphedema risk  RED FLAGS: None   WEIGHT BEARING RESTRICTIONS: No  FALLS:  Has patient fallen in last 6 months? No  LIVING ENVIRONMENT: Lives with: lives alone Lives in: House/apartment  OCCUPATION: on leave from mental health therapist  LEISURE: reading, shopping, light exercise with the trainer 2x per week   HAND DOMINANCE: right   PRIOR LEVEL OF FUNCTION: Independent  PATIENT GOALS: learn what to do for the upper body.     OBJECTIVE: Note: Objective measures were completed at Evaluation unless otherwise noted.  COGNITION: Overall cognitive status: Within functional limits for tasks assessed   PALPATION: Increased fibrosis along reduction incisions inferior and line to nipple as well as axillary incision. Rt incisions are slightly firm but not painful  OBSERVATIONS / OTHER ASSESSMENTS: Lt breast appears around the same size as the Rt but after radiation should be a bit smaller.  No enlarged pores  POSTURE: WNL  UPPER EXTREMITY AROM/PROM:  A/PROM baseline RIGHT   eval   Shoulder extension 51 55  Shoulder flexion 150 155  Shoulder abduction 180 165  Shoulder internal rotation 60 60  Shoulder external rotation 100 80    (Blank rows = not tested)  A/PROM baseline LEFT   eval  Shoulder extension 52 50  Shoulder flexion 160 160 - tight lat  Shoulder abduction 180 170 - tight lateral breast  Shoulder internal rotation 53   Shoulder external rotation 97 92    (Blank rows = not tested)   UPPER EXTREMITY STRENGTH: WNL  LYMPHEDEMA ASSESSMENTS:   L-DEX LYMPHEDEMA SCREENING: L-DEX LYMPHEDEMA SCREENING Measurement Type: Unilateral L-DEX MEASUREMENT EXTREMITY: Upper Extremity POSITION : Standing DOMINANT SIDE: Right At Risk Side: Left BASELINE SCORE (UNILATERAL): 0.4 L-DEX SCORE (UNILATERAL): 0 VALUE CHANGE (UNILAT): -0.4 Comment: 189, Rt permanent  bracelet  QUICK DASH SURVEY: 36% from 2.27%  BREAST COMPLAINTS QUESTIONNAIRE    06/25/23 Pain:   0 Heaviness:  5 Swollen feeling: 5 Tense Skin:  5 Redness:  0 Bra Print:  0 Size of Pores:  0 Hard feeling:   10 Total:   25  /80 A Score over 9 indicates lymphedema issues in the breast                                                                                                                             TREATMENT DATE:  07/25/23 Therapeutic Exercise Pulleys x into flexion and abduction with cueing to  move into full motion and to slow down to focus on the stretch.   Wall ball flexion x 5 Wall single arm chest stretch 2x20" - needing reassurance that the mm in the arm will not rip Tricep stretch with hand to the back of the head only 2x20" Row yellow x 10 with initial instruction Supine dowel flexion x 5 narrow and wide, snow angel x 5, chest stretch x 60"  Manual Therapy  STM Left upper quadrant - pectoralis, axilla, lats, rhomboids, upper traps  PROM to L shoulder in to flexion and abduction without need for relaxation cues today  07/19/23 Therapeutic Exercise Pulleys x into flexion and abduction Wall ball flexion x 3 - pt reports pain in R arm and no discomfort in L  Supine alternating flexion x 10, snow angel x 10 with v/c to try and move LUE to point of a stretch past the comfortable zone to help improve ROM, discussed difference between stretching sensation and pain , pro/ret x 10, chest stretch x 60"  Manual Therapy  STM Left upper quadrant - pectoralis, axilla, lats, rhomboids, upper traps with numerous areas of muscle tightness noted PROM to L shoulder in to flexion and abduction with frequent v/c to relax as pt is very guarded  07/11/23 Therapeutic Exercise Pulleys with initial instruction x into flexion and abduction Wall ball flexion x 5  Supine alternating flexion x 6, snow angel x 6, pro/ret x 6, chest stretch x 60"  Manual Therapy  STM Left upper  quadrant - pectoralis, axilla, lat with cocoa butter PROM with blocking at lat  07/02/23 Therapeutic Exercise Pulleys with initial instruction x into flexion and abduction with initial cueing and instruction Wall ball flexion x 5  Supine alternating flexion, snow angel x 6, horizontal abduction alternating x 6, pro/ret x 6, chest stretch x 60"  Manual Therapy  STM Left upper quadrant - pectoralis, axilla, lat with cocoa butter PROM with blocking at lat  06/25/23 Eval performed Instruction on exercises per below with performance x 2 of each. Pt concerned about scar tissue ripping so gave repeated reminders that this was not possible and showed her picture of radiation fibrosis to demonstrate how if feels different after radiation.   Pt is already applying oil to the breast and doing massage and using compression bra.    PATIENT EDUCATION:  Education details: using tennis ball on wall in stockinette for self STM, difference between pain and stretching sensation  Person educated: Patient Education method: Explanation Education comprehension: verbalized understanding  HOME EXERCISE PROGRAM: Access Code: [email or web address] URL: [email or web address] Date: 06/25/2023 Prepared by: Gwenevere Abbot  Exercises - Single Arm Doorway Pec Stretch at 90 Degrees Abduction  - 1 x daily - 7 x weekly - 3 reps - 20-30 second hold - Doorway Pec Stretch at 90 Degrees Abduction  - 1 x daily - 7 x weekly - 1 sets - 3 reps - 20-30 seconds hold - Standing Shoulder Flexion Wall Walk  - 1 x daily - 7 x weekly - 1 sets - 5 reps - 5sec hold - Supine Chest Stretch with Elbows Bent  - 1 x daily - 7 x weekly - 1 sets - 3 reps - 30-60seconds hold  ASSESSMENT:  CLINICAL IMPRESSION:  Noted more forearm pulling today with stretches - no cording felt, but could be this.  Pt is doing better with pushing into discomfort now   OBJECTIVE IMPAIRMENTS: decreased activity tolerance, decreased knowledge of condition,  decreased knowledge  of use of DME, decreased ROM, increased edema, and impaired UE functional use.   ACTIVITY LIMITATIONS: lifting  PARTICIPATION LIMITATIONS: job, gym  PERSONAL FACTORS: 1-2 comorbidities: SLNB, radiation  are also affecting patient's functional outcome.   REHAB POTENTIAL: Excellent  CLINICAL DECISION MAKING: Stable/uncomplicated  EVALUATION COMPLEXITY: Low  GOALS: Goals reviewed with patient? Yes  SHORT TERM GOALS=LTGs: Target date: 08/06/23  Pt will be ind with stretches for continued tightness post radiation Baseline: Goal status: INITIAL  2.  Pt will feel ready to work with trainer and include upper body exercises.  Baseline:  Goal status: INITIAL  3.  Pt will be ind with self MLD for the left breast  Baseline:  Goal status: INITIAL  4.  Pt will decrease breast edema questionnaire to 9/80 or less Baseline: 25 Goal status: INITIAL   PLAN:  PT FREQUENCY: 1-2x/week  PT DURATION: 4 weeks  PLANNED INTERVENTIONS: 97110-Therapeutic exercises, 97535- Self Care, 19147- Manual therapy, Taping, Manual lymph drainage, DME instructions, Therapeutic exercises, Therapeutic activity, Neuromuscular re-education, Gait training, and Self Care  PLAN FOR NEXT SESSION: stretches bil shoulders, supine foam roll series maybe without roll first, STM Lt axilla and scar tissue - try small cupping eventually or taping  Firman Petrow, Nicole Manson, PT 07/25/2023, 11:11 AM

## 2023-07-26 ENCOUNTER — Encounter: Payer: Managed Care, Other (non HMO) | Admitting: Rehabilitation

## 2023-08-01 ENCOUNTER — Inpatient Hospital Stay (HOSPITAL_BASED_OUTPATIENT_CLINIC_OR_DEPARTMENT_OTHER): Payer: Managed Care, Other (non HMO) | Admitting: Hematology and Oncology

## 2023-08-01 ENCOUNTER — Inpatient Hospital Stay: Payer: Managed Care, Other (non HMO)

## 2023-08-01 VITALS — BP 132/85 | HR 91 | Temp 98.0°F | Resp 18 | Ht 64.0 in | Wt 191.3 lb

## 2023-08-01 DIAGNOSIS — C50412 Malignant neoplasm of upper-outer quadrant of left female breast: Secondary | ICD-10-CM

## 2023-08-01 DIAGNOSIS — Z170 Estrogen receptor positive status [ER+]: Secondary | ICD-10-CM

## 2023-08-01 LAB — CBC WITH DIFFERENTIAL/PLATELET
Abs Immature Granulocytes: 0.05 10*3/uL (ref 0.00–0.07)
Basophils Absolute: 0.1 10*3/uL (ref 0.0–0.1)
Basophils Relative: 1 %
Eosinophils Absolute: 0.3 10*3/uL (ref 0.0–0.5)
Eosinophils Relative: 6 %
HCT: 29.5 % — ABNORMAL LOW (ref 36.0–46.0)
Hemoglobin: 9.9 g/dL — ABNORMAL LOW (ref 12.0–15.0)
Immature Granulocytes: 1 %
Lymphocytes Relative: 30 %
Lymphs Abs: 1.7 10*3/uL (ref 0.7–4.0)
MCH: 27.9 pg (ref 26.0–34.0)
MCHC: 33.6 g/dL (ref 30.0–36.0)
MCV: 83.1 fL (ref 80.0–100.0)
Monocytes Absolute: 0.3 10*3/uL (ref 0.1–1.0)
Monocytes Relative: 5 %
Neutro Abs: 3.3 10*3/uL (ref 1.7–7.7)
Neutrophils Relative %: 57 %
Platelets: 267 10*3/uL (ref 150–400)
RBC: 3.55 MIL/uL — ABNORMAL LOW (ref 3.87–5.11)
RDW: 28.3 % — ABNORMAL HIGH (ref 11.5–15.5)
WBC: 5.7 10*3/uL (ref 4.0–10.5)
nRBC: 0.7 % — ABNORMAL HIGH (ref 0.0–0.2)

## 2023-08-01 LAB — CMP (CANCER CENTER ONLY)
ALT: 23 U/L (ref 0–44)
AST: 25 U/L (ref 15–41)
Albumin: 4.1 g/dL (ref 3.5–5.0)
Alkaline Phosphatase: 63 U/L (ref 38–126)
Anion gap: 5 (ref 5–15)
BUN: 10 mg/dL (ref 6–20)
CO2: 26 mmol/L (ref 22–32)
Calcium: 8.6 mg/dL — ABNORMAL LOW (ref 8.9–10.3)
Chloride: 107 mmol/L (ref 98–111)
Creatinine: 0.75 mg/dL (ref 0.44–1.00)
GFR, Estimated: >60 mL/min (ref >60)
Glucose, Bld: 138 mg/dL — ABNORMAL HIGH (ref 70–99)
Potassium: 3.1 mmol/L — ABNORMAL LOW (ref 3.5–5.1)
Sodium: 138 mmol/L (ref 135–145)
Total Bilirubin: 0.5 mg/dL (ref 0.0–1.2)
Total Protein: 6.5 g/dL (ref 6.5–8.1)

## 2023-08-01 LAB — TSH: TSH: 0.368 u[IU]/mL (ref 0.350–4.500)

## 2023-08-01 NOTE — Progress Notes (Signed)
 BRIEF ONCOLOGIC HISTORY:   Oncology History  Malignant neoplasm of upper-outer quadrant of left female breast (HCC)  06/23/2022 Initial Diagnosis   Left breast needle core biopsy at 1:00, 10 cmfn: IDC< grade 3, 1.1 cm, ER 30% positive, weak staining intensity, PR negative, Ki-67 95%, HER2 negative.  Axillary node negative.    06/28/2022 Cancer Staging   Staging form: Breast, AJCC 8th Edition - Clinical stage from 06/28/2022: Stage IB (cT1c, cN0(f), cM0, G3, ER+, PR-, HER2-) - Signed by Ronny Bacon, PA-C on 06/28/2022 Stage prefix: Initial diagnosis Method of lymph node assessment: Core biopsy Histologic grading system: 3 grade system   07/11/2022 Genetic Testing   Negative genetic testing on the 9 gene STAT panel.  The report date is July 05, 2022. Negative genetic testing on the Multi-cancer panel.  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  BLM c.3136G>A (p.Gly1046Ser), EGFR c.61G>A (p.Ala21Thr) and PDGFRA c.50G>T (p.Gly17Val) VUS identified.  The report date is July 11, 2022.  The STAT Breast cancer panel offered by [facility] includes sequencing and rearrangement analysis for the following 9 genes:  ATM, BRCA1, BRCA2, CDH1, CHEK2, PALB2, PTEN, STK11 and TP53.   The Multi-Cancer + RNA Panel offered by [facility] includes sequencing and/or deletion/duplication analysis of the following 70 genes:  AIP*, ALK, APC*, ATM*, AXIN2*, BAP1*, BARD1*, BLM*, BMPR1A*, BRCA1*, BRCA2*, BRIP1*, CDC73*, CDH1*, CDK4, CDKN1B*, CDKN2A, CHEK2*, CTNNA1*, DICER1*, EPCAM (del/dup only), EGFR, FH*, FLCN*, GREM1 (promoter dup only), HOXB13, KIT, LZTR1, MAX*, MBD4, MEN1*, MET, MITF, MLH1*, MSH2*, MSH3*, MSH6*, MUTYH*, NF1*, NF2*, NTHL1*, PALB2*, PDGFRA, PMS2*, POLD1*, POLE*, POT1*, PRKAR1A*, PTCH1*, PTEN*, RAD51C*, RAD51D*, RB1*, RET, SDHA* (sequencing only), SDHAF2*, SDHB*, SDHC*, SDHD*, SMAD4*, SMARCA4*, SMARCB1*, SMARCE1*, STK11*, SUFU*, TMEM127*, TP53*, TSC1*, TSC2*, VHL*. RNA analysis is performed for * genes.    07/20/2022 -  11/30/2022 Chemotherapy   Patient is on Treatment Plan : BREAST ADJUVANT DOSE DENSE AC q14d / PACLitaxel q7d     07/21/2022 Procedure   Right breast needle core biopsy upper outer breast x 2: Negative for malignancy, left breast needle core biopsy lower: Negative for malignancy.   08/07/2022 Procedure   2 additional right breast needle core biopsies both negative for malignancy   01/04/2023 Surgery   Left breast lumpectomy: Invasive ductal carcinoma, 1.6 cm, grade 3, margins negative, 6 lymph nodes negative for malignancy.  Repeat prognostic panel: Triple negative breast cancer with a Ki67 of 90%.  Right breast lumpectomy: Negative for carcinoma.  Patient also underwent bilateral mammoplasties and path was negative for carcinoma on both specimen submitted.    - 03/29/2023 Radiation Therapy   Left breast: 40.05 Gy in 15 treatments "Boost": 10 cGy in 5 treatments    04/17/2023 -  Chemotherapy   Patient is on Treatment Plan : BREAST Capecitabine q21d       INTERVAL HISTORY:  Nicole Maddox is here for follow up.  Discussed the use of AI scribe software for clinical note transcription with the patient, who gave verbal consent to proceed.  History of Present Illness    Nicole Maddox is a 43 year old female who presents with symptoms potentially related to Xeloda treatment.  She is currently on her fourth cycle of Xeloda, taking it for fourteen days followed by a week off. She experiences multiple side effects from the medication, including generalized skeletal pain, particularly when lying down, which she rates as a six out of ten in severity. This pain is described as a 'stick pen' sensation and occurs in her arms and legs, making them  feel heavy. She takes Tylenol and salt baths for relief. She also experiences a woozy feeling after taking her medication, similar to being drunk, and wonders if it is related to not eating enough.  She reports experiencing a sensation of fullness or 'liquid' in her  stomach after eating, which feels like it needs to come out but does not result in vomiting. She describes this sensation as 'icky' and is uncertain if it qualifies as nausea. This symptom has been present since starting Xeloda.  She experiences sharp, intermittent pain in her right breast. She recently had a mammogram which was normal. She speculates that the pain might be related to menstruation returning.  Her appetite remains 'iffy' and food tastes bland. She reports a dry mouth and skin issues, including flaky and patchy skin with new spots developing. Her hands and feet are darkened, and she is unsure about the condition of her nails due to nail polish.  She reports diarrhea occurring once a day, typically in the morning, but does not require Imodium. She tries to eat small meals to manage this symptom.  Rest of the pertinent 10 point ROS reviewed and neg.  PAST MEDICAL/SURGICAL HISTORY:  Past Medical History:  Diagnosis Date   Breast cancer (HCC) 06/23/2022   Family history of adverse reaction to anesthesia    mother with h/o slow to wake   Family history of stomach cancer    GERD (gastroesophageal reflux disease)    Hyperthyroidism    due to Graves' disease   Multinodular thyroid    Personal history of chemotherapy    Personal history of radiation therapy    Thyroid disease    Past Surgical History:  Procedure Laterality Date   BREAST BIOPSY Left 06/23/2022   [place] LT BREAST BX W LOC DEV 1ST LESION IMG BX SPEC US GUIDE 06/23/2022 [facility]MO[facility]   BREAST BIOPSY Left 01/02/2023   [place] LT RADIOACTIVE SEED LOC 01/02/2023 [facility]   BREAST BIOPSY  01/02/2023   MM RT RADIOACTIVE SEED LOC MAMMO GUIDE 01/02/2023 [facility]   BREAST LUMPECTOMY Left 01/04/2023   BREAST LUMPECTOMY WITH RADIOACTIVE SEED AND SENTINEL LYMPH NODE BIOPSY Left 01/04/2023   Procedure: LEFT BREAST LUMPECTOMY WITH RADIOACTIVE SEED AND SENTINEL LYMPH NODE BIOPSY;  Surgeon: Griselda Miner, MD;   Location: [facility] OR;  Service: General;  Laterality: Left;  PEC BLOCK   BREAST LUMPECTOMY WITH RADIOACTIVE SEED LOCALIZATION Right 01/04/2023   Procedure: RIGHT BREAST LUMPECTOMY WITH RADIOACTIVE SEED LOCALIZATION;  Surgeon: Griselda Miner, MD;  Location: [facility] OR;  Service: General;  Laterality: Right;   BREAST REDUCTION WITH MASTOPEXY Bilateral 01/04/2023   Procedure: BREAST REDUCTION WITH MASTOPEXY;  Surgeon: Peggye Form, DO;  Location: [facility] OR;  Service: Plastics;  Laterality: Bilateral;   PORT-A-CATH REMOVAL N/A 03/01/2023   Procedure: REMOVAL PORT-A-CATH;  Surgeon: Griselda Miner, MD;  Location: [facility];  Service: General;  Laterality: N/A;   PORTACATH PLACEMENT Right 07/19/2022   Procedure: INSERTION PORT-A-CATH;  Surgeon: Griselda Miner, MD;  Location: [facility];  Service: General;  Laterality: Right;  60 MIN ROOM 8   WISDOM TOOTH EXTRACTION       ALLERGIES:  Allergies  Allergen Reactions   Adriamycin [Doxorubicin] Other (See Comments)    Patient c/o of lower back pain and right arm pain. See progress note from 07/20/22     CURRENT MEDICATIONS:  Outpatient Encounter Medications as of 08/01/2023  Medication Sig   capecitabine (XELODA) 500 MG  tablet Take 4 tablets (2,000 mg total) by mouth 2 (two) times daily after a meal. Take on days 1-14. Repeat every 21 days.   COD LIVER OIL PO Take by mouth daily.   Cyanocobalamin (VITAMIN B-12 PO) Take by mouth daily.   DENTA 5000 PLUS 1.1 % CREA dental cream Place 1 Application onto teeth daily.   diclofenac Sodium (VOLTAREN) 1 % GEL Research Patient: Apply 0.5 grams (1 fingertip) to each hand and each foot twice daily for up to 12 weeks   folic acid (FOLVITE) 1 MG tablet Take 1 mg by mouth daily.   loratadine (CLARITIN) 10 MG tablet Take 10 mg by mouth daily.   metFORMIN (GLUCOPHAGE) 500 MG tablet    methimazole (TAPAZOLE) 5 MG tablet Take 15 mg by mouth 2 (two) times daily.   omeprazole (PRILOSEC) 40 MG  capsule Take 40 mg by mouth daily.   No facility-administered encounter medications on file as of 08/01/2023.     ONCOLOGIC FAMILY HISTORY:  Family History  Problem Relation Age of Onset   Heart attack Father 30   Stomach cancer Maternal Aunt        dx > 50   Throat cancer Maternal Uncle        dx 26s   Stomach cancer Maternal Uncle        dx > 50   Heart Problems Maternal Grandmother    Breast cancer Cousin        mother's maternal first cousin     SOCIAL HISTORY:  Social History   Socioeconomic History   Marital status: Single    Spouse name: Not on file   Number of children: Not on file   Years of education: Not on file   Highest education level: Not on file  Occupational History   Not on file  Tobacco Use   Smoking status: Never   Smokeless tobacco: Never  Vaping Use   Vaping status: Never Used  Substance and Sexual Activity   Alcohol use: Yes    Comment: socially   Drug use: Never   Sexual activity: Not Currently    Partners: Male    Birth control/protection: Condom    Comment: intercourse age 10, more than 6 sexual parters,   Other Topics Concern   Not on file  Social History Narrative   Not on file   Social Drivers of Health   Financial Resource Strain: Low Risk  (07/04/2022)   Overall Financial Resource Strain (CARDIA)    Difficulty of Paying Living Expenses: Not very hard  Food Insecurity: No Food Insecurity (06/28/2022)   Hunger Vital Sign    Worried About Running Out of Food in the Last Year: Never true    Ran Out of Food in the Last Year: Never true  Transportation Needs: No Transportation Needs (06/28/2022)   PRAPARE - Administrator, Civil Service (Medical): No    Lack of Transportation (Non-Medical): No  Physical Activity: Not on file  Stress: Not on file  Social Connections: Unknown (01/09/2023)   Received from [facility]   Social Connections    Frequency of Communication with Friends and Family: Not asked    Frequency of  Social Gatherings with Friends and Family: Not asked  Intimate Partner Violence: Unknown (01/09/2023)   Received from [facility]   Intimate Partner Violence    Fear of Current or Ex-Partner: Not asked    Emotionally Abused: Not asked    Physically Abused: Not asked  Sexually Abused: Not asked    OBSERVATIONS/OBJECTIVE:  Physical Exam Constitutional:      Appearance: Normal appearance.  Cardiovascular:     Rate and Rhythm: Normal rate and regular rhythm.     Pulses: Normal pulses.     Heart sounds: Normal heart sounds.  Pulmonary:     Effort: Pulmonary effort is normal.     Breath sounds: Normal breath sounds.  Musculoskeletal:        General: No swelling. Normal range of motion.     Cervical back: Normal range of motion.  Lymphadenopathy:     Cervical: No cervical adenopathy.  Skin:    General: Skin is warm and dry.  Neurological:     General: No focal deficit present.     Mental Status: She is alert.      LABORATORY DATA:  None for this visit.  DIAGNOSTIC IMAGING:  None for this visit.   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  She received neoadjuvant AC-T.  She tolerated chemotherapy very well.  Final surgery showed residual IDC with focal sarcomatoid changes measuring 1.6 cm grade 3, high-grade DCIS, negative margins, no lymph node involvement no evidence of lymphovascular or perineural invasion in the primary tumor.   Breast Cancer on Xeloda Experiencing multiple side effects including nausea, skin rash, and generalized pain. Patient is on cycle 4 of Xeloda. -Reduce Xeloda dose to 3 pills in the morning and 3 pills in the evening. -Consider anti-nausea medication as needed. -Continue monitoring skin rash and  report any significant changes.  Right Breast Pain Intermittent sharp pain in the right breast. Recent mammogram was normal. -Attributed to possible nerve reconnection post-surgery.  Dry Mouth Likely a side effect of Xeloda. -Encourage hydration and consider saliva substitutes if necessary.  Skin rash, likely from xeloda, continue to monitor with dose reduction  Arthralgias and intermittent breast pain, could be related to temp menopausal status from chemotherapy.  General Health Maintenance -Continue monitoring with the Guardant reveal test every six months. -Return for follow-up in three weeks.  Follow-up in 3 weeks.  Time spent: 30 min *Total Encounter Time as defined by the Centers for Medicare and Medicaid Services includes, in addition to the face-to-face time of a patient visit (documented in the note above) non-face-to-face time: obtaining and reviewing outside history, ordering and reviewing medications, tests or procedures, care coordination (communications with other health care professionals or caregivers) and documentation in the medical record.

## 2023-08-08 ENCOUNTER — Encounter: Payer: Self-pay | Admitting: Bariatrics

## 2023-08-08 ENCOUNTER — Ambulatory Visit (INDEPENDENT_AMBULATORY_CARE_PROVIDER_SITE_OTHER): Payer: Managed Care, Other (non HMO) | Admitting: Bariatrics

## 2023-08-08 VITALS — BP 109/73 | HR 77 | Temp 98.0°F | Ht 64.0 in | Wt 184.0 lb

## 2023-08-08 DIAGNOSIS — E559 Vitamin D deficiency, unspecified: Secondary | ICD-10-CM | POA: Insufficient documentation

## 2023-08-08 DIAGNOSIS — R7309 Other abnormal glucose: Secondary | ICD-10-CM | POA: Diagnosis not present

## 2023-08-08 DIAGNOSIS — Z6831 Body mass index (BMI) 31.0-31.9, adult: Secondary | ICD-10-CM | POA: Diagnosis not present

## 2023-08-08 DIAGNOSIS — E669 Obesity, unspecified: Secondary | ICD-10-CM | POA: Diagnosis not present

## 2023-08-08 DIAGNOSIS — E66811 Obesity, class 1: Secondary | ICD-10-CM

## 2023-08-08 DIAGNOSIS — Z0289 Encounter for other administrative examinations: Secondary | ICD-10-CM

## 2023-08-08 NOTE — Progress Notes (Signed)
 Office: [phone]  /  Fax: [phone]   Initial Visit  [name] was seen in clinic today to evaluate for obesity. She is interested in losing weight to improve overall health and reduce the risk of weight related complications. She presents today to review program treatment options, initial physical assessment, and evaluation.     She was referred by: Specialist (oncologist)  When asked what else they would like to accomplish? She states: Adopt healthier eating patterns, Improve energy levels and physical activity, Improve existing medical conditions, and Improve quality of life  When asked how has your weight affected you? She states: Contributed to medical problems, Having fatigue, and Having poor endurance  Some associated conditions: Other: Hypothyroidism   Contributing factors: Family history of obesity, Reduced physical activity, and Eating patterns  Weight promoting medications identified: Steroids  Current nutrition plan: Portion control / smart choices and Other: gym, Vegan,   Current level of physical activity: None, Walking 15-30 minutes, and Step counting 10,000  Current or previous pharmacotherapy: GLP-1   Response to medication: Had side effects so it was discontinued   Past medical history includes:   Past Medical History:  Diagnosis Date   Breast cancer (HCC) 06/23/2022   Family history of adverse reaction to anesthesia    mother with h/o slow to wake   Family history of stomach cancer    GERD (gastroesophageal reflux disease)    Hyperthyroidism    due to Graves' disease   Multinodular thyroid    Personal history of chemotherapy    Personal history of radiation therapy    Thyroid disease      Objective:   BP 109/73   Pulse 77   Temp 98 F (36.7 C)   Ht 5\' 4"  (1.626 m)   Wt 184 lb (83.5 kg)   LMP 07/07/2023 (Exact Date)   SpO2 100%   BMI 31.58 kg/m  She was weighed on the bioimpedance scale: Body mass index is 31.58 kg/m.  Peak  Weight:191 , Body Fat%:37.9%, Visceral Fat Rating:8, Weight trend over the last 12 months: Unchanged ( fluctuating ).   General:  Alert, oriented and cooperative. Patient is in no acute distress.  Respiratory: Normal respiratory effort, no problems with respiration noted  Extremities: Normal range of motion.    Mental Status: Normal mood and affect. Normal behavior. Normal judgment and thought content.   DIAGNOSTIC DATA REVIEWED:  BMET    Component Value Date/Time   NA 138 08/01/2023 1244   K 3.1 (L) 08/01/2023 1244   CL 107 08/01/2023 1244   CO2 26 08/01/2023 1244   GLUCOSE 138 (H) 08/01/2023 1244   BUN 10 08/01/2023 1244   CREATININE 0.75 08/01/2023 1244   CALCIUM 8.6 (L) 08/01/2023 1244   GFRNONAA >60 08/01/2023 1244   No results found for: "HGBA1C" No results found for: "INSULIN" CBC    Component Value Date/Time   WBC 5.7 08/01/2023 1244   RBC 3.55 (L) 08/01/2023 1244   HGB 9.9 (L) 08/01/2023 1244   HGB 10.8 (L) 11/30/2022 0758   HCT 29.5 (L) 08/01/2023 1244   PLT 267 08/01/2023 1244   PLT 294 11/30/2022 0758   MCV 83.1 08/01/2023 1244   MCH 27.9 08/01/2023 1244   MCHC 33.6 08/01/2023 1244   RDW 28.3 (H) 08/01/2023 1244   Iron/TIBC/Ferritin/ %Sat No results found for: "IRON", "TIBC", "FERRITIN", "IRONPCTSAT" Lipid Panel  No results found for: "CHOL", "TRIG", "HDL", "CHOLHDL", "VLDL", "LDLCALC", "LDLDIRECT" Hepatic Function Panel  Component Value Date/Time   PROT 6.5 08/01/2023 1244   ALBUMIN 4.1 08/01/2023 1244   AST 25 08/01/2023 1244   ALT 23 08/01/2023 1244   ALKPHOS 63 08/01/2023 1244   BILITOT 0.5 08/01/2023 1244      Component Value Date/Time   TSH 0.368 08/01/2023 1244    Assessment and Plan:   Elevated glucose:   Her last glucose was 138.   Plan: Will check her A1c and insulin levels at her first visit.    Generalized Obesity: Current BMI 31.54    Obesity Treatment / Action Plan:  Patient will work on garnering support from family  and friends to begin weight loss journey. Will work on eliminating or reducing the presence of highly palatable, calorie dense foods in the home. Will complete provided nutritional and psychosocial assessment questionnaire before the next appointment. Will be scheduled for indirect calorimetry to determine resting energy expenditure in a fasting state.  This will allow [place] to create a reduced calorie, high-protein meal plan to promote loss of fat mass while preserving muscle mass. Counseled on the health benefits of losing 5%-15% of total body weight. Was counseled on nutritional approaches to weight loss and benefits of reducing processed foods and consuming plant-based foods and high quality protein as part of nutritional weight management. Was counseled on pharmacotherapy and role as an adjunct in weight management.   Obesity Education Performed Today:  She was weighed on the bioimpedance scale and results were discussed and documented in the synopsis.  We discussed obesity as a disease and the importance of a more detailed evaluation of all the factors contributing to the disease.  We discussed the importance of long term lifestyle changes which include nutrition, exercise and behavioral modifications as well as the importance of customizing this to her specific health and social needs.  We discussed the benefits of reaching a healthier weight to alleviate the symptoms of existing conditions and reduce the risks of the biomechanical, metabolic and psychological effects of obesity.  Discussed New Patient/Late Arrival, and Cancellation Policies. Patient voiced understanding and allowed to ask questions.   Nicole Maddox appears to be in the action stage of change and states they are ready to start intensive lifestyle modifications and behavioral modifications.  30 minutes was spent today on this visit including the above counseling, pre-visit chart review, and post-visit  documentation.  Reviewed by clinician on day of visit: allergies, medications, problem list, medical history, surgical history, family history, social history, and previous encounter notes.    Earsie Humm A. Lorretta HarpO.

## 2023-08-09 ENCOUNTER — Other Ambulatory Visit: Payer: Self-pay

## 2023-08-15 ENCOUNTER — Telehealth: Payer: Self-pay

## 2023-08-15 NOTE — Telephone Encounter (Signed)
 Notified Patient of completion of requested Disability, FMLA, Accommodation, and Loan Deferment Forms. Fax transmission confirmations received. Copy of forms placed for pick-up as requested. No other needs or concerns noted at this time.

## 2023-08-22 ENCOUNTER — Ambulatory Visit (INDEPENDENT_AMBULATORY_CARE_PROVIDER_SITE_OTHER): Admitting: Bariatrics

## 2023-08-22 ENCOUNTER — Inpatient Hospital Stay (HOSPITAL_BASED_OUTPATIENT_CLINIC_OR_DEPARTMENT_OTHER): Payer: Managed Care, Other (non HMO) | Admitting: Hematology and Oncology

## 2023-08-22 ENCOUNTER — Encounter: Payer: Self-pay | Admitting: Bariatrics

## 2023-08-22 ENCOUNTER — Inpatient Hospital Stay: Payer: Managed Care, Other (non HMO) | Attending: Hematology and Oncology

## 2023-08-22 VITALS — BP 116/82 | HR 67 | Temp 97.8°F | Ht 64.0 in | Wt 187.0 lb

## 2023-08-22 VITALS — BP 119/66 | HR 60 | Temp 97.9°F | Resp 17 | Wt 192.0 lb

## 2023-08-22 DIAGNOSIS — Z171 Estrogen receptor negative status [ER-]: Secondary | ICD-10-CM | POA: Diagnosis not present

## 2023-08-22 DIAGNOSIS — Z800 Family history of malignant neoplasm of digestive organs: Secondary | ICD-10-CM | POA: Insufficient documentation

## 2023-08-22 DIAGNOSIS — Z1732 Human epidermal growth factor receptor 2 negative status: Secondary | ICD-10-CM | POA: Insufficient documentation

## 2023-08-22 DIAGNOSIS — R0602 Shortness of breath: Secondary | ICD-10-CM | POA: Diagnosis not present

## 2023-08-22 DIAGNOSIS — Z79899 Other long term (current) drug therapy: Secondary | ICD-10-CM | POA: Diagnosis not present

## 2023-08-22 DIAGNOSIS — Z79620 Long term (current) use of immunosuppressive biologic: Secondary | ICD-10-CM | POA: Diagnosis not present

## 2023-08-22 DIAGNOSIS — N644 Mastodynia: Secondary | ICD-10-CM | POA: Diagnosis not present

## 2023-08-22 DIAGNOSIS — E669 Obesity, unspecified: Secondary | ICD-10-CM | POA: Diagnosis not present

## 2023-08-22 DIAGNOSIS — Z1722 Progesterone receptor negative status: Secondary | ICD-10-CM | POA: Diagnosis not present

## 2023-08-22 DIAGNOSIS — Z808 Family history of malignant neoplasm of other organs or systems: Secondary | ICD-10-CM | POA: Diagnosis not present

## 2023-08-22 DIAGNOSIS — Z170 Estrogen receptor positive status [ER+]: Secondary | ICD-10-CM | POA: Diagnosis not present

## 2023-08-22 DIAGNOSIS — Z1331 Encounter for screening for depression: Secondary | ICD-10-CM

## 2023-08-22 DIAGNOSIS — C50412 Malignant neoplasm of upper-outer quadrant of left female breast: Secondary | ICD-10-CM | POA: Insufficient documentation

## 2023-08-22 DIAGNOSIS — R5383 Other fatigue: Secondary | ICD-10-CM | POA: Diagnosis not present

## 2023-08-22 DIAGNOSIS — Z6832 Body mass index (BMI) 32.0-32.9, adult: Secondary | ICD-10-CM

## 2023-08-22 DIAGNOSIS — E559 Vitamin D deficiency, unspecified: Secondary | ICD-10-CM | POA: Diagnosis not present

## 2023-08-22 DIAGNOSIS — Z0000 Encounter for general adult medical examination without abnormal findings: Secondary | ICD-10-CM

## 2023-08-22 DIAGNOSIS — E6609 Other obesity due to excess calories: Secondary | ICD-10-CM

## 2023-08-22 DIAGNOSIS — R739 Hyperglycemia, unspecified: Secondary | ICD-10-CM

## 2023-08-22 DIAGNOSIS — Z803 Family history of malignant neoplasm of breast: Secondary | ICD-10-CM | POA: Diagnosis not present

## 2023-08-22 DIAGNOSIS — R7309 Other abnormal glucose: Secondary | ICD-10-CM

## 2023-08-22 LAB — CBC WITH DIFFERENTIAL/PLATELET
Abs Immature Granulocytes: 0.03 10*3/uL (ref 0.00–0.07)
Basophils Absolute: 0.1 10*3/uL (ref 0.0–0.1)
Basophils Relative: 1 %
Eosinophils Absolute: 0.3 10*3/uL (ref 0.0–0.5)
Eosinophils Relative: 6 %
HCT: 28.5 % — ABNORMAL LOW (ref 36.0–46.0)
Hemoglobin: 9.5 g/dL — ABNORMAL LOW (ref 12.0–15.0)
Immature Granulocytes: 1 %
Lymphocytes Relative: 32 %
Lymphs Abs: 1.8 10*3/uL (ref 0.7–4.0)
MCH: 28 pg (ref 26.0–34.0)
MCHC: 33.3 g/dL (ref 30.0–36.0)
MCV: 84.1 fL (ref 80.0–100.0)
Monocytes Absolute: 0.5 10*3/uL (ref 0.1–1.0)
Monocytes Relative: 10 %
Neutro Abs: 2.9 10*3/uL (ref 1.7–7.7)
Neutrophils Relative %: 50 %
Platelets: 240 10*3/uL (ref 150–400)
RBC: 3.39 MIL/uL — ABNORMAL LOW (ref 3.87–5.11)
RDW: 28.2 % — ABNORMAL HIGH (ref 11.5–15.5)
WBC: 5.6 10*3/uL (ref 4.0–10.5)
nRBC: 0 % (ref 0.0–0.2)

## 2023-08-22 LAB — CMP (CANCER CENTER ONLY)
ALT: 16 U/L (ref 0–44)
AST: 19 U/L (ref 15–41)
Albumin: 4.2 g/dL (ref 3.5–5.0)
Alkaline Phosphatase: 59 U/L (ref 38–126)
Anion gap: 6 (ref 5–15)
BUN: 6 mg/dL (ref 6–20)
CO2: 26 mmol/L (ref 22–32)
Calcium: 8.9 mg/dL (ref 8.9–10.3)
Chloride: 105 mmol/L (ref 98–111)
Creatinine: 0.68 mg/dL (ref 0.44–1.00)
GFR, Estimated: >60 mL/min (ref >60)
Glucose, Bld: 86 mg/dL (ref 70–99)
Potassium: 3.4 mmol/L — ABNORMAL LOW (ref 3.5–5.1)
Sodium: 137 mmol/L (ref 135–145)
Total Bilirubin: 0.5 mg/dL (ref 0.0–1.2)
Total Protein: 6.6 g/dL (ref 6.5–8.1)

## 2023-08-22 LAB — TSH: TSH: 0.819 u[IU]/mL (ref 0.350–4.500)

## 2023-08-22 NOTE — Progress Notes (Signed)
 At a Glance:  Vitals Temp: 97.8 F (36.6 C) BP: 116/82 Pulse Rate: 67 SpO2: 100 %   Anthropometric Measurements Height: 5\' 4"  (1.626 m) Weight: 187 lb (84.8 kg) BMI (Calculated): 32.08 Starting Weight: 187lb   Body Composition  Body Fat %: 39.1 % Fat Mass (lbs): 73.4 lbs Muscle Mass (lbs): 108.6 lbs Total Body Water (lbs): 75.2 lbs Visceral Fat Rating : 8   Other Clinical Data RMR: 1613 Fasting: Yes Labs: Yes Today's Visit #: 1 Starting Date: 08/22/23    EKG: Normal sinus rhythm, rate 60.   Indirect Calorimeter:   Resting Metabolic Rate ( RMR):  RMR (actual): 1613 kcal RMR (calculated): 1584 kcal The calculated basal metabolic rate is 3086 kcal thus her basal metabolic rate is slightly better than expected.  Plan:   Indirect calorimeter completed, interpreted and reviewed with patient today and allowed to ask questions.  Discussed the implications for the chosen plan and exercise based on the RMR reading.  Will consider repeating the RMR in the future based on weight loss.    Chief Complaint:  Obesity   Subjective:  Nicole Maddox (MR# [id number]) is a 43 y.o. female who presents for evaluation and treatment of obesity and related comorbidities.   Nicole Maddox is currently in the action stage of change and ready to dedicate time achieving and maintaining a healthier weight. Nicole Maddox is interested in becoming our patient and working on intensive lifestyle modifications including (but not limited to) diet and exercise for weight loss.  Nicole Maddox has been struggling with her weight. She has been unsuccessful in either losing weight, maintaining weight loss, or reaching her healthy weight goal.  Nicole Maddox's habits were reviewed today and are as follows: Her family eats meals together, she thinks her family will eat healthier with her, she started gaining weight in her late twenties and thirties, she snacks frequently in the evenings, she skips meals  frequently, and she struggles with emotional eating.  Current or previous pharmacotherapy: GLP-1  Response to medication: Had side effects so it was discontinued  Other Fatigue Nicole Maddox admits to daytime somnolence and admits to waking up still tired. Nicole Maddox generally gets 5 or 6 hours of sleep per night, and states that she has difficulty falling back asleep if awakened. Snoring is not present. Apneic episodes is not present. Epworth Sleepiness Score is 5.   Shortness of Breath Nicole Maddox notes increasing shortness of breath with exercising and seems to be worsening over time with weight gain. She notes getting out of breath sooner with activity than she used to. This has gotten worse recently. Nicole Maddox denies shortness of breath at rest or orthopnea.  Depression Screen Nicole Maddox's Food and Mood (modified PHQ-9) score was 3. <5 no depression     06/28/2022    3:50 PM  Depression screen PHQ 2/9  Decreased Interest 0  Down, Depressed, Hopeless 0  PHQ - 2 Score 0     Assessment and Plan:   Other Fatigue Nicole Maddox does feel that her weight is causing her energy to be lower than it should be. Fatigue may be related to obesity, depression or many other causes. Labs will be ordered, and in the meanwhile, Nicole Maddox will focus on self care including making healthy food choices, increasing physical activity and focusing on stress reduction.  Shortness of Breath Nicole Maddox does feel that she gets out of breath more easily that she used to when she exercises. Nicole Maddox's shortness of breath appears to be obesity related and exercise induced. She has  agreed to work on weight loss and gradually increase exercise to treat her exercise induced shortness of breath. Will continue to monitor closely.  Health Maintenance:   Obesity   Plan: Will do EKG, indirect calorimetry, and labs.     Vitamin D Deficiency Her vitamin D was low in the past.  She is at risk for vitamin D deficiency due to obesity.   She is not on vitamin D at this time.  Plan: Will check for vitamin D deficiency.   Elevated glucose:  She denies a history of prediabetes and/or diabetes.  Plan: Will check a hemoglobin A1c and an insulin level.   Previous labs reviewed today. Date: 08/01/2023 CMP and CBC, TSH  Labs done today Lipids, Insulin, HgbA1c, and Vit D   Generalized Obesity: BMI (Calculated): 32.08   Nicole Maddox is currently in the action stage of change and her goal is to begin weight loss efforts. I recommend Nicole Maddox begin the structured treatment plan as follows:  She has agreed to Category 2 Plan  Exercise goals: All adults should avoid inactivity. Some activity is better than none, and adults who participate in any amount of physical activity, gain some health benefits.  Behavioral modification strategies:increasing lean protein intake, increasing vegetables, decrease liquid calories, increase high fiber foods, meal planning and cooking strategies, keeping healthy foods in the home, better snacking choices, and avoiding temptations  She was informed of the importance of frequent follow-up visits to maximize her success with intensive lifestyle modifications for her multiple health conditions. She was informed we would discuss her lab results at her next visit unless there is a critical issue that needs to be addressed sooner. Nicole Maddox agreed to keep her next visit at the agreed upon time to discuss these results.  Objective:  General: Cooperative, alert, well developed, in no acute distress. HEENT: Conjunctivae and lids unremarkable. Cardiovascular: Regular rhythm.  Lungs: Normal work of breathing. Neurologic: No focal deficits.   Lab Results  Component Value Date   CREATININE 0.75 08/01/2023   BUN 10 08/01/2023   NA 138 08/01/2023   K 3.1 (L) 08/01/2023   CL 107 08/01/2023   CO2 26 08/01/2023   Lab Results  Component Value Date   ALT 23 08/01/2023   AST 25 08/01/2023   ALKPHOS 63  08/01/2023   BILITOT 0.5 08/01/2023   No results found for: "HGBA1C" No results found for: "INSULIN" Lab Results  Component Value Date   TSH 0.368 08/01/2023   No results found for: "CHOL", "HDL", "LDLCALC", "LDLDIRECT", "TRIG", "CHOLHDL" Lab Results  Component Value Date   WBC 5.7 08/01/2023   HGB 9.9 (L) 08/01/2023   HCT 29.5 (L) 08/01/2023   MCV 83.1 08/01/2023   PLT 267 08/01/2023   No results found for: "IRON", "TIBC", "FERRITIN"  Attestation Statements:  Applicable history such as the following:  allergies, medications, problem list, medical history, surgical history, family history, social history, and previous encounter notes reviewed by clinician on day of visit:  Time spent on visit including the items listed below was 40 minutes.  -preparing to see the patient (e.g., review of tests, history, previous notes) -obtaining and/or reviewing separately obtained history -counseling and educating the patient/family/caregiver -documenting clinical information in the electronic or other health record -ordering medications, tests, or procedures -independently interpreting results and communicating results to the patient/ family/caregiver -referring and communicating with other health care professionals  -care coordination   This may have been prepared with the assistance of Engineer, civil (consulting).  Occasional  wrong-word or sound-a-like substitutions may have occurred due to the inherent limitations of voice recognition software.    Corinna Capra, DO

## 2023-08-22 NOTE — Progress Notes (Signed)
 BRIEF ONCOLOGIC HISTORY:   Oncology History  Malignant neoplasm of upper-outer quadrant of left female breast (HCC)  06/23/2022 Initial Diagnosis   Left breast needle core biopsy at 1:00, 10 cmfn: IDC< grade 3, 1.1 cm, ER 30% positive, weak staining intensity, PR negative, Ki-67 95%, HER2 negative.  Axillary node negative.    06/28/2022 Cancer Staging   Staging form: Breast, AJCC 8th Edition - Clinical stage from 06/28/2022: Stage IB (cT1c, cN0(f), cM0, G3, ER+, PR-, HER2-) - Signed by Ronny Bacon, PA-C on 06/28/2022 Stage prefix: Initial diagnosis Method of lymph node assessment: Core biopsy Histologic grading system: 3 grade system   07/11/2022 Genetic Testing   Negative genetic testing on the 9 gene STAT panel.  The report date is July 05, 2022. Negative genetic testing on the Multi-cancer panel.  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  BLM c.3136G>A (p.Gly1046Ser), EGFR c.61G>A (p.Ala21Thr) and PDGFRA c.50G>T (p.Gly17Val) VUS identified.  The report date is July 11, 2022.  The STAT Breast cancer panel offered by [facility] includes sequencing and rearrangement analysis for the following 9 genes:  ATM, BRCA1, BRCA2, CDH1, CHEK2, PALB2, PTEN, STK11 and TP53.   The Multi-Cancer + RNA Panel offered by [facility] includes sequencing and/or deletion/duplication analysis of the following 70 genes:  AIP*, ALK, APC*, ATM*, AXIN2*, BAP1*, BARD1*, BLM*, BMPR1A*, BRCA1*, BRCA2*, BRIP1*, CDC73*, CDH1*, CDK4, CDKN1B*, CDKN2A, CHEK2*, CTNNA1*, DICER1*, EPCAM (del/dup only), EGFR, FH*, FLCN*, GREM1 (promoter dup only), HOXB13, KIT, LZTR1, MAX*, MBD4, MEN1*, MET, MITF, MLH1*, MSH2*, MSH3*, MSH6*, MUTYH*, NF1*, NF2*, NTHL1*, PALB2*, PDGFRA, PMS2*, POLD1*, POLE*, POT1*, PRKAR1A*, PTCH1*, PTEN*, RAD51C*, RAD51D*, RB1*, RET, SDHA* (sequencing only), SDHAF2*, SDHB*, SDHC*, SDHD*, SMAD4*, SMARCA4*, SMARCB1*, SMARCE1*, STK11*, SUFU*, TMEM127*, TP53*, TSC1*, TSC2*, VHL*. RNA analysis is performed for * genes.    07/20/2022 -  11/30/2022 Chemotherapy   Patient is on Treatment Plan : BREAST ADJUVANT DOSE DENSE AC q14d / PACLitaxel q7d     07/21/2022 Procedure   Right breast needle core biopsy upper outer breast x 2: Negative for malignancy, left breast needle core biopsy lower: Negative for malignancy.   08/07/2022 Procedure   2 additional right breast needle core biopsies both negative for malignancy   01/04/2023 Surgery   Left breast lumpectomy: Invasive ductal carcinoma, 1.6 cm, grade 3, margins negative, 6 lymph nodes negative for malignancy.  Repeat prognostic panel: Triple negative breast cancer with a Ki67 of 90%.  Right breast lumpectomy: Negative for carcinoma.  Patient also underwent bilateral mammoplasties and path was negative for carcinoma on both specimen submitted.    - 03/29/2023 Radiation Therapy   Left breast: 40.05 Gy in 15 treatments "Boost": 10 cGy in 5 treatments    04/17/2023 -  Chemotherapy   Patient is on Treatment Plan : BREAST Capecitabine q21d       INTERVAL HISTORY:  Nicole Maddox is here for follow up.  Discussed the use of AI scribe software for clinical note transcription with the patient, who gave verbal consent to proceed.  History of Present Illness    Nicole Maddox is a 43 year old female with triple negative breast cancer who presents with headaches and hand swelling.  She is currently on cycle five of her Xeloda regimen, taking three pills in the morning and three in the evening for three weeks, followed by a week off, with a plan to complete eight cycles. She experiences side effects from the medication, including headaches, hand swelling, skin changes, and symptoms of temporary menopause.  She experienced intense headaches on Monday,  Tuesday, and Thursday of last week, which were not classified as migraines. Lying down and taking Tylenol provided some relief, and she is currently not experiencing headaches. No cold or other symptoms accompanied the headaches.  She notes  intermittent swelling in her hands and fingers, particularly during physical activity such as walking. The swelling resolves spontaneously, as observed when it occurred during a walk but returned to normal the next day. She is currently on a week off from her Xeloda treatment.  She reports skin changes, including dry patches on her face and darkening of her feet. She attributes some facial skin changes to a recent switch in facial cream. Her skin is less itchy following a dose reduction of her medication.  She describes experiencing temporary menopause symptoms, including infrequent menstruation, vaginal dryness, and decreased sex drive. Her last menstrual cycle was in February, and she is concerned about these symptoms and their duration.  Rest of the pertinent 10 point ROS reviewed and neg.  PAST MEDICAL/SURGICAL HISTORY:  Past Medical History:  Diagnosis Date   Breast cancer (HCC) 06/23/2022   Family history of adverse reaction to anesthesia    mother with h/o slow to wake   Family history of stomach cancer    GERD (gastroesophageal reflux disease)    Hyperthyroidism    due to Graves' disease   Multinodular thyroid    Personal history of chemotherapy    Personal history of radiation therapy    Thyroid disease    Past Surgical History:  Procedure Laterality Date   BREAST BIOPSY Left 06/23/2022   [place] LT BREAST BX W LOC DEV 1ST LESION IMG BX SPEC US GUIDE 06/23/2022 [facility]   BREAST BIOPSY Left 01/02/2023   [place] LT RADIOACTIVE SEED LOC 01/02/2023 [facility]   BREAST BIOPSY  01/02/2023   MM RT RADIOACTIVE SEED LOC MAMMO GUIDE 01/02/2023 [facility]   BREAST LUMPECTOMY Left 01/04/2023   BREAST LUMPECTOMY WITH RADIOACTIVE SEED AND SENTINEL LYMPH NODE BIOPSY Left 01/04/2023   Procedure: LEFT BREAST LUMPECTOMY WITH RADIOACTIVE SEED AND SENTINEL LYMPH NODE BIOPSY;  Surgeon: Griselda Miner, MD;  Location: [facility] OR;  Service: General;  Laterality: Left;  PEC BLOCK   BREAST  LUMPECTOMY WITH RADIOACTIVE SEED LOCALIZATION Right 01/04/2023   Procedure: RIGHT BREAST LUMPECTOMY WITH RADIOACTIVE SEED LOCALIZATION;  Surgeon: Griselda Miner, MD;  Location[facility];  Service: General;  Laterality: Right;   BREAST REDUCTION WITH MASTOPEXY Bilateral 01/04/2023   Procedure: BREAST REDUCTION WITH MASTOPEXY;  Surgeon: Peggye Form, DO;  Location: [facility] OR;  Service: Plastics;  Laterality: Bilateral;   PORT-A-CATH REMOVAL N/A 03/01/2023   Procedure: REMOVAL PORT-A-CATH;  Surgeon: Griselda Miner, MD;  Location: [facility];  Service: General;  Laterality: N/A;   PORTACATH PLACEMENT Right 07/19/2022   Procedure: INSERTION PORT-A-CATH;  Surgeon: Griselda Miner, MD;  Location: [facility];  Service: General;  Laterality: Right;  60 MIN ROOM 8   WISDOM TOOTH EXTRACTION       ALLERGIES:  Allergies  Allergen Reactions   Adriamycin [Doxorubicin] Other (See Comments)    Patient c/o of lower back pain and right arm pain. See progress note from 07/20/22     CURRENT MEDICATIONS:  Outpatient Encounter Medications as of 08/22/2023  Medication Sig   capecitabine (XELODA) 500 MG tablet Take 4 tablets (2,000 mg total) by mouth 2 (two) times daily after a meal. Take on days 1-14. Repeat every 21 days.   COD LIVER OIL PO Take  by mouth daily.   Cyanocobalamin (VITAMIN B-12 PO) Take by mouth daily.   DENTA 5000 PLUS 1.1 % CREA dental cream Place 1 Application onto teeth daily.   diclofenac Sodium (VOLTAREN) 1 % GEL Research Patient: Apply 0.5 grams (1 fingertip) to each hand and each foot twice daily for up to 12 weeks   folic acid (FOLVITE) 1 MG tablet Take 1 mg by mouth daily.   loratadine (CLARITIN) 10 MG tablet Take 10 mg by mouth daily.   metFORMIN (GLUCOPHAGE) 500 MG tablet    methimazole (TAPAZOLE) 5 MG tablet Take 15 mg by mouth 2 (two) times daily.   omeprazole (PRILOSEC) 40 MG capsule Take 40 mg by mouth daily.   No facility-administered encounter  medications on file as of 08/22/2023.     ONCOLOGIC FAMILY HISTORY:  Family History  Problem Relation Age of Onset   Sleep apnea Mother    Obesity Mother    Heart attack Father 76   Heart Problems Maternal Grandmother    Stomach cancer Maternal Aunt        dx > 50   Throat cancer Maternal Uncle        dx 18s   Stomach cancer Maternal Uncle        dx > 50   Breast cancer Cousin        mother's maternal first cousin     SOCIAL HISTORY:  Social History   Socioeconomic History   Marital status: Single    Spouse name: Not on file   Number of children: Not on file   Years of education: Not on file   Highest education level: Not on file  Occupational History   Not on file  Tobacco Use   Smoking status: Never   Smokeless tobacco: Never  Vaping Use   Vaping status: Never Used  Substance and Sexual Activity   Alcohol use: Yes    Comment: socially   Drug use: Never   Sexual activity: Not Currently    Partners: Male    Birth control/protection: Condom    Comment: intercourse age 15, more than 6 sexual parters,   Other Topics Concern   Not on file  Social History Narrative   Not on file   Social Drivers of Health   Financial Resource Strain: Low Risk  (07/04/2022)   Overall Financial Resource Strain (CARDIA)    Difficulty of Paying Living Expenses: Not very hard  Food Insecurity: No Food Insecurity (06/28/2022)   Hunger Vital Sign    Worried About Running Out of Food in the Last Year: Never true    Ran Out of Food in the Last Year: Never true  Transportation Needs: No Transportation Needs (06/28/2022)   PRAPARE - Administrator, Civil Service (Medical): No    Lack of Transportation (Non-Medical): No  Physical Activity: Not on file  Stress: Not on file  Social Connections: Unknown (01/09/2023)   Received from [facility]   Social Connections    Frequency of Communication with Friends and Family: Not asked    Frequency of Social Gatherings with Friends  and Family: Not asked  Intimate Partner Violence: Unknown (01/09/2023)   Received from [facility]   Intimate Partner Violence    Fear of Current or Ex-Partner: Not asked    Emotionally Abused: Not asked    Physically Abused: Not asked    Sexually Abused: Not asked    OBSERVATIONS/OBJECTIVE:  Physical Exam Constitutional:      Appearance: Normal  appearance.  Cardiovascular:     Rate and Rhythm: Normal rate and regular rhythm.     Pulses: Normal pulses.     Heart sounds: Normal heart sounds.  Pulmonary:     Effort: Pulmonary effort is normal.     Breath sounds: Normal breath sounds.  Musculoskeletal:        General: No swelling. Normal range of motion.     Cervical back: Normal range of motion.  Lymphadenopathy:     Cervical: No cervical adenopathy.  Skin:    General: Skin is warm and dry.  Neurological:     General: No focal deficit present.     Mental Status: She is alert.    Bilaterals breasts inspected. No palpable masses or regional adenopathy.  LABORATORY DATA:  None for this visit.  DIAGNOSTIC IMAGING:  None for this visit.   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  She received neoadjuvant AC-T.  She tolerated chemotherapy very well.  Final surgery showed residual IDC with focal sarcomatoid changes measuring 1.6 cm grade 3, high-grade DCIS, negative margins, no lymph node involvement no evidence of lymphovascular or perineural invasion in the primary tumor.   Triple negative breast cancer On maintenance chemotherapy with Xeloda, cycle 5 completed. Experiencing side effects including skin changes, hand swelling, and temporary menopause symptoms. - Continue Xeloda treatment with 3 tablets in the morning and 3 in the  evening. - Complete 8 cycles of Xeloda treatment.  Hand swelling Intermittent swelling of hands and fingers related to Xeloda treatment. Swelling resolves by the next day.  Skin changes Dry patches on face and darkening of feet. Some improvement in itchiness with dose reduction of Xeloda. Recent change in facial cream may contribute to facial skin changes.  Temporary menopause Symptoms include infrequent menstruation, vaginal dryness, and decreased libido. Symptoms expected to resolve 1-2 years after completion of Xeloda treatment.  Psychological distress Experiencing anxiety and emotional distress, possibly related to cancer treatment and temporary menopause. No psychologist available at the current facility. - Check insurance for coverage of psychological services and seek counseling.  Breast pain Intermittent breast pain, previously in the right breast, now in the left. Possibly related to hormonal changes or nerve regeneration. No new lumps detected, only scar tissue present.  Time spent: 30 min *Total Encounter Time as defined by the Centers for Medicare and Medicaid Services includes, in addition to the face-to-face time of a patient visit (documented in the note above) non-face-to-face time: obtaining and reviewing outside history, ordering and reviewing medications, tests or procedures, care coordination (communications with other health care professionals or caregivers) and documentation in the medical record.

## 2023-08-23 ENCOUNTER — Encounter: Payer: Self-pay | Admitting: Bariatrics

## 2023-08-23 DIAGNOSIS — E7800 Pure hypercholesterolemia, unspecified: Secondary | ICD-10-CM | POA: Insufficient documentation

## 2023-08-23 LAB — INSULIN, RANDOM: INSULIN: 14 u[IU]/mL (ref 2.6–24.9)

## 2023-08-23 LAB — LIPID PANEL WITH LDL/HDL RATIO
Cholesterol, Total: 234 mg/dL — ABNORMAL HIGH (ref 100–199)
HDL: 103 mg/dL (ref >39)
LDL Chol Calc (NIH): 115 mg/dL — ABNORMAL HIGH (ref 0–99)
LDL/HDL Ratio: 1.1 ratio (ref 0.0–3.2)
Triglycerides: 92 mg/dL (ref 0–149)
VLDL Cholesterol Cal: 16 mg/dL (ref 5–40)

## 2023-08-23 LAB — VITAMIN D 25 HYDROXY (VIT D DEFICIENCY, FRACTURES): Vit D, 25-Hydroxy: 20.3 ng/mL — ABNORMAL LOW (ref 30.0–100.0)

## 2023-08-23 LAB — HEMOGLOBIN A1C
Est. average glucose Bld gHb Est-mCnc: 105 mg/dL
Hgb A1c MFr Bld: 5.3 % (ref 4.8–5.6)

## 2023-08-28 ENCOUNTER — Encounter: Payer: Self-pay | Admitting: Hematology and Oncology

## 2023-08-28 ENCOUNTER — Telehealth: Payer: Self-pay | Admitting: *Deleted

## 2023-08-28 DIAGNOSIS — I890 Lymphedema, not elsewhere classified: Secondary | ICD-10-CM

## 2023-08-28 DIAGNOSIS — Z170 Estrogen receptor positive status [ER+]: Secondary | ICD-10-CM

## 2023-08-28 MED ORDER — DOXYCYCLINE HYCLATE 100 MG PO TABS: 100.0000 mg | ORAL_TABLET | Freq: Two times a day (BID) | ORAL | 0 refills | Status: DC

## 2023-08-28 NOTE — Therapy (Incomplete)
 OUTPATIENT PHYSICAL THERAPY  UPPER EXTREMITY ONCOLOGY EVALUATION  Patient Name: Nicole Maddox MRN: [id number] DOB:08-08-1980, 43 y.o., female Today's Date: 08/29/2023  END OF SESSION:  PT End of Session - 08/29/23 1205     Visit Number 1    Number of Visits 12    Date for PT Re-Evaluation 10/10/23    Authorization Type none required but max 26/  used 5    Authorization - Visit Number 1    Authorization - Number of Visits 21    PT Start Time 1105    PT Stop Time 1156    PT Time Calculation (min) 51 min    Activity Tolerance Patient tolerated treatment well    Behavior During Therapy WFL for tasks assessed/performed             Past Medical History:  Diagnosis Date   Breast cancer (HCC) 06/23/2022   Family history of adverse reaction to anesthesia    mother with h/o slow to wake   Family history of stomach cancer    GERD (gastroesophageal reflux disease)    Hyperthyroidism    due to Graves' disease   Multinodular thyroid    Personal history of chemotherapy    Personal history of radiation therapy    Thyroid disease    Past Surgical History:  Procedure Laterality Date   BREAST BIOPSY Left 06/23/2022   [place] LT BREAST BX W LOC DEV 1ST LESION IMG BX SPEC US GUIDE 06/23/2022 [facility]   BREAST BIOPSY Left 01/02/2023   [place] LT RADIOACTIVE SEED LOC 01/02/2023 [facility]   BREAST BIOPSY  01/02/2023   MM RT RADIOACTIVE SEED LOC MAMMO GUIDE 01/02/2023 [facility]   BREAST LUMPECTOMY Left 01/04/2023   BREAST LUMPECTOMY WITH RADIOACTIVE SEED AND SENTINEL LYMPH NODE BIOPSY Left 01/04/2023   Procedure: LEFT BREAST LUMPECTOMY WITH RADIOACTIVE SEED AND SENTINEL LYMPH NODE BIOPSY;  Surgeon: Griselda Miner, MD;  Location: [facility] OR;  Service: General;  Laterality: Left;  PEC BLOCK   BREAST LUMPECTOMY WITH RADIOACTIVE SEED LOCALIZATION Right 01/04/2023   Procedure: RIGHT BREAST LUMPECTOMY WITH RADIOACTIVE SEED LOCALIZATION;  Surgeon: Griselda Miner, MD;  Location: [facility]  OR;  Service: General;  Laterality: Right;   BREAST REDUCTION WITH MASTOPEXY Bilateral 01/04/2023   Procedure: BREAST REDUCTION WITH MASTOPEXY;  Surgeon: Peggye Form, DO;  Location: [facility] OR;  Service: Plastics;  Laterality: Bilateral;   PORT-A-CATH REMOVAL N/A 03/01/2023   Procedure: REMOVAL PORT-A-CATH;  Surgeon: Griselda Miner, MD;  Location: [facility];  Service: General;  Laterality: N/A;   PORTACATH PLACEMENT Right 07/19/2022   Procedure: INSERTION PORT-A-CATH;  Surgeon: Griselda Miner, MD;  Location: [facility];  Service: General;  Laterality: Right;  60 MIN ROOM 8   WISDOM TOOTH EXTRACTION     Patient Active Problem List   Diagnosis Date Noted   Elevated cholesterol 08/23/2023   Vitamin D deficiency 08/08/2023   Port-A-Cath in place 07/20/2022   Genetic testing 07/07/2022   Family history of stomach cancer 06/29/2022   Malignant neoplasm of upper-outer quadrant of left female breast (HCC) 06/27/2022   Seborrheic dermatitis of scalp 02/25/2018    PCP:   REFERRING PROVIDER: Rachel Moulds, MD  REFERRING DIAG: Left UE swelling/tenderness s/p breast Cancer  THERAPY DIAG:  Aftercare following surgery for neoplasm - Plan: PT plan of care cert/re-cert  Abnormal posture - Plan: PT plan of care cert/re-cert  Malignant neoplasm of upper-outer quadrant of left female breast, unspecified estrogen receptor  status (HCC) - Plan: PT plan of care cert/re-cert  Axillary web syndrome - Plan: PT plan of care cert/re-cert  At risk for lymphedema  ONSET DATE: 08/24/2023  Rationale for Evaluation and Treatment: Rehabilitation  SUBJECTIVE:                                                                                                                                                                                           SUBJECTIVE STATEMENT:  My left arm is sore to touch from the upper arm to the forearm and I feel like it is swollen. I tend to hold  my arm across the waist because it feels better. I get occasional shooting pains in the breast that is not new. They called in an antibiotic for me but I have not picked it up yet.  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 Completed chemotherapy. On 01/04/2023 pt had a Lt lumpectomy with 6 negative nodes. Completed radiation 03/29/23. On Capecitabine (Xeloda) chemo. Pt notices swelling in bilateral hands with walking which resolves  PAIN:  Are you having pain? Yes NPRS scale: 2/10- 5/10 Pain location: left arm Pain orientation: Left  PAIN TYPE: aching, tight, and constant Pain description: constant  Aggravating factors: movement, light touch Relieving factors: not moving  PRECAUTIONS: Lt lymphedema risk   RED FLAGS: None   WEIGHT BEARING RESTRICTIONS: No  FALLS:  Has patient fallen in last 6 months? No  LIVING ENVIRONMENT: Lives with: lives alone, sometimes goes to see her mom in [place], [place] Lives in: House/apartment    OCCUPATION: On leave  LEISURE: reading,  walking  HAND DOMINANCE: right   PRIOR LEVEL OF FUNCTION: Independent  PATIENT GOALS: get pain better, check swelling   OBJECTIVE: Note: Objective measures were completed at Evaluation unless otherwise noted.  COGNITION: Overall cognitive status: Within functional limits for tasks assessed   PALPATION: Tender throughout left forearm and upper arm, multiple cords palpable but not visible  OBSERVATIONS / OTHER ASSESSMENTS: pt very guarded/anxious and wants to hold arm across waist  SENSATION: Light touch: Deficits      POSTURE: forward head, rounded shoulders  UPPER EXTREMITY AROM/PROM:  A/PROM RIGHT   eval   Shoulder extension 51  Shoulder flexion 147  Shoulder abduction 175  Shoulder internal rotation   Shoulder external rotation     (Blank rows = not tested)  A/PROM LEFT   eval  Shoulder extension 33  Shoulder flexion 122  Shoulder abduction 90  Shoulder internal rotation    Shoulder external rotation     (Blank rows = not tested)  CERVICAL AROM: All within normal limits:  UPPER EXTREMITY STRENGTH:   LYMPHEDEMA ASSESSMENTS:   SURGERY TYPE/DATE: 01/04/2023 Left lumpectomy  NUMBER OF LYMPH NODES REMOVED: 0/6  CHEMOTHERAPY: YES  RADIATION:YES  HORMONE TREATMENT: NO  INFECTIONS: NO   LYMPHEDEMA ASSESSMENTS:   LANDMARK RIGHT  eval  At axilla  33.6  15 cm proximal to olecranon process   10 cm proximal to olecranon process 31.9  Olecranon process 27.7  15 cm proximal to ulnar styloid process   10 cm proximal to ulnar styloid process 24.05  Just proximal to ulnar styloid process 15.5  Across hand at thumb web space 19.35  At base of 2nd digit 5.65  (Blank rows = not tested)  LANDMARK LEFT  eval  At axilla  32.9  15 cm proximal to olecranon process   10 cm proximal to olecranon process 32.3  Olecranon process 27.4  15 cm proximal to ulnar styloid process   10 cm proximal to ulnar styloid process 23.7  Just proximal to ulnar styloid process 15.2  Across hand at thumb web space 18.8  At base of 2nd digit 5.7  (Blank rows = not tested)   FUNCTIONAL TESTS:    GAIT:WNL   L-DEX FLOWSHEETS - 08/29/23 1100       L-DEX LYMPHEDEMA SCREENING   Measurement Type Unilateral    L-DEX MEASUREMENT EXTREMITY Upper Extremity    POSITION  Standing    DOMINANT SIDE Right    At Risk Side Left    BASELINE SCORE (UNILATERAL) 0.4    L-DEX SCORE (UNILATERAL) 4.3    VALUE CHANGE (UNILAT) 3.9              QUICK DASH SURVEY: 39%                                                                                                                            TREATMENT DATE:  08/29/2023 Printed axillary web info for pt.. Instructed in and had pt perform 4 post op exercises x 5 reps, and will do 2x's per day. Also educated in wrist extension with arm in various positions where she feels tightness to loosen cords. Educated in POC, LOS, and treatment  interventions to decrease pain/cording. Measured circumference and performed SOZO to alleviate fear of lymphedema. Also advised she does not require antibiotics for this    PATIENT EDUCATION:  Education details: educated in cording and the need to gently stretch to improve, avoid guarding, 4 post op exercises to start to improve ROM, no lymphedema or infection Person educated: Patient Education method: Programmer, multimedia, Demonstration, and Handouts Education comprehension: verbalized understanding and returned demonstration  HOME EXERCISE PROGRAM: 4 post op exercises, wrist ext with arm in various positions to feel stretch  ASSESSMENT:  CLINICAL IMPRESSION: Patient is a 43 y.o. female who was seen today for physical therapy evaluation and treatment for Left arm pain and concerns of swelling. She presents with pain,tenderness and  limitations in left shoulder ROM, with multiple palpable cords in the Left  upper arm, axilla and forearm. Her Sozo screen was in the green and circumferential measurements reveal no cause for concerns of lymphedema. Pt does experience bilateral hand swelling at times, most likely related to Xeloda since it is bilateral.  Also advised pt there is no sign of infection so no need to take antibiotics. She will benefit from skilled PT to address deficits and return to PLOF.   OBJECTIVE IMPAIRMENTS: decreased activity tolerance, decreased knowledge of condition, decreased ROM, increased fascial restrictions, impaired UE functional use, postural dysfunction, and pain.   ACTIVITY LIMITATIONS: lifting and reach over head  PARTICIPATION LIMITATIONS: cleaning and yard work  PERSONAL FACTORS: 3+ comorbidities: Left  triple negative breast cancer s/p chemo and radiation  are also affecting patient's functional outcome.   REHAB POTENTIAL: Good  CLINICAL DECISION MAKING: Evolving/moderate complexity  EVALUATION COMPLEXITY: Moderate  GOALS: Goals reviewed with patient?  Yes  SHORT TERM GOALS: Target date: 09/19/2023  Pt will be independent in HEP for stretching and strengthening of left UE Baseline: Goal status: INITIAL  2.  Pt will report pain/tenderness from cording improved by 25% Baseline:  Goal status: INITIAL  3.  Left shoulder ROM will be improved by 20 degrees fro shoulder flexion and 40 degrees for Abd for improved reaching Baseline: 122 flex, 90 abd Goal status: INITIAL    LONG TERM GOALS: Target date: 10/10/2023  Pain will be improved by atleast 50% for improved function Baseline:  Goal status: INITIAL  2.  Left shoulder ROM will be within 10 degrees of Right shoulder ROM for improved reaching Baseline:  Goal status: INITIAL  3.  Quick dash will be no greater than 15% to demonstrate improved function in Left UE Baseline:  Goal status: INITIAL  PLAN:  PT FREQUENCY: 2x/week  PT DURATION: 6 weeks  PLANNED INTERVENTIONS: 97164- PT Re-evaluation, 97110-Therapeutic exercises, 97530- Therapeutic activity, 97112- Neuromuscular re-education, 97535- Self Care, 16109- Manual therapy, and 97760- Orthotic Fit/training, Dry Needling  PLAN FOR NEXT SESSION: Review HEP, pulleys, wand, MFR techniques to cording, cupping/IASTM prn for cording, progress to strength when ready  Waynette Buttery, PT 08/29/2023, 12:44 PM

## 2023-08-28 NOTE — Telephone Encounter (Signed)
 See My Chart message per onset of left arm swelling - with tenderness - no warmth or focal pain - no fevers.  Pt is currently in [facility] staying at her mother's - antibiotic sent to local pharmacy and referral placed for lymphedema clinic

## 2023-08-29 ENCOUNTER — Other Ambulatory Visit: Payer: Self-pay

## 2023-08-29 ENCOUNTER — Ambulatory Visit: Attending: Hematology and Oncology

## 2023-08-29 DIAGNOSIS — L7682 Other postprocedural complications of skin and subcutaneous tissue: Secondary | ICD-10-CM

## 2023-08-29 DIAGNOSIS — C50412 Malignant neoplasm of upper-outer quadrant of left female breast: Secondary | ICD-10-CM

## 2023-08-29 DIAGNOSIS — R600 Localized edema: Secondary | ICD-10-CM | POA: Diagnosis present

## 2023-08-29 DIAGNOSIS — Z170 Estrogen receptor positive status [ER+]: Secondary | ICD-10-CM | POA: Diagnosis not present

## 2023-08-29 DIAGNOSIS — L905 Scar conditions and fibrosis of skin: Secondary | ICD-10-CM | POA: Insufficient documentation

## 2023-08-29 DIAGNOSIS — R293 Abnormal posture: Secondary | ICD-10-CM | POA: Diagnosis present

## 2023-08-29 DIAGNOSIS — Z483 Aftercare following surgery for neoplasm: Secondary | ICD-10-CM | POA: Diagnosis present

## 2023-08-29 DIAGNOSIS — Z9189 Other specified personal risk factors, not elsewhere classified: Secondary | ICD-10-CM | POA: Diagnosis present

## 2023-08-29 DIAGNOSIS — I890 Lymphedema, not elsewhere classified: Secondary | ICD-10-CM | POA: Insufficient documentation

## 2023-08-29 NOTE — Patient Instructions (Signed)
 Axillary web syndrome (also called cording) can happen after having breast cancer surgery when lymph nodes in the armpit are removed. It presents as if you have a thin cord in your arm and can run from the armpit all the way down into the forearm. If you've had a sentinel node biopsy, the risk is 1-20% and if you've had an axillary lymph node dissection (more than 7 nodes removed), the risk is 36-72%. The ranges vary depending on the research study.  It most often happens 3-4 weeks post-op but can happen sooner or later. There are several possibilities for what cording actually is. Although no one knows for sure as of yet, it may be related to lymphatics, veins, or other tissue. Sometimes cording resolves on its own but other times it requires physical therapy with a therapist who specializes in lymphedema and/or cancer rehab. Treatment typically involves stretching, manual techniques, and exercise. Sometimes cords get "released" while stretching or during manual treatment and the patient may experience the sensation of a "pop." This may feel strange but it is not dangerous and is a sign that the cord has released; range of motion may be improved in the process.

## 2023-08-30 ENCOUNTER — Other Ambulatory Visit: Payer: Self-pay

## 2023-09-03 ENCOUNTER — Ambulatory Visit (INDEPENDENT_AMBULATORY_CARE_PROVIDER_SITE_OTHER): Admitting: Rehabilitation

## 2023-09-03 ENCOUNTER — Encounter: Payer: Self-pay | Admitting: Rehabilitation

## 2023-09-03 DIAGNOSIS — R600 Localized edema: Secondary | ICD-10-CM

## 2023-09-03 DIAGNOSIS — Z9189 Other specified personal risk factors, not elsewhere classified: Secondary | ICD-10-CM

## 2023-09-03 DIAGNOSIS — C50412 Malignant neoplasm of upper-outer quadrant of left female breast: Secondary | ICD-10-CM

## 2023-09-03 DIAGNOSIS — Z483 Aftercare following surgery for neoplasm: Secondary | ICD-10-CM

## 2023-09-03 DIAGNOSIS — R293 Abnormal posture: Secondary | ICD-10-CM

## 2023-09-03 DIAGNOSIS — L7682 Other postprocedural complications of skin and subcutaneous tissue: Secondary | ICD-10-CM

## 2023-09-03 NOTE — Therapy (Signed)
 OUTPATIENT PHYSICAL THERAPY  UPPER EXTREMITY ONCOLOGY  Patient Name: Nicole Maddox MRN: [id number] DOB:1981/02/03, 43 y.o., female Today's Date: 09/03/2023  END OF SESSION:  PT End of Session - 09/03/23 1255     Visit Number 2    Number of Visits 12    Date for PT Re-Evaluation 10/10/23    Authorization - Visit Number 2    Authorization - Number of Visits 21    PT Start Time 1100    PT Stop Time 1150    PT Time Calculation (min) 50 min    Activity Tolerance Patient tolerated treatment well    Behavior During Therapy WFL for tasks assessed/performed              Past Medical History:  Diagnosis Date   Breast cancer (HCC) 06/23/2022   Family history of adverse reaction to anesthesia    mother with h/o slow to wake   Family history of stomach cancer    GERD (gastroesophageal reflux disease)    Hyperthyroidism    due to Graves' disease   Multinodular thyroid    Personal history of chemotherapy    Personal history of radiation therapy    Thyroid disease    Past Surgical History:  Procedure Laterality Date   BREAST BIOPSY Left 06/23/2022   [place] LT BREAST BX W LOC DEV 1ST LESION IMG BX SPEC US GUIDE 06/23/2022 [facility]   BREAST BIOPSY Left 01/02/2023   [place] LT RADIOACTIVE SEED LOC 01/02/2023 [facility]   BREAST BIOPSY  01/02/2023   MM RT RADIOACTIVE SEED LOC MAMMO GUIDE 01/02/2023 [facility]   BREAST LUMPECTOMY Left 01/04/2023   BREAST LUMPECTOMY WITH RADIOACTIVE SEED AND SENTINEL LYMPH NODE BIOPSY Left 01/04/2023   Procedure: LEFT BREAST LUMPECTOMY WITH RADIOACTIVE SEED AND SENTINEL LYMPH NODE BIOPSY;  Surgeon: Griselda Miner, MD;  Location: [facility] OR;  Service: General;  Laterality: Left;  PEC BLOCK   BREAST LUMPECTOMY WITH RADIOACTIVE SEED LOCALIZATION Right 01/04/2023   Procedure: RIGHT BREAST LUMPECTOMY WITH RADIOACTIVE SEED LOCALIZATION;  Surgeon: Griselda Miner, MD;  Location: [facility] OR;  Service: General;  Laterality: Right;   BREAST REDUCTION WITH  MASTOPEXY Bilateral 01/04/2023   Procedure: BREAST REDUCTION WITH MASTOPEXY;  Surgeon: Peggye Form, DO;  Location: [facility] OR;  Service: Plastics;  Laterality: Bilateral;   PORT-A-CATH REMOVAL N/A 03/01/2023   Procedure: REMOVAL PORT-A-CATH;  Surgeon: Griselda Miner, MD;  Location: [facility];  Service: General;  Laterality: N/A;   PORTACATH PLACEMENT Right 07/19/2022   Procedure: INSERTION PORT-A-CATH;  Surgeon: Griselda Miner, MD;  Location: [facility];  Service: General;  Laterality: Right;  60 MIN ROOM 8   WISDOM TOOTH EXTRACTION     Patient Active Problem List   Diagnosis Date Noted   Elevated cholesterol 08/23/2023   Vitamin D deficiency 08/08/2023   Port-A-Cath in place 07/20/2022   Genetic testing 07/07/2022   Family history of stomach cancer 06/29/2022   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  It almost seems fine now.    EVAL: My left arm is sore to touch from the upper arm to the forearm and I feel like it is swollen. I tend to hold my arm across the waist because it feels better. I get occasional shooting pains in the breast that is not new. They called in an  antibiotic for me but I have not picked it up yet.  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 Completed chemotherapy. On 01/04/2023 pt had a Lt lumpectomy with 6 negative nodes. Completed radiation 03/29/23. On Capecitabine (Xeloda) chemo. Pt notices swelling in bilateral hands with walking which resolves  PAIN:  Are you having pain? Yes NPRS scale: 1/10 Pain location: left arm - forearm  Pain orientation: Left  PAIN TYPE: aching, tight, and constant Pain description: constant  Aggravating factors: movement, light touch Relieving factors: not moving  PRECAUTIONS: Lt lymphedema risk   RED FLAGS: None   WEIGHT BEARING RESTRICTIONS: No  FALLS:  Has patient fallen in last 6 months? No  LIVING ENVIRONMENT: Lives with: lives alone, sometimes goes to see her mom in [place], [place] Lives in: House/apartment  OCCUPATION: On leave  LEISURE: reading,  walking  HAND DOMINANCE: right   PRIOR LEVEL OF FUNCTION: Independent  PATIENT GOALS: get pain better, check swelling   OBJECTIVE: Note: Objective measures were completed at Evaluation unless otherwise noted.  COGNITION: Overall cognitive status: Within functional limits for tasks assessed   PALPATION: Tender throughout left forearm and upper arm, multiple cords palpable but not visible  OBSERVATIONS / OTHER ASSESSMENTS: pt very guarded/anxious and wants to hold arm across waist  SENSATION: Light touch: Deficits      POSTURE: forward head, rounded shoulders  UPPER EXTREMITY AROM/PROM:  A/PROM RIGHT   eval   Shoulder extension 51  Shoulder flexion 147  Shoulder abduction 175  Shoulder internal rotation   Shoulder external rotation     (Blank rows = not tested)  A/PROM LEFT   eval  Shoulder extension 33  Shoulder flexion 122  Shoulder abduction 90  Shoulder internal rotation   Shoulder external rotation     (Blank rows = not tested)  CERVICAL AROM: All within normal limits:    UPPER EXTREMITY STRENGTH:    LYMPHEDEMA ASSESSMENTS:  SURGERY TYPE/DATE: 01/04/2023 Left lumpectomy NUMBER OF LYMPH NODES REMOVED: 0/6 CHEMOTHERAPY: YES RADIATION:YES HORMONE TREATMENT: NO INFECTIONS: NO  LYMPHEDEMA ASSESSMENTS:   LANDMARK RIGHT  eval  At axilla  33.6  15 cm proximal to olecranon process   10 cm proximal to olecranon process 31.9  Olecranon process 27.7  15 cm proximal to ulnar styloid process   10 cm proximal to ulnar styloid process 24.05  Just proximal to ulnar styloid process 15.5  Across hand at thumb web space 19.35  At base of 2nd digit 5.65  (Blank rows = not tested)  LANDMARK LEFT  eval  At axilla  32.9  15 cm proximal to olecranon process   10 cm proximal to olecranon process 32.3  Olecranon process 27.4  15 cm proximal to ulnar styloid process   10 cm proximal to ulnar styloid process 23.7  Just proximal to ulnar styloid process 15.2  Across hand at thumb web space 18.8  At base of 2nd digit 5.7  (Blank rows = not tested)   FUNCTIONAL TESTS:    GAIT:WNL  QUICK DASH SURVEY: 39%  TREATMENT DATE:  09/03/23 Therapeutic Exercises Seated pulleys into flexion and abduction x each with reminder of movements Ball flexion 5" x 5  Single arm Left arm chest stretch on wall - feeling pull without much trunk rotation Manual Therapy  Lt arm cording release with arm at around 90deg of abduction on pillow and during PROM into flexion and D2 positions but not held long Opposing force pressure, S pressure, from axilla to wrist Discussed cording throughout answering all questions.   08/29/2023 Printed axillary web info for pt.. Instructed in and had pt perform 4 post op exercises x 5 reps, and will do 2x's per day. Also educated in wrist extension with arm in various positions where she feels tightness to loosen cords. Educated in POC, LOS, and treatment  interventions to decrease pain/cording. Measured circumference and performed SOZO to alleviate fear of lymphedema. Also advised she does not require antibiotics for this    PATIENT EDUCATION:  Education details: educated in cording and the need to gently stretch to improve, avoid guarding, 4 post op exercises to start to improve ROM, no lymphedema or infection Person educated: Patient Education method: Programmer, multimedia, Demonstration, and Handouts Education comprehension: verbalized understanding and returned demonstration  HOME EXERCISE PROGRAM: 4 post op exercises, wrist ext with arm in various positions to feel stretch  ASSESSMENT:  CLINICAL IMPRESSION: Cording is feeling better to patient already and reports the arm does not feel tender to touch but it is palpable from the axilla to the wrist in the left more medial arm.   OBJECTIVE IMPAIRMENTS: decreased activity tolerance, decreased knowledge of condition, decreased ROM, increased fascial restrictions, impaired UE functional use, postural dysfunction, and pain.   ACTIVITY LIMITATIONS: lifting and reach over head  PARTICIPATION LIMITATIONS: cleaning and yard work  PERSONAL FACTORS: 3+ comorbidities: Left  triple negative breast cancer s/p chemo and radiation  are also affecting patient's functional outcome.   REHAB POTENTIAL: Good  CLINICAL DECISION MAKING: Evolving/moderate complexity  EVALUATION COMPLEXITY: Moderate  GOALS: Goals reviewed with patient? Yes  SHORT TERM GOALS: Target date: 09/19/2023  Pt will be independent in HEP for stretching and strengthening of left UE Baseline: Goal status: INITIAL  2.  Pt will report pain/tenderness from cording improved by 25% Baseline:  Goal status: INITIAL  3.  Left shoulder ROM will be improved by 20 degrees fro shoulder flexion and 40 degrees for Abd for improved reaching Baseline: 122 flex, 90 abd Goal status: INITIAL    LONG TERM GOALS: Target date: 10/10/2023  Pain  will be improved by atleast 50% for improved function Baseline:  Goal status: INITIAL  2.  Left shoulder ROM will be within 10 degrees of Right shoulder ROM for improved reaching Baseline:  Goal status: INITIAL  3.  Quick dash will be no greater than 15% to demonstrate improved function in Left UE Baseline:  Goal status: INITIAL  PLAN:  PT FREQUENCY: 2x/week  PT DURATION: 6 weeks  PLANNED INTERVENTIONS: 97164- PT Re-evaluation, 97110-Therapeutic exercises, 97530- Therapeutic activity, 97112- Neuromuscular re-education, 97535- Self Care, 57846- Manual therapy, and 97760- Orthotic Fit/training, Dry Needling  PLAN FOR NEXT SESSION: Review HEP, pulleys, wand, MFR techniques to cording, cupping/IASTM prn for cording, progress to strength when ready  Idamae Lusher, PT 09/03/2023, 12:55 PM

## 2023-09-04 ENCOUNTER — Other Ambulatory Visit: Payer: Self-pay | Admitting: *Deleted

## 2023-09-04 DIAGNOSIS — C50412 Malignant neoplasm of upper-outer quadrant of left female breast: Secondary | ICD-10-CM

## 2023-09-04 MED ORDER — CAPECITABINE 500 MG PO TABS: 1000.0000 mg/m2 | ORAL_TABLET | Freq: Two times a day (BID) | ORAL | 3 refills | Status: DC

## 2023-09-05 ENCOUNTER — Encounter: Payer: Self-pay | Admitting: Bariatrics

## 2023-09-05 ENCOUNTER — Ambulatory Visit (INDEPENDENT_AMBULATORY_CARE_PROVIDER_SITE_OTHER): Admitting: Bariatrics

## 2023-09-05 VITALS — BP 101/62 | HR 83 | Temp 98.0°F | Ht 64.0 in | Wt 187.0 lb

## 2023-09-05 DIAGNOSIS — D649 Anemia, unspecified: Secondary | ICD-10-CM

## 2023-09-05 DIAGNOSIS — E669 Obesity, unspecified: Secondary | ICD-10-CM | POA: Diagnosis not present

## 2023-09-05 DIAGNOSIS — E7800 Pure hypercholesterolemia, unspecified: Secondary | ICD-10-CM

## 2023-09-05 DIAGNOSIS — E559 Vitamin D deficiency, unspecified: Secondary | ICD-10-CM | POA: Diagnosis not present

## 2023-09-05 DIAGNOSIS — Z6832 Body mass index (BMI) 32.0-32.9, adult: Secondary | ICD-10-CM

## 2023-09-05 MED ORDER — VITAMIN D (ERGOCALCIFEROL) 1.25 MG (50000 UNIT) PO CAPS: 50000.0000 [IU] | ORAL_CAPSULE | ORAL | 0 refills | Status: DC

## 2023-09-05 NOTE — Progress Notes (Signed)
 First follow-up after initial visit.        WEIGHT SUMMARY AND BIOMETRICS  Weight Lost Since Last Visit: 0  Weight Gained Since Last Visit: 0   Vitals Temp: 98 F (36.7 C) BP: 101/62 Pulse Rate: 83 SpO2: 100 %   Anthropometric Measurements Height: 5\' 4"  (1.626 m) Weight: 187 lb (84.8 kg) BMI (Calculated): 32.08 Weight at Last Visit: 187lb Weight Lost Since Last Visit: 0 Weight Gained Since Last Visit: 0 Starting Weight: 187lb Total Weight Loss (lbs): 0 lb (0 kg)   Body Composition  Body Fat %: 37.8 % Fat Mass (lbs): 708 lbs Muscle Mass (lbs): 110.8 lbs Total Body Water (lbs): 73.8 lbs Visceral Fat Rating : 8   Other Clinical Data Fasting: no Labs: no Today's Visit #: 2 Starting Date: 08/22/23    OBESITY Cinda is here to discuss her progress with her obesity treatment plan along with follow-up of her obesity related diagnoses.    Nutrition Plan: the Category 2 plan - 0% adherence.  Current exercise: walking  Interim History:  Her weight remains the same since her first visit. She states that she did not know that she was supposed to start the plan.  Eating all of the food on the plan., Protein intake is as prescribed, Is not skipping meals, Water intake is adequate., and Denies polyphagia  Initial positives regarding the dietary plan: N/A, eating more protein and drinking water.  Initial challenges regarding  the dietary plan: N/A  Pharmacotherapy: Corine is on Metformin 500 mg once daily breakfast Adverse side effects: None Hunger is moderately controlled.  Cravings are moderately controlled.  Assessment/Plan:   Anemia (unspecified):    She has a history of anemia that she states in secondary to her chemotherapy. See labs 08/01/2023. She has not started iron.   Plan: She will start her iron and can recheck in the future.    Vitamin D Deficiency Vitamin D is not at goal of 50.  Most recent vitamin D level was 20.3. She is not on vitamin D Lab Results  Component Value Date   VD25OH 20.3 (L) 08/22/2023    Plan: Begin  prescription vitamin D 50,000 IU weekly.   Elevated cholesterol:  LDL is not at goal. Medication(s): none Cardiovascular risk factors: obesity (BMI >= 30 kg/m2) and sedentary lifestyle Her HDL is high and her LDL/HDL ratio is good.  Lab Results  Component Value Date   CHOL 234 (H) 08/22/2023   HDL 103 08/22/2023   LDLCALC 115 (H) 08/22/2023   TRIG 92 08/22/2023   Lab Results  Component Value Date   ALT 16 08/22/2023   AST 19 08/22/2023   ALKPHOS 59 08/22/2023   BILITOT 0.5 08/22/2023   The ASCVD Risk score (Arnett DK, et al., 2019) failed to calculate for the following reasons:   The valid HDL cholesterol range is 20 to 100 mg/dL  Plan:  Will avoid all trans fats.  Will read labels Will minimize saturated fats except the following: low fat meats in moderation, diary, and limited dark chocolate.  Increase Omega 3 in foods, and consider an Omega 3 supplement.    Labs reviewed today (CMP, Lipids, CBC, vitamin D, glucose and TSH). .    Generalized Obesity: Current BMI BMI (Calculated): 32.08    Marvetta is currently in the action stage of change. As such, her goal is to continue with weight loss efforts.  She has agreed to the Category 2 plan.  Exercise goals: All adults should avoid  inactivity. Some physical activity is better than none, and adults who participate in any amount of physical activity gain some health benefits. She is walking about 3 days a week.  Behavioral modification strategies: increasing lean protein intake, decreasing simple carbohydrates , no meal skipping, meal planning , increase water intake, better snacking choices, increasing vegetables, keep healthy foods in the home, and weigh protein portions.  Afomia has agreed to follow-up with our clinic  in 2 weeks.    Labs reviewed today from last visit (CMP, Lipids, HgbA1c, insulin, vitamin D, B 12, and thyroid panel).   Objective:   VITALS: Per patient if applicable, see vitals. GENERAL: Alert and in no acute distress. CARDIOPULMONARY: No increased WOB. Speaking in clear sentences.  PSYCH: Pleasant and cooperative. Speech normal rate and rhythm. Affect is appropriate. Insight and judgement are appropriate. Attention is focused, linear, and appropriate.  NEURO: Oriented as arrived to appointment on time with no prompting.   Attestation Statements:   This was prepared with the assistance of Engineer, civil (consulting).  Occasional wrong-word or sound-a-like substitutions may have occurred due to the inherent limitations of voice recognition software.   Corinna Capra, DO

## 2023-09-06 ENCOUNTER — Ambulatory Visit: Attending: Hematology and Oncology

## 2023-09-06 DIAGNOSIS — C50412 Malignant neoplasm of upper-outer quadrant of left female breast: Secondary | ICD-10-CM

## 2023-09-06 DIAGNOSIS — Z483 Aftercare following surgery for neoplasm: Secondary | ICD-10-CM

## 2023-09-06 DIAGNOSIS — R293 Abnormal posture: Secondary | ICD-10-CM

## 2023-09-06 DIAGNOSIS — L7682 Other postprocedural complications of skin and subcutaneous tissue: Secondary | ICD-10-CM

## 2023-09-06 DIAGNOSIS — L905 Scar conditions and fibrosis of skin: Secondary | ICD-10-CM | POA: Diagnosis present

## 2023-09-06 DIAGNOSIS — Z9189 Other specified personal risk factors, not elsewhere classified: Secondary | ICD-10-CM

## 2023-09-06 DIAGNOSIS — R600 Localized edema: Secondary | ICD-10-CM

## 2023-09-06 NOTE — Therapy (Signed)
 OUTPATIENT PHYSICAL THERAPY  UPPER EXTREMITY ONCOLOGY  Patient Name: SYEDA PRICKETT MRN: [id number] DOB:08/04/1980, 43 y.o., female Today's Date: 09/06/2023  END OF SESSION:  PT End of Session - 09/06/23 1402     Visit Number 3    Number of Visits 12    Date for PT Re-Evaluation 10/10/23    Authorization Type none required but max 26/  used 5    Authorization - Visit Number 3    Authorization - Number of Visits 21    PT Start Time 1406    PT Stop Time 1458    PT Time Calculation (min) 52 min    Activity Tolerance Patient tolerated treatment well    Behavior During Therapy WFL for tasks assessed/performed              Past Medical History:  Diagnosis Date   Breast cancer (HCC) 06/23/2022   Family history of adverse reaction to anesthesia    mother with h/o slow to wake   Family history of stomach cancer    GERD (gastroesophageal reflux disease)    Hyperthyroidism    due to Graves' disease   Multinodular thyroid    Personal history of chemotherapy    Personal history of radiation therapy    Thyroid disease    Past Surgical History:  Procedure Laterality Date   BREAST BIOPSY Left 06/23/2022   [place] LT BREAST BX W LOC DEV 1ST LESION IMG BX SPEC US GUIDE 06/23/2022 [facility]   BREAST BIOPSY Left 01/02/2023   [place] LT RADIOACTIVE SEED LOC 01/02/2023 [facility]   BREAST BIOPSY  01/02/2023   MM RT RADIOACTIVE SEED LOC MAMMO GUIDE 01/02/2023 [facility]   BREAST LUMPECTOMY Left 01/04/2023   BREAST LUMPECTOMY WITH RADIOACTIVE SEED AND SENTINEL LYMPH NODE BIOPSY Left 01/04/2023   Procedure: LEFT BREAST LUMPECTOMY WITH RADIOACTIVE SEED AND SENTINEL LYMPH NODE BIOPSY;  Surgeon: Griselda Miner, MD;  Location: [facility] OR;  Service: General;  Laterality: Left;  PEC BLOCK   BREAST LUMPECTOMY WITH RADIOACTIVE SEED LOCALIZATION Right 01/04/2023   Procedure: RIGHT BREAST LUMPECTOMY WITH RADIOACTIVE SEED LOCALIZATION;  Surgeon: Griselda Miner, MD;  Location: [facility] OR;   Service: General;  Laterality: Right;   BREAST REDUCTION WITH MASTOPEXY Bilateral 01/04/2023   Procedure: BREAST REDUCTION WITH MASTOPEXY;  Surgeon: Peggye Form, DO;  Location: [facility] OR;  Service: Plastics;  Laterality: Bilateral;   PORT-A-CATH REMOVAL N/A 03/01/2023   Procedure: REMOVAL PORT-A-CATH;  Surgeon: Griselda Miner, MD;  Location: [facility];  Service: General;  Laterality: N/A;   PORTACATH PLACEMENT Right 07/19/2022   Procedure: INSERTION PORT-A-CATH;  Surgeon: Griselda Miner, MD;  Location: [facility];  Service: General;  Laterality: Right;  60 MIN ROOM 8   WISDOM TOOTH EXTRACTION     Patient Active Problem List   Diagnosis Date Noted   Elevated cholesterol 08/23/2023   Vitamin D deficiency 08/08/2023   Port-A-Cath in place 07/20/2022   Genetic testing 07/07/2022   Family history of stomach cancer 06/29/2022   Malignant neoplasm of upper-outer quadrant of left female breast (HCC) 06/27/2022   Seborrheic dermatitis of scalp 02/25/2018    PCP:   REFERRING PROVIDER: Rachel Moulds, MD  REFERRING DIAG: Left UE swelling/tenderness s/p breast Cancer  THERAPY DIAG:  Aftercare following surgery for neoplasm  Abnormal posture  Malignant neoplasm of upper-outer quadrant of left female breast, unspecified estrogen receptor status (HCC)  Axillary web syndrome  At risk for lymphedema  Localized edema  ONSET DATE: 08/24/2023  Rationale for Evaluation and Treatment: Rehabilitation  SUBJECTIVE:                                                                                                                                                                                           SUBJECTIVE STATEMENT:  I can feel the cord at the armpit.  I don't feel a cord at my thumb but it is tender there. I am moving better now . Pain is 50% better overall.  EVAL: My left arm is sore to touch from the upper arm to the forearm and I feel like it is  swollen. I tend to hold my arm across the waist because it feels better. I get occasional shooting pains in the breast that is not new. They called in an antibiotic for me but I have not picked it up yet.  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 Completed chemotherapy. On 01/04/2023 pt had a Lt lumpectomy with 6 negative nodes. Completed radiation 03/29/23. On Capecitabine (Xeloda) chemo. Pt notices swelling in bilateral hands with walking which resolves  PAIN:  Are you having pain? Yes NPRS scale: 3/10 Pain location: left thumb  Pain orientation: Left  PAIN TYPE: aching, tight, and constant Pain description: constant  Aggravating factors: movement, light touch Relieving factors: not moving  PRECAUTIONS: Lt lymphedema risk   RED FLAGS: None   WEIGHT BEARING RESTRICTIONS: No  FALLS:  Has patient fallen in last 6 months? No  LIVING ENVIRONMENT: Lives with: lives alone, sometimes goes to see her mom in [place], [place] Lives in: House/apartment  OCCUPATION: On leave  LEISURE: reading,  walking  HAND DOMINANCE: right   PRIOR LEVEL OF FUNCTION: Independent  PATIENT GOALS: get pain better, check swelling   OBJECTIVE: Note: Objective measures were completed at Evaluation unless otherwise noted.  COGNITION: Overall cognitive status: Within functional limits for tasks assessed   PALPATION: Tender throughout left forearm and upper arm, multiple cords palpable but not visible  OBSERVATIONS / OTHER ASSESSMENTS: pt very guarded/anxious and wants to hold arm across waist  SENSATION: Light touch: Deficits      POSTURE: forward head, rounded shoulders  UPPER EXTREMITY AROM/PROM:  A/PROM RIGHT   eval   Shoulder extension 51  Shoulder flexion 147  Shoulder abduction 175  Shoulder internal rotation   Shoulder external rotation     (Blank rows = not tested)  A/PROM LEFT   eval  Shoulder extension 33  Shoulder flexion 122  Shoulder abduction 90  Shoulder internal  rotation   Shoulder external rotation     (Blank rows = not tested)  CERVICAL AROM: All within normal limits:    UPPER EXTREMITY STRENGTH:   LYMPHEDEMA ASSESSMENTS:  SURGERY TYPE/DATE: 01/04/2023 Left lumpectomy NUMBER OF LYMPH NODES REMOVED: 0/6 CHEMOTHERAPY: YES RADIATION:YES HORMONE TREATMENT: NO INFECTIONS: NO  LYMPHEDEMA ASSESSMENTS:   LANDMARK RIGHT  eval  At axilla  33.6  15 cm proximal to olecranon process   10 cm proximal to olecranon process 31.9  Olecranon process 27.7  15 cm proximal to ulnar styloid process   10 cm proximal to ulnar styloid process 24.05  Just proximal to ulnar styloid process 15.5  Across hand at thumb web space 19.35  At base of 2nd digit 5.65  (Blank rows = not tested)  LANDMARK LEFT  eval  At axilla  32.9  15 cm proximal to olecranon process   10 cm proximal to olecranon process 32.3  Olecranon process 27.4  15 cm proximal to ulnar styloid process   10 cm proximal to ulnar styloid process 23.7  Just proximal to ulnar styloid process 15.2  Across hand at thumb web space 18.8  At base of 2nd digit 5.7  (Blank rows = not tested)   FUNCTIONAL TESTS:    GAIT:WNL  QUICK DASH SURVEY: 39%                                                                                                                            TREATMENT DATE:  09/06/2023 Pulleys into flexion and abd for 2:30 sec each Ball rolls flexion x 10, abd on left x 5  Single arm chest stretch at wall 3 x 20 MANUAL MFR longitudinal stretch and "S"with arm at 90 degrees and 120 degrees on pillow to decrease cording Cupping to upper arm antecubital fossa ad forearm areas of cording and  with smallest cup left lateral thumb PROM left shoulder flexion, abd, D2 flex  09/03/23 Therapeutic Exercises Seated pulleys into flexion and abduction x each with reminder of movements Ball flexion 5" x 5  Single arm Left arm chest stretch on wall - feeling pull without much trunk  rotation Manual Therapy  Lt arm cording release with arm at around 90deg of abduction on pillow and during PROM into flexion and D2 positions but not held long Opposing force pressure, S pressure, from axilla to wrist Discussed cording throughout answering all questions.   08/29/2023 Printed axillary web info for pt.. Instructed in and had pt perform 4 post op exercises x 5 reps, and will do 2x's per day. Also educated in wrist extension with arm in various positions where she feels tightness to loosen cords. Educated in POC, LOS, and treatment interventions to decrease pain/cording. Measured circumference and performed SOZO to alleviate fear of lymphedema. Also advised she does not require antibiotics for this    PATIENT EDUCATION:  Education details: educated in cording and the need to gently stretch to improve, avoid guarding, 4 post op exercises to start to improve ROM, no lymphedema or infection Person educated: Patient  Education method: Explanation, Demonstration, and Handouts Education comprehension: verbalized understanding and returned demonstration  HOME EXERCISE PROGRAM: 4 post op exercises, wrist ext with arm in various positions to feel stretch  ASSESSMENT:  CLINICAL IMPRESSION:  Pain has improved 50% overall but multiple tight cords remain in axilla, upper arm and forearm. Pts shoulder ROM is improving. Pt met pain goals today. OBJECTIVE IMPAIRMENTS: decreased activity tolerance, decreased knowledge of condition, decreased ROM, increased fascial restrictions, impaired UE functional use, postural dysfunction, and pain.   ACTIVITY LIMITATIONS: lifting and reach over head  PARTICIPATION LIMITATIONS: cleaning and yard work  PERSONAL FACTORS: 3+ comorbidities: Left  triple negative breast cancer s/p chemo and radiation  are also affecting patient's functional outcome.   REHAB POTENTIAL: Good  CLINICAL DECISION MAKING: Evolving/moderate complexity  EVALUATION COMPLEXITY:  Moderate  GOALS: Goals reviewed with patient? Yes  SHORT TERM GOALS: Target date: 09/19/2023  Pt will be independent in HEP for stretching and strengthening of left UE Baseline: Goal status: INITIAL  2.  Pt will report pain/tenderness from cording improved by 25% Baseline:  Goal status: MET 09/06/2023 3.  Left shoulder ROM will be improved by 20 degrees fro shoulder flexion and 40 degrees for Abd for improved reaching Baseline: 122 flex, 90 abd Goal status: INITIAL    LONG TERM GOALS: Target date: 10/10/2023  Pain will be improved by atleast 50% for improved function Baseline:  Goal status: MET 09/06/2023 2.  Left shoulder ROM will be within 10 degrees of Right shoulder ROM for improved reaching Baseline:  Goal status: INITIAL  3.  Quick dash will be no greater than 15% to demonstrate improved function in Left UE Baseline:  Goal status: INITIAL  PLAN:  PT FREQUENCY: 2x/week  PT DURATION: 6 weeks  PLANNED INTERVENTIONS: 97164- PT Re-evaluation, 97110-Therapeutic exercises, 97530- Therapeutic activity, 97112- Neuromuscular re-education, 97535- Self Care, 16109- Manual therapy, and 97760- Orthotic Fit/training, Dry Needling  PLAN FOR NEXT SESSION: Review HEP, pulleys, wand, MFR techniques to cording, cupping/IASTM prn for cording, progress to strength when ready  Waynette Buttery, PT 09/06/2023, 2:59 PM

## 2023-09-10 ENCOUNTER — Ambulatory Visit: Admitting: Rehabilitation

## 2023-09-10 ENCOUNTER — Encounter: Payer: Self-pay | Admitting: Rehabilitation

## 2023-09-10 DIAGNOSIS — Z483 Aftercare following surgery for neoplasm: Secondary | ICD-10-CM

## 2023-09-10 DIAGNOSIS — C50412 Malignant neoplasm of upper-outer quadrant of left female breast: Secondary | ICD-10-CM

## 2023-09-10 DIAGNOSIS — L7682 Other postprocedural complications of skin and subcutaneous tissue: Secondary | ICD-10-CM

## 2023-09-10 DIAGNOSIS — R600 Localized edema: Secondary | ICD-10-CM

## 2023-09-10 DIAGNOSIS — Z9189 Other specified personal risk factors, not elsewhere classified: Secondary | ICD-10-CM

## 2023-09-10 DIAGNOSIS — R293 Abnormal posture: Secondary | ICD-10-CM

## 2023-09-10 NOTE — Therapy (Signed)
 OUTPATIENT PHYSICAL THERAPY  UPPER EXTREMITY ONCOLOGY  Patient Name: Nicole Maddox MRN: [id number] DOB:18-Mar-1981, 43 y.o., female Today's Date: 09/10/2023  END OF SESSION:  PT End of Session - 09/10/23 1303     Visit Number 4    Number of Visits 12    Date for PT Re-Evaluation 10/10/23    Authorization - Visit Number 4    Authorization - Number of Visits 21    PT Start Time 1206    PT Stop Time 1259    PT Time Calculation (min) 53 min    Activity Tolerance Patient tolerated treatment well    Behavior During Therapy WFL for tasks assessed/performed               Past Medical History:  Diagnosis Date   Breast cancer (HCC) 06/23/2022   Family history of adverse reaction to anesthesia    mother with h/o slow to wake   Family history of stomach cancer    GERD (gastroesophageal reflux disease)    Hyperthyroidism    due to Graves' disease   Multinodular thyroid    Personal history of chemotherapy    Personal history of radiation therapy    Thyroid disease    Past Surgical History:  Procedure Laterality Date   BREAST BIOPSY Left 06/23/2022   [place] LT BREAST BX W LOC DEV 1ST LESION IMG BX SPEC US GUIDE 06/23/2022 [facility]   BREAST BIOPSY Left 01/02/2023   [place] LT RADIOACTIVE SEED LOC 01/02/2023 [facility]   BREAST BIOPSY  01/02/2023   MM RT RADIOACTIVE SEED LOC MAMMO GUIDE 01/02/2023 [facility]   BREAST LUMPECTOMY Left 01/04/2023   BREAST LUMPECTOMY WITH RADIOACTIVE SEED AND SENTINEL LYMPH NODE BIOPSY Left 01/04/2023   Procedure: LEFT BREAST LUMPECTOMY WITH RADIOACTIVE SEED AND SENTINEL LYMPH NODE BIOPSY;  Surgeon: Griselda Miner, MD;  Location: [facility] OR;  Service: General;  Laterality: Left;  PEC BLOCK   BREAST LUMPECTOMY WITH RADIOACTIVE SEED LOCALIZATION Right 01/04/2023   Procedure: RIGHT BREAST LUMPECTOMY WITH RADIOACTIVE SEED LOCALIZATION;  Surgeon: Griselda Miner, MD;  Location: [facility] OR;  Service: General;  Laterality: Right;   BREAST REDUCTION  WITH MASTOPEXY Bilateral 01/04/2023   Procedure: BREAST REDUCTION WITH MASTOPEXY;  Surgeon: Peggye Form, DO;  Location: [facility] OR;  Service: Plastics;  Laterality: Bilateral;   PORT-A-CATH REMOVAL N/A 03/01/2023   Procedure: REMOVAL PORT-A-CATH;  Surgeon: Griselda Miner, MD;  Location: [facility];  Service: General;  Laterality: N/A;   PORTACATH PLACEMENT Right 07/19/2022   Procedure: INSERTION PORT-A-CATH;  Surgeon: Griselda Miner, MD;  Location: [facility];  Service: General;  Laterality: Right;  60 MIN ROOM 8   WISDOM TOOTH EXTRACTION     Patient Active Problem List   Diagnosis Date Noted   Elevated cholesterol 08/23/2023   Vitamin D deficiency 08/08/2023   Port-A-Cath in place 07/20/2022   Genetic testing 07/07/2022   Family history of stomach cancer 06/29/2022   Malignant neoplasm of upper-outer quadrant of left female breast (HCC) 06/27/2022   Seborrheic dermatitis of scalp 02/25/2018    PCP:   REFERRING PROVIDER: Rachel Moulds, MD  REFERRING DIAG: Left UE swelling/tenderness s/p breast Cancer  THERAPY DIAG:  Aftercare following surgery for neoplasm  Abnormal posture  Malignant neoplasm of upper-outer quadrant of left female breast, unspecified estrogen receptor status (HCC)  Axillary web syndrome  At risk for lymphedema  Localized edema  ONSET DATE: 08/24/2023  Rationale for Evaluation and Treatment: Rehabilitation  SUBJECTIVE:                                                                                                                                                                                           SUBJECTIVE STATEMENT:  The arm is the same.  Some days it does not hurt and some days it does hurt, but overall better.    EVAL: My left arm is sore to touch from the upper arm to the forearm and I feel like it is swollen. I tend to hold my arm across the waist because it feels better. I get occasional shooting pains in  the breast that is not new. They called in an antibiotic for me but I have not picked it up yet.  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 Completed chemotherapy. On 01/04/2023 pt had a Lt lumpectomy with 6 negative nodes. Completed radiation 03/29/23. On Capecitabine (Xeloda) chemo. Pt notices swelling in bilateral hands with walking which resolves  PAIN:  Are you having pain? Yes NPRS scale: 3/10 Pain location: left thumb up to elbow.   Pain orientation: Left  PAIN TYPE: aching, tight, and constant Pain description: constant  Aggravating factors: movement, light touch Relieving factors: not moving  PRECAUTIONS: Lt lymphedema risk   RED FLAGS: None   WEIGHT BEARING RESTRICTIONS: No  FALLS:  Has patient fallen in last 6 months? No  LIVING ENVIRONMENT: Lives with: lives alone, sometimes goes to see her mom in [place], [place] Lives in: House/apartment  OCCUPATION: On leave  LEISURE: reading,  walking  HAND DOMINANCE: right   PRIOR LEVEL OF FUNCTION: Independent  PATIENT GOALS: get pain better, check swelling   OBJECTIVE: Note: Objective measures were completed at Evaluation unless otherwise noted.  COGNITION: Overall cognitive status: Within functional limits for tasks assessed   PALPATION: Tender throughout left forearm and upper arm, multiple cords palpable but not visible  OBSERVATIONS / OTHER ASSESSMENTS: pt very guarded/anxious and wants to hold arm across waist  SENSATION: Light touch: Deficits      POSTURE: forward head, rounded shoulders  UPPER EXTREMITY AROM/PROM:  A/PROM RIGHT   eval   Shoulder extension 51  Shoulder flexion 147  Shoulder abduction 175  Shoulder internal rotation   Shoulder external rotation     (Blank rows = not tested)  A/PROM LEFT   eval  Shoulder extension 33  Shoulder flexion 122  Shoulder abduction 90  Shoulder internal rotation   Shoulder external rotation     (Blank rows = not tested)  CERVICAL AROM: All  within normal limits:    UPPER EXTREMITY STRENGTH:   LYMPHEDEMA ASSESSMENTS:  SURGERY TYPE/DATE: 01/04/2023 Left lumpectomy NUMBER OF LYMPH NODES REMOVED: 0/6 CHEMOTHERAPY: YES RADIATION:YES HORMONE TREATMENT: NO INFECTIONS: NO  LYMPHEDEMA ASSESSMENTS:   LANDMARK RIGHT  eval  At axilla  33.6  15 cm proximal to olecranon process   10 cm proximal to olecranon process 31.9  Olecranon process 27.7  15 cm proximal to ulnar styloid process   10 cm proximal to ulnar styloid process 24.05  Just proximal to ulnar styloid process 15.5  Across hand at thumb web space 19.35  At base of 2nd digit 5.65  (Blank rows = not tested)  LANDMARK LEFT  eval  At axilla  32.9  15 cm proximal to olecranon process   10 cm proximal to olecranon process 32.3  Olecranon process 27.4  15 cm proximal to ulnar styloid process   10 cm proximal to ulnar styloid process 23.7  Just proximal to ulnar styloid process 15.2  Across hand at thumb web space 18.8  At base of 2nd digit 5.7  (Blank rows = not tested)   FUNCTIONAL TESTS:    GAIT:WNL  QUICK DASH SURVEY: 39%                                                                                                                            TREATMENT DATE: 09/10/2023 Pulleys into flexion and abd for 2:30 sec each Ball rolls flexion x 10, abd on left x 5 Supine over half foam roll: prolong chest stretch 2x60", snow angel x 8, bil horizontal abduction, bil Y motion x 8, alternating flexion x 6 MANUAL MFR longitudinal stretch and "S"with arm at 90 degrees and 120 degrees on pillow to decrease cording PROM left shoulder flexion, abd, D2 flex  09/06/2023 Pulleys into flexion and abd for 2:30 sec each Ball rolls flexion x 10, abd on left x 5  Single arm chest stretch at wall 3 x 20 MANUAL MFR longitudinal stretch and "S"with arm at 90 degrees and 120 degrees on pillow to decrease cording Cupping to upper arm antecubital fossa ad forearm areas of cording  and  with smallest cup left lateral thumb PROM left shoulder flexion, abd, D2 flex  09/03/23 Therapeutic Exercises Seated pulleys into flexion and abduction x each with reminder of movements Ball flexion 5" x 5  Single arm Left arm chest stretch on wall - feeling pull without much trunk rotation Manual Therapy  Lt arm cording release with arm at around 90deg of abduction on pillow and during PROM into flexion and D2 positions but not held long Opposing force pressure, S pressure, from axilla to wrist Discussed cording throughout answering all questions.   08/29/2023 Printed axillary web info for pt.. Instructed in and had pt perform 4 post op exercises x 5 reps, and will do 2x's per day. Also educated in wrist extension with arm in various positions where she feels tightness to loosen cords. Educated in POC, LOS, and treatment interventions to decrease pain/cording. Measured circumference and  performed SOZO to alleviate fear of lymphedema. Also advised she does not require antibiotics for this    PATIENT EDUCATION:  Education details: educated in cording and the need to gently stretch to improve, avoid guarding, 4 post op exercises to start to improve ROM, no lymphedema or infection Person educated: Patient Education method: Programmer, multimedia, Demonstration, and Handouts Education comprehension: verbalized understanding and returned demonstration  HOME EXERCISE PROGRAM: 4 post op exercises, wrist ext with arm in various positions to feel stretch  ASSESSMENT:  CLINICAL IMPRESSION:  Pain has improved 50% overall but multiple tight cords remain in axilla, upper arm and forearm.   OBJECTIVE IMPAIRMENTS: decreased activity tolerance, decreased knowledge of condition, decreased ROM, increased fascial restrictions, impaired UE functional use, postural dysfunction, and pain.   ACTIVITY LIMITATIONS: lifting and reach over head  PARTICIPATION LIMITATIONS: cleaning and yard work  PERSONAL  FACTORS: 3+ comorbidities: Left  triple negative breast cancer s/p chemo and radiation  are also affecting patient's functional outcome.   REHAB POTENTIAL: Good  CLINICAL DECISION MAKING: Evolving/moderate complexity  EVALUATION COMPLEXITY: Moderate  GOALS: Goals reviewed with patient? Yes  SHORT TERM GOALS: Target date: 09/19/2023  Pt will be independent in HEP for stretching and strengthening of left UE Baseline: Goal status: INITIAL  2.  Pt will report pain/tenderness from cording improved by 25% Baseline:  Goal status: MET 09/06/2023 3.  Left shoulder ROM will be improved by 20 degrees fro shoulder flexion and 40 degrees for Abd for improved reaching Baseline: 122 flex, 90 abd Goal status: INITIAL    LONG TERM GOALS: Target date: 10/10/2023  Pain will be improved by atleast 50% for improved function Baseline:  Goal status: MET 09/06/2023 2.  Left shoulder ROM will be within 10 degrees of Right shoulder ROM for improved reaching Baseline:  Goal status: INITIAL  3.  Quick dash will be no greater than 15% to demonstrate improved function in Left UE Baseline:  Goal status: INITIAL  PLAN:  PT FREQUENCY: 2x/week  PT DURATION: 6 weeks  PLANNED INTERVENTIONS: 97164- PT Re-evaluation, 97110-Therapeutic exercises, 97530- Therapeutic activity, 97112- Neuromuscular re-education, 97535- Self Care, 19147- Manual therapy, and 97760- Orthotic Fit/training, Dry Needling  PLAN FOR NEXT SESSION: Review HEP, pulleys, wand, MFR techniques to cording, cupping/IASTM prn for cording, progress to strength when ready  Idamae Lusher, PT 09/10/2023, 1:04 PM

## 2023-09-11 NOTE — Progress Notes (Signed)
 Oncology History  Malignant neoplasm of upper-outer quadrant of left female breast (HCC)  06/23/2022 Initial Diagnosis   Left breast needle core biopsy at 1:00, 10 cmfn: IDC< grade 3, 1.1 cm, ER 30% positive, weak staining intensity, PR negative, Ki-67 95%, HER2 negative.  Axillary node negative.    06/28/2022 Cancer Staging   Staging form: Breast, AJCC 8th Edition - Clinical stage from 06/28/2022: Stage IB (cT1c, cN0(f), cM0, G3, ER+, PR-, HER2-) - Signed by Ronny Bacon, PA-C on 06/28/2022 Stage prefix: Initial diagnosis Method of lymph node assessment: Core biopsy Histologic grading system: 3 grade system   07/11/2022 Genetic Testing   Negative genetic testing on the 9 gene STAT panel.  The report date is July 05, 2022. Negative genetic testing on the Multi-cancer panel.  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  BLM c.3136G>A (p.Gly1046Ser), EGFR c.61G>A (p.Ala21Thr) and PDGFRA c.50G>T (p.Gly17Val) VUS identified.  The report date is July 11, 2022.  The STAT Breast cancer panel offered by [facility] includes sequencing and rearrangement analysis for the following 9 genes:  ATM, BRCA1, BRCA2, CDH1, CHEK2, PALB2, PTEN, STK11 and TP53.   The Multi-Cancer + RNA Panel offered by [facility] includes sequencing and/or deletion/duplication analysis of the following 70 genes:  AIP*, ALK, APC*, ATM*, AXIN2*, BAP1*, BARD1*, BLM*, BMPR1A*, BRCA1*, BRCA2*, BRIP1*, CDC73*, CDH1*, CDK4, CDKN1B*, CDKN2A, CHEK2*, CTNNA1*, DICER1*, EPCAM (del/dup only), EGFR, FH*, FLCN*, GREM1 (promoter dup only), HOXB13, KIT, LZTR1, MAX*, MBD4, MEN1*, MET, MITF, MLH1*, MSH2*, MSH3*, MSH6*, MUTYH*, NF1*, NF2*, NTHL1*, PALB2*, PDGFRA, PMS2*, POLD1*, POLE*, POT1*, PRKAR1A*, PTCH1*, PTEN*, RAD51C*, RAD51D*, RB1*, RET, SDHA* (sequencing only), SDHAF2*, SDHB*, SDHC*, SDHD*, SMAD4*, SMARCA4*, SMARCB1*, SMARCE1*, STK11*, SUFU*, TMEM127*, TP53*, TSC1*, TSC2*, VHL*. RNA analysis is performed for * genes.    07/20/2022 - 11/30/2022 Chemotherapy    Patient is on Treatment Plan : BREAST ADJUVANT DOSE DENSE AC q14d / PACLitaxel q7d     07/21/2022 Procedure   Right breast needle core biopsy upper outer breast x 2: Negative for malignancy, left breast needle core biopsy lower: Negative for malignancy.   08/07/2022 Procedure   2 additional right breast needle core biopsies both negative for malignancy   01/04/2023 Surgery   Left breast lumpectomy: Invasive ductal carcinoma, 1.6 cm, grade 3, margins negative, 6 lymph nodes negative for malignancy.  Repeat prognostic panel: Triple negative breast cancer with a Ki67 of 90%.  Right breast lumpectomy: Negative for carcinoma.  Patient also underwent bilateral mammoplasties and path was negative for carcinoma on both specimen submitted.    - 03/29/2023 Radiation Therapy   Left breast: 40.05 Gy in 15 treatments "Boost": 10 cGy in 5 treatments    04/17/2023 -  Chemotherapy   Patient is on Treatment Plan : BREAST Capecitabine q21d       INTERVAL HISTORY:  Nicole Maddox is here for follow up.  Discussed the use of AI scribe software for clinical note transcription with the patient, who gave verbal consent to proceed.  History of Present Illness    Nicole Maddox is a 43 year old female with triple negative breast cancer currently on adj xeloda who is here for follow up.  Rest of the pertinent 10 point ROS reviewed and neg.  PAST MEDICAL/SURGICAL HISTORY:  Past Medical History:  Diagnosis Date   Breast cancer (HCC) 06/23/2022   Family history of adverse reaction to anesthesia    mother with h/o slow to wake   Family history of stomach cancer    GERD (gastroesophageal reflux disease)    Hyperthyroidism  due to Graves' disease   Multinodular thyroid    Personal history of chemotherapy    Personal history of radiation therapy    Thyroid disease    Past Surgical History:  Procedure Laterality Date   BREAST BIOPSY Left 06/23/2022   [place] LT BREAST BX W LOC DEV 1ST LESION IMG BX SPEC US GUIDE  06/23/2022 [facility]   BREAST BIOPSY Left 01/02/2023   [place] LT RADIOACTIVE SEED LOC 01/02/2023 [facility]   BREAST BIOPSY  01/02/2023   MM RT RADIOACTIVE SEED LOC MAMMO GUIDE 01/02/2023 [facility]   BREAST LUMPECTOMY Left 01/04/2023   BREAST LUMPECTOMY WITH RADIOACTIVE SEED AND SENTINEL LYMPH NODE BIOPSY Left 01/04/2023   Procedure: LEFT BREAST LUMPECTOMY WITH RADIOACTIVE SEED AND SENTINEL LYMPH NODE BIOPSY;  Surgeon: Griselda Miner, MD;  Location: [facility] OR;  Service: General;  Laterality: Left;  PEC BLOCK   BREAST LUMPECTOMY WITH RADIOACTIVE SEED LOCALIZATION Right 01/04/2023   Procedure: RIGHT BREAST LUMPECTOMY WITH RADIOACTIVE SEED LOCALIZATION;  Surgeon: Griselda Miner, MD;  Location[facility] OR;  Service: General;  Laterality: Right;   BREAST REDUCTION WITH MASTOPEXY Bilateral 01/04/2023   Procedure: BREAST REDUCTION WITH MASTOPEXY;  Surgeon: Peggye Form, DO;  Location: [facility] OR;  Service: Plastics;  Laterality: Bilateral;   PORT-A-CATH REMOVAL N/A 03/01/2023   Procedure: REMOVAL PORT-A-CATH;  Surgeon: Griselda Miner, MD;  Location: [facility];  Service: General;  Laterality: N/A;   PORTACATH PLACEMENT Right 07/19/2022   Procedure: INSERTION PORT-A-CATH;  Surgeon: Griselda Miner, MD;  Location:  SURGERY CENTER;  Service: General;  Laterality: Right;  60 MIN ROOM 8   WISDOM TOOTH EXTRACTION       ALLERGIES:  Allergies  Allergen Reactions   Adriamycin [Doxorubicin] Other (See Comments)    Patient c/o of lower back pain and right arm pain. See progress note from 07/20/22     CURRENT MEDICATIONS:  Outpatient Encounter Medications as of 09/12/2023  Medication Sig   capecitabine (XELODA) 500 MG tablet Take 4 tablets (2,000 mg total) by mouth 2 (two) times daily after a meal. Take on days 1-14. Repeat every 21 days.   COD LIVER OIL PO Take by mouth daily.   Cyanocobalamin (VITAMIN B-12 PO) Take by mouth daily.   DENTA 5000 PLUS 1.1 % CREA dental  cream Place 1 Application onto teeth daily.   diclofenac Sodium (VOLTAREN) 1 % GEL Research Patient: Apply 0.5 grams (1 fingertip) to each hand and each foot twice daily for up to 12 weeks   doxycycline (VIBRA-TABS) 100 MG tablet Take 1 tablet (100 mg total) by mouth 2 (two) times daily.   folic acid (FOLVITE) 1 MG tablet Take 1 mg by mouth daily.   loratadine (CLARITIN) 10 MG tablet Take 10 mg by mouth daily.   metFORMIN (GLUCOPHAGE) 500 MG tablet    methimazole (TAPAZOLE) 5 MG tablet Take 15 mg by mouth 2 (two) times daily.   omeprazole (PRILOSEC) 40 MG capsule Take 40 mg by mouth daily.   Vitamin D, Ergocalciferol, (DRISDOL) 1.25 MG (50000 UNIT) CAPS capsule Take 1 capsule (50,000 Units total) by mouth every 7 (seven) days.   No facility-administered encounter medications on file as of 09/12/2023.     ONCOLOGIC FAMILY HISTORY:  Family History  Problem Relation Age of Onset   Sleep apnea Mother    Obesity Mother    Heart attack Father 43   Heart Problems Maternal Grandmother    Stomach cancer Maternal Aunt  dx > 50   Throat cancer Maternal Uncle        dx 40s   Stomach cancer Maternal Uncle        dx > 50   Breast cancer Cousin        mother's maternal first cousin     SOCIAL HISTORY:  Social History   Socioeconomic History   Marital status: Single    Spouse name: Not on file   Number of children: Not on file   Years of education: Not on file   Highest education level: Not on file  Occupational History   Not on file  Tobacco Use   Smoking status: Never   Smokeless tobacco: Never  Vaping Use   Vaping status: Never Used  Substance and Sexual Activity   Alcohol use: Yes    Comment: socially   Drug use: Never   Sexual activity: Not Currently    Partners: Male    Birth control/protection: Condom    Comment: intercourse age 58, more than 6 sexual parters,   Other Topics Concern   Not on file  Social History Narrative   Not on file   Social Drivers of  Health   Financial Resource Strain: Low Risk  (07/04/2022)   Overall Financial Resource Strain (CARDIA)    Difficulty of Paying Living Expenses: Not very hard  Food Insecurity: No Food Insecurity (06/28/2022)   Hunger Vital Sign    Worried About Running Out of Food in the Last Year: Never true    Ran Out of Food in the Last Year: Never true  Transportation Needs: No Transportation Needs (06/28/2022)   PRAPARE - Administrator, Civil Service (Medical): No    Lack of Transportation (Non-Medical): No  Physical Activity: Not on file  Stress: Not on file  Social Connections: Unknown (01/09/2023)   Received from [facility]   Social Connections    Frequency of Communication with Friends and Family: Not asked    Frequency of Social Gatherings with Friends and Family: Not asked  Intimate Partner Violence: Unknown (01/09/2023)   [facility] from [facility]   Intimate Partner Violence    Fear of Current or Ex-Partner: Not asked    Emotionally Abused: Not asked    Physically Abused: Not asked    Sexually Abused: Not asked    OBSERVATIONS/OBJECTIVE:  Physical Exam Constitutional:      Appearance: Normal appearance.  Cardiovascular:     Rate and Rhythm: Normal rate and regular rhythm.     Pulses: Normal pulses.     Heart sounds: Normal heart sounds.  Pulmonary:     Effort: Pulmonary effort is normal.     Breath sounds: Normal breath sounds.  Musculoskeletal:        General: No swelling. Normal range of motion.     Cervical back: Normal range of motion.  Lymphadenopathy:     Cervical: No cervical adenopathy.  Skin:    General: Skin is warm and dry.  Neurological:     General: No focal deficit present.     Mental Status: She is alert.    Bilaterals breasts inspected. No palpable masses or regional adenopathy.  LABORATORY DATA:  None for this visit.  DIAGNOSTIC IMAGING:  None for this visit.   Assessment and Plan  This is a very pleasant 43 year old female patient,  premenopausal with newly diagnosed left breast invasive ductal carcinoma ER 30% weak staining, PR negative, HER2 negative, Ki-67 of 95%, high-grade referred to medical oncology  who is here for follow-up.     Time spent: 30 min *Total Encounter Time as defined by the Centers for Medicare and Medicaid Services includes, in addition to the face-to-face time of a patient visit (documented in the note above) non-face-to-face time: obtaining and reviewing outside history, ordering and reviewing medications, tests or procedures, care coordination (communications with other health care professionals or caregivers) and documentation in the medical record.

## 2023-09-12 ENCOUNTER — Inpatient Hospital Stay: Attending: Hematology and Oncology

## 2023-09-12 ENCOUNTER — Inpatient Hospital Stay (HOSPITAL_BASED_OUTPATIENT_CLINIC_OR_DEPARTMENT_OTHER): Admitting: Hematology and Oncology

## 2023-09-12 VITALS — BP 105/64 | HR 84 | Temp 97.7°F | Resp 17 | Wt 194.6 lb

## 2023-09-12 DIAGNOSIS — Z1722 Progesterone receptor negative status: Secondary | ICD-10-CM | POA: Insufficient documentation

## 2023-09-12 DIAGNOSIS — D649 Anemia, unspecified: Secondary | ICD-10-CM | POA: Insufficient documentation

## 2023-09-12 DIAGNOSIS — C50412 Malignant neoplasm of upper-outer quadrant of left female breast: Secondary | ICD-10-CM | POA: Diagnosis present

## 2023-09-12 DIAGNOSIS — Z79620 Long term (current) use of immunosuppressive biologic: Secondary | ICD-10-CM | POA: Insufficient documentation

## 2023-09-12 DIAGNOSIS — R112 Nausea with vomiting, unspecified: Secondary | ICD-10-CM | POA: Insufficient documentation

## 2023-09-12 DIAGNOSIS — L271 Localized skin eruption due to drugs and medicaments taken internally: Secondary | ICD-10-CM | POA: Diagnosis not present

## 2023-09-12 DIAGNOSIS — Z79899 Other long term (current) drug therapy: Secondary | ICD-10-CM | POA: Insufficient documentation

## 2023-09-12 DIAGNOSIS — R5383 Other fatigue: Secondary | ICD-10-CM | POA: Diagnosis not present

## 2023-09-12 DIAGNOSIS — Z170 Estrogen receptor positive status [ER+]: Secondary | ICD-10-CM

## 2023-09-12 DIAGNOSIS — Z171 Estrogen receptor negative status [ER-]: Secondary | ICD-10-CM | POA: Diagnosis not present

## 2023-09-12 DIAGNOSIS — Z1732 Human epidermal growth factor receptor 2 negative status: Secondary | ICD-10-CM | POA: Insufficient documentation

## 2023-09-12 LAB — CMP (CANCER CENTER ONLY)
ALT: 22 U/L (ref 0–44)
AST: 24 U/L (ref 15–41)
Albumin: 4.2 g/dL (ref 3.5–5.0)
Alkaline Phosphatase: 60 U/L (ref 38–126)
Anion gap: 7 (ref 5–15)
BUN: 7 mg/dL (ref 6–20)
CO2: 27 mmol/L (ref 22–32)
Calcium: 8.5 mg/dL — ABNORMAL LOW (ref 8.9–10.3)
Chloride: 105 mmol/L (ref 98–111)
Creatinine: 0.8 mg/dL (ref 0.44–1.00)
GFR, Estimated: >60 mL/min (ref >60)
Glucose, Bld: 101 mg/dL — ABNORMAL HIGH (ref 70–99)
Potassium: 3.6 mmol/L (ref 3.5–5.1)
Sodium: 139 mmol/L (ref 135–145)
Total Bilirubin: 0.3 mg/dL (ref 0.0–1.2)
Total Protein: 6.9 g/dL (ref 6.5–8.1)

## 2023-09-12 LAB — CBC WITH DIFFERENTIAL/PLATELET
Abs Immature Granulocytes: 0.05 10*3/uL (ref 0.00–0.07)
Basophils Absolute: 0.1 10*3/uL (ref 0.0–0.1)
Basophils Relative: 1 %
Eosinophils Absolute: 0.4 10*3/uL (ref 0.0–0.5)
Eosinophils Relative: 7 %
HCT: 27.5 % — ABNORMAL LOW (ref 36.0–46.0)
Hemoglobin: 8.8 g/dL — ABNORMAL LOW (ref 12.0–15.0)
Immature Granulocytes: 1 %
Lymphocytes Relative: 27 %
Lymphs Abs: 1.6 10*3/uL (ref 0.7–4.0)
MCH: 27.2 pg (ref 26.0–34.0)
MCHC: 32 g/dL (ref 30.0–36.0)
MCV: 84.9 fL (ref 80.0–100.0)
Monocytes Absolute: 0.6 10*3/uL (ref 0.1–1.0)
Monocytes Relative: 9 %
Neutro Abs: 3.2 10*3/uL (ref 1.7–7.7)
Neutrophils Relative %: 55 %
Platelets: 266 10*3/uL (ref 150–400)
RBC: 3.24 MIL/uL — ABNORMAL LOW (ref 3.87–5.11)
RDW: 26.5 % — ABNORMAL HIGH (ref 11.5–15.5)
WBC: 5.9 10*3/uL (ref 4.0–10.5)
nRBC: 0.3 % — ABNORMAL HIGH (ref 0.0–0.2)

## 2023-09-12 LAB — TSH: TSH: 0.626 u[IU]/mL (ref 0.350–4.500)

## 2023-09-13 ENCOUNTER — Ambulatory Visit

## 2023-09-13 DIAGNOSIS — Z483 Aftercare following surgery for neoplasm: Secondary | ICD-10-CM | POA: Diagnosis not present

## 2023-09-13 DIAGNOSIS — R293 Abnormal posture: Secondary | ICD-10-CM

## 2023-09-13 DIAGNOSIS — Z9189 Other specified personal risk factors, not elsewhere classified: Secondary | ICD-10-CM

## 2023-09-13 DIAGNOSIS — C50412 Malignant neoplasm of upper-outer quadrant of left female breast: Secondary | ICD-10-CM

## 2023-09-13 DIAGNOSIS — R600 Localized edema: Secondary | ICD-10-CM

## 2023-09-13 DIAGNOSIS — L7682 Other postprocedural complications of skin and subcutaneous tissue: Secondary | ICD-10-CM

## 2023-09-13 NOTE — Therapy (Signed)
 OUTPATIENT PHYSICAL THERAPY  UPPER EXTREMITY ONCOLOGY  Patient Name: Nicole Maddox MRN: [id number] DOB:08-Apr-1981, 43 y.o., female Today's Date: 09/13/2023  END OF SESSION:  PT End of Session - 09/13/23 1103     Visit Number 5    Number of Visits 12    Date for PT Re-Evaluation 10/10/23    Authorization Type none required but max 26/  used 5    Authorization - Visit Number 5    Authorization - Number of Visits 21    PT Start Time 1103    PT Stop Time 1157    PT Time Calculation (min) 54 min    Activity Tolerance Patient tolerated treatment well    Behavior During Therapy WFL for tasks assessed/performed               Past Medical History:  Diagnosis Date   Breast cancer (HCC) 06/23/2022   Family history of adverse reaction to anesthesia    mother with h/o slow to wake   Family history of stomach cancer    GERD (gastroesophageal reflux disease)    Hyperthyroidism    due to Graves' disease   Multinodular thyroid    Personal history of chemotherapy    Personal history of radiation therapy    Thyroid disease    Past Surgical History:  Procedure Laterality Date   BREAST BIOPSY Left 06/23/2022   [place] LT BREAST BX W LOC DEV 1ST LESION IMG BX SPEC US GUIDE 06/23/2022 [facility]   BREAST BIOPSY Left 01/02/2023   [place] LT RADIOACTIVE SEED LOC 01/02/2023 [facility]   BREAST BIOPSY  01/02/2023   MM RT RADIOACTIVE SEED LOC MAMMO GUIDE 01/02/2023 [facility]   BREAST LUMPECTOMY Left 01/04/2023   BREAST LUMPECTOMY WITH RADIOACTIVE SEED AND SENTINEL LYMPH NODE BIOPSY Left 01/04/2023   Procedure: LEFT BREAST LUMPECTOMY WITH RADIOACTIVE SEED AND SENTINEL LYMPH NODE BIOPSY;  Surgeon: Griselda Miner, MD;  Location: [facility] OR;  Service: General;  Laterality: Left;  PEC BLOCK   BREAST LUMPECTOMY WITH RADIOACTIVE SEED LOCALIZATION Right 01/04/2023   Procedure: RIGHT BREAST LUMPECTOMY WITH RADIOACTIVE SEED LOCALIZATION;  Surgeon: Griselda Miner, MD;  Location: [facility] OR;   Service: General;  Laterality: Right;   BREAST REDUCTION WITH MASTOPEXY Bilateral 01/04/2023   Procedure: BREAST REDUCTION WITH MASTOPEXY;  Surgeon: Peggye Form, DO;  Location: [facility] OR;  Service: Plastics;  Laterality: Bilateral;   PORT-A-CATH REMOVAL N/A 03/01/2023   Procedure: REMOVAL PORT-A-CATH;  Surgeon: Griselda Miner, MD;  Location: [facility];  Service: General;  Laterality: N/A;   PORTACATH PLACEMENT Right 07/19/2022   Procedure: INSERTION PORT-A-CATH;  Surgeon: Griselda Miner, MD;  Location: [facility];  Service: General;  Laterality: Right;  60 MIN ROOM 8   WISDOM TOOTH EXTRACTION     Patient Active Problem List   Diagnosis Date Noted   Elevated cholesterol 08/23/2023   Vitamin D deficiency 08/08/2023   Port-A-Cath in place 07/20/2022   Genetic testing 07/07/2022   Family history of stomach cancer 06/29/2022   Malignant neoplasm of upper-outer quadrant of left female breast (HCC) 06/27/2022   Seborrheic dermatitis of scalp 02/25/2018    PCP:   REFERRING PROVIDER: Rachel Moulds, MD  REFERRING DIAG: Left UE swelling/tenderness s/p breast Cancer  THERAPY DIAG:  Aftercare following surgery for neoplasm  Abnormal posture  Malignant neoplasm of upper-outer quadrant of left female breast, unspecified estrogen receptor status (HCC)  Axillary web syndrome  At risk for lymphedema  Localized  edema  ONSET DATE: 08/24/2023  Rationale for Evaluation and Treatment: Rehabilitation  SUBJECTIVE:                                                                                                                                                                                           SUBJECTIVE STATEMENT:  Cording is better but it is still mostly at my wrist/thumb. I think my motion is doing really well.   EVAL: My left arm is sore to touch from the upper arm to the forearm and I feel like it is swollen. I tend to hold my arm across the waist  because it feels better. I get occasional shooting pains in the breast that is not new. They called in an antibiotic for me but I have not picked it up yet.  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 Completed chemotherapy. On 01/04/2023 pt had a Lt lumpectomy with 6 negative nodes. Completed radiation 03/29/23. On Capecitabine (Xeloda) chemo. Pt notices swelling in bilateral hands with walking which resolves  PAIN:  Are you having pain? Yes NPRS scale: 0/10 Pain location:   Pain orientation: Left  PAIN TYPE: aching, tight, and constant Pain description: constant  Aggravating factors: movement, light touch Relieving factors: not moving  PRECAUTIONS: Lt lymphedema risk   RED FLAGS: None   WEIGHT BEARING RESTRICTIONS: No  FALLS:  Has patient fallen in last 6 months? No  LIVING ENVIRONMENT: Lives with: lives alone, sometimes goes to see her mom in [place], [place] Lives in: House/apartment  OCCUPATION: On leave  LEISURE: reading,  walking  HAND DOMINANCE: right   PRIOR LEVEL OF FUNCTION: Independent  PATIENT GOALS: get pain better, check swelling   OBJECTIVE: Note: Objective measures were completed at Evaluation unless otherwise noted.  COGNITION: Overall cognitive status: Within functional limits for tasks assessed   PALPATION: Tender throughout left forearm and upper arm, multiple cords palpable but not visible  OBSERVATIONS / OTHER ASSESSMENTS: pt very guarded/anxious and wants to hold arm across waist  SENSATION: Light touch: Deficits      POSTURE: forward head, rounded shoulders  UPPER EXTREMITY AROM/PROM:  A/PROM RIGHT   eval   Shoulder extension 51  Shoulder flexion 147  Shoulder abduction 175  Shoulder internal rotation   Shoulder external rotation     (Blank rows = not tested)  A/PROM LEFT   eval LEFT 09/13/2023  Shoulder extension 33 55  Shoulder flexion 122 161  Shoulder abduction 90 137  Shoulder internal rotation    Shoulder external  rotation      (Blank rows = not tested)  CERVICAL AROM: All within normal  limits:    UPPER EXTREMITY STRENGTH:   LYMPHEDEMA ASSESSMENTS:  SURGERY TYPE/DATE: 01/04/2023 Left lumpectomy NUMBER OF LYMPH NODES REMOVED: 0/6 CHEMOTHERAPY: YES RADIATION:YES HORMONE TREATMENT: NO INFECTIONS: NO  LYMPHEDEMA ASSESSMENTS:   LANDMARK RIGHT  eval  At axilla  33.6  15 cm proximal to olecranon process   10 cm proximal to olecranon process 31.9  Olecranon process 27.7  15 cm proximal to ulnar styloid process   10 cm proximal to ulnar styloid process 24.05  Just proximal to ulnar styloid process 15.5  Across hand at thumb web space 19.35  At base of 2nd digit 5.65  (Blank rows = not tested)  LANDMARK LEFT  eval  At axilla  32.9  15 cm proximal to olecranon process   10 cm proximal to olecranon process 32.3  Olecranon process 27.4  15 cm proximal to ulnar styloid process   10 cm proximal to ulnar styloid process 23.7  Just proximal to ulnar styloid process 15.2  Across hand at thumb web space 18.8  At base of 2nd digit 5.7  (Blank rows = not tested)   FUNCTIONAL TESTS:    GAIT:WNL  QUICK DASH SURVEY: 39%                                                                                                                            TREATMENT DATE:  09/13/2023 THEREx Pulleys into flexion and abd for 2:30 sec each Ball rolls flexion x 10, abd on left x 5 Half foam roll: 3 D AROM flexion, scaption, horizontal abd x 5 , snow angels x 5 ea, 10 sec hold LTR B x 3 15 sec hold MANUAL MFR longitudinal stretch and "S"with arm at 90 degrees and 120 degrees on pillow to decrease cording PROM left shoulder flexion, abd, D2 flex Measured left shoulder AROM 09/10/2023 Pulleys into flexion and abd for 2:30 sec each Ball rolls flexion x 10, abd on left x 5 Supine over half foam roll: prolong chest stretch 2x60", snow angel x 8, bil horizontal abduction, bil Y motion x 8, alternating flexion x  6 MANUAL MFR longitudinal stretch and "S"with arm at 90 degrees and 120 degrees on pillow to decrease cording PROM left shoulder flexion, abd, D2 flex  09/06/2023 Pulleys into flexion and abd for 2:30 sec each Ball rolls flexion x 10, abd on left x 5  Single arm chest stretch at wall 3 x 20 MANUAL MFR longitudinal stretch and "S"with arm at 90 degrees and 120 degrees on pillow to decrease cording Cupping to upper arm antecubital fossa ad forearm areas of cording and  with smallest cup left lateral thumb PROM left shoulder flexion, abd, D2 flex  09/03/23 Therapeutic Exercises Seated pulleys into flexion and abduction x each with reminder of movements Ball flexion 5" x 5  Single arm Left arm chest stretch on wall - feeling pull without much trunk rotation Manual Therapy  Lt arm cording release with arm at  around 90deg of abduction on pillow and during PROM into flexion and D2 positions but not held long Opposing force pressure, S pressure, from axilla to wrist Discussed cording throughout answering all questions.   08/29/2023 Printed axillary web info for pt.. Instructed in and had pt perform 4 post op exercises x 5 reps, and will do 2x's per day. Also educated in wrist extension with arm in various positions where she feels tightness to loosen cords. Educated in POC, LOS, and treatment interventions to decrease pain/cording. Measured circumference and performed SOZO to alleviate fear of lymphedema. Also advised she does not require antibiotics for this    PATIENT EDUCATION:  Education details: educated in cording and the need to gently stretch to improve, avoid guarding, 4 post op exercises to start to improve ROM, no lymphedema or infection Person educated: Patient Education method: Programmer, multimedia, Demonstration, and Handouts Education comprehension: verbalized understanding and returned demonstration  HOME EXERCISE PROGRAM: 4 post op exercises, wrist ext with arm in various  positions to feel stretch  ASSESSMENT:  CLINICAL IMPRESSION: Cording much better overall and AROM with excellent improvement. Abduction most limited due to pulling from cording.  OBJECTIVE IMPAIRMENTS: decreased activity tolerance, decreased knowledge of condition, decreased ROM, increased fascial restrictions, impaired UE functional use, postural dysfunction, and pain.   ACTIVITY LIMITATIONS: lifting and reach over head  PARTICIPATION LIMITATIONS: cleaning and yard work  PERSONAL FACTORS: 3+ comorbidities: Left  triple negative breast cancer s/p chemo and radiation  are also affecting patient's functional outcome.   REHAB POTENTIAL: Good  CLINICAL DECISION MAKING: Evolving/moderate complexity  EVALUATION COMPLEXITY: Moderate  GOALS: Goals reviewed with patient? Yes  SHORT TERM GOALS: Target date: 09/19/2023  Pt will be independent in HEP for stretching and strengthening of left UE Baseline: Goal status: INITIAL  2.  Pt will report pain/tenderness from cording improved by 25% Baseline:  Goal status: MET 09/06/2023 3.  Left shoulder ROM will be improved by 20 degrees fro shoulder flexion and 40 degrees for Abd for improved reaching Baseline: 122 flex, 90 abd Goal status: INITIAL    LONG TERM GOALS: Target date: 10/10/2023  Pain will be improved by atleast 50% for improved function Baseline:  Goal status: MET 09/06/2023 2.  Left shoulder ROM will be within 10 degrees of Right shoulder ROM for improved reaching Baseline:  Goal status: INITIAL  3.  Quick dash will be no greater than 15% to demonstrate improved function in Left UE Baseline:  Goal status: INITIAL  PLAN:  PT FREQUENCY: 2x/week  PT DURATION: 6 weeks  PLANNED INTERVENTIONS: 97164- PT Re-evaluation, 97110-Therapeutic exercises, 97530- Therapeutic activity, 97112- Neuromuscular re-education, 97535- Self Care, 91478- Manual therapy, and 97760- Orthotic Fit/training, Dry Needling  PLAN FOR NEXT SESSION: Review  HEP, pulleys, wand, MFR techniques to cording, cupping/IASTM prn for cording, progress to strength when ready  Waynette Buttery, PT 09/13/2023, 11:57 AM

## 2023-09-17 ENCOUNTER — Ambulatory Visit: Admitting: Rehabilitation

## 2023-09-17 ENCOUNTER — Encounter: Payer: Self-pay | Admitting: Rehabilitation

## 2023-09-17 ENCOUNTER — Ambulatory Visit: Payer: Managed Care, Other (non HMO)

## 2023-09-17 DIAGNOSIS — Z483 Aftercare following surgery for neoplasm: Secondary | ICD-10-CM | POA: Diagnosis not present

## 2023-09-17 DIAGNOSIS — C50412 Malignant neoplasm of upper-outer quadrant of left female breast: Secondary | ICD-10-CM

## 2023-09-17 DIAGNOSIS — R293 Abnormal posture: Secondary | ICD-10-CM

## 2023-09-17 DIAGNOSIS — Z9189 Other specified personal risk factors, not elsewhere classified: Secondary | ICD-10-CM

## 2023-09-17 DIAGNOSIS — L905 Scar conditions and fibrosis of skin: Secondary | ICD-10-CM

## 2023-09-17 NOTE — Therapy (Signed)
 OUTPATIENT PHYSICAL THERAPY  UPPER EXTREMITY ONCOLOGY  Patient Name: Nicole Maddox MRN: [id number] DOB:1980/08/24, 43 y.o., female Today's Date: 09/17/2023  END OF SESSION:  PT End of Session - 09/17/23 1357     Visit Number 6    Number of Visits 12    Date for PT Re-Evaluation 10/10/23    Authorization - Visit Number 6    Authorization - Number of Visits 21    PT Start Time 1204    PT Stop Time 1256    PT Time Calculation (min) 52 min    Activity Tolerance Patient tolerated treatment well    Behavior During Therapy WFL for tasks assessed/performed                Past Medical History:  Diagnosis Date   Breast cancer (HCC) 06/23/2022   Family history of adverse reaction to anesthesia    mother with h/o slow to wake   Family history of stomach cancer    GERD (gastroesophageal reflux disease)    Hyperthyroidism    due to Graves' disease   Multinodular thyroid    Personal history of chemotherapy    Personal history of radiation therapy    Thyroid disease    Past Surgical History:  Procedure Laterality Date   BREAST BIOPSY Left 06/23/2022   [place] LT BREAST BX W LOC DEV 1ST LESION IMG BX SPEC US GUIDE 06/23/2022 [facility]   BREAST BIOPSY Left 01/02/2023   [place] LT RADIOACTIVE SEED LOC 01/02/2023 [facility]   BREAST BIOPSY  01/02/2023   MM RT RADIOACTIVE SEED LOC MAMMO GUIDE 01/02/2023 [facility]   BREAST LUMPECTOMY Left 01/04/2023   BREAST LUMPECTOMY WITH RADIOACTIVE SEED AND SENTINEL LYMPH NODE BIOPSY Left 01/04/2023   Procedure: LEFT BREAST LUMPECTOMY WITH RADIOACTIVE SEED AND SENTINEL LYMPH NODE BIOPSY;  Surgeon: Griselda Miner, MD;  Location: [facility] OR;  Service: General;  Laterality: Left;  PEC BLOCK   BREAST LUMPECTOMY WITH RADIOACTIVE SEED LOCALIZATION Right 01/04/2023   Procedure: RIGHT BREAST LUMPECTOMY WITH RADIOACTIVE SEED LOCALIZATION;  Surgeon: Griselda Miner, MD;  Location: [facility] OR;  Service: General;  Laterality: Right;   BREAST REDUCTION  WITH MASTOPEXY Bilateral 01/04/2023   Procedure: BREAST REDUCTION WITH MASTOPEXY;  Surgeon: Peggye Form, DO;  Location: [facility] OR;  Service: Plastics;  Laterality: Bilateral;   PORT-A-CATH REMOVAL N/A 03/01/2023   Procedure: REMOVAL PORT-A-CATH;  Surgeon: Griselda Miner, MD;  Location: [facility];  Service: General;  Laterality: N/A;   PORTACATH PLACEMENT Right 07/19/2022   Procedure: INSERTION PORT-A-CATH;  Surgeon: Griselda Miner, MD;  Location: [facility];  Service: General;  Laterality: Right;  60 MIN ROOM 8   WISDOM TOOTH EXTRACTION     Patient Active Problem List   Diagnosis Date Noted   Elevated cholesterol 08/23/2023   Vitamin D deficiency 08/08/2023   Port-A-Cath in place 07/20/2022   Genetic testing 07/07/2022   Family history of stomach cancer 06/29/2022   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    EVAL: My left arm is sore to touch from the upper arm to the forearm and I feel like it is swollen. I tend to hold my arm across the waist because it feels better. I get occasional shooting pains in the breast that is not new. They called in an antibiotic for me but I have not picked it up  yet.  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 Completed chemotherapy. On 01/04/2023 pt had a Lt lumpectomy with 6 negative nodes. Completed radiation 03/29/23. On Capecitabine (Xeloda) chemo. Pt notices swelling in bilateral hands with walking which resolves  PAIN:  Are you having pain? Yes NPRS scale: 0/10 Pain location:   Pain orientation: Left  PAIN TYPE: aching, tight, and constant Pain description: constant  Aggravating factors: movement, light touch Relieving factors: not moving  PRECAUTIONS: Lt lymphedema risk   RED FLAGS: None   WEIGHT BEARING RESTRICTIONS: No  FALLS:  Has patient fallen in last 6 months? No  LIVING ENVIRONMENT: Lives with: lives alone, sometimes goes to see her mom in [place], [place] Lives in: House/apartment  OCCUPATION: On leave  LEISURE: reading,  walking  HAND DOMINANCE: right   PRIOR LEVEL OF FUNCTION: Independent  PATIENT GOALS: get pain better, check swelling   OBJECTIVE: Note: Objective measures were completed at Evaluation unless otherwise noted.  COGNITION: Overall cognitive status: Within functional limits for tasks assessed   PALPATION: Tender throughout left forearm and upper arm, multiple cords palpable but not visible  OBSERVATIONS / OTHER ASSESSMENTS: pt very guarded/anxious and wants to hold arm across waist  SENSATION: Light touch: Deficits      POSTURE: forward head, rounded shoulders  UPPER EXTREMITY AROM/PROM:  A/PROM RIGHT   eval   Shoulder extension 51  Shoulder flexion 147  Shoulder abduction 175  Shoulder internal rotation   Shoulder external rotation     (Blank rows = not tested)  A/PROM LEFT   eval LEFT 09/13/2023 09/17/23  Shoulder extension 33 55 55  Shoulder flexion 122 161 161  Shoulder abduction 90 137 175  Shoulder internal rotation     Shoulder external rotation       (Blank rows = not tested)  CERVICAL AROM: All within normal limits:    UPPER EXTREMITY STRENGTH:    LYMPHEDEMA ASSESSMENTS:  SURGERY TYPE/DATE: 01/04/2023 Left lumpectomy NUMBER OF LYMPH NODES REMOVED: 0/6 CHEMOTHERAPY: YES RADIATION:YES HORMONE TREATMENT: NO INFECTIONS: NO  LYMPHEDEMA ASSESSMENTS:   LANDMARK RIGHT  eval  At axilla  33.6  15 cm proximal to olecranon process   10 cm proximal to olecranon process 31.9  Olecranon process 27.7  15 cm proximal to ulnar styloid process   10 cm proximal to ulnar styloid process 24.05  Just proximal to ulnar styloid process 15.5  Across hand at thumb web space 19.35  At base of 2nd digit 5.65  (Blank rows = not tested)  LANDMARK LEFT  eval  At axilla  32.9  15 cm proximal to olecranon process   10 cm proximal to olecranon process 32.3  Olecranon process 27.4  15 cm proximal to ulnar styloid process   10 cm proximal to ulnar styloid process 23.7  Just proximal to ulnar styloid process 15.2  Across hand at thumb web space 18.8  At base of 2nd digit 5.7  (Blank rows = not tested)   FUNCTIONAL TESTS:    GAIT:WNL  QUICK DASH SURVEY: 39%  TREATMENT DATE:  09/17/2023 THEREx Pulleys into flexion and abd for 2:30 sec each (easy) Ball rolls flexion x 5, abd on left x 5 Half foam roll: AROM flexion, horizontal abd x 5 , snow angels x 10 ea LTR B x 3 15 sec hold MANUAL MFR longitudinal stretch and "S"with arm at 90 degrees and 120 degrees on pillow to decrease cording PROM left shoulder flexion, abd, D2 flex Measured left shoulder AROM  09/13/2023 THEREx Pulleys into flexion and abd for 2:30 sec each Ball rolls flexion x 10, abd on left x 5 Half foam roll: 3 D AROM flexion, scaption, horizontal abd x 5 , snow angels x 5 ea, 10 sec hold LTR B x 3 15 sec hold MANUAL MFR longitudinal stretch and "S"with arm at 90 degrees and 120 degrees on pillow to decrease cording PROM left shoulder flexion, abd, D2  flex Measured left shoulder AROM  09/10/2023 Pulleys into flexion and abd for 2:30 sec each Ball rolls flexion x 10, abd on left x 5 Supine over half foam roll: prolong chest stretch 2x60", snow angel x 8, bil horizontal abduction, bil Y motion x 8, alternating flexion x 6 MANUAL MFR longitudinal stretch and "S"with arm at 90 degrees and 120 degrees on pillow to decrease cording PROM left shoulder flexion, abd, D2 flex   PATIENT EDUCATION:  Education details: educated in cording and the need to gently stretch to improve, avoid guarding, 4 post op exercises to start to improve ROM, no lymphedema or infection Person educated: Patient Education method: Programmer, multimedia, Demonstration, and Handouts Education comprehension: verbalized understanding and returned demonstration  HOME EXERCISE PROGRAM: 4 post op exercises, wrist ext with arm in various positions to feel stretch  ASSESSMENT:  CLINICAL IMPRESSION: Almost all goals now met.  Pt continues with a small amout of discomfort and pull at the Left wrist region but only intermittently.  Will decrease to 1x per week until end of POC>   OBJECTIVE IMPAIRMENTS: decreased activity tolerance, decreased knowledge of condition, decreased ROM, increased fascial restrictions, impaired UE functional use, postural dysfunction, and pain.   ACTIVITY LIMITATIONS: lifting and reach over head  PARTICIPATION LIMITATIONS: cleaning and yard work  PERSONAL FACTORS: 3+ comorbidities: Left  triple negative breast cancer s/p chemo and radiation  are also affecting patient's functional outcome.   REHAB POTENTIAL: Good  CLINICAL DECISION MAKING: Evolving/moderate complexity  EVALUATION COMPLEXITY: Moderate  GOALS: Goals reviewed with patient? Yes  SHORT TERM GOALS: Target date: 09/19/2023  Pt will be independent in HEP for stretching and strengthening of left UE Baseline: Goal status: MET  2.  Pt will report pain/tenderness from cording improved by  25% Baseline:  Goal status: MET 09/06/2023  3.  Left shoulder ROM will be improved by 20 degrees fro shoulder flexion and 40 degrees for Abd for improved reaching Baseline: 122 flex, 90 abd Goal status: MET 09/17/23    LONG TERM GOALS: Target date: 10/10/2023  Pain will be improved by atleast 50% for improved function Baseline:  Goal status: MET 09/06/2023  2.  Left shoulder ROM will be within 10 degrees of Right shoulder ROM for improved reaching Baseline:  Goal status: MET  3.  Quick dash will be no greater than 15% to demonstrate improved function in Left UE Baseline:  Goal status: INITIAL  PLAN:  PT FREQUENCY: 2x/week  PT DURATION: 6 weeks  PLANNED INTERVENTIONS: 97164- PT Re-evaluation, 97110-Therapeutic exercises, 97530- Therapeutic activity, 97112- Neuromuscular re-education, 97535- Self Care, 09811- Manual therapy, and 97760- Orthotic  Fit/training, Dry Needling  PLAN FOR NEXT SESSION: Review HEP, pulleys, wand, MFR techniques to cording, cupping/IASTM prn for cording, progress to strength when ready  Encarnacion Harris, PT 09/17/2023, 1:58 PM

## 2023-09-19 ENCOUNTER — Encounter: Payer: Self-pay | Admitting: Bariatrics

## 2023-09-19 ENCOUNTER — Ambulatory Visit (INDEPENDENT_AMBULATORY_CARE_PROVIDER_SITE_OTHER): Admitting: Bariatrics

## 2023-09-19 VITALS — BP 110/72 | HR 97 | Temp 97.4°F | Ht 64.0 in | Wt 188.0 lb

## 2023-09-19 DIAGNOSIS — E669 Obesity, unspecified: Secondary | ICD-10-CM

## 2023-09-19 DIAGNOSIS — Z6832 Body mass index (BMI) 32.0-32.9, adult: Secondary | ICD-10-CM | POA: Diagnosis not present

## 2023-09-19 DIAGNOSIS — E88819 Insulin resistance, unspecified: Secondary | ICD-10-CM

## 2023-09-19 DIAGNOSIS — E559 Vitamin D deficiency, unspecified: Secondary | ICD-10-CM

## 2023-09-19 DIAGNOSIS — E66811 Obesity, class 1: Secondary | ICD-10-CM

## 2023-09-19 MED ORDER — VITAMIN D (ERGOCALCIFEROL) 1.25 MG (50000 UNIT) PO CAPS: 50000.0000 [IU] | ORAL_CAPSULE | ORAL | 0 refills | Status: DC

## 2023-09-19 NOTE — Progress Notes (Signed)
 WEIGHT SUMMARY AND BIOMETRICS  Weight Lost Since Last Visit: 0  Weight Gained Since Last Visit: 1lb   Vitals Temp: (!) 97.4 F (36.3 C) BP: 110/72 Pulse Rate: 97 SpO2: 96 %   Anthropometric Measurements Height: 5\' 4"  (1.626 m) Weight: 188 lb (85.3 kg) BMI (Calculated): 32.25 Weight at Last Visit: 187lb Weight Lost Since Last Visit: 0 Weight Gained Since Last Visit: 1lb Starting Weight: 187lb Total Weight Loss (lbs): 0 lb (0 kg)   Body Composition  Body Fat %: 38.3 % Fat Mass (lbs): 72 lbs Muscle Mass (lbs): 110.2 lbs Total Body Water (lbs): 74.4 lbs Visceral Fat Rating : 8   Other Clinical Data Fasting: no Labs: no Today's Visit #: 3 Starting Date: 08/22/23    OBESITY Shakerria is here to discuss her progress with her obesity treatment plan along with follow-up of her obesity related diagnoses.    Nutrition Plan: the Category 2 plan - 50% adherence.  Current exercise: walking  Interim History:  She is up 1 pound since her last visit Eating all of the food on the plan., Protein intake is as prescribed, Is not skipping meals, Not journaling consistently., and Denies polyphagia   Pharmacotherapy: Maxyne is on Metformin 500 mg once daily breakfast she has metformin at home from a previous prescription that she will resume.  She will always take her metformin with food.  Adverse side effects: None Hunger is moderately controlled. Fluctuating.  Cravings are well controlled.  Assessment/Plan:   Vitamin D Deficiency Vitamin D is not at goal of 50.  Most recent vitamin D level was 20.3. She is on  prescription ergocalciferol 50,000 IU weekly. Lab Results  Component Value Date   VD25OH 20.3 (L) 08/22/2023    Plan: Refill prescription vitamin D 50,000 IU weekly.   Insulin Resistance Jennice has had elevated fasting insulin readings. Goal  is HgbA1c < 5.7, fasting insulin at l0 or less, and preferably at 5.  She  denies polyphagia. Medication(s): Has a previous prescription for metformin 500 mg Lab Results  Component Value Date   HGBA1C 5.3 08/22/2023   Lab Results  Component Value Date   INSULIN 14.0 08/22/2023    Plan Medication(s): Metformin 500 mg once daily breakfast Will work on the agreed upon plan. Will minimize refined carbohydrates ( sweets and starches), and focus more on complex carbohydrates.  Increase the micronutrients found in leafy greens, which include magnesium, polyphenols, and vitamin C which have been postulated to help with insulin sensitivity. Minimize "fast food" and cook more meals at home.  Increase fiber to 25 to 30 grams daily.  Will begin to journal with a calorie limit of 1200 cal and protein at 80 to 90 g daily. She will start using My Fitness Pal on a regular basis. Journaling sheet was given and explained and she will start using it on a regular  basis.   Generalized Obesity: Current BMI BMI (Calculated): 32.25   Pharmacotherapy Plan Start  Metformin 500 mg once daily breakfast.  She has medication at home that she will resume and then I will continue with the prescription in the future.   Zell is currently in the action stage of change. As such, her goal is to continue with weight loss efforts.  She has agreed to the Category 2 plan.  Exercise goals: All adults should avoid inactivity. Some physical activity is better than none, and adults who participate in any amount of physical activity gain some health benefits.  Behavioral modification strategies: increasing lean protein intake, no meal skipping, meal planning , increase water intake, better snacking choices, planning for success, increasing vegetables, avoiding temptations, and keep healthy foods in the home.  Ruey has agreed to follow-up with our clinic in 2 weeks.   Objective:   VITALS: Per patient if applicable,  see vitals. GENERAL: Alert and in no acute distress. CARDIOPULMONARY: No increased WOB. Speaking in clear sentences.  PSYCH: Pleasant and cooperative. Speech normal rate and rhythm. Affect is appropriate. Insight and judgement are appropriate. Attention is focused, linear, and appropriate.  NEURO: Oriented as arrived to appointment on time with no prompting.   Attestation Statements:   This was prepared with the assistance of Engineer, civil (consulting).  Occasional wrong-word or sound-a-like substitutions may have occurred due to the inherent limitations of voice recognition

## 2023-09-20 ENCOUNTER — Encounter: Admitting: Rehabilitation

## 2023-09-24 ENCOUNTER — Encounter: Payer: Self-pay | Admitting: Rehabilitation

## 2023-09-24 ENCOUNTER — Ambulatory Visit: Admitting: Rehabilitation

## 2023-09-24 DIAGNOSIS — R293 Abnormal posture: Secondary | ICD-10-CM

## 2023-09-24 DIAGNOSIS — Z9189 Other specified personal risk factors, not elsewhere classified: Secondary | ICD-10-CM

## 2023-09-24 DIAGNOSIS — Z483 Aftercare following surgery for neoplasm: Secondary | ICD-10-CM

## 2023-09-24 DIAGNOSIS — C50412 Malignant neoplasm of upper-outer quadrant of left female breast: Secondary | ICD-10-CM

## 2023-09-24 DIAGNOSIS — L7682 Other postprocedural complications of skin and subcutaneous tissue: Secondary | ICD-10-CM

## 2023-09-24 NOTE — Therapy (Signed)
 OUTPATIENT PHYSICAL THERAPY  UPPER EXTREMITY ONCOLOGY  Patient Name: Nicole Maddox MRN: [id number] DOB:1981/04/10, 43 y.o., female Today's Date: 09/24/2023  END OF SESSION:  PT End of Session - 09/24/23 2233     Visit Number 7    Number of Visits 12    Date for PT Re-Evaluation 10/10/23    PT Start Time 1211    PT Stop Time 1256    PT Time Calculation (min) 45 min    Activity Tolerance Patient tolerated treatment well    Behavior During Therapy [facility] for tasks assessed/performed                 Past Medical History:  Diagnosis Date   Breast cancer (HCC) 06/23/2022   Family history of adverse reaction to anesthesia    mother with h/o slow to wake   Family history of stomach cancer    GERD (gastroesophageal reflux disease)    Hyperthyroidism    due to Graves' disease   Multinodular thyroid     Personal history of chemotherapy    Personal history of radiation therapy    Thyroid  disease    Past Surgical History:  Procedure Laterality Date   BREAST BIOPSY Left 06/23/2022   US  LT BREAST BX W LOC DEV 1ST LESION IMG BX SPEC US  GUIDE 06/23/2022 [facility]   BREAST BIOPSY Left 01/02/2023   US  LT RADIOACTIVE SEED LOC 01/02/2023 [facility]   BREAST BIOPSY  01/02/2023   MM RT RADIOACTIVE SEED LOC MAMMO GUIDE 01/02/2023 [facility]   BREAST LUMPECTOMY Left 01/04/2023   BREAST LUMPECTOMY WITH RADIOACTIVE SEED AND SENTINEL LYMPH NODE BIOPSY Left 01/04/2023   Procedure: LEFT BREAST LUMPECTOMY WITH RADIOACTIVE SEED AND SENTINEL LYMPH NODE BIOPSY;  Surgeon: Caralyn Chandler, MD;  Location: [facility];  Service: General;  Laterality: Left;  PEC BLOCK   BREAST LUMPECTOMY WITH RADIOACTIVE SEED LOCALIZATION Right 01/04/2023   Procedure: RIGHT BREAST LUMPECTOMY WITH RADIOACTIVE SEED LOCALIZATION;  Surgeon: Caralyn Chandler, MD;  Location: [facility] OR;  Service: General;  Laterality: Right;   BREAST REDUCTION WITH MASTOPEXY Bilateral 01/04/2023   Procedure: BREAST REDUCTION WITH  MASTOPEXY;  Surgeon: Thornell Flirt, DO;  Location: [facility] OR;  Service: Plastics;  Laterality: Bilateral;   PORT-A-CATH REMOVAL N/A 03/01/2023   Procedure: REMOVAL PORT-A-CATH;  Surgeon: Caralyn Chandler, MD;  Location: [facility];  Service: General;  Laterality: N/A;   PORTACATH PLACEMENT Right 07/19/2022   Procedure: INSERTION PORT-A-CATH;  Surgeon: Caralyn Chandler, MD;  Location: [facility];  Service: General;  Laterality: Right;  60 MIN ROOM 8   WISDOM TOOTH EXTRACTION     Patient Active Problem List   Diagnosis Date Noted   Elevated cholesterol 08/23/2023   Vitamin D  deficiency 08/08/2023   Port-A-Cath in place 07/20/2022   Genetic testing 07/07/2022   Family history of stomach cancer 06/29/2022   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    EVAL: My left arm is sore to touch from the upper arm to the forearm and I feel like it is swollen. I tend to hold my arm across the waist because it feels better. I get occasional shooting pains in the breast that is not new. They called in an antibiotic for me but I have not picked it up yet.  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 Completed  chemotherapy. On 01/04/2023 pt had a Lt lumpectomy with 6 negative nodes. Completed radiation 03/29/23. On Capecitabine  (Xeloda ) chemo. Pt notices swelling in bilateral hands with walking which resolves  PAIN:  Are you having pain? Yes NPRS scale: 0/10 Pain location:   Pain orientation: Left  PAIN TYPE: aching, tight, and constant Pain description: constant  Aggravating factors: movement, light touch Relieving factors: not moving  PRECAUTIONS: Lt lymphedema risk   RED FLAGS: None   WEIGHT BEARING RESTRICTIONS: No  FALLS:  Has patient fallen in last 6 months? No  LIVING ENVIRONMENT: Lives with: lives alone, sometimes goes to see her mom in [place], [place] Lives in: House/apartment  OCCUPATION: On leave  LEISURE: reading,  walking  HAND DOMINANCE: right   PRIOR LEVEL OF FUNCTION: Independent  PATIENT GOALS: get pain better, check swelling   OBJECTIVE: Note: Objective measures were completed at Evaluation unless otherwise noted.  COGNITION: Overall cognitive status: Within functional limits for tasks assessed   PALPATION: Tender throughout left forearm and upper arm, multiple cords palpable but not visible  OBSERVATIONS / OTHER ASSESSMENTS: pt very guarded/anxious and wants to hold arm across waist  SENSATION: Light touch: Deficits      POSTURE: forward head, rounded shoulders  UPPER EXTREMITY AROM/PROM:  A/PROM RIGHT   eval   Shoulder extension 51  Shoulder flexion 147  Shoulder abduction 175  Shoulder internal rotation   Shoulder external rotation     (Blank rows = not tested)  A/PROM LEFT   eval LEFT 09/13/2023 09/17/23  Shoulder extension 33 55 55  Shoulder flexion 122 161 161  Shoulder abduction 90 137 175  Shoulder internal rotation     Shoulder external rotation       (Blank rows = not tested)  CERVICAL AROM: All within normal limits:    UPPER EXTREMITY STRENGTH:   LYMPHEDEMA ASSESSMENTS:  SURGERY TYPE/DATE: 01/04/2023 Left  lumpectomy NUMBER OF LYMPH NODES REMOVED: 0/6 CHEMOTHERAPY: YES RADIATION:YES HORMONE TREATMENT: NO INFECTIONS: NO  LYMPHEDEMA ASSESSMENTS:   LANDMARK RIGHT  eval  At axilla  33.6  15 cm proximal to olecranon process   10 cm proximal to olecranon process 31.9  Olecranon process 27.7  15 cm proximal to ulnar styloid process   10 cm proximal to ulnar styloid process 24.05  Just proximal to ulnar styloid process 15.5  Across hand at thumb web space 19.35  At base of 2nd digit 5.65  (Blank rows = not tested)  LANDMARK LEFT  eval  At axilla  32.9  15 cm proximal to olecranon process   10 cm proximal to olecranon process 32.3  Olecranon process 27.4  15 cm proximal to ulnar styloid process   10 cm proximal to ulnar styloid process 23.7  Just proximal to ulnar styloid process 15.2  Across hand at thumb web space 18.8  At base of 2nd digit 5.7  (Blank rows = not tested)  GAIT:WNL  QUICK DASH SURVEY: 39%  TREATMENT DATE: 09/24/2023 THEREx Pulleys - easy  Ball rolls flexion x 5, abd on left x 5 with 5" holds for each  Half foam roll: AROM flexion with dowel and 2# added, snow angels x 10 ea LTR B x 3 15 sec hold MANUAL MFR longitudinal stretch and "S"with arm at 90 degrees and 120 degrees on pillow to decrease cording PROM left shoulder flexion, abd, D2 flex Measured left shoulder AROM  09/17/2023 THEREx Pulleys into flexion and abd for 2:30 sec each (easy) Ball rolls flexion x 5, abd on left x 5 Half foam roll: AROM flexion, horizontal abd x 5 , snow angels x 10 ea LTR B x 3 15 sec hold MANUAL MFR longitudinal stretch and "S"with arm at 90 degrees and 120 degrees on pillow to decrease cording PROM left shoulder flexion, abd, D2 flex Measured left shoulder AROM  09/13/2023 THEREx Pulleys into flexion and abd for 2:30 sec each Ball rolls flexion  x 10, abd on left x 5 Half foam roll: 3 D AROM flexion, scaption, horizontal abd x 5 , snow angels x 5 ea, 10 sec hold LTR B x 3 15 sec hold MANUAL MFR longitudinal stretch and "S"with arm at 90 degrees and 120 degrees on pillow to decrease cording PROM left shoulder flexion, abd, D2 flex Measured left shoulder AROM  09/10/2023 Pulleys into flexion and abd for 2:30 sec each Ball rolls flexion x 10, abd on left x 5 Supine over half foam roll: prolong chest stretch 2x60", snow angel x 8, bil horizontal abduction, bil Y motion x 8, alternating flexion x 6 MANUAL MFR longitudinal stretch and "S"with arm at 90 degrees and 120 degrees on pillow to decrease cording PROM left shoulder flexion, abd, D2 flex   PATIENT EDUCATION:  Education details: educated in cording and the need to gently stretch to improve, avoid guarding, 4 post op exercises to start to improve ROM, no lymphedema or infection Person educated: Patient Education method: Programmer, multimedia, Demonstration, and Handouts Education comprehension: verbalized understanding and returned demonstration  HOME EXERCISE PROGRAM: 4 post op exercises, wrist ext with arm in various positions to feel stretch  ASSESSMENT:  CLINICAL IMPRESSION: Cording pull and palpation limited to near radial head and 3-4inches proximally.   OBJECTIVE IMPAIRMENTS: decreased activity tolerance, decreased knowledge of condition, decreased ROM, increased fascial restrictions, impaired UE functional use, postural dysfunction, and pain.   ACTIVITY LIMITATIONS: lifting and reach over head  PARTICIPATION LIMITATIONS: cleaning and yard work  PERSONAL FACTORS: 3+ comorbidities: Left  triple negative breast cancer s/p chemo and radiation  are also affecting patient's functional outcome.   REHAB POTENTIAL: Good  CLINICAL DECISION MAKING: Evolving/moderate complexity  EVALUATION COMPLEXITY: Moderate  GOALS: Goals reviewed with patient? Yes  SHORT TERM GOALS: Target  date: 09/19/2023  Pt will be independent in HEP for stretching and strengthening of left UE Baseline: Goal status: MET  2.  Pt will report pain/tenderness from cording improved by 25% Baseline:  Goal status: MET 09/06/2023  3.  Left shoulder ROM will be improved by 20 degrees fro shoulder flexion and 40 degrees for Abd for improved reaching Baseline: 122 flex, 90 abd Goal status: MET 09/17/23    LONG TERM GOALS: Target date: 10/10/2023  Pain will be improved by atleast 50% for improved function Baseline:  Goal status: MET 09/06/2023  2.  Left shoulder ROM will be within 10 degrees of Right shoulder ROM for improved reaching Baseline:  Goal status: MET  3.  Quick dash will be  no greater than 15% to demonstrate improved function in Left UE Baseline:  Goal status: INITIAL  PLAN:  PT FREQUENCY: 2x/week  PT DURATION: 6 weeks  PLANNED INTERVENTIONS: 97164- PT Re-evaluation, 97110-Therapeutic exercises, 97530- Therapeutic activity, 97112- Neuromuscular re-education, 97535- Self Care, 16109- Manual therapy, and 97760- Orthotic Fit/training, Dry Needling  PLAN FOR NEXT SESSION: Review HEP, pulleys, wand, MFR techniques to cording, cupping/IASTM prn for cording, progress to strength when ready  Encarnacion Harris, PT 09/24/2023, 10:34 PM

## 2023-09-27 ENCOUNTER — Encounter: Admitting: Rehabilitation

## 2023-10-01 ENCOUNTER — Ambulatory Visit: Admitting: Rehabilitation

## 2023-10-01 ENCOUNTER — Encounter: Payer: Self-pay | Admitting: Rehabilitation

## 2023-10-01 DIAGNOSIS — R293 Abnormal posture: Secondary | ICD-10-CM

## 2023-10-01 DIAGNOSIS — Z483 Aftercare following surgery for neoplasm: Secondary | ICD-10-CM

## 2023-10-01 DIAGNOSIS — Z9189 Other specified personal risk factors, not elsewhere classified: Secondary | ICD-10-CM

## 2023-10-01 DIAGNOSIS — C50412 Malignant neoplasm of upper-outer quadrant of left female breast: Secondary | ICD-10-CM

## 2023-10-01 DIAGNOSIS — L905 Scar conditions and fibrosis of skin: Secondary | ICD-10-CM

## 2023-10-01 NOTE — Therapy (Signed)
 OUTPATIENT PHYSICAL THERAPY  UPPER EXTREMITY ONCOLOGY  Patient Name: Nicole Maddox MRN: [id number] DOB:19-Jan-1981, 43 y.o., female Today's Date: 10/01/2023  END OF SESSION:  PT End of Session - 10/01/23 0847     Visit Number 8    Number of Visits 12    Date for PT Re-Evaluation 10/10/23    PT Start Time 0800    PT Stop Time 0843    PT Time Calculation (min) 43 min    Activity Tolerance Patient tolerated treatment well    Behavior During Therapy [facility] for tasks assessed/performed                  Past Medical History:  Diagnosis Date   Breast cancer (HCC) 06/23/2022   Family history of adverse reaction to anesthesia    mother with h/o slow to wake   Family history of stomach cancer    GERD (gastroesophageal reflux disease)    Hyperthyroidism    due to Graves' disease   Multinodular thyroid     Personal history of chemotherapy    Personal history of radiation therapy    Thyroid  disease    Past Surgical History:  Procedure Laterality Date   BREAST BIOPSY Left 06/23/2022   US  LT BREAST BX W LOC DEV 1ST LESION IMG BX SPEC US  GUIDE 06/23/2022 [facility]   BREAST BIOPSY Left 01/02/2023   US  LT RADIOACTIVE SEED LOC 01/02/2023 [facility]   BREAST BIOPSY  01/02/2023   MM RT RADIOACTIVE SEED LOC MAMMO GUIDE 01/02/2023 [facility]   BREAST LUMPECTOMY Left 01/04/2023   BREAST LUMPECTOMY WITH RADIOACTIVE SEED AND SENTINEL LYMPH NODE BIOPSY Left 01/04/2023   Procedure: LEFT BREAST LUMPECTOMY WITH RADIOACTIVE SEED AND SENTINEL LYMPH NODE BIOPSY;  Surgeon: Caralyn Chandler, MD;  Location: [facility] OR;  Service: General;  Laterality: Left;  PEC BLOCK   BREAST LUMPECTOMY WITH RADIOACTIVE SEED LOCALIZATION Right 01/04/2023   Procedure: RIGHT BREAST LUMPECTOMY WITH RADIOACTIVE SEED LOCALIZATION;  Surgeon: Caralyn Chandler, MD;  Location: [facility] OR;  Service: General;  Laterality: Right;   BREAST REDUCTION WITH MASTOPEXY Bilateral 01/04/2023   Procedure: BREAST REDUCTION WITH  MASTOPEXY;  Surgeon: Thornell Flirt, DO;  Location: [facility] OR;  Service: Plastics;  Laterality: Bilateral;   PORT-A-CATH REMOVAL N/A 03/01/2023   Procedure: REMOVAL PORT-A-CATH;  Surgeon: Caralyn Chandler, MD;  Location: [facility];  Service: General;  Laterality: N/A;   PORTACATH PLACEMENT Right 07/19/2022   Procedure: INSERTION PORT-A-CATH;  Surgeon: Caralyn Chandler, MD;  Location: [facility];  Service: General;  Laterality: Right;  60 MIN ROOM 8   WISDOM TOOTH EXTRACTION     Patient Active Problem List   Diagnosis Date Noted   Elevated cholesterol 08/23/2023   Vitamin D  deficiency 08/08/2023   Port-A-Cath in place 07/20/2022   Genetic testing 07/07/2022   Family history of stomach cancer 06/29/2022   Malignant neoplasm of upper-outer quadrant of left female breast (HCC) 06/27/2022   Seborrheic dermatitis of scalp 02/25/2018    PCP:   REFERRING PROVIDER: Murleen Arms, MD  REFERRING DIAG: Left UE swelling/tenderness s/p breast Cancer  THERAPY DIAG:  Aftercare following surgery for neoplasm  Abnormal posture  Axillary web syndrome  Malignant neoplasm of upper-outer quadrant of left female breast, unspecified estrogen receptor status (HCC)  At risk for lymphedema  ONSET DATE: 08/24/2023  Rationale for Evaluation and Treatment: Rehabilitation  SUBJECTIVE:  SUBJECTIVE STATEMENT: I just feel it every once in a while.    EVAL: My left arm is sore to touch from the upper arm to the forearm and I feel like it is swollen. I tend to hold my arm across the waist because it feels better. I get occasional shooting pains in the breast that is not new. They called in an antibiotic for me but I have not picked it up yet.  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  Completed chemotherapy. On 01/04/2023 pt had a Lt lumpectomy with 6 negative nodes. Completed radiation 03/29/23. On Capecitabine  (Xeloda ) chemo. Pt notices swelling in bilateral hands with walking which resolves  PAIN:  Are you having pain? Yes NPRS scale: 0/10 Pain location:   Pain orientation: Left  PAIN TYPE: aching, tight, and constant Pain description: constant  Aggravating factors: movement, light touch Relieving factors: not moving  PRECAUTIONS: Lt lymphedema risk   RED FLAGS: None   WEIGHT BEARING RESTRICTIONS: No  FALLS:  Has patient fallen in last 6 months? No  LIVING ENVIRONMENT: Lives with: lives alone, sometimes goes to see her mom in [place], [place] Lives in: House/apartment  OCCUPATION: On leave  LEISURE: reading,  walking  HAND DOMINANCE: right   PRIOR LEVEL OF FUNCTION: Independent  PATIENT GOALS: get pain better, check swelling   OBJECTIVE: Note: Objective measures were completed at Evaluation unless otherwise noted.  COGNITION: Overall cognitive status: Within functional limits for tasks assessed   PALPATION: Tender throughout left forearm and upper arm, multiple cords palpable but not visible  OBSERVATIONS / OTHER ASSESSMENTS: pt very guarded/anxious and wants to hold arm across waist  SENSATION: Light touch: Deficits      POSTURE: forward head, rounded shoulders  UPPER EXTREMITY AROM/PROM:  A/PROM RIGHT   eval   Shoulder extension 51  Shoulder flexion 147  Shoulder abduction 175  Shoulder internal rotation   Shoulder external rotation     (Blank rows = not tested)  A/PROM LEFT   eval LEFT 09/13/2023 09/17/23  Shoulder extension 33 55 55  Shoulder flexion 122 161 161  Shoulder abduction 90 137 175  Shoulder internal rotation     Shoulder external rotation       (Blank rows = not tested)  CERVICAL AROM: All within normal limits:    UPPER EXTREMITY STRENGTH:   LYMPHEDEMA ASSESSMENTS:  SURGERY TYPE/DATE: 01/04/2023 Left  lumpectomy NUMBER OF LYMPH NODES REMOVED: 0/6 CHEMOTHERAPY: YES RADIATION:YES HORMONE TREATMENT: NO INFECTIONS: NO  LYMPHEDEMA ASSESSMENTS:   LANDMARK RIGHT  eval  At axilla  33.6  15 cm proximal to olecranon process   10 cm proximal to olecranon process 31.9  Olecranon process 27.7  15 cm proximal to ulnar styloid process   10 cm proximal to ulnar styloid process 24.05  Just proximal to ulnar styloid process 15.5  Across hand at thumb web space 19.35  At base of 2nd digit 5.65  (Blank rows = not tested)  LANDMARK LEFT  eval  At axilla  32.9  15 cm proximal to olecranon process   10 cm proximal to olecranon process 32.3  Olecranon process 27.4  15 cm proximal to ulnar styloid process   10 cm proximal to ulnar styloid process 23.7  Just proximal to ulnar styloid process 15.2  Across hand at thumb web space 18.8  At base of 2nd digit 5.7  (Blank rows = not tested)  GAIT:WNL  QUICK DASH SURVEY: 39%  TREATMENT DATE: 10/01/2023 THEREx Pulleys into abduction x Ball rolls flexion x 5, abd on left x 5 with 5" holds for each  Half foam roll: , prolonged chest stretch x 90", AROM flexion and then overhead with dowel and 2# added, snow angels x 10 ea LTR B x 3 15 sec hold MANUAL MFR longitudinal stretch and "S"with arm at 90 degrees and 120 degrees on pillow to decrease cording  09/24/2023 THEREx Pulleys - easy  Ball rolls flexion x 5, abd on left x 5 with 5" holds for each  Half foam roll: AROM flexion with dowel and 2# added, snow angels x 10 ea LTR B x 3 15 sec hold MANUAL MFR longitudinal stretch and "S"with arm at 90 degrees and 120 degrees on pillow to decrease cording PROM left shoulder flexion, abd, D2 flex Measured left shoulder AROM  09/17/2023 THEREx Pulleys into flexion and abd for 2:30 sec each (easy) Ball rolls flexion x 5, abd  on left x 5 Half foam roll: AROM flexion, horizontal abd x 5 , snow angels x 10 ea LTR B x 3 15 sec hold MANUAL MFR longitudinal stretch and "S"with arm at 90 degrees and 120 degrees on pillow to decrease cording PROM left shoulder flexion, abd, D2 flex Measured left shoulder AROM  09/13/2023 THEREx Pulleys into flexion and abd for 2:30 sec each Ball rolls flexion x 10, abd on left x 5 Half foam roll: 3 D AROM flexion, scaption, horizontal abd x 5 , snow angels x 5 ea, 10 sec hold LTR B x 3 15 sec hold MANUAL MFR longitudinal stretch and "S"with arm at 90 degrees and 120 degrees on pillow to decrease cording PROM left shoulder flexion, abd, D2 flex Measured left shoulder AROM  09/10/2023 Pulleys into flexion and abd for 2:30 sec each Ball rolls flexion x 10, abd on left x 5 Supine over half foam roll: prolong chest stretch 2x60", snow angel x 8, bil horizontal abduction, bil Y motion x 8, alternating flexion x 6 MANUAL MFR longitudinal stretch and "S"with arm at 90 degrees and 120 degrees on pillow to decrease cording PROM left shoulder flexion, abd, D2 flex   PATIENT EDUCATION:  Education details: educated in cording and the need to gently stretch to improve, avoid guarding, 4 post op exercises to start to improve ROM, no lymphedema or infection Person educated: Patient Education method: Programmer, multimedia, Demonstration, and Handouts Education comprehension: verbalized understanding and returned demonstration  HOME EXERCISE PROGRAM: 4 post op exercises, wrist ext with arm in various positions to feel stretch  ASSESSMENT:  CLINICAL IMPRESSION: Ready for DC today with all goals met.  Will continue with [facility] screenings and will let us  know if anything returns.    OBJECTIVE IMPAIRMENTS: decreased activity tolerance, decreased knowledge of condition, decreased ROM, increased fascial restrictions, impaired UE functional use, postural dysfunction, and pain.   ACTIVITY LIMITATIONS:  lifting and reach over head  PARTICIPATION LIMITATIONS: cleaning and yard work  PERSONAL FACTORS: 3+ comorbidities: Left  triple negative breast cancer s/p chemo and radiation  are also affecting patient's functional outcome.   REHAB POTENTIAL: Good  CLINICAL DECISION MAKING: Evolving/moderate complexity  EVALUATION COMPLEXITY: Moderate  GOALS: Goals reviewed with patient? Yes  SHORT TERM GOALS: Target date: 09/19/2023  Pt will be independent in HEP for stretching and strengthening of left UE Baseline: Goal status: MET  2.  Pt will report pain/tenderness from cording improved by 25% Baseline:  Goal status: MET 09/06/2023  3.  Left  shoulder ROM will be improved by 20 degrees fro shoulder flexion and 40 degrees for Abd for improved reaching Baseline: 122 flex, 90 abd Goal status: MET 09/17/23    LONG TERM GOALS: Target date: 10/10/2023  Pain will be improved by atleast 50% for improved function Baseline:  Goal status: MET 09/06/2023  2.  Left shoulder ROM will be within 10 degrees of Right shoulder ROM for improved reaching Baseline:  Goal status: MET  3.  Quick dash will be no greater than 15% to demonstrate improved function in Left UE Baseline:  Goal status: MET  PLAN:  PT FREQUENCY: 2x/week  PT DURATION: 6 weeks  PLANNED INTERVENTIONS: 97164- PT Re-evaluation, 97110-Therapeutic exercises, 97530- Therapeutic activity, 97112- Neuromuscular re-education, 97535- Self Care, 16109- Manual therapy, and 97760- Orthotic Fit/training, Dry Needling  PLAN FOR NEXT SESSION: Review HEP, pulleys, wand, MFR techniques to cording, cupping/IASTM prn for cording, progress to strength when ready  Encarnacion Harris, PT 10/01/2023, 8:48 AM  PHYSICAL THERAPY DISCHARGE SUMMARY  Visits from Start of Care: 8  Current functional level related to goals / functional outcomes: All met   Remaining deficits: Lymphedema risk   Education / Equipment: Self care HEP  Plan: Patient agrees to  discharge.  Patient is being discharged due to meeting the stated rehab goals.

## 2023-10-03 ENCOUNTER — Telehealth: Payer: Self-pay

## 2023-10-03 NOTE — Telephone Encounter (Signed)
 Spoke with patient and confirmed appointment on 10/04/23

## 2023-10-04 ENCOUNTER — Inpatient Hospital Stay: Attending: Hematology and Oncology

## 2023-10-04 ENCOUNTER — Ambulatory Visit: Admitting: Bariatrics

## 2023-10-04 ENCOUNTER — Encounter: Payer: Self-pay | Admitting: Bariatrics

## 2023-10-04 ENCOUNTER — Inpatient Hospital Stay: Admitting: Hematology and Oncology

## 2023-10-04 ENCOUNTER — Ambulatory Visit

## 2023-10-04 VITALS — BP 108/78 | HR 79 | Temp 98.3°F | Resp 16 | Wt 190.6 lb

## 2023-10-04 VITALS — BP 117/73 | HR 88 | Temp 97.9°F | Ht 64.0 in | Wt 188.0 lb

## 2023-10-04 DIAGNOSIS — C50412 Malignant neoplasm of upper-outer quadrant of left female breast: Secondary | ICD-10-CM

## 2023-10-04 DIAGNOSIS — Z1722 Progesterone receptor negative status: Secondary | ICD-10-CM | POA: Insufficient documentation

## 2023-10-04 DIAGNOSIS — N951 Menopausal and female climacteric states: Secondary | ICD-10-CM | POA: Insufficient documentation

## 2023-10-04 DIAGNOSIS — N644 Mastodynia: Secondary | ICD-10-CM | POA: Insufficient documentation

## 2023-10-04 DIAGNOSIS — Z803 Family history of malignant neoplasm of breast: Secondary | ICD-10-CM | POA: Insufficient documentation

## 2023-10-04 DIAGNOSIS — R5383 Other fatigue: Secondary | ICD-10-CM | POA: Diagnosis not present

## 2023-10-04 DIAGNOSIS — D649 Anemia, unspecified: Secondary | ICD-10-CM | POA: Insufficient documentation

## 2023-10-04 DIAGNOSIS — Z800 Family history of malignant neoplasm of digestive organs: Secondary | ICD-10-CM | POA: Insufficient documentation

## 2023-10-04 DIAGNOSIS — Z79620 Long term (current) use of immunosuppressive biologic: Secondary | ICD-10-CM | POA: Diagnosis not present

## 2023-10-04 DIAGNOSIS — E669 Obesity, unspecified: Secondary | ICD-10-CM | POA: Diagnosis not present

## 2023-10-04 DIAGNOSIS — E039 Hypothyroidism, unspecified: Secondary | ICD-10-CM | POA: Diagnosis not present

## 2023-10-04 DIAGNOSIS — Z1732 Human epidermal growth factor receptor 2 negative status: Secondary | ICD-10-CM | POA: Diagnosis not present

## 2023-10-04 DIAGNOSIS — E559 Vitamin D deficiency, unspecified: Secondary | ICD-10-CM | POA: Insufficient documentation

## 2023-10-04 DIAGNOSIS — E88819 Insulin resistance, unspecified: Secondary | ICD-10-CM | POA: Diagnosis not present

## 2023-10-04 DIAGNOSIS — M898X9 Other specified disorders of bone, unspecified site: Secondary | ICD-10-CM | POA: Insufficient documentation

## 2023-10-04 DIAGNOSIS — E66811 Obesity, class 1: Secondary | ICD-10-CM

## 2023-10-04 DIAGNOSIS — Z170 Estrogen receptor positive status [ER+]: Secondary | ICD-10-CM | POA: Diagnosis not present

## 2023-10-04 DIAGNOSIS — F32A Depression, unspecified: Secondary | ICD-10-CM | POA: Diagnosis not present

## 2023-10-04 DIAGNOSIS — Z79899 Other long term (current) drug therapy: Secondary | ICD-10-CM | POA: Diagnosis not present

## 2023-10-04 DIAGNOSIS — Z6832 Body mass index (BMI) 32.0-32.9, adult: Secondary | ICD-10-CM

## 2023-10-04 DIAGNOSIS — L271 Localized skin eruption due to drugs and medicaments taken internally: Secondary | ICD-10-CM | POA: Diagnosis not present

## 2023-10-04 LAB — CBC WITH DIFFERENTIAL/PLATELET
Abs Immature Granulocytes: 0.02 10*3/uL (ref 0.00–0.07)
Basophils Absolute: 0 10*3/uL (ref 0.0–0.1)
Basophils Relative: 1 %
Eosinophils Absolute: 0.3 10*3/uL (ref 0.0–0.5)
Eosinophils Relative: 6 %
HCT: 27.7 % — ABNORMAL LOW (ref 36.0–46.0)
Hemoglobin: 8.9 g/dL — ABNORMAL LOW (ref 12.0–15.0)
Immature Granulocytes: 0 %
Lymphocytes Relative: 24 %
Lymphs Abs: 1.2 10*3/uL (ref 0.7–4.0)
MCH: 26.7 pg (ref 26.0–34.0)
MCHC: 32.1 g/dL (ref 30.0–36.0)
MCV: 83.2 fL (ref 80.0–100.0)
Monocytes Absolute: 0.5 10*3/uL (ref 0.1–1.0)
Monocytes Relative: 11 %
Neutro Abs: 2.8 10*3/uL (ref 1.7–7.7)
Neutrophils Relative %: 58 %
Platelets: 290 10*3/uL (ref 150–400)
RBC: 3.33 MIL/uL — ABNORMAL LOW (ref 3.87–5.11)
WBC: 4.9 10*3/uL (ref 4.0–10.5)
nRBC: 0.4 % — ABNORMAL HIGH (ref 0.0–0.2)

## 2023-10-04 LAB — CMP (CANCER CENTER ONLY)
ALT: 15 U/L (ref 0–44)
AST: 17 U/L (ref 15–41)
Albumin: 4.3 g/dL (ref 3.5–5.0)
Alkaline Phosphatase: 57 U/L (ref 38–126)
Anion gap: 5 (ref 5–15)
BUN: 7 mg/dL (ref 6–20)
CO2: 29 mmol/L (ref 22–32)
Calcium: 8.7 mg/dL — ABNORMAL LOW (ref 8.9–10.3)
Chloride: 106 mmol/L (ref 98–111)
Creatinine: 0.84 mg/dL (ref 0.44–1.00)
GFR, Estimated: >60 mL/min (ref >60)
Glucose, Bld: 81 mg/dL (ref 70–99)
Potassium: 3.4 mmol/L — ABNORMAL LOW (ref 3.5–5.1)
Sodium: 140 mmol/L (ref 135–145)
Total Bilirubin: 0.4 mg/dL (ref 0.0–1.2)
Total Protein: 6.8 g/dL (ref 6.5–8.1)

## 2023-10-04 LAB — TSH: TSH: 0.668 u[IU]/mL (ref 0.350–4.500)

## 2023-10-04 MED ORDER — VENLAFAXINE HCL ER 37.5 MG PO CP24: 75.0000 mg | ORAL_CAPSULE | Freq: Every day | ORAL | 1 refills | Status: DC

## 2023-10-04 MED ORDER — VITAMIN D (ERGOCALCIFEROL) 1.25 MG (50000 UNIT) PO CAPS: 50000.0000 [IU] | ORAL_CAPSULE | ORAL | 0 refills | Status: DC

## 2023-10-04 NOTE — Progress Notes (Signed)
 WEIGHT SUMMARY AND BIOMETRICS  Weight Lost Since Last Visit: 0  Weight Gained Since Last Visit: 0   Vitals Temp: 97.9 F (36.6 C) BP: 117/73 Pulse Rate: 88 SpO2: 100 %   Anthropometric Measurements Height: 5\' 4"  (1.626 m) Weight: 188 lb (85.3 kg) BMI (Calculated): 32.25 Weight at Last Visit: 188lb Weight Lost Since Last Visit: 0 Weight Gained Since Last Visit: 0 Starting Weight: 187lb Total Weight Loss (lbs): 0 lb (0 kg)   Body Composition  Body Fat %: 38.3 % Fat Mass (lbs): 72 lbs Muscle Mass (lbs): 110.4 lbs Total Body Water (lbs): 76 lbs Visceral Fat Rating : 8   Other Clinical Data Fasting: no Labs: no Today's Visit #: 4 Starting Date: 08/22/23    OBESITY Osmary is here to discuss her progress with her obesity treatment plan along with follow-up of her obesity related diagnoses.    Nutrition Plan: the Category 2 plan - 50% adherence.  Current exercise: walking  Interim History:  Her weight remains the same as her last visit.  Eating all of the food on the plan., Protein intake is as prescribed, Is not skipping meals, and Water intake is adequate.   Pharmacotherapy: Yanina is on Metformin 500 mg once daily breakfast Adverse side effects: None Hunger is moderately controlled.  Cravings are moderately controlled.  Assessment/Plan:   Vitamin D  Deficiency Vitamin D  is not at goal of 50.  Most recent vitamin D  level was 20.3. She is on  prescription ergocalciferol  50,000 IU weekly. Lab Results  Component Value Date   VD25OH 20.3 (L) 08/22/2023    Plan: Refill prescription vitamin D  50,000 IU weekly.   Insulin  Resistance Carin has had elevated fasting insulin  readings. Goal is HgbA1c < 5.7, fasting insulin  at l0 or less, and preferably at 5.  She r denies polyphagia. Medication(s): none Lab Results  Component Value Date    HGBA1C 5.3 08/22/2023   Lab Results  Component Value Date   INSULIN  14.0 08/22/2023    Plan Will work on the agreed upon plan. Will minimize refined carbohydrates ( sweets and starches), and focus more on complex carbohydrates.  Increase the micronutrients found in leafy greens, which include magnesium, polyphenols, and vitamin C which have been postulated to help with insulin  sensitivity. Minimize "fast food" and cook more meals at home.  Increase fiber to 25 to 30 grams daily.  Reviewed her meals over the last few weeks and her protein is very low at around 30 to 40 g/day.  She will increase her protein to 80 to 90 g daily.  He will continue to journal on a regular basis and will have approximately 1200 cal daily and increase her protein as stated.    Generalized Obesity: Current BMI BMI (Calculated): 32.25    Sharanya is currently in the action stage of change. As such, her goal is to continue  with weight loss efforts.  She has agreed to the Category 2 plan.  Exercise goals: For substantial health benefits, adults should do at least 150 minutes (2 hours and 30 minutes) a week of moderate-intensity, or 75 minutes (1 hour and 15 minutes) a week of vigorous-intensity aerobic physical activity, or an equivalent combination of moderate- and vigorous-intensity aerobic activity. Aerobic activity should be performed in episodes of at least 10 minutes, and preferably, it should be spread throughout the week.  Behavioral modification strategies: increasing lean protein intake, decreasing simple carbohydrates , no meal skipping, meal planning , increase water intake, better snacking choices, planning for success, and increasing fiber rich foods.  Eulanda has agreed to follow-up with our clinic in 2 weeks.        Objective:   VITALS: Per patient if applicable, see vitals. GENERAL: Alert and in no acute distress. CARDIOPULMONARY: No increased WOB. Speaking in clear sentences.  PSYCH:  Pleasant and cooperative. Speech normal rate and rhythm. Affect is appropriate. Insight and judgement are appropriate. Attention is focused, linear, and appropriate.  NEURO: Oriented as arrived to appointment on time with no prompting.   Attestation Statements:   This was prepared with the assistance of Engineer, civil (consulting).  Occasional wrong-word or sound-a-like substitutions may have occurred due to the inherent limitations of voice recognition   Kirk Peper, DO

## 2023-10-04 NOTE — Progress Notes (Deleted)
 Oncology History  Malignant neoplasm of upper-outer quadrant of left female breast (HCC)  06/23/2022 Initial Diagnosis   Left breast needle core biopsy at 1:00, 10 cmfn: IDC< grade 3, 1.1 cm, ER 30% positive, weak staining intensity, PR negative, Ki-67 95%, HER2 negative.  Axillary node negative.    06/28/2022 Cancer Staging   Staging form: Breast, AJCC 8th Edition - Clinical stage from 06/28/2022: Stage IB (cT1c, cN0(f), cM0, G3, ER+, PR-, HER2-) - Signed by Bettejane Brownie, PA-C on 06/28/2022 Stage prefix: Initial diagnosis Method of lymph node assessment: Core biopsy Histologic grading system: 3 grade system   07/11/2022 Genetic Testing   Negative genetic testing on the 9 gene STAT panel.  The report date is July 05, 2022. Negative genetic testing on the Multi-cancer panel.  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  BLM c.3136G>A (p.Gly1046Ser), EGFR c.61G>A (p.Ala21Thr) and PDGFRA c.50G>T (p.Gly17Val) VUS identified.  The report date is July 11, 2022.  The STAT Breast cancer panel offered by [facility] includes sequencing and rearrangement analysis for the following 9 genes:  ATM, BRCA1, BRCA2, CDH1, CHEK2, PALB2, PTEN, STK11 and TP53.   The Multi-Cancer + RNA Panel offered by [facility] includes sequencing and/or deletion/duplication analysis of the following 70 genes:  AIP*, ALK, APC*, ATM*, AXIN2*, BAP1*, BARD1*, BLM*, BMPR1A*, BRCA1*, BRCA2*, BRIP1*, CDC73*, CDH1*, CDK4, CDKN1B*, CDKN2A, CHEK2*, CTNNA1*, DICER1*, EPCAM (del/dup only), EGFR, FH*, FLCN*, GREM1 (promoter dup only), HOXB13, KIT, LZTR1, MAX*, MBD4, MEN1*, MET, MITF, MLH1*, MSH2*, MSH3*, MSH6*, MUTYH*, NF1*, NF2*, NTHL1*, PALB2*, PDGFRA, PMS2*, POLD1*, POLE*, POT1*, PRKAR1A*, PTCH1*, PTEN*, RAD51C*, RAD51D*, RB1*, RET, SDHA* (sequencing only), SDHAF2*, SDHB*, SDHC*, SDHD*, SMAD4*, SMARCA4*, SMARCB1*, SMARCE1*, STK11*, SUFU*, TMEM127*, TP53*, TSC1*, TSC2*, VHL*. RNA analysis is performed for * genes.    07/20/2022 - 11/30/2022 Chemotherapy    Patient is on Treatment Plan : BREAST ADJUVANT DOSE DENSE AC q14d / PACLitaxel  q7d     07/21/2022 Procedure   Right breast needle core biopsy upper outer breast x 2: Negative for malignancy, left breast needle core biopsy lower: Negative for malignancy.   08/07/2022 Procedure   2 additional right breast needle core biopsies both negative for malignancy   01/04/2023 Surgery   Left breast lumpectomy: Invasive ductal carcinoma, 1.6 cm, grade 3, margins negative, 6 lymph nodes negative for malignancy.  Repeat prognostic panel: Triple negative breast cancer with a Ki67 of 90%.  Right breast lumpectomy: Negative for carcinoma.  Patient also underwent bilateral mammoplasties and path was negative for carcinoma on both specimen submitted.    - 03/29/2023 Radiation Therapy   Left breast: 40.05 Gy in 15 treatments "Boost": 10 cGy in 5 treatments    04/17/2023 -  Chemotherapy   Patient is on Treatment Plan : BREAST Capecitabine  q21d        INTERVAL HISTORY:  Ms. Latchford is here for follow up. She is excited to start last cycle of xeloda  on 10/08/2023.  Rest of the pertinent 10 point ROS reviewed and neg.  PAST MEDICAL/SURGICAL HISTORY:  Past Medical History:  Diagnosis Date   Breast cancer (HCC) 06/23/2022   Family history of adverse reaction to anesthesia    mother with h/o slow to wake   Family history of stomach cancer    GERD (gastroesophageal reflux disease)    Hyperthyroidism    due to Graves' disease   Multinodular thyroid     Personal history of chemotherapy    Personal history of radiation therapy    Thyroid  disease    Past Surgical History:  Procedure Laterality Date  BREAST BIOPSY Left 06/23/2022   US  LT BREAST BX W LOC DEV 1ST LESION IMG BX SPEC US  GUIDE 06/23/2022 [facility]   BREAST BIOPSY Left 01/02/2023   US  LT RADIOACTIVE SEED LOC 01/02/2023 [facility]   BREAST BIOPSY  01/02/2023   MM RT RADIOACTIVE SEED LOC MAMMO GUIDE 01/02/2023 [facility]   BREAST  LUMPECTOMY Left 01/04/2023   BREAST LUMPECTOMY WITH RADIOACTIVE SEED AND SENTINEL LYMPH NODE BIOPSY Left 01/04/2023   Procedure: LEFT BREAST LUMPECTOMY WITH RADIOACTIVE SEED AND SENTINEL LYMPH NODE BIOPSY;  Surgeon: Caralyn Chandler, MD;  Location: [facility] OR;  Service: General;  Laterality: Left;  PEC BLOCK   BREAST LUMPECTOMY WITH RADIOACTIVE SEED LOCALIZATION Right 01/04/2023   Procedure: RIGHT BREAST LUMPECTOMY WITH RADIOACTIVE SEED LOCALIZATION;  Surgeon: Caralyn Chandler, MD;  Location: [facility] OR;  Service: General;  Laterality: Right;   BREAST REDUCTION WITH MASTOPEXY Bilateral 01/04/2023   Procedure: BREAST REDUCTION WITH MASTOPEXY;  Surgeon: Thornell Flirt, DO;  Location: [facility] OR;  Service: Plastics;  Laterality: Bilateral;   PORT-A-CATH REMOVAL N/A 03/01/2023   Procedure: REMOVAL PORT-A-CATH;  Surgeon: Caralyn Chandler, MD;  Location: [facility];  Service: General;  Laterality: N/A;   PORTACATH PLACEMENT Right 07/19/2022   Procedure: INSERTION PORT-A-CATH;  Surgeon: Caralyn Chandler, MD;  Location: [facility];  Service: General;  Laterality: Right;  60 MIN ROOM 8   WISDOM TOOTH EXTRACTION       ALLERGIES:  Allergies  Allergen Reactions   Adriamycin  [Doxorubicin ] Other (See Comments)    Patient c/o of lower back pain and right arm pain. See progress note from 07/20/22     CURRENT MEDICATIONS:  Outpatient Encounter Medications as of 10/04/2023  Medication Sig   capecitabine  (XELODA ) 500 MG tablet Take 4 tablets (2,000 mg total) by mouth 2 (two) times daily after a meal. Take on days 1-14. Repeat every 21 days.   COD LIVER OIL PO Take by mouth daily.   Cyanocobalamin (VITAMIN B-12 PO) Take by mouth daily.   DENTA 5000 PLUS 1.1 % CREA dental cream Place 1 Application onto teeth daily.   diclofenac  Sodium (VOLTAREN ) 1 % GEL Research Patient: Apply 0.5 grams (1 fingertip) to each hand and each foot twice daily for up to 12 weeks   doxycycline  (VIBRA -TABS) 100 MG  tablet Take 1 tablet (100 mg total) by mouth 2 (two) times daily.   folic acid  (FOLVITE ) 1 MG tablet Take 1 mg by mouth daily.   loratadine (CLARITIN) 10 MG tablet Take 10 mg by mouth daily.   metFORMIN (GLUCOPHAGE) 500 MG tablet    methimazole (TAPAZOLE) 5 MG tablet Take 15 mg by mouth 2 (two) times daily.   omeprazole (PRILOSEC) 40 MG capsule Take 40 mg by mouth daily.   Vitamin D , Ergocalciferol , (DRISDOL ) 1.25 MG (50000 UNIT) CAPS capsule Take 1 capsule (50,000 Units total) by mouth every 7 (seven) days.   No facility-administered encounter medications on file as of 10/04/2023.     ONCOLOGIC FAMILY HISTORY:  Family History  Problem Relation Age of Onset   Sleep apnea Mother    Obesity Mother    Heart attack Father 17   Heart Problems Maternal Grandmother    Stomach cancer Maternal Aunt        dx > 50   Throat cancer Maternal Uncle        dx 61s   Stomach cancer Maternal Uncle        dx > 50  Breast cancer Cousin        mother's maternal first cousin     SOCIAL HISTORY:  Social History   Socioeconomic History   Marital status: Single    Spouse name: Not on file   Number of children: Not on file   Years of education: Not on file   Highest education level: Not on file  Occupational History   Not on file  Tobacco Use   Smoking status: Never   Smokeless tobacco: Never  Vaping Use   Vaping status: Never Used  Substance and Sexual Activity   Alcohol  use: Yes    Comment: socially   Drug use: Never   Sexual activity: Not Currently    Partners: Male    Birth control/protection: Condom    Comment: intercourse age 37, more than 6 sexual parters,   Other Topics Concern   Not on file  Social History Narrative   Not on file   Social Drivers of Health   Financial Resource Strain: Low Risk  (07/04/2022)   Overall Financial Resource Strain (CARDIA)    Difficulty of Paying Living Expenses: Not very hard  Food Insecurity: No Food Insecurity (06/28/2022)   Hunger Vital  Sign    Worried About Running Out of Food in the Last Year: Never true    Ran Out of Food in the Last Year: Never true  Transportation Needs: No Transportation Needs (06/28/2022)   PRAPARE - Administrator, Civil Service (Medical): No    Lack of Transportation (Non-Medical): No  Physical Activity: Not on file  Stress: Not on file  Social Connections: Unknown (01/09/2023)   Received from [facility]   Social Connections    Frequency of Communication with Friends and Family: Not asked    Frequency of Social Gatherings with Friends and Family: Not asked  Intimate Partner Violence: Unknown (01/09/2023)   Received from [facility]   Intimate Partner Violence    Fear of Current or Ex-Partner: Not asked    Emotionally Abused: Not asked    Physically Abused: Not asked    Sexually Abused: Not asked    OBSERVATIONS/OBJECTIVE:  Physical Exam Constitutional:      Appearance: Normal appearance.  Cardiovascular:     Rate and Rhythm: Normal rate and regular rhythm.     Pulses: Normal pulses.     Heart sounds: Normal heart sounds.  Pulmonary:     Effort: Pulmonary effort is normal.     Breath sounds: Normal breath sounds.  Musculoskeletal:        General: No swelling. Normal range of motion.     Cervical back: Normal range of motion.  Lymphadenopathy:     Cervical: No cervical adenopathy.  Skin:    General: Skin is warm and dry.  Neurological:     General: No focal deficit present.     Mental Status: She is alert.    LABORATORY DATA:  None for this visit.  DIAGNOSTIC IMAGING:  None for this visit.   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 Assessment & Plan Chemotherapy regimen Currently on the seventh cycle of Xeloda , with the final  cycle scheduled to start on May 5th. - Schedule follow-up appointment in three weeks with labs. - Plan blood tests every six months post-chemotherapy Guardant reveal  for MRD testing to monitor for cancer recurrence.     Time spent: 30 min *Total Encounter Time as defined by the Centers for Medicare and Medicaid Services includes, in addition to the face-to-face time of a patient visit (documented in the note above) non-face-to-face time: obtaining and reviewing outside history, ordering and reviewing medications, tests or procedures, care coordination (communications with other health care professionals or caregivers) and documentation in the medical record.

## 2023-10-04 NOTE — Progress Notes (Signed)
 Oncology History  Malignant neoplasm of upper-outer quadrant of left female breast (HCC)  06/23/2022 Initial Diagnosis   Left breast needle core biopsy at 1:00, 10 cmfn: IDC< grade 3, 1.1 cm, ER 30% positive, weak staining intensity, PR negative, Ki-67 95%, HER2 negative.  Axillary node negative.    06/28/2022 Cancer Staging   Staging form: Breast, AJCC 8th Edition - Clinical stage from 06/28/2022: Stage IB (cT1c, cN0(f), cM0, G3, ER+, PR-, HER2-) - Signed by Bettejane Brownie, PA-C on 06/28/2022 Stage prefix: Initial diagnosis Method of lymph node assessment: Core biopsy Histologic grading system: 3 grade system   07/11/2022 Genetic Testing   Negative genetic testing on the 9 gene STAT panel.  The report date is July 05, 2022. Negative genetic testing on the Multi-cancer panel.  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  BLM c.3136G>A (p.Gly1046Ser), EGFR c.61G>A (p.Ala21Thr) and PDGFRA c.50G>T (p.Gly17Val) VUS identified.  The report date is July 11, 2022.  The STAT Breast cancer panel offered by [facility] includes sequencing and rearrangement analysis for the following 9 genes:  ATM, BRCA1, BRCA2, CDH1, CHEK2, PALB2, PTEN, STK11 and TP53.   The Multi-Cancer + RNA Panel offered by [facility] includes sequencing and/or deletion/duplication analysis of the following 70 genes:  AIP*, ALK, APC*, ATM*, AXIN2*, BAP1*, BARD1*, BLM*, BMPR1A*, BRCA1*, BRCA2*, BRIP1*, CDC73*, CDH1*, CDK4, CDKN1B*, CDKN2A, CHEK2*, CTNNA1*, DICER1*, EPCAM (del/dup only), EGFR, FH*, FLCN*, GREM1 (promoter dup only), HOXB13, KIT, LZTR1, MAX*, MBD4, MEN1*, MET, MITF, MLH1*, MSH2*, MSH3*, MSH6*, MUTYH*, NF1*, NF2*, NTHL1*, PALB2*, PDGFRA, PMS2*, POLD1*, POLE*, POT1*, PRKAR1A*, PTCH1*, PTEN*, RAD51C*, RAD51D*, RB1*, RET, SDHA* (sequencing only), SDHAF2*, SDHB*, SDHC*, SDHD*, SMAD4*, SMARCA4*, SMARCB1*, SMARCE1*, STK11*, SUFU*, TMEM127*, TP53*, TSC1*, TSC2*, VHL*. RNA analysis is performed for * genes.    07/20/2022 - 11/30/2022 Chemotherapy    Patient is on Treatment Plan : BREAST ADJUVANT DOSE DENSE AC q14d / PACLitaxel  q7d     07/21/2022 Procedure   Right breast needle core biopsy upper outer breast x 2: Negative for malignancy, left breast needle core biopsy lower: Negative for malignancy.   08/07/2022 Procedure   2 additional right breast needle core biopsies both negative for malignancy   01/04/2023 Surgery   Left breast lumpectomy: Invasive ductal carcinoma, 1.6 cm, grade 3, margins negative, 6 lymph nodes negative for malignancy.  Repeat prognostic panel: Triple negative breast cancer with a Ki67 of 90%.  Right breast lumpectomy: Negative for carcinoma.  Patient also underwent bilateral mammoplasties and path was negative for carcinoma on both specimen submitted.    - 03/29/2023 Radiation Therapy   Left breast: 40.05 Gy in 15 treatments "Boost": 10 cGy in 5 treatments    04/17/2023 -  Chemotherapy   Patient is on Treatment Plan : BREAST Capecitabine  q21d        INTERVAL HISTORY:  Ms. Weintraub is here for follow up.  Discussed the use of AI scribe software for clinical note transcription with the patient, who gave verbal consent to proceed. History of Present Illness Nicole Maddox is a 43 year old female with anemia who presents with fatigue. She continues to feel tired from xeloda .  She is excited that she will complete xeloda  this month. Last cycle due to start on Monday She continues to be anxious about recurrence given triple neg BC. She has intermittent night sweats, not having a proper menstrual cycle yet. No change in breathing Last guardant reveal in Jan neg. No change in bowel habits or urinary habits.  Rest of the pertinent 10 point ROS reviewed and  neg.  PAST MEDICAL/SURGICAL HISTORY:  Past Medical History:  Diagnosis Date   Breast cancer (HCC) 06/23/2022   Family history of adverse reaction to anesthesia    mother with h/o slow to wake   Family history of stomach cancer    GERD (gastroesophageal reflux  disease)    Hyperthyroidism    due to Graves' disease   Multinodular thyroid     Personal history of chemotherapy    Personal history of radiation therapy    Thyroid  disease    Past Surgical History:  Procedure Laterality Date   BREAST BIOPSY Left 06/23/2022   US  LT BREAST BX W LOC DEV 1ST LESION IMG BX SPEC US  GUIDE 06/23/2022 [facility]   BREAST BIOPSY Left 01/02/2023   US  LT RADIOACTIVE SEED LOC 01/02/2023 [facility]   BREAST BIOPSY  01/02/2023   MM RT RADIOACTIVE SEED LOC MAMMO GUIDE 01/02/2023 [facility]   BREAST LUMPECTOMY Left 01/04/2023   BREAST LUMPECTOMY WITH RADIOACTIVE SEED AND SENTINEL LYMPH NODE BIOPSY Left 01/04/2023   Procedure: LEFT BREAST LUMPECTOMY WITH RADIOACTIVE SEED AND SENTINEL LYMPH NODE BIOPSY;  Surgeon: Caralyn Chandler, MD;  Location: [facility] OR;  Service: General;  Laterality: Left;  PEC BLOCK   BREAST LUMPECTOMY WITH RADIOACTIVE SEED LOCALIZATION Right 01/04/2023   Procedure: RIGHT BREAST LUMPECTOMY WITH RADIOACTIVE SEED LOCALIZATION;  Surgeon: Caralyn Chandler, MD;  Location[facility];  Service: General;  Laterality: Right;   BREAST REDUCTION WITH MASTOPEXY Bilateral 01/04/2023   Procedure: BREAST REDUCTION WITH MASTOPEXY;  Surgeon: Thornell Flirt, DO;  Location: [facility] OR;  Service: Plastics;  Laterality: Bilateral;   PORT-A-CATH REMOVAL N/A 03/01/2023   Procedure: REMOVAL PORT-A-CATH;  Surgeon: Caralyn Chandler, MD;  Location: [facility];  Service: General;  Laterality: N/A;   PORTACATH PLACEMENT Right 07/19/2022   Procedure: INSERTION PORT-A-CATH;  Surgeon: Caralyn Chandler, MD;  Location:  SURGERY CENTER;  Service: General;  Laterality: Right;  60 MIN ROOM 8   WISDOM TOOTH EXTRACTION       ALLERGIES:  Allergies  Allergen Reactions   Adriamycin  [Doxorubicin ] Other (See Comments)    Patient c/o of lower back pain and right arm pain. See progress note from 07/20/22     CURRENT MEDICATIONS:  Outpatient Encounter  Medications as of 10/04/2023  Medication Sig   venlafaxine  XR (EFFEXOR -XR) 37.5 MG 24 hr capsule Take 2 capsules (75 mg total) by mouth daily with breakfast.   capecitabine  (XELODA ) 500 MG tablet Take 4 tablets (2,000 mg total) by mouth 2 (two) times daily after a meal. Take on days 1-14. Repeat every 21 days.   COD LIVER OIL PO Take by mouth daily.   Cyanocobalamin (VITAMIN B-12 PO) Take by mouth daily.   DENTA 5000 PLUS 1.1 % CREA dental cream Place 1 Application onto teeth daily.   diclofenac  Sodium (VOLTAREN ) 1 % GEL Research Patient: Apply 0.5 grams (1 fingertip) to each hand and each foot twice daily for up to 12 weeks   doxycycline  (VIBRA -TABS) 100 MG tablet Take 1 tablet (100 mg total) by mouth 2 (two) times daily.   folic acid  (FOLVITE ) 1 MG tablet Take 1 mg by mouth daily.   loratadine (CLARITIN) 10 MG tablet Take 10 mg by mouth daily.   metFORMIN (GLUCOPHAGE) 500 MG tablet    methimazole (TAPAZOLE) 5 MG tablet Take 15 mg by mouth 2 (two) times daily.   omeprazole (PRILOSEC) 40 MG capsule Take 40 mg by mouth daily.   [DISCONTINUED] Vitamin D ,  Ergocalciferol , (DRISDOL ) 1.25 MG (50000 UNIT) CAPS capsule Take 1 capsule (50,000 Units total) by mouth every 7 (seven) days.   No facility-administered encounter medications on file as of 10/04/2023.     ONCOLOGIC FAMILY HISTORY:  Family History  Problem Relation Age of Onset   Sleep apnea Mother    Obesity Mother    Heart attack Father 2   Heart Problems Maternal Grandmother    Stomach cancer Maternal Aunt        dx > 50   Throat cancer Maternal Uncle        dx 85s   Stomach cancer Maternal Uncle        dx > 50   Breast cancer Cousin        mother's maternal first cousin     SOCIAL HISTORY:  Social History   Socioeconomic History   Marital status: Single    Spouse name: Not on file   Number of children: Not on file   Years of education: Not on file   Highest education level: Not on file  Occupational History   Not on  file  Tobacco Use   Smoking status: Never   Smokeless tobacco: Never  Vaping Use   Vaping status: Never Used  Substance and Sexual Activity   Alcohol  use: Yes    Comment: socially   Drug use: Never   Sexual activity: Not Currently    Partners: Male    Birth control/protection: Condom    Comment: intercourse age 36, more than 6 sexual parters,   Other Topics Concern   Not on file  Social History Narrative   Not on file   Social Drivers of Health   Financial Resource Strain: Low Risk  (07/04/2022)   Overall Financial Resource Strain (CARDIA)    Difficulty of Paying Living Expenses: Not very hard  Food Insecurity: No Food Insecurity (06/28/2022)   Hunger Vital Sign    Worried About Running Out of Food in the Last Year: Never true    Ran Out of Food in the Last Year: Never true  Transportation Needs: No Transportation Needs (06/28/2022)   PRAPARE - Administrator, Civil Service (Medical): No    Lack of Transportation (Non-Medical): No  Physical Activity: Not on file  Stress: Not on file  Social Connections: Unknown (01/09/2023)   Received from [facility]   Social Connections    Frequency of Communication with Friends and Family: Not asked    Frequency of Social Gatherings with Friends and Family: Not asked  Intimate Partner Violence: Unknown (01/09/2023)   Received from [facility]   Intimate Partner Violence    Fear of Current or Ex-Partner: Not asked    Emotionally Abused: Not asked    Physically Abused: Not asked    Sexually Abused: Not asked    OBSERVATIONS/OBJECTIVE:  Physical Exam Constitutional:      Appearance: Normal appearance.  Cardiovascular:     Rate and Rhythm: Normal rate and regular rhythm.     Pulses: Normal pulses.     Heart sounds: Normal heart sounds.  Pulmonary:     Effort: Pulmonary effort is normal.     Breath sounds: Normal breath sounds.  Musculoskeletal:        General: No swelling. Normal range of motion.     Cervical back:  Normal range of motion.  Lymphadenopathy:     Cervical: No cervical adenopathy.  Skin:    General: Skin is warm and dry.  Neurological:  General: No focal deficit present.     Mental Status: She is alert.    LABORATORY DATA:  None for this visit.  DIAGNOSTIC IMAGING:  None for this visit.   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  Assessment and Plan Assessment & Plan   Chemotherapy regimen Currently on the seventh cycle of Xeloda , with the final cycle scheduled to start on May 5th. - Schedule follow-up appointment in 4 weeks with labs. - last Guardant reveal in Jan no detectable disease, repeat in 6 months.   Anemia Anemia likely secondary to Xeloda , contributing to fatigue and exertional dyspnea. Expected to improve post-chemotherapy. - Reassess anemia status post-chemotherapy.   Hand-foot syndrome Darkening of skin on hands and feet, a common side effect of Xeloda . This appears to improve since her last visit   Chemotherapy-induced nausea and vomiting Resolved  Depression/situational and vasomotor symptoms of menopause Will attempt effexor  37.5 mg po daily for a week and if tolerated, can increase to 75 mg po daily.  Weight gain/inability to lose weight Encouraged hydration, portion control, regular exercise. We can refer her to medical weight [facility] at [facility] if she still requires help.    Time spent: 30 min *Total Encounter Time as defined by the Centers for Medicare and Medicaid Services includes, in addition to the face-to-face time of a patient visit (documented in the note above) non-face-to-face time: obtaining and reviewing outside history, ordering and reviewing medications, tests or procedures, care coordination  (communications with other health care professionals or caregivers) and documentation in the medical record.

## 2023-10-08 ENCOUNTER — Encounter: Admitting: Rehabilitation

## 2023-10-11 ENCOUNTER — Encounter

## 2023-10-11 ENCOUNTER — Other Ambulatory Visit: Payer: Self-pay

## 2023-10-18 ENCOUNTER — Ambulatory Visit: Admitting: Bariatrics

## 2023-10-18 ENCOUNTER — Encounter: Payer: Self-pay | Admitting: Bariatrics

## 2023-10-18 VITALS — BP 132/79 | HR 103 | Temp 97.7°F | Ht 64.0 in | Wt 184.0 lb

## 2023-10-18 DIAGNOSIS — E88819 Insulin resistance, unspecified: Secondary | ICD-10-CM

## 2023-10-18 DIAGNOSIS — E6609 Other obesity due to excess calories: Secondary | ICD-10-CM

## 2023-10-18 DIAGNOSIS — E669 Obesity, unspecified: Secondary | ICD-10-CM

## 2023-10-18 DIAGNOSIS — Z6831 Body mass index (BMI) 31.0-31.9, adult: Secondary | ICD-10-CM | POA: Diagnosis not present

## 2023-10-18 NOTE — Progress Notes (Signed)
 WEIGHT SUMMARY AND BIOMETRICS  Weight Lost Since Last Visit: 4lb  Weight Gained Since Last Visit: 0   Vitals Temp: 97.7 F (36.5 C) BP: 132/79 Pulse Rate: (!) 103 SpO2: 99 %   Anthropometric Measurements Height: 5\' 4"  (1.626 m) Weight: 184 lb (83.5 kg) BMI (Calculated): 31.57 Weight at Last Visit: 188lb Weight Lost Since Last Visit: 4lb Weight Gained Since Last Visit: 0 Starting Weight: 187 Total Weight Loss (lbs): 3 lb (1.361 kg)   Body Composition  Body Fat %: 37.4 % Fat Mass (lbs): 69.2 lbs Muscle Mass (lbs): 109.8 lbs Total Body Water (lbs): 72 lbs Visceral Fat Rating : 8   Other Clinical Data Fasting: no Labs: no Today's Visit #: 5 Starting Date: 08/22/23    OBESITY Nicole Maddox is here to discuss her progress with her obesity treatment plan along with follow-up of her obesity related diagnoses.    Nutrition Plan: the Category 2 plan - 50% adherence.  Current exercise: walking  Interim History:  She is down 4 lbs since her last visit.  Eating all of the food on the plan., Protein intake is as prescribed, Is skipping meals, Water intake is adequate., and Denies polyphagia   Pharmacotherapy: Nicole Maddox is on Metformin 500 mg once daily breakfast Adverse side effects: None Hunger is moderately controlled.  Cravings are moderately controlled.  Assessment/Plan:   Insulin  Resistance Nicole Maddox has had elevated fasting insulin  readings. Goal is HgbA1c < 5.7, fasting insulin  at l0 or less, and preferably at 5.  She denies polyphagia. Medication(s): Metformin, but wants to switch to berberine Lab Results  Component Value Date   HGBA1C 5.3 08/22/2023   Lab Results  Component Value Date   INSULIN  14.0 08/22/2023    Plan Medication(s): Will switch to berberine 500 mg twice daily.  Will work on the agreed upon plan. Will minimize refined  carbohydrates ( sweets and starches), and focus more on complex carbohydrates.  Increase the micronutrients found in leafy greens, which include magnesium, polyphenols, and vitamin C which have been postulated to help with insulin  sensitivity. Minimize "fast food" and cook more meals at home.  Increase fiber to 25 to 30 grams daily.  She will prepare her meals at home.  She will increase her protein, and will have a protein shake if she does not eat her meals.   Generalized Obesity: Current BMI BMI (Calculated): 31.57   Pharmacotherapy Plan He has been taking metformin but wants to switch to berberine over-the-counter.  I told her I do not have any objections to her starting berberine if she does she can take 500 mg twice daily.  Will still need to continue having her blood sugars, insulin  level,  and A1c monitored periodically.  Nicole Maddox is currently in the action stage of change. As such, her goal is to continue with weight loss efforts.  She has agreed to the Category  2 plan.  Exercise goals: All adults should avoid inactivity. Some physical activity is better than none, and adults who participate in any amount of physical activity gain some health benefits. She is walking and will consider some weights.   Behavioral modification strategies: increasing lean protein intake, no meal skipping, meal planning , increase water intake, better snacking choices, planning for success, increasing vegetables, increasing fiber rich foods, keep healthy foods in the home, measure portion sizes, and mindful eating.  Nicole Maddox has agreed to follow-up with our clinic in 4 weeks.    Objective:   VITALS: Per patient if applicable, see vitals. GENERAL: Alert and in no acute distress. CARDIOPULMONARY: No increased WOB. Speaking in clear sentences.  PSYCH: Pleasant and cooperative. Speech normal rate and rhythm. Affect is appropriate. Insight and judgement are appropriate. Attention is focused, linear, and  appropriate.  NEURO: Oriented as arrived to appointment on time with no prompting.   Attestation Statements:   This was prepared with the assistance of Engineer, civil (consulting).  Occasional wrong-word or sound-a-like substitutions may have occurred due to the inherent limitations of voice recognition   Kirk Peper, DO

## 2023-11-01 ENCOUNTER — Telehealth: Payer: Self-pay

## 2023-11-01 NOTE — Telephone Encounter (Signed)
 Verbally confirmed appts for 5/30

## 2023-11-02 ENCOUNTER — Inpatient Hospital Stay

## 2023-11-02 ENCOUNTER — Inpatient Hospital Stay (HOSPITAL_BASED_OUTPATIENT_CLINIC_OR_DEPARTMENT_OTHER): Admitting: Hematology and Oncology

## 2023-11-02 VITALS — BP 133/79 | HR 99 | Temp 98.5°F | Resp 16 | Wt 188.0 lb

## 2023-11-02 DIAGNOSIS — C50412 Malignant neoplasm of upper-outer quadrant of left female breast: Secondary | ICD-10-CM

## 2023-11-02 DIAGNOSIS — Z170 Estrogen receptor positive status [ER+]: Secondary | ICD-10-CM

## 2023-11-02 LAB — CBC WITH DIFFERENTIAL/PLATELET
Abs Immature Granulocytes: 0.01 10*3/uL (ref 0.00–0.07)
Basophils Absolute: 0.1 10*3/uL (ref 0.0–0.1)
Basophils Relative: 2 %
Eosinophils Absolute: 0.3 10*3/uL (ref 0.0–0.5)
Eosinophils Relative: 6 %
HCT: 27.3 % — ABNORMAL LOW (ref 36.0–46.0)
Hemoglobin: 8.7 g/dL — ABNORMAL LOW (ref 12.0–15.0)
Immature Granulocytes: 0 %
Lymphocytes Relative: 29 %
Lymphs Abs: 1.1 10*3/uL (ref 0.7–4.0)
MCH: 24.7 pg — ABNORMAL LOW (ref 26.0–34.0)
MCHC: 31.9 g/dL (ref 30.0–36.0)
MCV: 77.6 fL — ABNORMAL LOW (ref 80.0–100.0)
Monocytes Absolute: 0.5 10*3/uL (ref 0.1–1.0)
Monocytes Relative: 12 %
Neutro Abs: 2 10*3/uL (ref 1.7–7.7)
Neutrophils Relative %: 51 %
Platelets: 283 10*3/uL (ref 150–400)
RBC: 3.52 MIL/uL — ABNORMAL LOW (ref 3.87–5.11)
RDW: 24.9 % — ABNORMAL HIGH (ref 11.5–15.5)
WBC: 3.9 10*3/uL — ABNORMAL LOW (ref 4.0–10.5)
nRBC: 0 % (ref 0.0–0.2)

## 2023-11-02 LAB — CMP (CANCER CENTER ONLY)
ALT: 19 U/L (ref 0–44)
AST: 23 U/L (ref 15–41)
Albumin: 4.3 g/dL (ref 3.5–5.0)
Alkaline Phosphatase: 65 U/L (ref 38–126)
Anion gap: 4 — ABNORMAL LOW (ref 5–15)
BUN: 8 mg/dL (ref 6–20)
CO2: 30 mmol/L (ref 22–32)
Calcium: 9 mg/dL (ref 8.9–10.3)
Chloride: 106 mmol/L (ref 98–111)
Creatinine: 0.82 mg/dL (ref 0.44–1.00)
GFR, Estimated: >60 mL/min (ref >60)
Glucose, Bld: 93 mg/dL (ref 70–99)
Potassium: 4.1 mmol/L (ref 3.5–5.1)
Sodium: 140 mmol/L (ref 135–145)
Total Bilirubin: 0.3 mg/dL (ref 0.0–1.2)
Total Protein: 7 g/dL (ref 6.5–8.1)

## 2023-11-02 LAB — TSH: TSH: 0.671 u[IU]/mL (ref 0.350–4.500)

## 2023-11-02 NOTE — Assessment & Plan Note (Signed)
 Assessment and Plan Assessment & Plan Breast cancer s/p neoadj KEYNOTE 522, RCB II, adj xeloda  Guardant reveal ctDNA not detected Continue Guardant reveal testing every 6 months.  Hypothyroidism Managed with methimazole 5 mg daily, resumed post-chemotherapy.  Depression Non-compliance with Effexor , taken twice last month. Encouraged regular use for emotional well-being. - Encourage regular use of Effexor  and consider setting reminders to improve compliance.  Moderate anemia. Likely related to recent treatment We will repeat labs in 8 weeks.  General Health Maintenance Guardant test due in July, MRI in August. - Proceed with MRI in August.

## 2023-11-02 NOTE — Progress Notes (Signed)
 Oncology History  Malignant neoplasm of upper-outer quadrant of left female breast (HCC)  06/23/2022 Initial Diagnosis   Left breast needle core biopsy at 1:00, 10 cmfn: IDC< grade 3, 1.1 cm, ER 30% positive, weak staining intensity, PR negative, Ki-67 95%, HER2 negative.  Axillary node negative.    06/28/2022 Cancer Staging   Staging form: Breast, AJCC 8th Edition - Clinical stage from 06/28/2022: Stage IB (cT1c, cN0(f), cM0, G3, ER+, PR-, HER2-) - Signed by Bettejane Brownie, PA-C on 06/28/2022 Stage prefix: Initial diagnosis Method of lymph node assessment: Core biopsy Histologic grading system: 3 grade system   07/11/2022 Genetic Testing   Negative genetic testing on the 9 gene STAT panel.  The report date is July 05, 2022. Negative genetic testing on the Multi-cancer panel.  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  BLM c.3136G>A (p.Gly1046Ser), EGFR c.61G>A (p.Ala21Thr) and PDGFRA c.50G>T (p.Gly17Val) VUS identified.  The report date is July 11, 2022.  The STAT Breast cancer panel offered by [facility] includes sequencing and rearrangement analysis for the following 9 genes:  ATM, BRCA1, BRCA2, CDH1, CHEK2, PALB2, PTEN, STK11 and TP53.   The Multi-Cancer + RNA Panel offered by [facility] includes sequencing and/or deletion/duplication analysis of the following 70 genes:  AIP*, ALK, APC*, ATM*, AXIN2*, BAP1*, BARD1*, BLM*, BMPR1A*, BRCA1*, BRCA2*, BRIP1*, CDC73*, CDH1*, CDK4, CDKN1B*, CDKN2A, CHEK2*, CTNNA1*, DICER1*, EPCAM (del/dup only), EGFR, FH*, FLCN*, GREM1 (promoter dup only), HOXB13, KIT, LZTR1, MAX*, MBD4, MEN1*, MET, MITF, MLH1*, MSH2*, MSH3*, MSH6*, MUTYH*, NF1*, NF2*, NTHL1*, PALB2*, PDGFRA, PMS2*, POLD1*, POLE*, POT1*, PRKAR1A*, PTCH1*, PTEN*, RAD51C*, RAD51D*, RB1*, RET, SDHA* (sequencing only), SDHAF2*, SDHB*, SDHC*, SDHD*, SMAD4*, SMARCA4*, SMARCB1*, SMARCE1*, STK11*, SUFU*, TMEM127*, TP53*, TSC1*, TSC2*, VHL*. RNA analysis is performed for * genes.    07/20/2022 - 11/30/2022 Chemotherapy    Patient is on Treatment Plan : BREAST ADJUVANT DOSE DENSE AC q14d / PACLitaxel  q7d     07/21/2022 Procedure   Right breast needle core biopsy upper outer breast x 2: Negative for malignancy, left breast needle core biopsy lower: Negative for malignancy.   08/07/2022 Procedure   2 additional right breast needle core biopsies both negative for malignancy   01/04/2023 Surgery   Left breast lumpectomy: Invasive ductal carcinoma, 1.6 cm, grade 3, margins negative, 6 lymph nodes negative for malignancy.  Repeat prognostic panel: Triple negative breast cancer with a Ki67 of 90%.  Right breast lumpectomy: Negative for carcinoma.  Patient also underwent bilateral mammoplasties and path was negative for carcinoma on both specimen submitted.    - 03/29/2023 Radiation Therapy   Left breast: 40.05 Gy in 15 treatments "Boost": 10 cGy in 5 treatments    04/17/2023 -  Chemotherapy   Patient is on Treatment Plan : BREAST Capecitabine  q21d        INTERVAL HISTORY:  Nicole Maddox is here for follow up.  Discussed the use of AI scribe software for clinical note transcription with the patient, who gave verbal consent to proceed. History of Present Illness Nicole Maddox is a 43 year old female for follow up.  She experiences intermittent shooting pain in both breasts and has noticed a palpable area on the side of her left breast in the axilla. She completed her last cycle of adjuvant Xeloda  on Oct 21, 2023, after eight cycles.  She reports vaginal dryness, which she attributes to temporary menopause, and occasionally experiences constipation. She describes bone pain, particularly when lying flat on her back, and notes intermittent fatigue.  Her hands and feet remain black and dry,  although they are getting lighter. Despite using oil and shea butter, her skin remains very dry.  Emotionally, she feels 'in the middle,' neither happy nor sad, and has not been consistently taking Effexor , having only taken it twice  in the last month. She wants to feel better emotionally.  She is taking vitamin D  once a week and methimazole 5 mg daily for her thyroid , which she had stopped temporarily after chemotherapy but has since resumed.  She plans to return to work in July, as her leave extends until the end of June. She expresses concern about potential arm swelling upon returning to work and mentions needing paperwork for her return.  Rest of the pertinent 10 point ROS reviewed and neg.  PAST MEDICAL/SURGICAL HISTORY:  Past Medical History:  Diagnosis Date   Breast cancer (HCC) 06/23/2022   Family history of adverse reaction to anesthesia    mother with h/o slow to wake   Family history of stomach cancer    GERD (gastroesophageal reflux disease)    Hyperthyroidism    due to Graves' disease   Multinodular thyroid     Personal history of chemotherapy    Personal history of radiation therapy    Thyroid  disease    Past Surgical History:  Procedure Laterality Date   BREAST BIOPSY Left 06/23/2022   US  LT BREAST BX W LOC DEV 1ST LESION IMG BX SPEC US  GUIDE 06/23/2022 [facility]   BREAST BIOPSY Left 01/02/2023   US  LT RADIOACTIVE SEED LOC 01/02/2023 [facility]   BREAST BIOPSY  01/02/2023   MM RT RADIOACTIVE SEED LOC MAMMO GUIDE 01/02/2023 [facility]   BREAST LUMPECTOMY Left 01/04/2023   BREAST LUMPECTOMY WITH RADIOACTIVE SEED AND SENTINEL LYMPH NODE BIOPSY Left 01/04/2023   Procedure: LEFT BREAST LUMPECTOMY WITH RADIOACTIVE SEED AND SENTINEL LYMPH NODE BIOPSY;  Surgeon: Caralyn Chandler, MD;  Location: [facility] OR;  Service: General;  Laterality: Left;  PEC BLOCK   BREAST LUMPECTOMY WITH RADIOACTIVE SEED LOCALIZATION Right 01/04/2023   Procedure: RIGHT BREAST LUMPECTOMY WITH RADIOACTIVE SEED LOCALIZATION;  Surgeon: Caralyn Chandler, MD;  Location: [facility];  Service: General;  Laterality: Right;   BREAST REDUCTION WITH MASTOPEXY Bilateral 01/04/2023   Procedure: BREAST REDUCTION WITH MASTOPEXY;   Surgeon: Thornell Flirt, DO;  Location: [facility] OR;  Service: Plastics;  Laterality: Bilateral;   PORT-A-CATH REMOVAL N/A 03/01/2023   Procedure: REMOVAL PORT-A-CATH;  Surgeon: Caralyn Chandler, MD;  Location: [facility];  Service: General;  Laterality: N/A;   PORTACATH PLACEMENT Right 07/19/2022   Procedure: INSERTION PORT-A-CATH;  Surgeon: Caralyn Chandler, MD;  Location: [facility];  Service: General;  Laterality: Right;  60 MIN ROOM 8   WISDOM TOOTH EXTRACTION       ALLERGIES:  Allergies  Allergen Reactions   Adriamycin  [Doxorubicin ] Other (See Comments)    Patient c/o of lower back pain and right arm pain. See progress note from 07/20/22     CURRENT MEDICATIONS:  Outpatient Encounter Medications as of 11/02/2023  Medication Sig   COD LIVER OIL PO Take by mouth daily.   Cyanocobalamin (VITAMIN B-12 PO) Take by mouth daily.   DENTA 5000 PLUS 1.1 % CREA dental cream Place 1 Application onto teeth daily.   diclofenac  Sodium (VOLTAREN ) 1 % GEL Research Patient: Apply 0.5 grams (1 fingertip) to each hand and each foot twice daily for up to 12 weeks   doxycycline  (VIBRA -TABS) 100 MG tablet Take 1 tablet (100 mg total) by mouth 2 (  two) times daily.   folic acid  (FOLVITE ) 1 MG tablet Take 1 mg by mouth daily.   loratadine (CLARITIN) 10 MG tablet Take 10 mg by mouth daily.   metFORMIN (GLUCOPHAGE) 500 MG tablet    methimazole (TAPAZOLE) 5 MG tablet Take 15 mg by mouth 2 (two) times daily.   omeprazole (PRILOSEC) 40 MG capsule Take 40 mg by mouth daily.   venlafaxine  XR (EFFEXOR -XR) 37.5 MG 24 hr capsule Take 2 capsules (75 mg total) by mouth daily with breakfast.   Vitamin D , Ergocalciferol , (DRISDOL ) 1.25 MG (50000 UNIT) CAPS capsule Take 1 capsule (50,000 Units total) by mouth every 7 (seven) days.   [DISCONTINUED] capecitabine  (XELODA ) 500 MG tablet Take 4 tablets (2,000 mg total) by mouth 2 (two) times daily after a meal. Take on days 1-14. Repeat every 21  days.   No facility-administered encounter medications on file as of 11/02/2023.     ONCOLOGIC FAMILY HISTORY:  Family History  Problem Relation Age of Onset   Sleep apnea Mother    Obesity Mother    Heart attack Father 63   Heart Problems Maternal Grandmother    Stomach cancer Maternal Aunt        dx > 50   Throat cancer Maternal Uncle        dx 50s   Stomach cancer Maternal Uncle        dx > 50   Breast cancer Cousin        mother's maternal first cousin     SOCIAL HISTORY:  Social History   Socioeconomic History   Marital status: Single    Spouse name: Not on file   Number of children: Not on file   Years of education: Not on file   Highest education level: Not on file  Occupational History   Not on file  Tobacco Use   Smoking status: Never   Smokeless tobacco: Never  Vaping Use   Vaping status: Never Used  Substance and Sexual Activity   Alcohol  use: Yes    Comment: socially   Drug use: Never   Sexual activity: Not Currently    Partners: Male    Birth control/protection: Condom    Comment: intercourse age 27, more than 6 sexual parters,   Other Topics Concern   Not on file  Social History Narrative   Not on file   Social Drivers of Health   Financial Resource Strain: Low Risk  (07/04/2022)   Overall Financial Resource Strain (CARDIA)    Difficulty of Paying Living Expenses: Not very hard  Food Insecurity: No Food Insecurity (06/28/2022)   Hunger Vital Sign    Worried About Running Out of Food in the Last Year: Never true    Ran Out of Food in the Last Year: Never true  Transportation Needs: No Transportation Needs (06/28/2022)   PRAPARE - Administrator, Civil Service (Medical): No    Lack of Transportation (Non-Medical): No  Physical Activity: Not on file  Stress: Not on file  Social Connections: Unknown (01/09/2023)   Received from [facility]   Social Connections    Frequency of Communication with Friends and Family: Not asked     Frequency of Social Gatherings with Friends and Family: Not asked  Intimate Partner Violence: Unknown (01/09/2023)   Received from [facility]   Intimate Partner Violence    Fear of Current or Ex-Partner: Not asked    Emotionally Abused: Not asked    Physically Abused: Not asked  Sexually Abused: Not asked    OBSERVATIONS/OBJECTIVE:  Physical Exam Constitutional:      Appearance: Normal appearance.  Cardiovascular:     Rate and Rhythm: Normal rate and regular rhythm.     Pulses: Normal pulses.     Heart sounds: Normal heart sounds.  Pulmonary:     Effort: Pulmonary effort is normal.     Breath sounds: Normal breath sounds.  Chest:     Comments: Left axillary seroma noted. Left breast post radiation changes. NO concerns for palpable masses or regional adenopathy Musculoskeletal:        General: No swelling. Normal range of motion.     Cervical back: Normal range of motion.  Lymphadenopathy:     Cervical: No cervical adenopathy.  Skin:    General: Skin is warm and dry.  Neurological:     General: No focal deficit present.     Mental Status: She is alert.    LABORATORY DATA:  None for this visit.  DIAGNOSTIC IMAGING:  None for this visit.   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  Assessment and Plan Assessment & Plan Breast pain Intermittent shooting pain likely due to hormonal changes.  Bone pain Achy bone pain likely from previous chemotherapy and hormonal changes.  Fatigue Intermittent fatigue likely multifactorial from chemotherapy and hormonal changes.  Vaginal dryness Persistent dryness due to chemotherapy-induced temporary menopause.  Skin hyperpigmentation and dryness Improving hyperpigmentation and  dryness.  Anemia Mild anemia post-chemotherapy. - Re-evaluate blood counts in two months.  Vitamin D  deficiency Deficiency noted in March, on weekly supplementation.  Hypothyroidism Managed with methimazole 5 mg daily, resumed post-chemotherapy.  Depression Non-compliance with Effexor , taken twice last month. Encouraged regular use for emotional well-being. - Encourage regular use of Effexor  and consider setting reminders to improve compliance.  General Health Maintenance Gardent test due in July, MRI in August. - Ensure Gardent test is completed in July. - Proceed with MRI in August.    Time spent: 30 min *Total Encounter Time as defined by the Centers for Medicare and Medicaid Services includes, in addition to the face-to-face time of a patient visit (documented in the note above) non-face-to-face time: obtaining and reviewing outside history, ordering and reviewing medications, tests or procedures, care coordination (communications with other health care professionals or caregivers) and documentation in the medical record.

## 2023-11-06 ENCOUNTER — Other Ambulatory Visit: Payer: Self-pay | Admitting: Bariatrics

## 2023-11-11 ENCOUNTER — Other Ambulatory Visit: Payer: Self-pay | Admitting: Hematology and Oncology

## 2023-11-12 ENCOUNTER — Encounter: Payer: Self-pay | Admitting: Hematology and Oncology

## 2023-11-15 ENCOUNTER — Telehealth: Payer: Self-pay | Admitting: *Deleted

## 2023-11-15 ENCOUNTER — Encounter: Payer: Self-pay | Admitting: Hematology and Oncology

## 2023-11-15 NOTE — Telephone Encounter (Signed)
 Completed [employer] Cancer Information paperwork   Sent to provider collaborative pick up bin for review, amend, sign and return to this nurse to return to claims benefit manager.

## 2023-11-19 ENCOUNTER — Encounter: Payer: Self-pay | Admitting: Bariatrics

## 2023-11-19 ENCOUNTER — Ambulatory Visit (INDEPENDENT_AMBULATORY_CARE_PROVIDER_SITE_OTHER): Admitting: Bariatrics

## 2023-11-19 VITALS — BP 137/78 | HR 78 | Temp 98.0°F | Ht 64.0 in | Wt 183.0 lb

## 2023-11-19 DIAGNOSIS — E559 Vitamin D deficiency, unspecified: Secondary | ICD-10-CM | POA: Diagnosis not present

## 2023-11-19 DIAGNOSIS — E669 Obesity, unspecified: Secondary | ICD-10-CM | POA: Diagnosis not present

## 2023-11-19 DIAGNOSIS — E88819 Insulin resistance, unspecified: Secondary | ICD-10-CM

## 2023-11-19 DIAGNOSIS — E6609 Other obesity due to excess calories: Secondary | ICD-10-CM

## 2023-11-19 DIAGNOSIS — Z6831 Body mass index (BMI) 31.0-31.9, adult: Secondary | ICD-10-CM | POA: Diagnosis not present

## 2023-11-19 MED ORDER — VITAMIN D (ERGOCALCIFEROL) 1.25 MG (50000 UNIT) PO CAPS: 50000.0000 [IU] | ORAL_CAPSULE | ORAL | 0 refills | Status: DC

## 2023-11-19 NOTE — Progress Notes (Signed)
 WEIGHT SUMMARY AND BIOMETRICS  Weight Lost Since Last Visit: 1lb  Weight Gained Since Last Visit: 0   Vitals Temp: 98 F (36.7 C) BP: 137/78 Pulse Rate: 78 SpO2: 100 %   Anthropometric Measurements Height: 5' 4 (1.626 m) Weight: 183 lb (83 kg) BMI (Calculated): 31.4 Weight at Last Visit: 184lb Weight Lost Since Last Visit: 1lb Weight Gained Since Last Visit: 0 Starting Weight: 187lb Total Weight Loss (lbs): 4 lb (1.814 kg)   Body Composition  Body Fat %: 37.8 % Fat Mass (lbs): 69.2 lbs Muscle Mass (lbs): 108 lbs Total Body Water (lbs): 72.6 lbs Visceral Fat Rating : 8   Other Clinical Data Fasting: no Labs: no Today's Visit #: 6 Starting Date: 08/22/23    OBESITY Nicole Maddox is here to discuss her progress with her obesity treatment plan along with follow-up of her obesity related diagnoses.    Nutrition Plan: the Category 2 plan - 50% adherence.  Current exercise: walking  Interim History:  She is down 1 lb since her last visit.  Eating all of the food on the plan., Protein intake is as prescribed, Is not skipping meals, and Water intake is adequate.  Hunger is moderately controlled.  Cravings are moderately controlled.  Assessment/Plan:   Vitamin D  Deficiency Vitamin D  is not at goal of 50.  Most recent vitamin D  level was 20.3. She is on  prescription ergocalciferol  50,000 IU weekly. Lab Results  Component Value Date   VD25OH 20.3 (L) 08/22/2023    Plan: Refill prescription vitamin D  50,000 IU weekly.   Insulin  Resistance Nicole Maddox has had elevated fasting insulin  readings. Goal is HgbA1c < 5.7, fasting insulin  at l0 or less, and preferably at 5.  She denies polyphagia. Medication(s): none Lab Results  Component Value Date   HGBA1C 5.3 08/22/2023   Lab Results  Component Value Date   INSULIN  14.0 08/22/2023     Plan Medication(s): none Will work on the agreed upon plan. Will minimize refined carbohydrates ( sweets and starches), and focus more on complex carbohydrates.  Increase the micronutrients found in leafy greens, which include magnesium, polyphenols, and vitamin C which have been postulated to help with insulin  sensitivity. She will continue to stay as active as possible doing both cardio and resistance. She will add in some HITT activities.  Increase fiber to 25 to 30 grams daily.     Generalized Obesity: Current BMI BMI (Calculated): 31.4    Nicole Maddox is currently in the action stage of change. As such, her goal is to continue with weight loss efforts.  She has agreed to the Category 2 plan.  Exercise goals: All adults should avoid inactivity. Some physical activity is better than none, and adults who participate in any amount of physical activity gain some health benefits.  Behavioral modification strategies: increasing lean protein intake, no meal skipping, meal planning , better  snacking choices, planning for success, increasing vegetables, increasing lower sugar fruits, decrease snacking , avoiding temptations, keep healthy foods in the home, and measure portion sizes.  Nicole Maddox has agreed to follow-up with our clinic in 4 weeks.    Objective:   VITALS: Per patient if applicable, see vitals. GENERAL: Alert and in no acute distress. CARDIOPULMONARY: No increased WOB. Speaking in clear sentences.  PSYCH: Pleasant and cooperative. Speech normal rate and rhythm. Affect is appropriate. Insight and judgement are appropriate. Attention is focused, linear, and appropriate.  NEURO: Oriented as arrived to appointment on time with no prompting.   Attestation Statements:   This was prepared with the assistance of Engineer, civil (consulting).  Occasional wrong-word or sound-a-like substitutions may have occurred due to the inherent limitations of voice recognition   Kirk Peper, DO

## 2023-11-26 ENCOUNTER — Ambulatory Visit: Attending: Hematology and Oncology

## 2023-11-26 VITALS — Wt 183.1 lb

## 2023-11-26 DIAGNOSIS — Z483 Aftercare following surgery for neoplasm: Secondary | ICD-10-CM | POA: Insufficient documentation

## 2023-11-26 NOTE — Therapy (Signed)
 OUTPATIENT PHYSICAL THERAPY SOZO SCREENING NOTE   Patient Name: Nicole Maddox MRN: [id number] DOB:11-11-1980, 43 y.o., female Today's Date: 11/26/2023  PCP: Felisa Reece SQUIBB, MD REFERRING PROVIDER: Loretha Ash, MD   PT End of Session - 11/26/23 [phone]     Visit Number 8   # unchanged due to screen only   PT Start Time 0926    PT Stop Time 0930    PT Time Calculation (min) 4 min    Activity Tolerance Patient tolerated treatment well    Behavior During Therapy [facility] for tasks assessed/performed          Past Medical History:  Diagnosis Date   Breast cancer (HCC) 06/23/2022   Family history of adverse reaction to anesthesia    mother with h/o slow to wake   Family history of stomach cancer    GERD (gastroesophageal reflux disease)    Hyperthyroidism    due to Graves' disease   Multinodular thyroid     Personal history of chemotherapy    Personal history of radiation therapy    Thyroid  disease    Past Surgical History:  Procedure Laterality Date   BREAST BIOPSY Left 06/23/2022   US  LT BREAST BX W LOC DEV 1ST LESION IMG BX SPEC US  GUIDE 06/23/2022 [facility]   BREAST BIOPSY Left 01/02/2023   US  LT RADIOACTIVE SEED LOC 01/02/2023 [facility]   BREAST BIOPSY  01/02/2023   MM RT RADIOACTIVE SEED LOC MAMMO GUIDE 01/02/2023 [facility]   BREAST LUMPECTOMY Left 01/04/2023   BREAST LUMPECTOMY WITH RADIOACTIVE SEED AND SENTINEL LYMPH NODE BIOPSY Left 01/04/2023   Procedure: LEFT BREAST LUMPECTOMY WITH RADIOACTIVE SEED AND SENTINEL LYMPH NODE BIOPSY;  Surgeon: Curvin Deward MOULD, MD;  Location: [facility] OR;  Service: General;  Laterality: Left;  PEC BLOCK   BREAST LUMPECTOMY WITH RADIOACTIVE SEED LOCALIZATION Right 01/04/2023   Procedure: RIGHT BREAST LUMPECTOMY WITH RADIOACTIVE SEED LOCALIZATION;  Surgeon: Curvin Deward MOULD, MD;  Location: [facility] OR;  Service: General;  Laterality: Right;   BREAST REDUCTION WITH MASTOPEXY Bilateral 01/04/2023   Procedure: BREAST REDUCTION  WITH MASTOPEXY;  Surgeon: Lowery Estefana GORMAN, DO;  Location: [facility] OR;  Service: Plastics;  Laterality: Bilateral;   PORT-A-CATH REMOVAL N/A 03/01/2023   Procedure: REMOVAL PORT-A-CATH;  Surgeon: Curvin Deward MOULD, MD;  Location: [facility];  Service: General;  Laterality: N/A;   PORTACATH PLACEMENT Right 07/19/2022   Procedure: INSERTION PORT-A-CATH;  Surgeon: Curvin Deward MOULD, MD;  Location: [facility];  Service: General;  Laterality: Right;  60 MIN ROOM 8   WISDOM TOOTH EXTRACTION     Patient Active Problem List   Diagnosis Date Noted   Elevated cholesterol 08/23/2023   Vitamin D  deficiency 08/08/2023   Port-A-Cath in place 07/20/2022   Genetic testing 07/07/2022   Family history of stomach cancer 06/29/2022   Malignant neoplasm of upper-outer quadrant of left female breast (HCC) 06/27/2022   Seborrheic dermatitis of scalp 02/25/2018    REFERRING DIAG: left breast cancer at risk for lymphedema  THERAPY DIAG: Aftercare following surgery for neoplasm  PERTINENT HISTORY: Lt IDC grade 3, triple neg. Completed chemotherapy. On 01/04/2023 pt had a Lt lumpectomy with 6 negative nodes. Completed radiation 03/29/23. On Capecitabine  (Xeloda ) chemo. Pt notices swelling in bilateral hands with walking which resolves  PRECAUTIONS: left UE Lymphedema risk, None  SUBJECTIVE: Pt returns for her 3 month L-Dex screen.   PAIN:  Are you having pain? No  SOZO SCREENING: Patient was assessed today using  the SOZO machine to determine the lymphedema index score. This was compared to her baseline score. It was determined that she is within the recommended range when compared to her baseline and no further action is needed at this time. She will continue SOZO screenings. These are done every 3 months for 2 years post operatively followed by every 6 months for 2 years, and then annually.   L-DEX FLOWSHEETS - 11/26/23 0900       L-DEX LYMPHEDEMA SCREENING   Measurement Type  Unilateral    L-DEX MEASUREMENT EXTREMITY Upper Extremity    POSITION  Standing    DOMINANT SIDE Right    At Risk Side Left    BASELINE SCORE (UNILATERAL) 0.4    L-DEX SCORE (UNILATERAL) 0.9    VALUE CHANGE (UNILAT) 0.5            Aden Berwyn Caldron, PTA 11/26/2023, 9:29 AM

## 2023-11-27 ENCOUNTER — Other Ambulatory Visit: Payer: Self-pay

## 2023-12-07 ENCOUNTER — Other Ambulatory Visit: Payer: Self-pay | Admitting: Bariatrics

## 2023-12-18 ENCOUNTER — Ambulatory Visit: Admitting: Bariatrics

## 2023-12-27 ENCOUNTER — Telehealth: Payer: Self-pay

## 2023-12-28 ENCOUNTER — Inpatient Hospital Stay: Attending: Hematology and Oncology

## 2023-12-28 ENCOUNTER — Inpatient Hospital Stay: Admitting: Hematology and Oncology

## 2023-12-28 ENCOUNTER — Other Ambulatory Visit: Payer: Self-pay | Admitting: *Deleted

## 2023-12-28 VITALS — BP 120/63 | HR 85 | Temp 98.5°F | Resp 18 | Ht 64.0 in | Wt 188.0 lb

## 2023-12-28 DIAGNOSIS — F32A Depression, unspecified: Secondary | ICD-10-CM | POA: Diagnosis not present

## 2023-12-28 DIAGNOSIS — Z79899 Other long term (current) drug therapy: Secondary | ICD-10-CM | POA: Insufficient documentation

## 2023-12-28 DIAGNOSIS — Z170 Estrogen receptor positive status [ER+]: Secondary | ICD-10-CM | POA: Insufficient documentation

## 2023-12-28 DIAGNOSIS — D509 Iron deficiency anemia, unspecified: Secondary | ICD-10-CM | POA: Insufficient documentation

## 2023-12-28 DIAGNOSIS — R5383 Other fatigue: Secondary | ICD-10-CM

## 2023-12-28 DIAGNOSIS — Z1722 Progesterone receptor negative status: Secondary | ICD-10-CM | POA: Insufficient documentation

## 2023-12-28 DIAGNOSIS — C50412 Malignant neoplasm of upper-outer quadrant of left female breast: Secondary | ICD-10-CM

## 2023-12-28 DIAGNOSIS — Z79620 Long term (current) use of immunosuppressive biologic: Secondary | ICD-10-CM | POA: Insufficient documentation

## 2023-12-28 DIAGNOSIS — K5900 Constipation, unspecified: Secondary | ICD-10-CM | POA: Diagnosis not present

## 2023-12-28 DIAGNOSIS — Z803 Family history of malignant neoplasm of breast: Secondary | ICD-10-CM | POA: Insufficient documentation

## 2023-12-28 DIAGNOSIS — Z1732 Human epidermal growth factor receptor 2 negative status: Secondary | ICD-10-CM | POA: Insufficient documentation

## 2023-12-28 DIAGNOSIS — Z808 Family history of malignant neoplasm of other organs or systems: Secondary | ICD-10-CM | POA: Diagnosis not present

## 2023-12-28 LAB — CBC WITH DIFFERENTIAL/PLATELET
Abs Immature Granulocytes: 0.01 K/uL (ref 0.00–0.07)
Basophils Absolute: 0.1 K/uL (ref 0.0–0.1)
Basophils Relative: 1 %
Eosinophils Absolute: 0.3 K/uL (ref 0.0–0.5)
Eosinophils Relative: 6 %
HCT: 29.5 % — ABNORMAL LOW (ref 36.0–46.0)
Hemoglobin: 8.8 g/dL — ABNORMAL LOW (ref 12.0–15.0)
Immature Granulocytes: 0 %
Lymphocytes Relative: 27 %
Lymphs Abs: 1.4 K/uL (ref 0.7–4.0)
MCH: 20.6 pg — ABNORMAL LOW (ref 26.0–34.0)
MCHC: 29.8 g/dL — ABNORMAL LOW (ref 30.0–36.0)
MCV: 69.1 fL — ABNORMAL LOW (ref 80.0–100.0)
Monocytes Absolute: 0.6 K/uL (ref 0.1–1.0)
Monocytes Relative: 11 %
Neutro Abs: 3 K/uL (ref 1.7–7.7)
Neutrophils Relative %: 55 %
Platelets: 321 K/uL (ref 150–400)
RBC: 4.27 MIL/uL (ref 3.87–5.11)
RDW: 23.5 % — ABNORMAL HIGH (ref 11.5–15.5)
WBC: 5.4 K/uL (ref 4.0–10.5)
nRBC: 0 % (ref 0.0–0.2)

## 2023-12-28 LAB — TSH: TSH: 0.079 u[IU]/mL — ABNORMAL LOW (ref 0.350–4.500)

## 2023-12-28 LAB — CMP (CANCER CENTER ONLY)
ALT: 16 U/L (ref 0–44)
AST: 18 U/L (ref 15–41)
Albumin: 4 g/dL (ref 3.5–5.0)
Alkaline Phosphatase: 59 U/L (ref 38–126)
Anion gap: 4 — ABNORMAL LOW (ref 5–15)
BUN: 8 mg/dL (ref 6–20)
CO2: 26 mmol/L (ref 22–32)
Calcium: 8.8 mg/dL — ABNORMAL LOW (ref 8.9–10.3)
Chloride: 107 mmol/L (ref 98–111)
Creatinine: 0.72 mg/dL (ref 0.44–1.00)
GFR, Estimated: >60 mL/min (ref >60)
Glucose, Bld: 81 mg/dL (ref 70–99)
Potassium: 3.6 mmol/L (ref 3.5–5.1)
Sodium: 137 mmol/L (ref 135–145)
Total Bilirubin: 0.2 mg/dL (ref 0.0–1.2)
Total Protein: 6.8 g/dL (ref 6.5–8.1)

## 2023-12-28 LAB — FERRITIN: Ferritin: 7 ng/mL — ABNORMAL LOW (ref 11–307)

## 2023-12-28 NOTE — Progress Notes (Signed)
 Oncology History  Malignant neoplasm of upper-outer quadrant of left female breast (HCC)  06/23/2022 Initial Diagnosis   Left breast needle core biopsy at 1:00, 10 cmfn: IDC< grade 3, 1.1 cm, ER 30% positive, weak staining intensity, PR negative, Ki-67 95%, HER2 negative.  Axillary node negative.    06/28/2022 Cancer Staging   Staging form: Breast, AJCC 8th Edition - Clinical stage from 06/28/2022: Stage IB (cT1c, cN0(f), cM0, G3, ER+, PR-, HER2-) - Signed by Lanell Donald Stagger, PA-C on 06/28/2022 Stage prefix: Initial diagnosis Method of lymph node assessment: Core biopsy Histologic grading system: 3 grade system   07/11/2022 Genetic Testing   Negative genetic testing on the 9 gene STAT panel.  The report date is July 05, 2022. Negative genetic testing on the Multi-cancer panel.  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  BLM c.3136G>A (p.Gly1046Ser), EGFR c.61G>A (p.Ala21Thr) and PDGFRA c.50G>T (p.Gly17Val) VUS identified.  The report date is July 11, 2022.  The STAT Breast cancer panel offered by [facility] includes sequencing and rearrangement analysis for the following 9 genes:  ATM, BRCA1, BRCA2, CDH1, CHEK2, PALB2, PTEN, STK11 and TP53.   The Multi-Cancer + RNA Panel offered by [facility] includes sequencing and/or deletion/duplication analysis of the following 70 genes:  AIP*, ALK, APC*, ATM*, AXIN2*, BAP1*, BARD1*, BLM*, BMPR1A*, BRCA1*, BRCA2*, BRIP1*, CDC73*, CDH1*, CDK4, CDKN1B*, CDKN2A, CHEK2*, CTNNA1*, DICER1*, EPCAM (del/dup only), EGFR, FH*, FLCN*, GREM1 (promoter dup only), HOXB13, KIT, LZTR1, MAX*, MBD4, MEN1*, MET, MITF, MLH1*, MSH2*, MSH3*, MSH6*, MUTYH*, NF1*, NF2*, NTHL1*, PALB2*, PDGFRA, PMS2*, POLD1*, POLE*, POT1*, PRKAR1A*, PTCH1*, PTEN*, RAD51C*, RAD51D*, RB1*, RET, SDHA* (sequencing only), SDHAF2*, SDHB*, SDHC*, SDHD*, SMAD4*, SMARCA4*, SMARCB1*, SMARCE1*, STK11*, SUFU*, TMEM127*, TP53*, TSC1*, TSC2*, VHL*. RNA analysis is performed for * genes.    07/20/2022 - 11/30/2022 Chemotherapy    Patient is on Treatment Plan : BREAST ADJUVANT DOSE DENSE AC q14d / PACLitaxel  q7d     07/21/2022 Procedure   Right breast needle core biopsy upper outer breast x 2: Negative for malignancy, left breast needle core biopsy lower: Negative for malignancy.   08/07/2022 Procedure   2 additional right breast needle core biopsies both negative for malignancy   01/04/2023 Surgery   Left breast lumpectomy: Invasive ductal carcinoma, 1.6 cm, grade 3, margins negative, 6 lymph nodes negative for malignancy.  Repeat prognostic panel: Triple negative breast cancer with a Ki67 of 90%.  Right breast lumpectomy: Negative for carcinoma.  Patient also underwent bilateral mammoplasties and path was negative for carcinoma on both specimen submitted.    - 03/29/2023 Radiation Therapy   Left breast: 40.05 Gy in 15 treatments "Boost": 10 cGy in 5 treatments    04/17/2023 - 04/17/2023 Chemotherapy   Patient is on Treatment Plan : BREAST Capecitabine  q21d        INTERVAL HISTORY:  Nicole Maddox is here for follow up.  Discussed the use of AI scribe software for clinical note transcription with the patient, who gave verbal consent to proceed. History of Present Illness  Nicole Maddox is a 43 year old female with a history of breast cancer who presents with chest soreness.  She has been experiencing soreness in the posterior chest wall area for about a week. The soreness is tender to the touch and worsens when lying on it. There is no history of recent physical activity, such as gardening or lifting weights, that could have caused the soreness. The pain is intermittent and has not improved over the past week.  She has a history of breast cancer and  recently underwent a cancer test, which was negative. She recalls that her breast was not painful during her initial cancer diagnosis.  She experiences ongoing constipation and fatigue. She is not currently menstruating, having had a cycle for a month or two after  finishing chemotherapy, but it has not returned since. No blood in stool or urine. She reports eating adequately, although her appetite is not large, eating once or twice a day.  She is currently taking Effexor  75 mg daily for depression and reports feeling balanced with no side effects. She is also on thyroid  medication, taking 5 mg every other day, which was adjusted from a previous dose of 15 mg.  Rest of the pertinent 10 point ROS reviewed and neg.  PAST MEDICAL/SURGICAL HISTORY:  Past Medical History:  Diagnosis Date   Breast cancer (HCC) 06/23/2022   Family history of adverse reaction to anesthesia    mother with h/o slow to wake   Family history of stomach cancer    GERD (gastroesophageal reflux disease)    Hyperthyroidism    due to Graves' disease   Multinodular thyroid     Personal history of chemotherapy    Personal history of radiation therapy    Thyroid  disease    Past Surgical History:  Procedure Laterality Date   BREAST BIOPSY Left 06/23/2022   US  LT BREAST BX W LOC DEV 1ST LESION IMG BX SPEC US  GUIDE 06/23/2022 [facility]   BREAST BIOPSY Left 01/02/2023   US  LT RADIOACTIVE SEED LOC 01/02/2023 [facility]   BREAST BIOPSY  01/02/2023   MM RT RADIOACTIVE SEED LOC MAMMO GUIDE 01/02/2023 [facility]   BREAST LUMPECTOMY Left 01/04/2023   BREAST LUMPECTOMY WITH RADIOACTIVE SEED AND SENTINEL LYMPH NODE BIOPSY Left 01/04/2023   Procedure: LEFT BREAST LUMPECTOMY WITH RADIOACTIVE SEED AND SENTINEL LYMPH NODE BIOPSY;  Surgeon: Curvin Deward MOULD, MD;  Location: [facility] OR;  Service: General;  Laterality: Left;  PEC BLOCK   BREAST LUMPECTOMY WITH RADIOACTIVE SEED LOCALIZATION Right 01/04/2023   Procedure: RIGHT BREAST LUMPECTOMY WITH RADIOACTIVE SEED LOCALIZATION;  Surgeon: Curvin Deward MOULD, MD;  Location[facility];  Service: General;  Laterality: Right;   BREAST REDUCTION WITH MASTOPEXY Bilateral 01/04/2023   Procedure: BREAST REDUCTION WITH MASTOPEXY;  Surgeon: Lowery Estefana RAMAN, DO;  Location: [facility] OR;  Service: Plastics;  Laterality: Bilateral;   PORT-A-CATH REMOVAL N/A 03/01/2023   Procedure: REMOVAL PORT-A-CATH;  Surgeon: Curvin Deward MOULD, MD;  Location: [facility];  Service: General;  Laterality: N/A;   PORTACATH PLACEMENT Right 07/19/2022   Procedure: INSERTION PORT-A-CATH;  Surgeon: Curvin Deward MOULD, MD;  Location: [facility];  Service: General;  Laterality: Right;  60 MIN ROOM 8   WISDOM TOOTH EXTRACTION       ALLERGIES:  Allergies  Allergen Reactions   Adriamycin  [Doxorubicin ] Other (See Comments)    Patient c/o of lower back pain and right arm pain. See progress note from 07/20/22     CURRENT MEDICATIONS:  Outpatient Encounter Medications as of 12/28/2023  Medication Sig   COD LIVER OIL PO Take by mouth daily.   Cyanocobalamin (VITAMIN B-12 PO) Take by mouth daily.   DENTA 5000 PLUS 1.1 % CREA dental cream Place 1 Application onto teeth daily.   diclofenac  Sodium (VOLTAREN ) 1 % GEL Research Patient: Apply 0.5 grams (1 fingertip) to each hand and each foot twice daily for up to 12 weeks   folic acid  (FOLVITE ) 1 MG tablet Take 1 mg by mouth daily.  loratadine (CLARITIN) 10 MG tablet Take 10 mg by mouth daily.   methimazole (TAPAZOLE) 5 MG tablet Take 5 mg by mouth every other day.   omeprazole (PRILOSEC) 40 MG capsule Take 40 mg by mouth daily.   venlafaxine  XR (EFFEXOR -XR) 37.5 MG 24 hr capsule TAKE 2 CAPSULES (75 MG TOTAL) BY MOUTH DAILY WITH BREAKFAST.   Vitamin D , Ergocalciferol , (DRISDOL ) 1.25 MG (50000 UNIT) CAPS capsule TAKE 1 CAPSULE (50,000 UNITS TOTAL) BY MOUTH EVERY 7 (SEVEN) DAYS   No facility-administered encounter medications on file as of 12/28/2023.     ONCOLOGIC FAMILY HISTORY:  Family History  Problem Relation Age of Onset   Sleep apnea Mother    Obesity Mother    Heart attack Father 43   Heart Problems Maternal Grandmother    Stomach cancer Maternal Aunt        dx > 50   Throat cancer  Maternal Uncle        dx 29s   Stomach cancer Maternal Uncle        dx > 50   Breast cancer Cousin        mother's maternal first cousin     SOCIAL HISTORY:  Social History   Socioeconomic History   Marital status: Single    Spouse name: Not on file   Number of children: Not on file   Years of education: Not on file   Highest education level: Not on file  Occupational History   Not on file  Tobacco Use   Smoking status: Never   Smokeless tobacco: Never  Vaping Use   Vaping status: Never Used  Substance and Sexual Activity   Alcohol  use: Yes    Comment: socially   Drug use: Never   Sexual activity: Not Currently    Partners: Male    Birth control/protection: Condom    Comment: intercourse age 24, more than 6 sexual parters,   Other Topics Concern   Not on file  Social History Narrative   Not on file   Social Drivers of Health   Financial Resource Strain: Low Risk  (07/04/2022)   Overall Financial Resource Strain (CARDIA)    Difficulty of Paying Living Expenses: Not very hard  Food Insecurity: No Food Insecurity (06/28/2022)   Hunger Vital Sign    Worried About Running Out of Food in the Last Year: Never true    Ran Out of Food in the Last Year: Never true  Transportation Needs: No Transportation Needs (06/28/2022)   PRAPARE - Administrator, Civil Service (Medical): No    Lack of Transportation (Non-Medical): No  Physical Activity: Not on file  Stress: Not on file  Social Connections: Unknown (01/09/2023)   Received from [facility]   Social Connections    Frequency of Communication with Friends and Family: Not asked    Frequency of Social Gatherings with Friends and Family: Not asked  Intimate Partner Violence: Unknown (01/09/2023)   Received from [facility]   Intimate Partner Violence    Fear of Current or Ex-Partner: Not asked    Emotionally Abused: Not asked    Physically Abused: Not asked    Sexually Abused: Not asked     OBSERVATIONS/OBJECTIVE:  Physical Exam Constitutional:      Appearance: Normal appearance.  Cardiovascular:     Rate and Rhythm: Normal rate and regular rhythm.     Pulses: Normal pulses.     Heart sounds: Normal heart sounds.  Pulmonary:  Effort: Pulmonary effort is normal.     Breath sounds: Normal breath sounds.  Chest:     Comments: Left axillary seroma noted. Left breast post radiation changes. NO concerns for palpable masses or regional adenopathy. No palpable masses for posterior chest wall. Musculoskeletal:        General: No swelling. Normal range of motion.     Cervical back: Normal range of motion.  Lymphadenopathy:     Cervical: No cervical adenopathy.  Skin:    General: Skin is warm and dry.  Neurological:     General: No focal deficit present.     Mental Status: She is alert.    LABORATORY DATA:  None for this visit.  DIAGNOSTIC IMAGING:  None for this visit.   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  Assessment & Plan  Breast cancer - Continue alternating MRI and mammogram every six months. - No concern for recurrence Guardant reveal, neg.  Iron deficiency anemia ? Iron deficiency anemia likely contributing to fatigue. Hemoglobin levels are slightly low. - Start iron supplementation with 65 mg iron pill every other day. - Order ferritin level to assess iron stores. - Schedule lab appointment in 8 weeks to evaluate response to iron supplementation.  Depression Depression is well-managed with current medication regimen. - Continue Effexor  75 mg daily.  Muscle tenderness in trapezius Pain is intermittent and exacerbated by pressure. - Recommend stretching exercises and massage for relief.   Time spent: 30  min *Total Encounter Time as defined by the Centers for Medicare and Medicaid Services includes, in addition to the face-to-face time of a patient visit (documented in the note above) non-face-to-face time: obtaining and reviewing outside history, ordering and reviewing medications, tests or procedures, care coordination (communications with other health care professionals or caregivers) and documentation in the medical record.

## 2023-12-31 ENCOUNTER — Ambulatory Visit: Payer: Self-pay | Admitting: Hematology and Oncology

## 2023-12-31 DIAGNOSIS — D509 Iron deficiency anemia, unspecified: Secondary | ICD-10-CM | POA: Insufficient documentation

## 2024-01-01 ENCOUNTER — Telehealth: Payer: Self-pay

## 2024-01-01 NOTE — Telephone Encounter (Signed)
 Dr. Loretha, patient will be scheduled as soon as possible.  Auth Submission: NO AUTH NEEDED Site of care: Site of care: [facility] Payer: [employer] commercial Medication & CPT/J Code(s) submitted: Venofer (Iron Sucrose) J1756 Diagnosis Code:  Route of submission (phone, fax, portal):  Phone # Fax # Auth type: Buy/Bill PB Units/visits requested: 300mg  x 3 doses Reference number:  Approval from: 01/01/24 to 05/03/24

## 2024-01-07 ENCOUNTER — Other Ambulatory Visit: Payer: Self-pay | Admitting: Hematology and Oncology

## 2024-01-07 ENCOUNTER — Ambulatory Visit: Admitting: *Deleted

## 2024-01-07 VITALS — BP 128/58 | HR 57 | Temp 97.7°F | Resp 16 | Ht 64.0 in | Wt 183.4 lb

## 2024-01-07 DIAGNOSIS — T454X5A Adverse effect of iron and its compounds, initial encounter: Secondary | ICD-10-CM

## 2024-01-07 DIAGNOSIS — D509 Iron deficiency anemia, unspecified: Secondary | ICD-10-CM

## 2024-01-07 MED ORDER — SODIUM CHLORIDE 0.9 % IV SOLN
300.0000 mg | Freq: Once | INTRAVENOUS | Status: AC
Start: 2024-01-07 — End: 2024-01-07
  Administered 2024-01-07: 300 mg via INTRAVENOUS
  Filled 2024-01-07: qty 15

## 2024-01-07 MED ORDER — EPINEPHRINE 0.3 MG/0.3ML IJ SOAJ: 0.3000 mg | Freq: Once | INTRAMUSCULAR | Status: DC | PRN

## 2024-01-07 MED ORDER — ALBUTEROL SULFATE HFA 108 (90 BASE) MCG/ACT IN AERS: 2.0000 | INHALATION_SPRAY | Freq: Once | RESPIRATORY_TRACT | Status: DC | PRN

## 2024-01-07 MED ORDER — SODIUM CHLORIDE 0.9 % IV SOLN: Freq: Once | INTRAVENOUS | Status: AC | PRN

## 2024-01-07 MED ORDER — METHYLPREDNISOLONE SODIUM SUCC 125 MG IJ SOLR
125.0000 mg | Freq: Once | INTRAMUSCULAR | Status: AC | PRN
  Administered 2024-01-07: 125 mg via INTRAVENOUS

## 2024-01-07 MED ORDER — FAMOTIDINE IN NACL 20-0.9 MG/50ML-% IV SOLN
20.0000 mg | Freq: Once | INTRAVENOUS | Status: AC | PRN
  Administered 2024-01-07: 20 mg via INTRAVENOUS

## 2024-01-07 MED ORDER — DIPHENHYDRAMINE HCL 50 MG/ML IJ SOLN
50.0000 mg | Freq: Once | INTRAMUSCULAR | Status: AC | PRN
  Administered 2024-01-07: 50 mg via INTRAVENOUS

## 2024-01-07 NOTE — Progress Notes (Signed)
 Diagnosis: Iron  Deficiency Anemia  Provider:  Mannam, Praveen MD  Procedure: IV Infusion  IV Type: Peripheral, IV Location: L Antecubital  Venofer  (Iron  Sucrose), Dose: 300 mg  Infusion Start Time: 0846 am ,   Infusion Stop Time: 1026  Post Infusion IV Care: Observation period completed and Peripheral IV Discontinued  After 25 minutes of observation, patient complained of bilateral feet  burning and feeling like pins and needles. Vital signs stable. Emergency protocols initiated. Benadryl  50 mg given at 1057 and Solumedrol 125 mg IV given at 1059. NS bolus started at 1101. Patient subsequently c/o burning sensation in chest; denied any shortness of breath or difficulty breathing. Pepcid  20 mg IVPB started at 1107.   Initially unable to reach patient's ordering provider. Secure message sent to Dr. Toribio Eartha Flavors, MD at 1057. No additional orders given. Able to reach ordering provider, Dr. Amber Stalls, MD, at 1109. Dr. Stalls also had no additional orders. Followed up with Dr. Stalls at 1134. At that time, patient stated that tingling sensation in right foot had improved, although other symptoms were unchanged. VSS. Dr. Stalls stated to continue to monitor for at least 15-20 more minutes and if patient continued to improve, ok to discharge to home. Relayed plan to patient, who verbalized understanding and agreement. Educated patient to go to Emergency Department for evaluation if she had any new or worsening symptoms.   After an additional 30 minutes of observation, patient stated that symptoms had resolved. Patient discharged to home. Per Dr. Stalls, kept patient's subsequent appointments.  Discharge: Condition: Good, Destination: Home . AVS Declined  Performed by:  Trudy Lamarr LABOR, RN

## 2024-01-07 NOTE — Progress Notes (Signed)
 Spoke with our infusion pharmacist Ginna They will add pepcid  20 mg IV and benadryl  50 mg IV premeds before the next 2 doses of venofer . Nicole Maddox

## 2024-01-18 ENCOUNTER — Ambulatory Visit (HOSPITAL_COMMUNITY)
Admission: RE | Admit: 2024-01-18 | Discharge: 2024-01-18 | Disposition: A | Discharge status: Home / Self Care | Source: Ambulatory Visit | Attending: Hematology and Oncology | Admitting: Hematology and Oncology

## 2024-01-18 ENCOUNTER — Ambulatory Visit (INDEPENDENT_AMBULATORY_CARE_PROVIDER_SITE_OTHER): Admitting: *Deleted

## 2024-01-18 VITALS — BP 116/77 | HR 90 | Temp 98.3°F | Resp 16 | Ht 64.0 in | Wt 187.2 lb

## 2024-01-18 DIAGNOSIS — C50412 Malignant neoplasm of upper-outer quadrant of left female breast: Secondary | ICD-10-CM | POA: Diagnosis present

## 2024-01-18 DIAGNOSIS — Z170 Estrogen receptor positive status [ER+]: Secondary | ICD-10-CM | POA: Diagnosis present

## 2024-01-18 DIAGNOSIS — D509 Iron deficiency anemia, unspecified: Secondary | ICD-10-CM

## 2024-01-18 MED ORDER — DIPHENHYDRAMINE HCL 50 MG/ML IJ SOLN
50.0000 mg | Freq: Once | INTRAMUSCULAR | Status: AC
  Administered 2024-01-18: 50 mg via INTRAVENOUS

## 2024-01-18 MED ORDER — GADOBUTROL 1 MMOL/ML IV SOLN
8.0000 mL | Freq: Once | INTRAVENOUS | Status: AC | PRN
  Administered 2024-01-18: 8 mL via INTRAVENOUS

## 2024-01-18 MED ORDER — FAMOTIDINE IN NACL 20-0.9 MG/50ML-% IV SOLN
20.0000 mg | Freq: Once | INTRAVENOUS | Status: AC
  Administered 2024-01-18: 20 mg via INTRAVENOUS

## 2024-01-18 MED ORDER — SODIUM CHLORIDE 0.9 % IV SOLN
300.0000 mg | Freq: Once | INTRAVENOUS | Status: AC
  Administered 2024-01-18: 300 mg via INTRAVENOUS
  Filled 2024-01-18 (×2): qty 15

## 2024-01-18 NOTE — Progress Notes (Signed)
Diagnosis: Iron Deficiency Anemia  Provider:  Chilton Greathouse MD  Procedure: IV Infusion  IV Type: Peripheral, IV Location: R Antecubital  Venofer (Iron Sucrose), Dose: 300 mg  Infusion Start Time: 1124 am  Infusion Stop Time: 1315 pm  Post Infusion IV Care: Observation period completed and Peripheral IV Discontinued  Discharge: Condition: Good, Destination: Home . AVS Declined  Performed by:  Forrest Moron, RN

## 2024-02-01 ENCOUNTER — Ambulatory Visit

## 2024-02-01 VITALS — BP 119/77 | HR 66 | Temp 98.8°F | Resp 16 | Ht 64.0 in | Wt 186.6 lb

## 2024-02-01 DIAGNOSIS — D509 Iron deficiency anemia, unspecified: Secondary | ICD-10-CM

## 2024-02-01 MED ORDER — SODIUM CHLORIDE 0.9 % IV SOLN
300.0000 mg | Freq: Once | INTRAVENOUS | Status: AC
  Administered 2024-02-01: 300 mg via INTRAVENOUS
  Filled 2024-02-01: qty 15

## 2024-02-01 MED ORDER — FAMOTIDINE IN NACL 20-0.9 MG/50ML-% IV SOLN
20.0000 mg | Freq: Once | INTRAVENOUS | Status: AC
  Administered 2024-02-01: 20 mg via INTRAVENOUS
  Filled 2024-02-01: qty 50

## 2024-02-01 MED ORDER — DIPHENHYDRAMINE HCL 50 MG/ML IJ SOLN
50.0000 mg | Freq: Once | INTRAMUSCULAR | Status: AC
  Administered 2024-02-01: 50 mg via INTRAVENOUS
  Filled 2024-02-01: qty 1

## 2024-02-01 NOTE — Progress Notes (Signed)
 Diagnosis: Iron  Deficiency Anemia  Provider:  Praveen Mannam MD  Procedure: IV Infusion  IV Type: Peripheral, IV Location: R Antecubital  Venofer  (Iron  Sucrose), Dose: 300 mg  Infusion Start Time: 1412  Infusion Stop Time: 1551  Post Infusion IV Care: Observation period completed and Peripheral IV Discontinued  Discharge: Condition: Good, Destination: Home . AVS Declined  Performed by:  Maximiano JONELLE Pouch, LPN

## 2024-02-06 ENCOUNTER — Ambulatory Visit: Admitting: Radiology

## 2024-02-20 ENCOUNTER — Encounter: Payer: Self-pay | Admitting: Radiology

## 2024-02-20 ENCOUNTER — Ambulatory Visit (INDEPENDENT_AMBULATORY_CARE_PROVIDER_SITE_OTHER): Admitting: Radiology

## 2024-02-20 ENCOUNTER — Inpatient Hospital Stay: Attending: Hematology and Oncology

## 2024-02-20 VITALS — BP 116/70 | Wt 187.0 lb

## 2024-02-20 DIAGNOSIS — Z1722 Progesterone receptor negative status: Secondary | ICD-10-CM | POA: Diagnosis not present

## 2024-02-20 DIAGNOSIS — Z1732 Human epidermal growth factor receptor 2 negative status: Secondary | ICD-10-CM | POA: Insufficient documentation

## 2024-02-20 DIAGNOSIS — Z30018 Encounter for initial prescription of other contraceptives: Secondary | ICD-10-CM | POA: Diagnosis not present

## 2024-02-20 DIAGNOSIS — Z170 Estrogen receptor positive status [ER+]: Secondary | ICD-10-CM | POA: Insufficient documentation

## 2024-02-20 DIAGNOSIS — N898 Other specified noninflammatory disorders of vagina: Secondary | ICD-10-CM

## 2024-02-20 DIAGNOSIS — C50412 Malignant neoplasm of upper-outer quadrant of left female breast: Secondary | ICD-10-CM | POA: Diagnosis present

## 2024-02-20 DIAGNOSIS — Z113 Encounter for screening for infections with a predominantly sexual mode of transmission: Secondary | ICD-10-CM | POA: Diagnosis not present

## 2024-02-20 DIAGNOSIS — Z79620 Long term (current) use of immunosuppressive biologic: Secondary | ICD-10-CM | POA: Diagnosis not present

## 2024-02-20 DIAGNOSIS — N941 Unspecified dyspareunia: Secondary | ICD-10-CM | POA: Diagnosis not present

## 2024-02-20 LAB — CBC WITH DIFFERENTIAL/PLATELET
Abs Immature Granulocytes: 0.03 K/uL (ref 0.00–0.07)
Basophils Absolute: 0.1 K/uL (ref 0.0–0.1)
Basophils Relative: 1 %
Eosinophils Absolute: 0.5 K/uL (ref 0.0–0.5)
Eosinophils Relative: 7 %
HCT: 34.5 % — ABNORMAL LOW (ref 36.0–46.0)
Hemoglobin: 10.8 g/dL — ABNORMAL LOW (ref 12.0–15.0)
Immature Granulocytes: 0 %
Lymphocytes Relative: 27 %
Lymphs Abs: 1.8 K/uL (ref 0.7–4.0)
MCH: 24 pg — ABNORMAL LOW (ref 26.0–34.0)
MCHC: 31.3 g/dL (ref 30.0–36.0)
MCV: 76.7 fL — ABNORMAL LOW (ref 80.0–100.0)
Monocytes Absolute: 0.7 K/uL (ref 0.1–1.0)
Monocytes Relative: 10 %
Neutro Abs: 3.7 K/uL (ref 1.7–7.7)
Neutrophils Relative %: 55 %
Platelets: 295 K/uL (ref 150–400)
RBC: 4.5 MIL/uL (ref 3.87–5.11)
RDW: 29.4 % — ABNORMAL HIGH (ref 11.5–15.5)
WBC: 6.7 K/uL (ref 4.0–10.5)
nRBC: 0 % (ref 0.0–0.2)

## 2024-02-20 LAB — CMP (CANCER CENTER ONLY)
ALT: 17 U/L (ref 0–44)
AST: 18 U/L (ref 15–41)
Albumin: 4.1 g/dL (ref 3.5–5.0)
Alkaline Phosphatase: 58 U/L (ref 38–126)
Anion gap: 6 (ref 5–15)
BUN: 7 mg/dL (ref 6–20)
CO2: 27 mmol/L (ref 22–32)
Calcium: 8.8 mg/dL — ABNORMAL LOW (ref 8.9–10.3)
Chloride: 106 mmol/L (ref 98–111)
Creatinine: 0.72 mg/dL (ref 0.44–1.00)
GFR, Estimated: >60 mL/min (ref >60)
Glucose, Bld: 94 mg/dL (ref 70–99)
Potassium: 4.1 mmol/L (ref 3.5–5.1)
Sodium: 139 mmol/L (ref 135–145)
Total Bilirubin: 0.1 mg/dL (ref 0.0–1.2)
Total Protein: 6.8 g/dL (ref 6.5–8.1)

## 2024-02-20 LAB — TSH: TSH: 0.338 u[IU]/mL — ABNORMAL LOW (ref 0.350–4.500)

## 2024-02-20 MED ORDER — PHEXXI 1.8-1-0.4 % VA GEL: 5.0000 g | VAGINAL | 11 refills | Status: AC

## 2024-02-20 MED ORDER — ESTRADIOL 10 MCG VA TABS: 1.0000 | ORAL_TABLET | VAGINAL | 0 refills | Status: DC

## 2024-02-20 NOTE — Progress Notes (Signed)
      Subjective: Nicole Maddox is a 43 y.o. female here for STI screen. Has a new partner, no symptoms. C/o pain and dryness with intercourse since being treated for breast cancer. Using condoms for birth control.   Review of Systems  All other systems reviewed and are negative.   Past Medical History:  Diagnosis Date   Breast cancer (HCC) 06/23/2022   Family history of adverse reaction to anesthesia    mother with h/o slow to wake   Family history of stomach cancer    GERD (gastroesophageal reflux disease)    Hyperthyroidism    due to Graves' disease   Multinodular thyroid     Personal history of chemotherapy    Personal history of radiation therapy    Thyroid  disease       Objective:  Today's Vitals   02/20/24 1049  BP: 116/70  Weight: 187 lb (84.8 kg)   Body mass index is 32.1 kg/m.   Physical Exam Vitals and nursing note reviewed. Exam conducted with a chaperone present.  Constitutional:      Appearance: Normal appearance. She is well-developed.  Pulmonary:     Effort: Pulmonary effort is normal.  Abdominal:     General: Abdomen is flat.     Palpations: Abdomen is soft.  Genitourinary:    General: Normal vulva.     Vagina: Vaginal discharge present. No erythema, bleeding or lesions.     Cervix: Normal. No discharge, friability, lesion or erythema.     Uterus: Normal.      Adnexa: Right adnexa normal and left adnexa normal.  Neurological:     Mental Status: She is alert.  Psychiatric:        Mood and Affect: Mood normal.        Thought Content: Thought content normal.        Judgment: Judgment normal.     Darice Hoit, CMA present for exam  Assessment:/Plan:   1. Screening for STDs (sexually transmitted diseases) (Primary) - SURESWAB CT/NG/T. vaginalis - HIV Antibody (routine testing w rflx) - RPR - Hepatitis C antibody  2. Dyspareunia in female - Estradiol  10 MCG TABS vaginal tablet; Place 1 tablet (10 mcg total) vaginally 2 (two) times a  week.  Dispense: 8 tablet; Refill: 0  3. Encounter for initial prescription of other contraceptives - Lactic Ac-Citric Ac-Pot Bitart (PHEXXI ) 1.8-1-0.4 % GEL; Place 5 g vaginally as directed. Before intercourse  Dispense: 120 g; Refill: 11    Will contact patient with results of testing completed today. Avoid intercourse until symptoms are resolved. Safe sex encouraged. Avoid the use of soaps or perfumed products in the peri area. Avoid tub baths and sitting in sweaty or wet clothing for prolonged periods of time.   Return for Annual last AEX 10/23.   Santiago Graf B, NP 11:02 AM

## 2024-02-21 LAB — HIV ANTIBODY (ROUTINE TESTING W REFLEX)
HIV 1&2 Ab, 4th Generation: NONREACTIVE
HIV FINAL INTERPRETATION: NEGATIVE

## 2024-02-21 LAB — HEPATITIS C ANTIBODY: Hepatitis C Ab: NONREACTIVE

## 2024-02-21 LAB — SURESWAB CT/NG/T. VAGINALIS
C. trachomatis RNA, TMA: NOT DETECTED
N. gonorrhoeae RNA, TMA: NOT DETECTED
Trichomonas vaginalis RNA: DETECTED — AB

## 2024-02-21 LAB — RPR: RPR Ser Ql: NONREACTIVE

## 2024-02-22 ENCOUNTER — Ambulatory Visit: Payer: Self-pay | Admitting: Radiology

## 2024-02-22 ENCOUNTER — Inpatient Hospital Stay: Admitting: Hematology and Oncology

## 2024-02-22 DIAGNOSIS — Z170 Estrogen receptor positive status [ER+]: Secondary | ICD-10-CM | POA: Diagnosis not present

## 2024-02-22 DIAGNOSIS — C50412 Malignant neoplasm of upper-outer quadrant of left female breast: Secondary | ICD-10-CM | POA: Diagnosis not present

## 2024-02-22 DIAGNOSIS — D509 Iron deficiency anemia, unspecified: Secondary | ICD-10-CM

## 2024-02-22 DIAGNOSIS — F32A Depression, unspecified: Secondary | ICD-10-CM

## 2024-02-22 DIAGNOSIS — Z1722 Progesterone receptor negative status: Secondary | ICD-10-CM

## 2024-02-22 DIAGNOSIS — Z1732 Human epidermal growth factor receptor 2 negative status: Secondary | ICD-10-CM | POA: Diagnosis not present

## 2024-02-22 MED ORDER — METRONIDAZOLE 500 MG PO TABS: 500.0000 mg | ORAL_TABLET | Freq: Two times a day (BID) | ORAL | 0 refills | Status: AC

## 2024-02-22 MED ORDER — TAMOXIFEN CITRATE 20 MG PO TABS: 20.0000 mg | ORAL_TABLET | Freq: Every day | ORAL | 3 refills | Status: DC

## 2024-02-22 NOTE — Progress Notes (Signed)
 Oncology History  Malignant neoplasm of upper-outer quadrant of left female breast (HCC)  06/23/2022 Initial Diagnosis   Left breast needle core biopsy at 1:00, 10 cmfn: IDC< grade 3, 1.1 cm, ER 30% positive, weak staining intensity, PR negative, Ki-67 95%, HER2 negative.  Axillary node negative.    06/28/2022 Cancer Staging   Staging form: Breast, AJCC 8th Edition - Clinical stage from 06/28/2022: Stage IB (cT1c, cN0(f), cM0, G3, ER+, PR-, HER2-) - Signed by Lanell Donald Stagger, PA-C on 06/28/2022 Stage prefix: Initial diagnosis Method of lymph node assessment: Core biopsy Histologic grading system: 3 grade system   07/11/2022 Genetic Testing   Negative genetic testing on the 9 gene STAT panel.  The report date is July 05, 2022. Negative genetic testing on the Multi-cancer panel.  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  BLM c.3136G>A (p.Gly1046Ser), EGFR c.61G>A (p.Ala21Thr) and PDGFRA c.50G>T (p.Gly17Val) VUS identified.  The report date is July 11, 2022.  The STAT Breast cancer panel offered by [facility] includes sequencing and rearrangement analysis for the following 9 genes:  ATM, BRCA1, BRCA2, CDH1, CHEK2, PALB2, PTEN, STK11 and TP53.   The Multi-Cancer + RNA Panel offered by [facility] includes sequencing and/or deletion/duplication analysis of the following 70 genes:  AIP*, ALK, APC*, ATM*, AXIN2*, BAP1*, BARD1*, BLM*, BMPR1A*, BRCA1*, BRCA2*, BRIP1*, CDC73*, CDH1*, CDK4, CDKN1B*, CDKN2A, CHEK2*, CTNNA1*, DICER1*, EPCAM (del/dup only), EGFR, FH*, FLCN*, GREM1 (promoter dup only), HOXB13, KIT, LZTR1, MAX*, MBD4, MEN1*, MET, MITF, MLH1*, MSH2*, MSH3*, MSH6*, MUTYH*, NF1*, NF2*, NTHL1*, PALB2*, PDGFRA, PMS2*, POLD1*, POLE*, POT1*, PRKAR1A*, PTCH1*, PTEN*, RAD51C*, RAD51D*, RB1*, RET, SDHA* (sequencing only), SDHAF2*, SDHB*, SDHC*, SDHD*, SMAD4*, SMARCA4*, SMARCB1*, SMARCE1*, STK11*, SUFU*, TMEM127*, TP53*, TSC1*, TSC2*, VHL*. RNA analysis is performed for * genes.    07/20/2022 - 11/30/2022 Chemotherapy    Patient is on Treatment Plan : BREAST ADJUVANT DOSE DENSE AC q14d / PACLitaxel  q7d     07/21/2022 Procedure   Right breast needle core biopsy upper outer breast x 2: Negative for malignancy, left breast needle core biopsy lower: Negative for malignancy.   08/07/2022 Procedure   2 additional right breast needle core biopsies both negative for malignancy   01/04/2023 Surgery   Left breast lumpectomy: Invasive ductal carcinoma, 1.6 cm, grade 3, margins negative, 6 lymph nodes negative for malignancy.  Repeat prognostic panel: Triple negative breast cancer with a Ki67 of 90%.  Right breast lumpectomy: Negative for carcinoma.  Patient also underwent bilateral mammoplasties and path was negative for carcinoma on both specimen submitted.    - 03/29/2023 Radiation Therapy   Left breast: 40.05 Gy in 15 treatments "Boost": 10 cGy in 5 treatments    04/17/2023 - 04/17/2023 Chemotherapy   Patient is on Treatment Plan : BREAST Capecitabine  q21d        INTERVAL HISTORY:   Ms Nicole Maddox is here for a follow up via telephone. She continues to report some fatigue, has been taking her methimazole on most days, but she missed some doses. She is working with endocrinology about this. She otherwise reports having great MRI result. She would like to continue alternating MRI and mammogram. She is interested in trying tamoxifen .  Rest of the pertinent 10 point ROS reviewed and neg.  PAST MEDICAL/SURGICAL HISTORY:  Past Medical History:  Diagnosis Date   Breast cancer (HCC) 06/23/2022   Family history of adverse reaction to anesthesia    mother with h/o slow to wake   Family history of stomach cancer    GERD (gastroesophageal reflux disease)    Hyperthyroidism  due to Graves' disease   Multinodular thyroid     Personal history of chemotherapy    Personal history of radiation therapy    Thyroid  disease    Past Surgical History:  Procedure Laterality Date   BREAST BIOPSY Left 06/23/2022   US  LT BREAST BX W  LOC DEV 1ST LESION IMG BX SPEC US  GUIDE 06/23/2022 [facility]   BREAST BIOPSY Left 01/02/2023   US  LT RADIOACTIVE SEED LOC 01/02/2023 [facility]   BREAST BIOPSY  01/02/2023   MM RT RADIOACTIVE SEED LOC MAMMO GUIDE 01/02/2023 [facility]   BREAST LUMPECTOMY Left 01/04/2023   BREAST LUMPECTOMY WITH RADIOACTIVE SEED AND SENTINEL LYMPH NODE BIOPSY Left 01/04/2023   Procedure: LEFT BREAST LUMPECTOMY WITH RADIOACTIVE SEED AND SENTINEL LYMPH NODE BIOPSY;  Surgeon: Curvin Deward MOULD, MD;  Location: [facility] OR;  Service: General;  Laterality: Left;  PEC BLOCK   BREAST LUMPECTOMY WITH RADIOACTIVE SEED LOCALIZATION Right 01/04/2023   Procedure: RIGHT BREAST LUMPECTOMY WITH RADIOACTIVE SEED LOCALIZATION;  Surgeon: Curvin Deward MOULD, MD;  Location: [facility];  Service: General;  Laterality: Right;   BREAST REDUCTION WITH MASTOPEXY Bilateral 01/04/2023   Procedure: BREAST REDUCTION WITH MASTOPEXY;  Surgeon: Lowery Estefana RAMAN, DO;  Location: [facility] OR;  Service: Plastics;  Laterality: Bilateral;   PORT-A-CATH REMOVAL N/A 03/01/2023   Procedure: REMOVAL PORT-A-CATH;  Surgeon: Curvin Deward MOULD, MD;  Location: [facility];  Service: General;  Laterality: N/A;   PORTACATH PLACEMENT Right 07/19/2022   Procedure: INSERTION PORT-A-CATH;  Surgeon: Curvin Deward MOULD, MD;  Location: [facility];  Service: General;  Laterality: Right;  60 MIN ROOM 8   WISDOM TOOTH EXTRACTION       ALLERGIES:  Allergies  Allergen Reactions   Adriamycin  [Doxorubicin ] Other (See Comments)    Patient c/o of lower back pain and right arm pain. See progress note from 07/20/22     CURRENT MEDICATIONS:  Outpatient Encounter Medications as of 02/22/2024  Medication Sig   COD LIVER OIL PO Take by mouth daily. (Patient not taking: Reported on 02/20/2024)   Cyanocobalamin (VITAMIN B-12 PO) Take by mouth daily. (Patient not taking: Reported on 02/20/2024)   DENTA 5000 PLUS 1.1 % CREA dental cream Place 1 Application  onto teeth daily. (Patient not taking: Reported on 02/20/2024)   diclofenac  Sodium (VOLTAREN ) 1 % GEL Research Patient: Apply 0.5 grams (1 fingertip) to each hand and each foot twice daily for up to 12 weeks   Estradiol  10 MCG TABS vaginal tablet Place 1 tablet (10 mcg total) vaginally 2 (two) times a week.   ferrous sulfate 325 (65 FE) MG EC tablet Take 1 tablet by mouth daily.   folic acid  (FOLVITE ) 1 MG tablet Take 1 mg by mouth daily. (Patient not taking: Reported on 02/20/2024)   Lactic Ac-Citric Ac-Pot Bitart (PHEXXI ) 1.8-1-0.4 % GEL Place 5 g vaginally as directed. Before intercourse   loratadine (CLARITIN) 10 MG tablet Take 10 mg by mouth daily.   methimazole (TAPAZOLE) 5 MG tablet Take 5 mg by mouth every other day.   omeprazole (PRILOSEC) 40 MG capsule Take 40 mg by mouth daily. (Patient not taking: Reported on 02/20/2024)   venlafaxine  XR (EFFEXOR -XR) 37.5 MG 24 hr capsule TAKE 2 CAPSULES (75 MG TOTAL) BY MOUTH DAILY WITH BREAKFAST.   Vitamin D , Ergocalciferol , (DRISDOL ) 1.25 MG (50000 UNIT) CAPS capsule TAKE 1 CAPSULE (50,000 UNITS TOTAL) BY MOUTH EVERY 7 (SEVEN) DAYS   No facility-administered encounter medications on file as of 02/22/2024.  ONCOLOGIC FAMILY HISTORY:  Family History  Problem Relation Age of Onset   Sleep apnea Mother    Obesity Mother    Heart attack Father 29   Heart Problems Maternal Grandmother    Stomach cancer Maternal Aunt        dx > 50   Throat cancer Maternal Uncle        dx 57s   Stomach cancer Maternal Uncle        dx > 50   Breast cancer Cousin        mother's maternal first cousin     SOCIAL HISTORY:  Social History   Socioeconomic History   Marital status: Single    Spouse name: Not on file   Number of children: Not on file   Years of education: Not on file   Highest education level: Not on file  Occupational History   Not on file  Tobacco Use   Smoking status: Never   Smokeless tobacco: Never  Vaping Use   Vaping status:  Never Used  Substance and Sexual Activity   Alcohol  use: Yes    Comment: socially   Drug use: Never   Sexual activity: Not Currently    Partners: Male    Birth control/protection: Condom    Comment: intercourse age 59, more than 6 sexual parters,   Other Topics Concern   Not on file  Social History Narrative   Not on file   Social Drivers of Health   Financial Resource Strain: Low Risk  (07/04/2022)   Overall Financial Resource Strain (CARDIA)    Difficulty of Paying Living Expenses: Not very hard  Food Insecurity: No Food Insecurity (06/28/2022)   Hunger Vital Sign    Worried About Running Out of Food in the Last Year: Never true    Ran Out of Food in the Last Year: Never true  Transportation Needs: No Transportation Needs (06/28/2022)   PRAPARE - Administrator, Civil Service (Medical): No    Lack of Transportation (Non-Medical): No  Physical Activity: Not on file  Stress: Not on file  Social Connections: Unknown (01/09/2023)   Received from [facility]   Social Connections    Frequency of Communication with Friends and Family: Not asked    Frequency of Social Gatherings with Friends and Family: Not asked  Intimate Partner Violence: Unknown (01/09/2023)   [facility]   Intimate Partner Violence    Fear of Current or Ex-Partner: Not asked    Emotionally Abused: Not asked    Physically Abused: Not asked    Sexually Abused: Not asked    OBSERVATIONS/OBJECTIVE:  Physical Exam Constitutional:      Appearance: Normal appearance.  Cardiovascular:     Rate and Rhythm: Normal rate and regular rhythm.     Pulses: Normal pulses.     Heart sounds: Normal heart sounds.  Pulmonary:     Effort: Pulmonary effort is normal.     Breath sounds: Normal breath sounds.  Chest:     Comments: Left axillary seroma noted. Left breast post radiation changes. NO concerns for palpable masses or regional adenopathy. No palpable masses for posterior chest  wall. Musculoskeletal:        General: No swelling. Normal range of motion.     Cervical back: Normal range of motion.  Lymphadenopathy:     Cervical: No cervical adenopathy.  Skin:    General: Skin is warm and dry.  Neurological:     General: No  focal deficit present.     Mental Status: She is alert.    LABORATORY DATA:  None for this visit.  DIAGNOSTIC IMAGING:  None for this visit.   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  Breast cancer - Continue alternating MRI and mammogram every six months. - Per pt Guardant reveal in July neg. - Most recent MRI breast negative. - Mammogram to be done in Feb    Iron  deficiency anemia ? Iron  deficiency anemia likely contributing to fatigue.  She received venofer  since her last visit here CBC today showed significant improvement in Hb,  Ok to continue oral iron  every other day FU with CBC, Iron  panel and ferritin at her next visit..   Depression Depression is well-managed with current medication regimen. - Continue Effexor  75 mg daily.  Since she had a weekly ER positive tumor on biopsy,we discussed role of tamoxifen . We have discussed options for antiestrogen therapy today  With regards to Tamoxifen , we discussed that this is a SERM, selective estrogen receptor modulator. We discussed mechanism of action of Tamoxifen , adverse effects on Tamoxifen  including but not limited to post menopausal symptoms, increased risk of DVT/PE, increased risk of endometrial cancer, questionable cataracts with long term use and increased risk of cardiovascular events in the study which was not statistically significant. A benefit from Tamoxifen  would be improvement in bone density. She is willing to try it, this has  been dispensed.    Time spent: 20 min  I connected with  Mayreli S Leavitt on 02/22/24 by a telephone application and verified that I am speaking with the correct person using two identifiers.   I discussed the limitations of evaluation and management by telemedicine. The patient expressed understanding and agreed to proceed.  Location of pt: Home Location of provider: office.  *Total Encounter Time as defined by the Centers for Medicare and Medicaid Services includes, in addition to the face-to-face time of a patient visit (documented in the note above) non-face-to-face time: obtaining and reviewing outside history, ordering and reviewing medications, tests or procedures, care coordination (communications with other health care professionals or caregivers) and documentation in the medical record.

## 2024-02-22 NOTE — Telephone Encounter (Signed)
 Spoke with pt she is aware of the results and expressed understanding. She states that she will do her TOC at her AEX appt which will be at the 6 wk mark.   Medication has been sent into the pts pharmacy.

## 2024-02-26 ENCOUNTER — Ambulatory Visit: Attending: Hematology and Oncology

## 2024-02-26 VITALS — Wt 188.0 lb

## 2024-02-26 DIAGNOSIS — Z483 Aftercare following surgery for neoplasm: Secondary | ICD-10-CM | POA: Insufficient documentation

## 2024-02-26 NOTE — Therapy (Signed)
 OUTPATIENT PHYSICAL THERAPY SOZO SCREENING NOTE   Patient Name: Nicole Maddox MRN: [id number] DOB:1980/08/15, 43 y.o., female Today's Date: 02/26/2024  PCP: Felisa Reece SQUIBB, MD REFERRING PROVIDER: Loretha Ash, MD   PT End of Session - 02/26/24 1108     Visit Number 8   # unchanged due to screen only   PT Start Time 1105    PT Stop Time 1110    PT Time Calculation (min) 5 min    Activity Tolerance Patient tolerated treatment well    Behavior During Therapy WFL for tasks assessed/performed          Past Medical History:  Diagnosis Date   Breast cancer (HCC) 06/23/2022   Family history of adverse reaction to anesthesia    mother with h/o slow to wake   Family history of stomach cancer    GERD (gastroesophageal reflux disease)    Hyperthyroidism    due to Graves' disease   Multinodular thyroid     Personal history of chemotherapy    Personal history of radiation therapy    Thyroid  disease    Past Surgical History:  Procedure Laterality Date   BREAST BIOPSY Left 06/23/2022   US  LT BREAST BX W LOC DEV 1ST LESION IMG BX SPEC US  GUIDE 06/23/2022 [facility]   BREAST BIOPSY Left 01/02/2023   US  LT RADIOACTIVE SEED LOC 01/02/2023 [facility]   BREAST BIOPSY  01/02/2023   MM RT RADIOACTIVE SEED LOC MAMMO GUIDE 01/02/2023 [facility]   BREAST LUMPECTOMY Left 01/04/2023   BREAST LUMPECTOMY WITH RADIOACTIVE SEED AND SENTINEL LYMPH NODE BIOPSY Left 01/04/2023   Procedure: LEFT BREAST LUMPECTOMY WITH RADIOACTIVE SEED AND SENTINEL LYMPH NODE BIOPSY;  Surgeon: Curvin Deward MOULD, MD;  Location: [facility] OR;  Service: General;  Laterality: Left;  PEC BLOCK   BREAST LUMPECTOMY WITH RADIOACTIVE SEED LOCALIZATION Right 01/04/2023   Procedure: RIGHT BREAST LUMPECTOMY WITH RADIOACTIVE SEED LOCALIZATION;  Surgeon: Curvin Deward MOULD, MD;  Location[facility] OR;  Service: General;  Laterality: Right;   BREAST REDUCTION WITH MASTOPEXY Bilateral 01/04/2023   Procedure: BREAST REDUCTION  WITH MASTOPEXY;  Surgeon: Lowery Estefana GORMAN, DO;  Location: [facility] OR;  Service: Plastics;  Laterality: Bilateral;   PORT-A-CATH REMOVAL N/A 03/01/2023   Procedure: REMOVAL PORT-A-CATH;  Surgeon: Curvin Deward MOULD, MD;  Location: [facility];  Service: General;  Laterality: N/A;   PORTACATH PLACEMENT Right 07/19/2022   Procedure: INSERTION PORT-A-CATH;  Surgeon: Curvin Deward MOULD, MD;  Location: [facility];  Service: General;  Laterality: Right;  60 MIN ROOM 8   WISDOM TOOTH EXTRACTION     Patient Active Problem List   Diagnosis Date Noted   IDA (iron  deficiency anemia) 12/31/2023   Elevated cholesterol 08/23/2023   Vitamin D  deficiency 08/08/2023   Port-A-Cath in place 07/20/2022   Genetic testing 07/07/2022   Family history of stomach cancer 06/29/2022   Malignant neoplasm of upper-outer quadrant of left female breast (HCC) 06/27/2022   Seborrheic dermatitis of scalp 02/25/2018    REFERRING DIAG: left breast cancer at risk for lymphedema  THERAPY DIAG: Aftercare following surgery for neoplasm  PERTINENT HISTORY: Lt IDC grade 3, triple neg. Completed chemotherapy. On 01/04/2023 pt had a Lt lumpectomy with 6 negative nodes. Completed radiation 03/29/23. On Capecitabine  (Xeloda ) chemo. Pt notices swelling in bilateral hands with walking which resolves  PRECAUTIONS: left UE Lymphedema risk, None  SUBJECTIVE: Pt returns for her 3 month L-Dex screen. I think I felt some cording at my chest the  other day but I can't feel it now.  PAIN:  Are you having pain? No  SOZO SCREENING: Patient was assessed today using the SOZO machine to determine the lymphedema index score. This was compared to her baseline score. It was determined that she is within the recommended range when compared to her baseline and no further action is needed at this time. She will continue SOZO screenings. These are done every 3 months for 2 years post operatively followed by every 6 months for 2  years, and then annually.  Reviewed importance of stretching throughout day, especially if she feels cording is returning. Pt verbalized understanding.    L-DEX FLOWSHEETS - 02/26/24 1100       L-DEX LYMPHEDEMA SCREENING   Measurement Type Unilateral    L-DEX MEASUREMENT EXTREMITY Upper Extremity    POSITION  Standing    DOMINANT SIDE Right    At Risk Side Left    BASELINE SCORE (UNILATERAL) 0.4    L-DEX SCORE (UNILATERAL) 3.9    VALUE CHANGE (UNILAT) 3.5            Aden Berwyn Caldron, PTA 02/26/2024, 11:09 AM

## 2024-03-13 ENCOUNTER — Other Ambulatory Visit: Payer: Self-pay | Admitting: Radiology

## 2024-03-13 DIAGNOSIS — N941 Unspecified dyspareunia: Secondary | ICD-10-CM

## 2024-03-13 NOTE — Telephone Encounter (Signed)
 Med refill request: Last AEX: 03/22/22 last OV 02/20/24 Next AEX: 04/01/24 Last MMG (if hormonal med) 07/19/23 Breast Density Cat 2 benign  Refill authorized: Pharmacy comment: REQUEST FOR 90 DAYS PRESCRIPTION. DX Code Needed. Last rx 02/21/24 #8 with 0 refills. Please approve or deny

## 2024-03-31 NOTE — Progress Notes (Unsigned)
   Nicole Maddox 09-09-1980 [id number]   History:  43 y.o. G0 presents for annual exam and TOC. + trich 02/20/24. Jan 2024 ER+ left breast cancer IDC managed with chemo, lumpectomy, radiation, Tamoxifen  (since 02/2024). Negative genetic testing. Not having menses since a chemo. Normal pap history. Graves disease, goiter managed by endo. Receiving iron  infusions. Effexor  for depression. Started vaginal estrogen in September.   Gynecologic History No LMP recorded.   Contraception/Family planning: {method:5051} Sexually active: ***  Health Maintenance Last Pap: 03/22/2022. Results were: Normal neg HPV Last mammogram: 01/18/2024 (MRI). Results were: Normal Last colonoscopy: ***. Results were:  Last Dexa: ***. Results were:      06/28/2022    3:50 PM  Depression screen PHQ 2/9  Decreased Interest 0  Down, Depressed, Hopeless 0  PHQ - 2 Score 0     Past medical history, past surgical history, family history and social history were all reviewed and documented in the EPIC chart.  ROS:  A ROS was performed and pertinent positives and negatives are included.  Exam:  There were no vitals filed for this visit. There is no height or weight on file to calculate BMI.  General appearance:  Normal Thyroid :  Symmetrical, normal in size, without palpable masses or nodularity. Respiratory  Auscultation:  Clear without wheezing or rhonchi Cardiovascular  Auscultation:  Regular rate, without rubs, murmurs or gallops  Edema/varicosities:  Not grossly evident Abdominal  Soft,nontender, without masses, guarding or rebound.  Liver/spleen:  No organomegaly noted  Hernia:  None appreciated  Skin  Inspection:  Grossly normal Breasts: Examined lying and sitting.   Right: Without masses, retractions, nipple discharge or axillary adenopathy.   Left: Without masses, retractions, nipple discharge or axillary adenopathy. Pelvic: External genitalia:  no lesions              Urethra:  normal appearing  urethra with no masses, tenderness or lesions              Bartholins and Skenes: normal                 Vagina: normal appearing vagina with normal color and discharge, no lesions              Cervix: no lesions Bimanual Exam:  Uterus:  no masses or tenderness              Adnexa: no mass, fullness, tenderness              Rectovaginal: Deferred              Anus:  normal, no lesions  Nicole Maddox, CMA present as chaperone.   Assessment/Plan:  43 y.o. G *** for annual exam.    No follow-ups on file.   Nicole DELENA Shutter DNP, 1:44 PM 03/31/2024

## 2024-04-01 ENCOUNTER — Encounter: Payer: Self-pay | Admitting: Nurse Practitioner

## 2024-04-01 ENCOUNTER — Other Ambulatory Visit (HOSPITAL_COMMUNITY)
Admission: RE | Admit: 2024-04-01 | Discharge: 2024-04-01 | Disposition: A | Discharge status: Home / Self Care | Source: Ambulatory Visit | Attending: Nurse Practitioner | Admitting: Nurse Practitioner

## 2024-04-01 ENCOUNTER — Ambulatory Visit (INDEPENDENT_AMBULATORY_CARE_PROVIDER_SITE_OTHER): Admitting: Nurse Practitioner

## 2024-04-01 VITALS — BP 116/76 | HR 79 | Ht 65.0 in | Wt 186.0 lb

## 2024-04-01 DIAGNOSIS — Z113 Encounter for screening for infections with a predominantly sexual mode of transmission: Secondary | ICD-10-CM | POA: Insufficient documentation

## 2024-04-01 DIAGNOSIS — Z01419 Encounter for gynecological examination (general) (routine) without abnormal findings: Secondary | ICD-10-CM | POA: Insufficient documentation

## 2024-04-01 DIAGNOSIS — Z1331 Encounter for screening for depression: Secondary | ICD-10-CM

## 2024-04-01 DIAGNOSIS — N911 Secondary amenorrhea: Secondary | ICD-10-CM

## 2024-04-01 DIAGNOSIS — C50412 Malignant neoplasm of upper-outer quadrant of left female breast: Secondary | ICD-10-CM | POA: Diagnosis not present

## 2024-04-01 DIAGNOSIS — Z124 Encounter for screening for malignant neoplasm of cervix: Secondary | ICD-10-CM

## 2024-04-01 DIAGNOSIS — N898 Other specified noninflammatory disorders of vagina: Secondary | ICD-10-CM

## 2024-04-01 DIAGNOSIS — N941 Unspecified dyspareunia: Secondary | ICD-10-CM

## 2024-04-01 DIAGNOSIS — Z170 Estrogen receptor positive status [ER+]: Secondary | ICD-10-CM

## 2024-04-01 DIAGNOSIS — N926 Irregular menstruation, unspecified: Secondary | ICD-10-CM | POA: Diagnosis not present

## 2024-04-02 ENCOUNTER — Ambulatory Visit: Payer: Self-pay | Admitting: Nurse Practitioner

## 2024-04-02 LAB — CYTOLOGY - PAP
Comment: NEGATIVE
Diagnosis: NEGATIVE
Trichomonas: NEGATIVE

## 2024-05-07 ENCOUNTER — Other Ambulatory Visit: Payer: Self-pay | Admitting: Hematology and Oncology

## 2024-05-08 ENCOUNTER — Telehealth: Payer: Self-pay | Admitting: *Deleted

## 2024-05-08 NOTE — Telephone Encounter (Signed)
 Completed accommodation paperwork   Sent to provider to review, amend, sign and return to this nurse to return to claims benefit manager.

## 2024-05-12 ENCOUNTER — Ambulatory Visit: Attending: Hematology and Oncology

## 2024-05-12 VITALS — Wt 186.0 lb

## 2024-05-12 DIAGNOSIS — Z483 Aftercare following surgery for neoplasm: Secondary | ICD-10-CM | POA: Insufficient documentation

## 2024-05-12 NOTE — Therapy (Signed)
 OUTPATIENT PHYSICAL THERAPY SOZO SCREENING NOTE   Patient Name: Nicole Maddox MRN: [id number] DOB:[birth year], 43 y.o., female Today's Date: 05/12/2024  PCP: Felisa Reece SQUIBB, MD REFERRING PROVIDER: Loretha Ash, MD   PT End of Session - 05/12/24 [phone]     Visit Number 8   # unchanged due to screen only   PT Start Time 0832    PT Stop Time 0836    PT Time Calculation (min) 4 min    Activity Tolerance Patient tolerated treatment well    Behavior During Therapy [facility] for tasks assessed/performed          Past Medical History:  Diagnosis Date   Breast cancer (HCC) 06/23/2022   Family history of adverse reaction to anesthesia    mother with h/o slow to wake   Family history of stomach cancer    GERD (gastroesophageal reflux disease)    Hyperthyroidism    due to Graves' disease   Multinodular thyroid     Personal history of chemotherapy    Personal history of radiation therapy    Thyroid  disease    Past Surgical History:  Procedure Laterality Date   BREAST BIOPSY Left 06/23/2022   US  LT BREAST BX W LOC DEV 1ST LESION IMG BX SPEC US  GUIDE 06/23/2022 [facility]   BREAST BIOPSY Left 01/02/2023   US  LT RADIOACTIVE SEED LOC 01/02/2023 [facility]   BREAST BIOPSY  01/02/2023   MM RT RADIOACTIVE SEED LOC MAMMO GUIDE 01/02/2023 [facility]   BREAST LUMPECTOMY Left 01/04/2023   BREAST LUMPECTOMY WITH RADIOACTIVE SEED AND SENTINEL LYMPH NODE BIOPSY Left 01/04/2023   Procedure: LEFT BREAST LUMPECTOMY WITH RADIOACTIVE SEED AND SENTINEL LYMPH NODE BIOPSY;  Surgeon: Curvin Deward MOULD, MD;  Location: [facility];  Service: General;  Laterality: Left;  PEC BLOCK   BREAST LUMPECTOMY WITH RADIOACTIVE SEED LOCALIZATION Right 01/04/2023   Procedure: RIGHT BREAST LUMPECTOMY WITH RADIOACTIVE SEED LOCALIZATION;  Surgeon: Curvin Deward MOULD, MD;  Location[facility] OR;  Service: General;  Laterality: Right;   BREAST REDUCTION WITH MASTOPEXY Bilateral 01/04/2023   Procedure: BREAST REDUCTION  WITH MASTOPEXY;  Surgeon: Lowery Estefana GORMAN, DO;  Location: [facility] OR;  Service: Plastics;  Laterality: Bilateral;   PORT-A-CATH REMOVAL N/A 03/01/2023   Procedure: REMOVAL PORT-A-CATH;  Surgeon: Curvin Deward MOULD, MD;  Location: [facility];  Service: General;  Laterality: N/A;   PORTACATH PLACEMENT Right 07/19/2022   Procedure: INSERTION PORT-A-CATH;  Surgeon: Curvin Deward MOULD, MD;  Location: [facility];  Service: General;  Laterality: Right;  60 MIN ROOM 8   WISDOM TOOTH EXTRACTION     Patient Active Problem List   Diagnosis Date Noted   IDA (iron  deficiency anemia) 12/31/2023   Elevated cholesterol 08/23/2023   Vitamin D  deficiency 08/08/2023   Port-A-Cath in place 07/20/2022   Genetic testing 07/07/2022   Family history of stomach cancer 06/29/2022   Malignant neoplasm of upper-outer quadrant of left female breast (HCC) 06/27/2022   Seborrheic dermatitis of scalp 02/25/2018    REFERRING DIAG: left breast cancer at risk for lymphedema  THERAPY DIAG: Aftercare following surgery for neoplasm  PERTINENT HISTORY: Lt IDC grade 3, triple neg. Completed chemotherapy. On 01/04/2023 pt had a Lt lumpectomy with 6 negative nodes. Completed radiation 03/29/23. On Capecitabine  (Xeloda ) chemo. Pt notices swelling in bilateral hands with walking which resolves  PRECAUTIONS: left UE Lymphedema risk, None  SUBJECTIVE: Pt returns for her 3 month L-Dex screen.   PAIN:  Are you having pain? No  SOZO SCREENING: Patient was assessed today using the SOZO machine to determine the lymphedema index score. This was compared to her baseline score. It was determined that she is within the recommended range when compared to her baseline and no further action is needed at this time. She will continue SOZO screenings. These are done every 3 months for 2 years post operatively followed by every 6 months for 2 years, and then annually.    L-DEX FLOWSHEETS - 05/12/24 0800       L-DEX  LYMPHEDEMA SCREENING   Measurement Type Unilateral    L-DEX MEASUREMENT EXTREMITY Upper Extremity    POSITION  Standing    DOMINANT SIDE Right    At Risk Side Left    BASELINE SCORE (UNILATERAL) 0.4    L-DEX SCORE (UNILATERAL) 5.8    VALUE CHANGE (UNILAT) 5.4         P:    Aden Berwyn Caldron, PTA 05/12/2024, 8:36 AM

## 2024-05-15 NOTE — Telephone Encounter (Signed)
 05/12/2024 Late Entry:   Completed [employer] paperwork sent to provider to review, make any needed amendments, sign and return to form staff.   Signed paperwork returned to this nurse.   Successfully returned via fax.   Faxed for medical records to (SW) HIM.   Copy to [facility] HIM bin designated for items to be scanned to EMR.   Prepared paperwork to be mailed to address on file.   [address] [place] [address]  Process completed with no further instructions received, actions performed or required by this nurse.

## 2024-05-19 ENCOUNTER — Telehealth: Payer: Self-pay | Admitting: Hematology and Oncology

## 2024-05-19 NOTE — Telephone Encounter (Signed)
 I spoken with patient regarding her 06/30/24 appt being rescheduled to1/27/26. Patient is aware of new appt date and time.

## 2024-06-01 ENCOUNTER — Other Ambulatory Visit: Payer: Self-pay | Admitting: Radiology

## 2024-06-01 DIAGNOSIS — N941 Unspecified dyspareunia: Secondary | ICD-10-CM

## 2024-06-02 NOTE — Telephone Encounter (Signed)
 Med refill request:   Estradiol  (VAGIFEM ) 10 MCG TABS vaginal tablet  Start:  03/13/24 Disp:  24 tablets Refills:  0  Last AEX:  04/01/24 Next AEX:  Not yet scheduled Last MMG (if hormonal med):  07/19/23 Refill authorized? Please Advise.

## 2024-06-18 NOTE — Telephone Encounter (Signed)
 Spoke to pt regarding scheduling appt

## 2024-06-30 ENCOUNTER — Other Ambulatory Visit

## 2024-06-30 ENCOUNTER — Ambulatory Visit: Admitting: Hematology and Oncology

## 2024-07-01 ENCOUNTER — Inpatient Hospital Stay: Admitting: Hematology and Oncology

## 2024-07-01 ENCOUNTER — Inpatient Hospital Stay: Attending: Hematology and Oncology

## 2024-07-01 DIAGNOSIS — Z1722 Progesterone receptor negative status: Secondary | ICD-10-CM | POA: Insufficient documentation

## 2024-07-01 DIAGNOSIS — Z808 Family history of malignant neoplasm of other organs or systems: Secondary | ICD-10-CM | POA: Diagnosis not present

## 2024-07-01 DIAGNOSIS — Z800 Family history of malignant neoplasm of digestive organs: Secondary | ICD-10-CM | POA: Diagnosis not present

## 2024-07-01 DIAGNOSIS — Z171 Estrogen receptor negative status [ER-]: Secondary | ICD-10-CM | POA: Insufficient documentation

## 2024-07-01 DIAGNOSIS — Z9221 Personal history of antineoplastic chemotherapy: Secondary | ICD-10-CM | POA: Diagnosis not present

## 2024-07-01 DIAGNOSIS — C50412 Malignant neoplasm of upper-outer quadrant of left female breast: Secondary | ICD-10-CM | POA: Insufficient documentation

## 2024-07-01 DIAGNOSIS — Z803 Family history of malignant neoplasm of breast: Secondary | ICD-10-CM | POA: Insufficient documentation

## 2024-07-01 DIAGNOSIS — Z79899 Other long term (current) drug therapy: Secondary | ICD-10-CM | POA: Diagnosis not present

## 2024-07-01 DIAGNOSIS — Z170 Estrogen receptor positive status [ER+]: Secondary | ICD-10-CM | POA: Diagnosis not present

## 2024-07-01 DIAGNOSIS — D509 Iron deficiency anemia, unspecified: Secondary | ICD-10-CM | POA: Diagnosis not present

## 2024-07-01 DIAGNOSIS — Z1732 Human epidermal growth factor receptor 2 negative status: Secondary | ICD-10-CM | POA: Diagnosis not present

## 2024-07-01 DIAGNOSIS — F419 Anxiety disorder, unspecified: Secondary | ICD-10-CM | POA: Diagnosis not present

## 2024-07-01 DIAGNOSIS — Z923 Personal history of irradiation: Secondary | ICD-10-CM | POA: Insufficient documentation

## 2024-07-01 LAB — CBC WITH DIFFERENTIAL/PLATELET
Abs Immature Granulocytes: 0.02 10*3/uL (ref 0.00–0.07)
Basophils Absolute: 0 10*3/uL (ref 0.0–0.1)
Basophils Relative: 1 %
Eosinophils Absolute: 0.3 10*3/uL (ref 0.0–0.5)
Eosinophils Relative: 5 %
HCT: 27.6 % — ABNORMAL LOW (ref 36.0–46.0)
Hemoglobin: 8.6 g/dL — ABNORMAL LOW (ref 12.0–15.0)
Immature Granulocytes: 0 %
Lymphocytes Relative: 30 %
Lymphs Abs: 1.6 10*3/uL (ref 0.7–4.0)
MCH: 21.6 pg — ABNORMAL LOW (ref 26.0–34.0)
MCHC: 31.2 g/dL (ref 30.0–36.0)
MCV: 69.3 fL — ABNORMAL LOW (ref 80.0–100.0)
Monocytes Absolute: 0.3 10*3/uL (ref 0.1–1.0)
Monocytes Relative: 6 %
Neutro Abs: 3.1 10*3/uL (ref 1.7–7.7)
Neutrophils Relative %: 58 %
Platelets: 328 10*3/uL (ref 150–400)
RBC: 3.98 MIL/uL (ref 3.87–5.11)
RDW: 18 % — ABNORMAL HIGH (ref 11.5–15.5)
WBC: 5.4 10*3/uL (ref 4.0–10.5)
nRBC: 0 % (ref 0.0–0.2)

## 2024-07-01 LAB — CMP (CANCER CENTER ONLY)
ALT: 17 U/L (ref 0–44)
AST: 23 U/L (ref 15–41)
Albumin: 4.5 g/dL (ref 3.5–5.0)
Alkaline Phosphatase: 64 U/L (ref 38–126)
Anion gap: 14 (ref 5–15)
BUN: 6 mg/dL (ref 6–20)
CO2: 22 mmol/L (ref 22–32)
Calcium: 8.4 mg/dL — ABNORMAL LOW (ref 8.9–10.3)
Chloride: 102 mmol/L (ref 98–111)
Creatinine: 0.65 mg/dL (ref 0.44–1.00)
GFR, Estimated: >60 mL/min
Glucose, Bld: 75 mg/dL (ref 70–99)
Potassium: 3.6 mmol/L (ref 3.5–5.1)
Sodium: 137 mmol/L (ref 135–145)
Total Bilirubin: 0.3 mg/dL (ref 0.0–1.2)
Total Protein: 7.4 g/dL (ref 6.5–8.1)

## 2024-07-01 LAB — FERRITIN: Ferritin: 9 ng/mL — ABNORMAL LOW (ref 11–307)

## 2024-07-01 LAB — IRON AND IRON BINDING CAPACITY (CC-WL,HP ONLY)
Iron: 12 ug/dL — ABNORMAL LOW (ref 28–170)
Saturation Ratios: 3 % — ABNORMAL LOW (ref 10.4–31.8)
TIBC: 431 ug/dL (ref 250–450)
UIBC: 419 ug/dL

## 2024-07-01 MED ORDER — TAMOXIFEN CITRATE 20 MG PO TABS: 20.0000 mg | ORAL_TABLET | Freq: Every day | ORAL | 3 refills | Status: AC

## 2024-07-01 MED ORDER — VENLAFAXINE HCL ER 37.5 MG PO CP24: 75.0000 mg | ORAL_CAPSULE | Freq: Every day | ORAL | 1 refills | Status: AC

## 2024-07-01 NOTE — Progress Notes (Signed)
 " Oncology History  Malignant neoplasm of upper-outer quadrant of left female breast (HCC)  06/23/2022 Initial Diagnosis   Left breast needle core biopsy at 1:00, 10 cmfn: IDC< grade 3, 1.1 cm, ER 30% positive, weak staining intensity, PR negative, Ki-67 95%, HER2 negative.  Axillary node negative.    06/28/2022 Cancer Staging   Staging form: Breast, AJCC 8th Edition - Clinical stage from 06/28/2022: Stage IB (cT1c, cN0(f), cM0, G3, ER+, PR-, HER2-) - Signed by Lanell Donald Stagger, PA-C on 06/28/2022 Stage prefix: Initial diagnosis Method of lymph node assessment: Core biopsy Histologic grading system: 3 grade system   07/11/2022 Genetic Testing   Negative genetic testing on the 9 gene STAT panel.  The report date is July 05, 2022. Negative genetic testing on the Multi-cancer panel.  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  BLM c.3136G>A (p.Gly1046Ser), EGFR c.61G>A (p.Ala21Thr) and PDGFRA c.50G>T (p.Gly17Val) VUS identified.  The report date is July 11, 2022.  The STAT Breast cancer panel offered by [facility] includes sequencing and rearrangement analysis for the following 9 genes:  ATM, BRCA1, BRCA2, CDH1, CHEK2, PALB2, PTEN, STK11 and TP53.   The Multi-Cancer + RNA Panel offered by [facility] includes sequencing and/or deletion/duplication analysis of the following 70 genes:  AIP*, ALK, APC*, ATM*, AXIN2*, BAP1*, BARD1*, BLM*, BMPR1A*, BRCA1*, BRCA2*, BRIP1*, CDC73*, CDH1*, CDK4, CDKN1B*, CDKN2A, CHEK2*, CTNNA1*, DICER1*, EPCAM (del/dup only), EGFR, FH*, FLCN*, GREM1 (promoter dup only), HOXB13, KIT, LZTR1, MAX*, MBD4, MEN1*, MET, MITF, MLH1*, MSH2*, MSH3*, MSH6*, MUTYH*, NF1*, NF2*, NTHL1*, PALB2*, PDGFRA, PMS2*, POLD1*, POLE*, POT1*, PRKAR1A*, PTCH1*, PTEN*, RAD51C*, RAD51D*, RB1*, RET, SDHA* (sequencing only), SDHAF2*, SDHB*, SDHC*, SDHD*, SMAD4*, SMARCA4*, SMARCB1*, SMARCE1*, STK11*, SUFU*, TMEM127*, TP53*, TSC1*, TSC2*, VHL*. RNA analysis is performed for * genes.    07/20/2022 - 11/30/2022 Chemotherapy    Patient is on Treatment Plan : BREAST ADJUVANT DOSE DENSE AC q14d / PACLitaxel  q7d     07/21/2022 Procedure   Right breast needle core biopsy upper outer breast x 2: Negative for malignancy, left breast needle core biopsy lower: Negative for malignancy.   08/07/2022 Procedure   2 additional right breast needle core biopsies both negative for malignancy   01/04/2023 Surgery   Left breast lumpectomy: Invasive ductal carcinoma, 1.6 cm, grade 3, margins negative, 6 lymph nodes negative for malignancy.  Repeat prognostic panel: Triple negative breast cancer with a Ki67 of 90%.  Right breast lumpectomy: Negative for carcinoma.  Patient also underwent bilateral mammoplasties and path was negative for carcinoma on both specimen submitted.    - 03/29/2023 Radiation Therapy   Left breast: 40.05 Gy in 15 treatments Boost: 10 cGy in 5 treatments    04/17/2023 - 04/17/2023 Chemotherapy   Patient is on Treatment Plan : BREAST Capecitabine  q21d        INTERVAL HISTORY:   Discussed the use of AI scribe software for clinical note transcription with the patient, who gave verbal consent to proceed.  History of Present Illness Nicole Maddox is a 44 year old female with estrogen receptor weakly positive, functionally triple negative left breast cancer presenting for routine oncology follow-up and management of iron  deficiency anemia.  She remains in surveillance for left breast cancer, alternating breast MRI and mammogram every six months. The most recent MRI was performed in August 2025, with the next mammogram scheduled for February 2026. She has not experienced new or concerning breast symptoms. She notes decreased sweating and lack of odor on the radiated side.  She has not taken tamoxifen  or Effexor  for the past month  due to a recent move, resulting in increased anxiety, fatigue, constipation, intermittent mild pains, and insomnia. She has also not taken iron  supplementation during this period. She  continues to experience intermittent headaches, managed with acetaminophen , without associated diplopia or neurological deficits. She reports persistent constipation and irregular menstruation, but denies dyspnea, urinary symptoms, or severe pain. Her hemoglobin is 8.6, and she prefers to resume oral iron  supplementation rather than pursue intravenous iron  therapy.  She describes ongoing anxiety, both situational and related to concerns about cancer recurrence. She is not currently on pharmacologic therapy for anxiety but has initiated sessions with a health coach and participates in a support group for individuals with triple negative breast cancer to address fatigue and psychosocial impact. She inquired about red light therapy for cancer. She prefers phlebotomy at a facility due to a prior negative experience with home phlebotomy and has only one arm suitable for venipuncture.   Rest of the pertinent 10 point ROS reviewed and neg.  PAST MEDICAL/SURGICAL HISTORY:  Past Medical History:  Diagnosis Date   Breast cancer (HCC) 06/23/2022   Family history of adverse reaction to anesthesia    mother with h/o slow to wake   Family history of stomach cancer    GERD (gastroesophageal reflux disease)    Hyperthyroidism    due to Graves' disease   Multinodular thyroid     Personal history of chemotherapy    Personal history of radiation therapy    Thyroid  disease    Past Surgical History:  Procedure Laterality Date   BREAST BIOPSY Left 06/23/2022   US  LT BREAST BX W LOC DEV 1ST LESION IMG BX SPEC US  GUIDE 06/23/2022 [facility]MO[facility]   BREAST BIOPSY Left 01/02/2023   US  LT RADIOACTIVE SEED LOC 01/02/2023 [facility]MO[facility]   BREAST BIOPSY  01/02/2023   MM RT RADIOACTIVE SEED LOC MAMMO GUIDE 01/02/2023 [facility]   BREAST LUMPECTOMY Left 01/04/2023   BREAST LUMPECTOMY WITH RADIOACTIVE SEED AND SENTINEL LYMPH NODE BIOPSY Left 01/04/2023   Procedure: LEFT BREAST LUMPECTOMY WITH RADIOACTIVE  SEED AND SENTINEL LYMPH NODE BIOPSY;  Surgeon: Curvin Deward MOULD, MD;  Location: [facility] OR;  Service: General;  Laterality: Left;  PEC BLOCK   BREAST LUMPECTOMY WITH RADIOACTIVE SEED LOCALIZATION Right 01/04/2023   Procedure: RIGHT BREAST LUMPECTOMY WITH RADIOACTIVE SEED LOCALIZATION;  Surgeon: Curvin Deward MOULD, MD;  Location: [facility] OR;  Service: General;  Laterality: Right;   BREAST REDUCTION WITH MASTOPEXY Bilateral 01/04/2023   Procedure: BREAST REDUCTION WITH MASTOPEXY;  Surgeon: Lowery Estefana RAMAN, DO;  Location: [facility] OR;  Service: Plastics;  Laterality: Bilateral;   PORT-A-CATH REMOVAL N/A 03/01/2023   Procedure: REMOVAL PORT-A-CATH;  Surgeon: Curvin Deward MOULD, MD;  Location: [facility];  Service: General;  Laterality: N/A;   PORTACATH PLACEMENT Right 07/19/2022   Procedure: INSERTION PORT-A-CATH;  Surgeon: Curvin Deward MOULD, MD;  Location: [facility];  Service: General;  Laterality: Right;  60 MIN ROOM 8   WISDOM TOOTH EXTRACTION       ALLERGIES:  Allergies  Allergen Reactions   Adriamycin  [Doxorubicin ] Other (See Comments)    Patient c/o of lower back pain and right arm pain. See progress note from 07/20/22     CURRENT MEDICATIONS:  Outpatient Encounter Medications as of 07/01/2024  Medication Sig   COD LIVER OIL PO Take by mouth daily. (Patient not taking: Reported on 04/01/2024)   Cyanocobalamin (VITAMIN B-12 PO) Take by mouth daily. (Patient not taking: Reported on 04/01/2024)   DENTA 5000 PLUS  1.1 % CREA dental cream Place 1 Application onto teeth daily. (Patient not taking: Reported on 04/01/2024)   diclofenac  Sodium (VOLTAREN ) 1 % GEL Research Patient: Apply 0.5 grams (1 fingertip) to each hand and each foot twice daily for up to 12 weeks   ferrous sulfate 325 (65 FE) MG EC tablet Take 1 tablet by mouth daily.   folic acid  (FOLVITE ) 1 MG tablet Take 1 mg by mouth daily.   Lactic Ac-Citric Ac-Pot Bitart (PHEXXI ) 1.8-1-0.4 % GEL Place 5 g vaginally as directed.  Before intercourse (Patient not taking: Reported on 04/01/2024)   loratadine (CLARITIN) 10 MG tablet Take 10 mg by mouth daily.   methimazole (TAPAZOLE) 5 MG tablet Take 5 mg by mouth every other day.   metroNIDAZOLE  (FLAGYL ) 500 MG tablet Take 1 tablet (500 mg total) by mouth 2 (two) times daily. (Patient not taking: Reported on 04/01/2024)   omeprazole (PRILOSEC) 40 MG capsule Take 40 mg by mouth daily.   tamoxifen  (NOLVADEX ) 20 MG tablet Take 1 tablet (20 mg total) by mouth daily.   VAGIFEM  10 MCG TABS vaginal tablet PLACE 1 TABLET VAGINALLY 2 TIMES A WEEK.   venlafaxine  XR (EFFEXOR -XR) 37.5 MG 24 hr capsule TAKE 2 CAPSULES (75 MG TOTAL) BY MOUTH DAILY WITH BREAKFAST.   Vitamin D , Ergocalciferol , (DRISDOL ) 1.25 MG (50000 UNIT) CAPS capsule TAKE 1 CAPSULE (50,000 UNITS TOTAL) BY MOUTH EVERY 7 (SEVEN) DAYS   No facility-administered encounter medications on file as of 07/01/2024.     ONCOLOGIC FAMILY HISTORY:  Family History  Problem Relation Age of Onset   Sleep apnea Mother    Obesity Mother    Heart attack Father 53   Heart Problems Maternal Grandmother    Stomach cancer Maternal Aunt        dx > 50   Throat cancer Maternal Uncle        dx 7s   Stomach cancer Maternal Uncle        dx > 50   Breast cancer Cousin        mother's maternal first cousin     SOCIAL HISTORY:  Social History   Socioeconomic History   Marital status: Single    Spouse name: Not on file   Number of children: Not on file   Years of education: Not on file   Highest education level: Not on file  Occupational History   Not on file  Tobacco Use   Smoking status: Never   Smokeless tobacco: Never  Vaping Use   Vaping status: Never Used  Substance and Sexual Activity   Alcohol  use: Yes    Comment: socially   Drug use: Never   Sexual activity: Yes    Partners: Male    Birth control/protection: Condom    Comment: intercourse age 57, more than 6 sexual parters,   Other Topics Concern   Not on  file  Social History Narrative   Not on file   Social Drivers of Health   Tobacco Use: Low Risk (04/01/2024)   Patient History    Smoking Tobacco Use: Never    Smokeless Tobacco Use: Never    Passive Exposure: Not on file  Financial Resource Strain: Low Risk (07/04/2022)   Overall Financial Resource Strain (CARDIA)    Difficulty of Paying Living Expenses: Not very hard  Food Insecurity: No Food Insecurity (06/28/2022)   Hunger Vital Sign    Worried About Running Out of Food in the Last Year: Never true    Ran  Out of Food in the Last Year: Never true  Transportation Needs: No Transportation Needs (06/28/2022)   PRAPARE - Administrator, Civil Service (Medical): No    Lack of Transportation (Non-Medical): No  Physical Activity: Not on file  Stress: Not on file  Social Connections: Unknown (01/09/2023)   Received from [facility]   Social Connections    Frequency of Communication with Friends and Family: Not asked    Frequency of Social Gatherings with Friends and Family: Not asked  Intimate Partner Violence: Unknown (01/09/2023)   Received from [facility]   Intimate Partner Violence    Fear of Current or Ex-Partner: Not asked    Emotionally Abused: Not asked    Physically Abused: Not asked    Sexually Abused: Not asked  Depression (PHQ2-9): Medium Risk (04/01/2024)   Depression (PHQ2-9)    PHQ-2 Score: 6  Alcohol  Screen: Not on file  Housing: Low Risk (06/28/2022)   Housing    Last Housing Risk Score: 0  Utilities: Not At Risk (06/28/2022)   AHC Utilities    Threatened with loss of utilities: No  Health Literacy: Not on file    OBSERVATIONS/OBJECTIVE:  Physical Exam Constitutional:      Appearance: Normal appearance.  Cardiovascular:     Rate and Rhythm: Normal rate and regular rhythm.     Pulses: Normal pulses.     Heart sounds: Normal heart sounds.  Pulmonary:     Effort: Pulmonary effort is normal.     Breath sounds: Normal breath sounds.  Chest:      Comments: Bilateral breasts inspected. Left breast with post radiation hyperpigmentation. No palpable masses. No regional adenopathy Musculoskeletal:        General: No swelling. Normal range of motion.     Cervical back: Normal range of motion.  Lymphadenopathy:     Cervical: No cervical adenopathy.  Skin:    General: Skin is warm and dry.  Neurological:     General: No focal deficit present.     Mental Status: She is alert.    LABORATORY DATA:  None for this visit.  DIAGNOSTIC IMAGING:  None for this visit.   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  Assessment and Plan Assessment & Plan Near triple neg BC on original biopsy, final path confirmed triple neg BC No evidence of recurrence or acute oncologic symptoms.  - Ordered mammogram for February and instructed scheduling team to contact her. - Refilled tamoxifen  prescription to [facility] and advised her to avoid lapses; instructed her to call for refills as needed. - Scheduled follow-up visit in six months. - Advised against red light therapy due to lack of evidence-based benefit. - MRD testing every 6 months, she will have this scheduled.  Iron  deficiency anemia Anemic with hemoglobin of 8.6 and iron  deficiency.  - Prefers oral iron  therapy over infusion. - Advised initiation of daily oral iron  supplementation (ferrous sulfate, over the counter)  until next follow-up. - Iron  infusion declined per her preference.  Anxiety disorder Ongoing situational anxiety related to cancer recurrence concerns. Not taking Effexor  due to medication lapse. Reports fatigue, sleep disturbance,  - Refilled Effexor  prescription to [facility] and advised against abrupt discontinuation;  instructed her to call for refills as needed. - Encouraged  continued engagement with health coach.  Time spent: 30 min *Total Encounter Time as defined by the Centers for Medicare and Medicaid Services includes, in addition to the face-to-face time of a patient visit (documented in the note above) non-face-to-face time: obtaining and reviewing outside history, ordering and reviewing medications, tests or procedures, care coordination (communications with other health care professionals or caregivers) and documentation in the medical record.  "

## 2024-07-02 ENCOUNTER — Ambulatory Visit: Payer: Self-pay | Admitting: Hematology and Oncology

## 2024-07-03 NOTE — Telephone Encounter (Signed)
-----   Message from Amber Stalls, MD sent at 07/02/2024  8:25 PM EST ----- Iron  stores are still very low. I would strongly encourage oral iron , she didn't want IV iron .  Thanks,

## 2024-07-03 NOTE — Telephone Encounter (Signed)
 Called pt and per MD advised her totake OTC 325 mg Ferrous Sulfate daily for low iron . Pt verbalized understanding.

## 2024-07-08 ENCOUNTER — Encounter: Payer: Self-pay | Admitting: Hematology and Oncology

## 2024-07-23 ENCOUNTER — Encounter

## 2024-08-11 ENCOUNTER — Ambulatory Visit: Attending: Hematology and Oncology

## 2024-12-30 ENCOUNTER — Inpatient Hospital Stay: Admitting: Hematology and Oncology

## 2024-12-30 ENCOUNTER — Inpatient Hospital Stay
